# Patient Record
Sex: Female | Born: 1980 | Race: White | Hispanic: No | Smoking: Former smoker
Health system: Southern US, Community
[De-identification: ages and names within clinical notes are randomized; demographics above are authoritative.]

## PROBLEM LIST (undated history)

## (undated) DIAGNOSIS — M545 Low back pain: Secondary | ICD-10-CM

## (undated) DIAGNOSIS — Z8719 Personal history of other diseases of the digestive system: Secondary | ICD-10-CM

## (undated) DIAGNOSIS — G4730 Sleep apnea, unspecified: Secondary | ICD-10-CM

## (undated) DIAGNOSIS — F419 Anxiety disorder, unspecified: Secondary | ICD-10-CM

## (undated) DIAGNOSIS — E559 Vitamin D deficiency, unspecified: Secondary | ICD-10-CM

## (undated) DIAGNOSIS — Z973 Presence of spectacles and contact lenses: Secondary | ICD-10-CM

## (undated) DIAGNOSIS — R52 Pain, unspecified: Secondary | ICD-10-CM

## (undated) DIAGNOSIS — G510 Bell's palsy: Secondary | ICD-10-CM

## (undated) DIAGNOSIS — I639 Cerebral infarction, unspecified: Secondary | ICD-10-CM

## (undated) DIAGNOSIS — M25561 Pain in right knee: Secondary | ICD-10-CM

## (undated) DIAGNOSIS — F329 Major depressive disorder, single episode, unspecified: Secondary | ICD-10-CM

## (undated) DIAGNOSIS — G4440 Drug-induced headache, not elsewhere classified, not intractable: Secondary | ICD-10-CM

## (undated) DIAGNOSIS — M797 Fibromyalgia: Secondary | ICD-10-CM

## (undated) DIAGNOSIS — F5104 Psychophysiologic insomnia: Secondary | ICD-10-CM

## (undated) DIAGNOSIS — M5432 Sciatica, left side: Secondary | ICD-10-CM

## (undated) DIAGNOSIS — R569 Unspecified convulsions: Secondary | ICD-10-CM

## (undated) DIAGNOSIS — K589 Irritable bowel syndrome without diarrhea: Secondary | ICD-10-CM

## (undated) DIAGNOSIS — K219 Gastro-esophageal reflux disease without esophagitis: Secondary | ICD-10-CM

## (undated) DIAGNOSIS — G43909 Migraine, unspecified, not intractable, without status migrainosus: Secondary | ICD-10-CM

## (undated) DIAGNOSIS — N83201 Unspecified ovarian cyst, right side: Secondary | ICD-10-CM

## (undated) DIAGNOSIS — N6012 Diffuse cystic mastopathy of left breast: Secondary | ICD-10-CM

## (undated) DIAGNOSIS — N6011 Diffuse cystic mastopathy of right breast: Secondary | ICD-10-CM

## (undated) DIAGNOSIS — G43009 Migraine without aura, not intractable, without status migrainosus: Secondary | ICD-10-CM

## (undated) DIAGNOSIS — M1990 Unspecified osteoarthritis, unspecified site: Secondary | ICD-10-CM

## (undated) DIAGNOSIS — G8929 Other chronic pain: Secondary | ICD-10-CM

## (undated) HISTORY — DX: Unspecified osteoarthritis, unspecified site: M19.90

## (undated) HISTORY — PX: ENDOMETRIAL ABLATION: SHX621

## (undated) HISTORY — DX: Vitamin D deficiency, unspecified: E55.9

## (undated) HISTORY — DX: Other chronic pain: G89.29

## (undated) HISTORY — PX: MOUTH BIOPSY: SHX1012

## (undated) HISTORY — DX: Pain, unspecified: R52

## (undated) HISTORY — DX: Anxiety disorder, unspecified: F41.9

## (undated) HISTORY — DX: Migraine, unspecified, not intractable, without status migrainosus: G43.909

## (undated) HISTORY — DX: Low back pain: M54.5

## (undated) HISTORY — DX: Diffuse cystic mastopathy of right breast: N60.11

## (undated) HISTORY — DX: Psychophysiologic insomnia: F51.04

## (undated) HISTORY — DX: Drug-induced headache, not elsewhere classified, not intractable: G44.40

## (undated) HISTORY — PX: TUBAL LIGATION: SHX77

## (undated) HISTORY — DX: Fibromyalgia: M79.7

## (undated) HISTORY — DX: Major depressive disorder, single episode, unspecified: F32.9

## (undated) HISTORY — DX: Migraine without aura, not intractable, without status migrainosus: G43.009

## (undated) HISTORY — PX: MOUTH SURGERY: SHX715

## (undated) HISTORY — DX: Irritable bowel syndrome, unspecified: K58.9

## (undated) HISTORY — DX: Unspecified convulsions: R56.9

## (undated) HISTORY — DX: Unspecified ovarian cyst, right side: N83.201

## (undated) HISTORY — DX: Diffuse cystic mastopathy of left breast: N60.12

## (undated) HISTORY — PX: MULTIPLE TOOTH EXTRACTIONS: SHX2053

---

## 1999-11-02 HISTORY — PX: CHOLECYSTECTOMY: SHX55

## 2000-11-01 HISTORY — PX: ESOPHAGOGASTRODUODENOSCOPY: SHX1529

## 2001-05-09 ENCOUNTER — Encounter: Payer: Self-pay | Admitting: Family Medicine

## 2001-05-09 ENCOUNTER — Ambulatory Visit (HOSPITAL_COMMUNITY): Admission: RE | Admit: 2001-05-09 | Discharge: 2001-05-09 | Payer: Self-pay | Admitting: Family Medicine

## 2002-11-15 ENCOUNTER — Ambulatory Visit (HOSPITAL_COMMUNITY): Admission: EM | Admit: 2002-11-15 | Discharge: 2002-11-15 | Payer: Self-pay | Admitting: Emergency Medicine

## 2003-01-14 ENCOUNTER — Inpatient Hospital Stay (HOSPITAL_COMMUNITY): Admission: RE | Admit: 2003-01-14 | Discharge: 2003-01-16 | Payer: Self-pay | Admitting: Obstetrics and Gynecology

## 2005-02-25 ENCOUNTER — Ambulatory Visit (HOSPITAL_COMMUNITY): Admission: RE | Admit: 2005-02-25 | Discharge: 2005-02-25 | Payer: Self-pay | Admitting: Family Medicine

## 2006-04-14 ENCOUNTER — Emergency Department (HOSPITAL_COMMUNITY): Admission: EM | Admit: 2006-04-14 | Discharge: 2006-04-14 | Payer: Self-pay | Admitting: Emergency Medicine

## 2006-04-26 ENCOUNTER — Encounter (HOSPITAL_COMMUNITY): Admission: RE | Admit: 2006-04-26 | Discharge: 2006-05-26 | Payer: Self-pay | Admitting: Orthopaedic Surgery

## 2006-04-28 ENCOUNTER — Ambulatory Visit (HOSPITAL_COMMUNITY): Admission: RE | Admit: 2006-04-28 | Discharge: 2006-04-28 | Payer: Self-pay | Admitting: Orthopaedic Surgery

## 2006-05-11 ENCOUNTER — Ambulatory Visit (HOSPITAL_COMMUNITY): Admission: RE | Admit: 2006-05-11 | Discharge: 2006-05-11 | Payer: Self-pay | Admitting: *Deleted

## 2006-05-11 ENCOUNTER — Ambulatory Visit (HOSPITAL_COMMUNITY): Admission: RE | Admit: 2006-05-11 | Discharge: 2006-05-11 | Payer: Self-pay | Admitting: Obstetrics and Gynecology

## 2006-12-23 ENCOUNTER — Ambulatory Visit (HOSPITAL_COMMUNITY): Admission: RE | Admit: 2006-12-23 | Discharge: 2006-12-23 | Payer: Self-pay | Admitting: Obstetrics and Gynecology

## 2006-12-23 ENCOUNTER — Encounter (INDEPENDENT_AMBULATORY_CARE_PROVIDER_SITE_OTHER): Payer: Self-pay | Admitting: *Deleted

## 2010-04-08 ENCOUNTER — Other Ambulatory Visit: Admission: RE | Admit: 2010-04-08 | Discharge: 2010-04-08 | Payer: Self-pay | Admitting: Obstetrics and Gynecology

## 2011-03-19 NOTE — H&P (Signed)
   NAME:  Mary Parker, Mary Parker                           ACCOUNT NO.:  [id number]   MEDICAL RECORD NO.:  [id number]                   PATIENT TYPE:  AMB   LOCATION:  DAY                                  FACILITY:  [facility]   PHYSICIAN:  Tilda Burrow, M.D.              DATE OF BIRTH:  06-09-1981   DATE OF ADMISSION:  01/14/2003  DATE OF DISCHARGE:                                HISTORY & PHYSICAL   ADMISSION DIAGNOSES:  1. Pregnancy at 39 weeks' gestation.  2. Prior cesarean section x2 not for trial of labor.  Plan is for repeat     cesarean section with wide-excision of cicatrix.   HISTORY OF PRESENT ILLNESS:  This 30 year old female, G3, P2, with two prior  cesarean sections, is scheduled at this time for repeat cesarean section.  She does not wish to have tubal ligation at this time.  Plans are for a  repeat cesarean with wide-excision of cicatrix on January 14, 2003.  Gestational age is considered 38-5/7 weeks by ultrasound criteria, 40 weeks  by LMP.   PAST MEDICAL HISTORY:  Benign.   PAST SURGICAL HISTORY:  Two prior cesarean sections with spinal anesthesia  without sequelae.   ALLERGIES:  SEASONAL ALLERGIES only.   FAMILY HISTORY:  Positive for hypertension, breast cancer and ovarian  cancer.   HABITS:  Smoked 1 to 1-1/2 packs per day prior to pregnancy, 1/2 pack per  day during pregnancy.  Alcohol and recreational drugs denied.   PHYSICAL EXAMINATION:  VITAL SIGNS:  Height 5 feet 6 inches, weight 183  pounds with a two pound weight gain this pregnancy.  Blood pressure 120/70.  HEENT:  Pupils equal round and reactive, extraocular movements intact.  NECK:  Supple.  CHEST:  Clear to auscultation.  ABDOMEN:  36 cm fundal height with the baby having already dropped.  PELVIC:  Vertex is firmly applied to the lower uterine segment and pelvic  inlet.  Vertex presentation confirmed.  Fetal heart rate 138.  Fetal  movement present.  Cervical exam deferred.   PRENATAL LABORATORY DATA:   Blood type O positive, hemoglobin 12, hematocrit  38.  MSAFP normal.  Rubella immune.  RPR nonreactive.  Urine culture  negative.  Hepatitis and HIV negative.  Glucose tolerance test normal with  MSAFP normal at 1:1500 risk.    ASSESSMENT:  Pregnancy at 38-5/7 weeks.   PLAN:  Repeat cesarean section with wide-excision of cicatrix planned for  January 14, 2003.                                               Tilda Burrow, M.D.    JVF/MEDQ  D:  01/11/2003  T:  01/11/2003  Job:  [id number]

## 2011-03-19 NOTE — Op Note (Signed)
NAME:  Tenpas, DARIYAH GARDUNO                           ACCOUNT NO.:  [id number]   MEDICAL RECORD NO.:  [id number]                   PATIENT TYPE:  AMB   LOCATION:  DAY                                  FACILITY:  [facility]   PHYSICIAN:  Tilda Burrow, M.D.              DATE OF BIRTH:  Mar 28, 1981   DATE OF PROCEDURE:  DATE OF DISCHARGE:                                 OPERATIVE REPORT   PREOPERATIVE DIAGNOSES:  Intrauterine pregnancy 39 weeks, prior C-section  after trial of labor.   POSTOPERATIVE DIAGNOSES:  Intrauterine pregnancy 39 weeks, prior C-section  after trial of labor.   PROCEDURE:  1. Repeat low transverse cervical cesarean section.  2. Wide excision of cicatrix.   SURGEON:  Tilda Burrow, M.D.   ASSISTANT:  Ms. Angela Cox   ANESTHESIA:  Spinal.   COMPLICATIONS:  Inadvertent needle stick to assistant during subcu closure.   ESTIMATED BLOOD LOSS:  300 mL   FINDINGS:  Health female, Apgars 9 and 9, 7 pounds 4-1/2 ounces.   DESCRIPTION OF PROCEDURE:  The patient was taken to the operating room and  prepped and draped for abdominal procedure after spinal anesthesia  introduced and Foley catheter inserted.  A Pfannenstiel-type incision was  repeated, widely excising the old cicatrix,  removing it with approximately  a 20 cm ellipse of skin and cicatrix with underlying subcu fatty tissue.  Opening the fascia in standard Pfannenstiel technique was then performed  with easy entry of the abdomen through the midline.  A bladder flap was  developed on the lower uterine segment and a very thin area in the lower  uterine segment identified and opened, less than 5 mm thickness to the lower  uterine segment.  The infant was delivered easily using fundal pressure and  manual guidance of the vertex.  The infant was delivered and bulb suctioning  then performed.  The baby cried even before the abdomen was delivered.  Amniotic fluid was clear without malodor.  The infant was then  delivered to  the nurse in attendance for monitoring, Apgars of 9 and 9 were signed.  The  placenta was delivered easily after cord blood samples obtained and the  uterus irrigated with antibiotic solution.  A single layer of running  locking incision was then performed, followed by bladder flap  reapproximation with 2-0 chromic.  The anterior peritoneum was  reapproximated using 2-0 chromic.  The rectus muscles were pulled into the  midline with a single interrupted suture of 2-0 chromic over the bladder  flap area.  The fascia was trimmed of some lax fibrosis on the upper aspects  of the incision and this was discarded as well.  The rest of the fascia was  pulled together in the midline using continuous running #0 Vicryl with good  tissue approximation.  Subcutaneous tissues were then pulled together using  2-0 plain interrupted sutures.  Unfortunately during  the closure during one  of the stitches, after I had gone through the patient's tissue, then the  needle was exposed, Ms. Montolongo moved in such a way to have a very  superficial stick to the tip of her finger.  The glove was removed.  Needle  discarded, and no contamination of patient occurred.  Inspection of Ms.  Montolongo's finger tip indicated a very very tiny droplet of blood.  Standard needle stick precautions were initiated.  We then continued with  the procedure after reapproximating the fatty tissues with interrupted 2-0  plain and then staple closure of the skin completed the procedure.  The  patient tolerated the procedure well and went to recovery room in excellent  condition.  Estimated blood loss 300 cc.                                               Tilda Burrow, M.D.    JVF/MEDQ  D:  01/14/2003  T:  01/14/2003  Job:  [id number]

## 2011-03-19 NOTE — Discharge Summary (Signed)
   NAME:  Mary Parker, Mary Parker                           ACCOUNT NO.:  [id number]   MEDICAL RECORD NO.:  [id number]                   PATIENT TYPE:  INP   LOCATION:  A428                                 FACILITY:  [facility]   PHYSICIAN:  Tilda Burrow, M.D.              DATE OF BIRTH:  August 26, 1981   DATE OF ADMISSION:  01/14/2003  DATE OF DISCHARGE:  01/16/2003                                 DISCHARGE SUMMARY   ADMISSION DIAGNOSIS:  1. Intrauterine pregnancy at [redacted] weeks gestation.  2. Prior cesarean section x2, not for trial of labor.   DISCHARGE DIAGNOSES:  1. Pregnancy at 39 weeks, delivered.  2. Prior cesarean section x2, not for trial of labor.   PROCEDURE:  On January 14, 2003 repeat low transverse cervical cesarean  section with excision of old cicatrix.   FINDINGS:  Healthy female infant, Apgars 8 and 9, 7 pounds 4.5 ounces.   DISCHARGE INSTRUCTIONS:  Routine post C section instructions.   DISCHARGE MEDICATIONS:  1. Tylox one to two q.4h. p.r.n. pain.  2. Motrin 200 mg two q.4h. p.r.n. mild pain.   FOLLOW-UP:  My office for postpartum care.   HISTORY OF PRESENT ILLNESS:  This 30 year old gravida 3 para 2 was scheduled  for a repeat cesarean section.  She does not wish to have tubal ligation.  Plans are for repeat cesarean section with excision of cicatrix.   PAST MEDICAL HISTORY:  Benign.   SURGICAL HISTORY:  Two prior C sections with spinal anesthesia.   PHYSICAL EXAMINATION:  Height 5 feet 6 inches, weight 183 pounds.  Blood  pressure 120/70.  Fundal height 36 cm.    HOSPITAL COURSE:  The hospital course was uneventful.  The patient was  stable on postoperative day #1, was planned for discharge on postoperative  day #2.  Discharge hemoglobin was 11.8, hematocrit 34.5, compared to 11.9  and 34.3 on admission.  Maternal blood type is  O positive.  Follow up will be in five days at our office for staple  removal.                                               Tilda Burrow, M.D.    JVF/MEDQ  D:  03/11/2003  T:  03/12/2003  Job:  [id number]

## 2011-03-19 NOTE — Op Note (Signed)
NAME:  Mary Parker, Mary Parker                 ACCOUNT NO.:  [id number]   MEDICAL RECORD NO.:  [id number]          PATIENT TYPE:  AMB   LOCATION:  DAY                           FACILITY:  [facility]   PHYSICIAN:  Tilda Burrow, M.D. DATE OF BIRTH:  09/29/1981   DATE OF PROCEDURE:  12/23/2006  DATE OF DISCHARGE:                               OPERATIVE REPORT   PREOPERATIVE DIAGNOSIS:  Elective sterilization, menorrhagia.   POSTOPERATIVE DIAGNOSIS:  Elective sterilization, menorrhagia.   PROCEDURE:  Hysteroscopy, dilation and curettage, endometrial ablation,  laparoscopic tubal sterilization with Falope rings.   INDICATIONS FOR PROCEDURE:  Patient with heavy menses desiring  resolution of her menstrual discomfort.   DETAILS OF PROCEDURE:  The patient was taken to the operating room,  prepped and draped in the usual fashion for combined abdominal and  vaginal procedure with low lithotomy leg support.  The cervix was  grasped and sounded to 9 cm and the cervix dilated to 27 [place] allowing  introduction of the 30 degree operative hysteroscope without difficulty.  The uterus was visualized and the endometrial cavity showed a very thin  lining.  A sharp curettage was performed in all quadrants which obtained  very, very little tissue.  Repeat hysteroscopy was performed confirming  that there was no significant loose debris in the uterine cavity and  that the lining of the uterus was thin.  It was noted during the  procedure that the uterus was quite flexible and easily distended.  It  appeared that the distention was greater than the normal amount of  distention for this procedure.  We then proceeded with preparation of  the Gynecare ThermaChoice III endometrial ablation balloon which was  inserted to the appropriate depth, 9 cm, and then dilated with 38 mL of  saline.  We had technical challenges getting the uterine tone to remain  strong, the uterus pressure kept decreasing as the uterus stretched  and  relaxed further and further.  We tried some IV oxytocin infusion to see  if that would help and it raised the uterine tone pressure by about 5  mmHg.  We then finally reached 150 mmHg pressure at 38 mL.  The  temperature sequence was initiated, heating element reaching 87 degrees  Centigrade, and thermal ablation sequence activated and processed.  We  then retrieved all 38 mL of fluid and placed a paracervical block with  0.5% Marcaine with epinephrine x20 mL.   The Hulka tenaculum was attached to the cervix and fallopian tube  sterilization performed.  An infraumbilical vertical 1 cm skin incision  was made as well as a transverse suprapubic incision.  The Veress needle  was introduced through the umbilicus being careful to orient toward the  pelvis while elevating the abdominal wall.  Pneumoperitoneum was easily  achieved.  The 5 mm laparoscopic trocar was introduced and inspection of  the pelvis revealed normal pelvic anatomy.  The uterus could be  manipulated sufficiently to grasp the tubes.  Each one had a Falope ring  applied at its mid portion and then percutaneous Marcaine solution  injected into  the tubal incarcerated portion and into the broad ligament  beneath the Falope ring.  The procedure was successfully completed, the  abdomen deflated with saline instilled in the abdomen x60 mL, removal of  the laparoscopic instruments from the suprapubic port and the umbilical  port, and then we allowed the patient to go to the recovery room in  stable condition.  Sponge and needle counts were correct.      Tilda Burrow, M.D.  Electronically Signed     JVF/MEDQ  D:  12/28/2006  T:  12/28/2006  Job:  [id number]

## 2011-03-19 NOTE — H&P (Signed)
NAME:  Mary Parker, Mary Parker                 ACCOUNT NO.:  [id number]   MEDICAL RECORD NO.:  [id number]          PATIENT TYPE:  AMB   LOCATION:  DAY                           FACILITY:  [facility]   PHYSICIAN:  Tilda Burrow, M.D. DATE OF BIRTH:  1981-08-18   DATE OF ADMISSION:  DATE OF DISCHARGE:  LH                              HISTORY & PHYSICAL   HISTORY AND PHYSICAL   This 30 year old female, gravida 3, para 3, status post C-section times  two, last menstrual period December 19, 2006 desires hysteroscopy,  dilatation and curettage and endometrial ablation to resolve persistent  heavy menses that are debilitating the patient.  She has been seen in  our office, discussed options of resolution of her heavy menses.  She  discussed a consideration of hysterectomy because of a family history of  cancer of the cervix in both the grandmother and an aunt, but she has a  long history of completely normal Pap smears.  She has menses that last  3 days with clots that she misses 1-2 days due to the cramping.  She has  no premenstrual cramps, they are all during the labor itself.  She has  had three C-sections and has a relatively tiny cervix.  It is felt that  the discomfort is related to the menstrual flow through the tight  undeveloped cervix that has never delivered an infant.  Ultrasound has  been performed, which shows the anteflexed uterus with fluid that  remains in the uterus at the end of the menses consistent with a tight  cervix.  Therefore the plans are to proceed with endometrial ablation  for resolution of her undesired heavy bleeding.  Also, plans are for  permanent sterilization at the time to eliminate the risk of ectopic  pregnancy or intrauterine pregnancy.   PAST MEDICAL HISTORY:  Benign.   PHYSICAL EXAMINATION:  Height 5-6, weight 199.  Blood pressure 114/76.  Pupils equal, round and reactive.  Neck supple.  Chest clear to  auscultation.  Cardiovascular exam unremarkable.  Breast  exam deferred.  Abdomen:  Well-healed lower abdominal scars from C-sections.  External  genitalia normal.  Nulliparous vaginal exam, normal, tiny cervix never  delivered vaginally.  Uterus anteflexed, small, with ultrasound  confirmation of uterine length of approximately 7-1/2 cm, anteflexed  uterus 4.5 x 3.4 cm.  Adnexae without masses or ultrasound-identified  masses.   PLAN:  Hysteroscopy, dilatation and curettage, endometrial ablation  followed by tubal ligation on December 23, 2006.      Tilda Burrow, M.D.  Electronically Signed     JVF/MEDQ  D:  12/23/2006  T:  12/23/2006  Job:  [id number]   cc:   Treating OB/GYN

## 2014-11-18 ENCOUNTER — Other Ambulatory Visit: Payer: Self-pay | Admitting: Adult Health

## 2014-11-28 ENCOUNTER — Other Ambulatory Visit: Payer: Self-pay | Admitting: Adult Health

## 2014-12-11 ENCOUNTER — Encounter: Payer: Self-pay | Admitting: Adult Health

## 2014-12-11 ENCOUNTER — Ambulatory Visit (INDEPENDENT_AMBULATORY_CARE_PROVIDER_SITE_OTHER): Payer: BLUE CROSS/BLUE SHIELD | Admitting: Adult Health

## 2014-12-11 ENCOUNTER — Other Ambulatory Visit (HOSPITAL_COMMUNITY)
Admission: RE | Admit: 2014-12-11 | Discharge: 2014-12-11 | Disposition: A | Discharge status: Home / Self Care | Payer: BLUE CROSS/BLUE SHIELD | Source: Ambulatory Visit | Attending: Adult Health | Admitting: Adult Health

## 2014-12-11 VITALS — BP 110/78 | HR 80 | Ht 63.0 in | Wt 170.5 lb

## 2014-12-11 DIAGNOSIS — R52 Pain, unspecified: Secondary | ICD-10-CM

## 2014-12-11 DIAGNOSIS — Z1151 Encounter for screening for human papillomavirus (HPV): Secondary | ICD-10-CM | POA: Insufficient documentation

## 2014-12-11 DIAGNOSIS — Z01419 Encounter for gynecological examination (general) (routine) without abnormal findings: Secondary | ICD-10-CM | POA: Diagnosis present

## 2014-12-11 DIAGNOSIS — F329 Major depressive disorder, single episode, unspecified: Secondary | ICD-10-CM | POA: Insufficient documentation

## 2014-12-11 DIAGNOSIS — F32A Depression, unspecified: Secondary | ICD-10-CM

## 2014-12-11 HISTORY — DX: Pain, unspecified: R52

## 2014-12-11 HISTORY — DX: Depression, unspecified: F32.A

## 2014-12-11 MED ORDER — SERTRALINE HCL 50 MG PO TABS: 50.0000 mg | ORAL_TABLET | Freq: Every day | ORAL | Status: DC
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
## 2014-12-11 NOTE — Patient Instructions (Signed)

## 2014-12-13 LAB — CYTOLOGY - PAP

## 2014-12-19 ENCOUNTER — Encounter: Payer: Self-pay | Admitting: Neurology

## 2014-12-19 ENCOUNTER — Ambulatory Visit (INDEPENDENT_AMBULATORY_CARE_PROVIDER_SITE_OTHER): Payer: BLUE CROSS/BLUE SHIELD | Admitting: Neurology

## 2014-12-19 VITALS — BP 104/70 | HR 75 | Ht 63.0 in | Wt 170.0 lb

## 2014-12-19 DIAGNOSIS — G43109 Migraine with aura, not intractable, without status migrainosus: Secondary | ICD-10-CM | POA: Insufficient documentation

## 2014-12-19 DIAGNOSIS — M797 Fibromyalgia: Secondary | ICD-10-CM

## 2014-12-19 DIAGNOSIS — G4440 Drug-induced headache, not elsewhere classified, not intractable: Secondary | ICD-10-CM

## 2014-12-19 DIAGNOSIS — G43009 Migraine without aura, not intractable, without status migrainosus: Secondary | ICD-10-CM

## 2014-12-19 DIAGNOSIS — G43019 Migraine without aura, intractable, without status migrainosus: Secondary | ICD-10-CM

## 2014-12-19 HISTORY — DX: Fibromyalgia: M79.7

## 2014-12-19 HISTORY — DX: Drug-induced headache, not elsewhere classified, not intractable: G44.40

## 2014-12-19 HISTORY — DX: Migraine without aura, not intractable, without status migrainosus: G43.009

## 2014-12-19 MED ORDER — NORTRIPTYLINE HCL 10 MG PO CAPS: ORAL_CAPSULE | ORAL | Status: DC
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
## 2014-12-19 NOTE — Patient Instructions (Signed)
  Pamelor (nortriptyline) is an antidepressant medication that has many uses that may include headache, whiplash injuries, or for peripheral neuropathy pain. Side effects may include drowsiness, dry mouth, blurred vision, or constipation. As with any antidepressant medication, worsening depression may occur. If you had any significant side effects, please call our office. The full effects of this medication may take 7-10 days after starting the drug, or going up on the dose.  Fibromyalgia Fibromyalgia is a disorder that is often misunderstood. It is associated with muscular pains and tenderness that comes and goes. It is often associated with fatigue and sleep disturbances. Though it tends to be long-lasting, fibromyalgia is not life-threatening. CAUSES  The exact cause of fibromyalgia is unknown. People with certain gene types are predisposed to developing fibromyalgia and other conditions. Certain factors can play a role as triggers, such as:  Spine disorders.  Arthritis.  Severe injury (trauma) and other physical stressors.  Emotional stressors. SYMPTOMS   The main symptom is pain and stiffness in the muscles and joints, which can vary over time.  Sleep and fatigue problems. Other related symptoms may include:  Bowel and bladder problems.  Headaches.  Visual problems.  Problems with odors and noises.  Depression or mood changes.  Painful periods (dysmenorrhea).  Dryness of the skin or eyes. DIAGNOSIS  There are no specific tests for diagnosing fibromyalgia. Patients can be diagnosed accurately from the specific symptoms they have. The diagnosis is made by determining that nothing else is causing the problems. TREATMENT  There is no cure. Management includes medicines and an active, healthy lifestyle. The goal is to enhance physical fitness, decrease pain, and improve sleep. HOME CARE INSTRUCTIONS   Only take over-the-counter or prescription medicines as directed by your  caregiver. Sleeping pills, tranquilizers, and pain medicines may make your problems worse.  Low-impact aerobic exercise is very important and advised for treatment. At first, it may seem to make pain worse. Gradually increasing your tolerance will overcome this feeling.  Learning relaxation techniques and how to control stress will help you. Biofeedback, visual imagery, hypnosis, muscle relaxation, yoga, and meditation are all options.  Anti-inflammatory medicines and physical therapy may provide short-term help.  Acupuncture or massage treatments may help.  Take muscle relaxant medicines as suggested by your caregiver.  Avoid stressful situations.  Plan a healthy lifestyle. This includes your diet, sleep, rest, exercise, and friends.  Find and practice a hobby you enjoy.  Join a fibromyalgia support group for interaction, ideas, and sharing advice. This may be helpful. SEEK MEDICAL CARE IF:  You are not having good results or improvement from your treatment. FOR MORE INFORMATION  National Fibromyalgia Association: www.fmaware.org Arthritis Foundation: www.arthritis.org Document Released: 10/18/2005 Document Revised: 01/10/2012 Document Reviewed: 01/28/2010 [facility] Patient Information 2015 [employer], [place]. This information is not intended to replace advice given to you by your health care provider. Make sure you discuss any questions you have with your health care provider.

## 2014-12-20 ENCOUNTER — Telehealth: Payer: Self-pay | Admitting: Neurology

## 2014-12-20 NOTE — Telephone Encounter (Signed)
I called the patient again. The home number listed is incorrect, and the patient does not answer the cell number. I will try again later, but we may need to send her a letter.

## 2014-12-20 NOTE — Telephone Encounter (Signed)
Try to call the patient, no answer. Vitamin B12 level is low, will try to have the patient come in for a B12 shot, go on oral supplementation.

## 2014-12-20 NOTE — Telephone Encounter (Signed)
I called the patient again, unable to reach her, I will call later.

## 2014-12-21 LAB — SPECIMEN STATUS REPORT

## 2014-12-21 LAB — RPR: RPR: NONREACTIVE

## 2014-12-21 LAB — SEDIMENTATION RATE: Sed Rate: 4 mm/hr (ref 0–32)

## 2014-12-21 LAB — VITAMIN B12: Vitamin B-12: 161 pg/mL — ABNORMAL LOW (ref 211–946)

## 2014-12-21 LAB — COPPER, SERUM: Copper: 104 ug/dL (ref 72–166)

## 2014-12-21 LAB — METHYLMALONIC ACID, SERUM: Methylmalonic Acid: 164 nmol/L (ref 0–378)

## 2014-12-23 ENCOUNTER — Encounter: Payer: Self-pay | Admitting: Neurology

## 2014-12-23 ENCOUNTER — Telehealth: Payer: Self-pay | Admitting: Neurology

## 2014-12-23 NOTE — Telephone Encounter (Signed)
Called patient again, still unable to contact her. I will write a letter. Also, will she has been contacted several times trying to set up the MRI study, unable to reach her.

## 2014-12-23 NOTE — Telephone Encounter (Signed)
I have made two attempts to contact this patient to schedule MRI and her voicemail is not set up

## 2014-12-30 ENCOUNTER — Telehealth: Payer: Self-pay | Admitting: *Deleted

## 2014-12-30 NOTE — Telephone Encounter (Signed)
I have tried to call patient but the voice mail has not been set up yet.

## 2014-12-30 NOTE — Telephone Encounter (Signed)
Patient wants to know how many B12 injections is she going to need and she also would like to know if this is something her PCP can give. This would be cheaper and they have late hours on Tuesdays and Thursday. Please call the patient

## 2014-12-31 ENCOUNTER — Telehealth: Payer: Self-pay | Admitting: Neurology

## 2014-12-31 ENCOUNTER — Telehealth: Payer: Self-pay | Admitting: *Deleted

## 2014-12-31 NOTE — Telephone Encounter (Signed)
Patient is returning your call about B12.  She gets off work at 5:00.  Please call back.

## 2014-12-31 NOTE — Telephone Encounter (Signed)
Has stopped zoloft per A. Creta LevinStallings, I tried calling no voice mail

## 2014-12-31 NOTE — Telephone Encounter (Signed)
I  have tried to call the patient again and was unable to leave a message.  Please relay that she can go to her PCP to get a B12 injection and then she can take an over the counter B12 oral supplement.

## 2015-01-07 ENCOUNTER — Ambulatory Visit: Payer: BLUE CROSS/BLUE SHIELD | Admitting: Adult Health

## 2015-01-08 ENCOUNTER — Ambulatory Visit (INDEPENDENT_AMBULATORY_CARE_PROVIDER_SITE_OTHER): Payer: BLUE CROSS/BLUE SHIELD

## 2015-01-08 DIAGNOSIS — G43019 Migraine without aura, intractable, without status migrainosus: Secondary | ICD-10-CM

## 2015-01-08 DIAGNOSIS — M797 Fibromyalgia: Secondary | ICD-10-CM

## 2015-01-09 ENCOUNTER — Telehealth: Payer: Self-pay | Admitting: Neurology

## 2015-01-09 NOTE — Telephone Encounter (Signed)
    MRI brain results 01/09/2015:  IMPRESSION:  Normal MRI brain (without).

## 2015-01-09 NOTE — Telephone Encounter (Signed)
I tried to call the patient. Unable to reach, I will call back tomorrow.

## 2015-01-10 MED ORDER — NORTRIPTYLINE HCL 25 MG PO CAPS: 50.0000 mg | ORAL_CAPSULE | Freq: Every day | ORAL | Status: DC

## 2015-01-10 NOTE — Telephone Encounter (Signed)
I called the patient. MRI the brain was unremarkable. The patient is to go to her primary care doctor to get the B12 shot, and then go on vitamin B12 tablets 1000 g daily. She is still having lots of problems with headache, she is on 30 mg of nortriptyline at night, tolerating the medication well. I will call in a 25 mg tablet, we will start at 50 mg at night.

## 2015-01-14 ENCOUNTER — Telehealth: Payer: Self-pay | Admitting: Neurology

## 2015-01-14 MED ORDER — GABAPENTIN 300 MG PO CAPS: ORAL_CAPSULE | ORAL | Status: DC

## 2015-01-14 NOTE — Telephone Encounter (Signed)
I called patient. She is not tolerating the nortriptyline secondary to reflux type symptoms. We will switch her over to gabapentin for the fibromyalgia and for the headaches.

## 2015-01-14 NOTE — Telephone Encounter (Signed)
Patient is calling in regard to Rx Nortriptyline 25 mg.  Patient needs a medication that will allow her to be in sunlight and Rx is not really helping her that much...today she has had a lot of pain.  The higher dosage is making her burp and burn up her nose. Can another Rx be prescribed please.  She can also be paged at work [phone].

## 2015-03-06 ENCOUNTER — Telehealth: Payer: Self-pay | Admitting: Neurology

## 2015-03-06 MED ORDER — GABAPENTIN 600 MG PO TABS: 600.0000 mg | ORAL_TABLET | Freq: Three times a day (TID) | ORAL | Status: DC

## 2015-03-06 NOTE — Telephone Encounter (Signed)
Patient is calling because the gabapentin (NEURONTIN) 300 MG capsule is not helping with the pain in her back but is helping with her legs. What can be done to help with the back pain? The patient also wants to know if she can go back on the anti depressant medication Zoloft. Please call patient to discuss. Thank you.

## 2015-03-06 NOTE — Telephone Encounter (Signed)
I called the patient. The patient is on 900 mg daily of gabapentin, she is tolerating the medication well. We will go up to 600 mg twice daily for 2 weeks, then go to 600 mg 3 times daily. Okay for her to go back on the Zoloft.

## 2015-03-10 ENCOUNTER — Telehealth: Payer: Self-pay | Admitting: Adult Health

## 2015-03-10 NOTE — Telephone Encounter (Signed)
Left message x 1. JSY 

## 2015-03-12 NOTE — Telephone Encounter (Signed)
Left message x 2. JSY 

## 2015-03-13 NOTE — Telephone Encounter (Signed)
Left message x 3. JSY 

## 2015-03-17 NOTE — Telephone Encounter (Signed)
3 messages left and never heard from pt. Encounter closed. JSY

## 2015-03-19 ENCOUNTER — Telehealth: Payer: Self-pay | Admitting: *Deleted

## 2015-03-19 NOTE — Telephone Encounter (Signed)
LMVM for pt to call back re: rescheduling appt from 04-30-15 0830 to another time.

## 2015-03-20 NOTE — Telephone Encounter (Signed)
LMVM for pt to rescheduling appt.

## 2015-03-21 ENCOUNTER — Emergency Department (HOSPITAL_COMMUNITY)
Admission: EM | Admit: 2015-03-21 | Discharge: 2015-03-21 | Disposition: A | Discharge status: Home / Self Care | Payer: BLUE CROSS/BLUE SHIELD | Attending: Emergency Medicine | Admitting: Emergency Medicine

## 2015-03-21 ENCOUNTER — Encounter: Payer: Self-pay | Admitting: *Deleted

## 2015-03-21 ENCOUNTER — Encounter (HOSPITAL_COMMUNITY): Payer: Self-pay | Admitting: Emergency Medicine

## 2015-03-21 ENCOUNTER — Emergency Department (HOSPITAL_COMMUNITY): Payer: BLUE CROSS/BLUE SHIELD

## 2015-03-21 DIAGNOSIS — T50995A Adverse effect of other drugs, medicaments and biological substances, initial encounter: Secondary | ICD-10-CM | POA: Diagnosis not present

## 2015-03-21 DIAGNOSIS — Z79899 Other long term (current) drug therapy: Secondary | ICD-10-CM | POA: Diagnosis not present

## 2015-03-21 DIAGNOSIS — Z8739 Personal history of other diseases of the musculoskeletal system and connective tissue: Secondary | ICD-10-CM | POA: Insufficient documentation

## 2015-03-21 DIAGNOSIS — Z87448 Personal history of other diseases of urinary system: Secondary | ICD-10-CM | POA: Insufficient documentation

## 2015-03-21 DIAGNOSIS — F329 Major depressive disorder, single episode, unspecified: Secondary | ICD-10-CM | POA: Insufficient documentation

## 2015-03-21 DIAGNOSIS — R51 Headache: Secondary | ICD-10-CM | POA: Diagnosis present

## 2015-03-21 DIAGNOSIS — F419 Anxiety disorder, unspecified: Secondary | ICD-10-CM | POA: Insufficient documentation

## 2015-03-21 DIAGNOSIS — T50905A Adverse effect of unspecified drugs, medicaments and biological substances, initial encounter: Secondary | ICD-10-CM

## 2015-03-21 DIAGNOSIS — E559 Vitamin D deficiency, unspecified: Secondary | ICD-10-CM | POA: Diagnosis not present

## 2015-03-21 DIAGNOSIS — Z8719 Personal history of other diseases of the digestive system: Secondary | ICD-10-CM | POA: Diagnosis not present

## 2015-03-21 DIAGNOSIS — Z87891 Personal history of nicotine dependence: Secondary | ICD-10-CM | POA: Diagnosis not present

## 2015-03-21 DIAGNOSIS — G43911 Migraine, unspecified, intractable, with status migrainosus: Secondary | ICD-10-CM | POA: Insufficient documentation

## 2015-03-21 DIAGNOSIS — R519 Headache, unspecified: Secondary | ICD-10-CM

## 2015-03-21 LAB — BASIC METABOLIC PANEL
Anion gap: 4 — ABNORMAL LOW (ref 5–15)
BUN: 9 mg/dL (ref 6–20)
CALCIUM: 8.3 mg/dL — AB (ref 8.9–10.3)
CO2: 29 mmol/L (ref 22–32)
CREATININE: 0.81 mg/dL (ref 0.44–1.00)
Chloride: 108 mmol/L (ref 101–111)
GFR calc Af Amer: >60 mL/min (ref >60)
GFR calc non Af Amer: >60 mL/min (ref >60)
GLUCOSE: 85 mg/dL (ref 65–99)
Potassium: 3.6 mmol/L (ref 3.5–5.1)
SODIUM: 141 mmol/L (ref 135–145)

## 2015-03-21 LAB — CBC WITH DIFFERENTIAL/PLATELET
Basophils Absolute: 0 10*3/uL (ref 0.0–0.1)
Basophils Relative: 0 % (ref 0–1)
EOS ABS: 0.1 10*3/uL (ref 0.0–0.7)
EOS PCT: 2 % (ref 0–5)
HCT: 40.5 % (ref 36.0–46.0)
Hemoglobin: 13 g/dL (ref 12.0–15.0)
Lymphocytes Relative: 24 % (ref 12–46)
Lymphs Abs: 1.6 10*3/uL (ref 0.7–4.0)
MCH: 30.2 pg (ref 26.0–34.0)
MCHC: 32.1 g/dL (ref 30.0–36.0)
MCV: 94 fL (ref 78.0–100.0)
MONO ABS: 0.4 10*3/uL (ref 0.1–1.0)
Monocytes Relative: 6 % (ref 3–12)
NEUTROS PCT: 68 % (ref 43–77)
Neutro Abs: 4.7 10*3/uL (ref 1.7–7.7)
PLATELETS: 172 10*3/uL (ref 150–400)
RBC: 4.31 MIL/uL (ref 3.87–5.11)
RDW: 12.4 % (ref 11.5–15.5)
WBC: 6.8 10*3/uL (ref 4.0–10.5)

## 2015-03-21 MED ORDER — SODIUM CHLORIDE 0.9 % IV SOLN
INTRAVENOUS | Status: DC
  Administered 2015-03-21: 20:00:00 via INTRAVENOUS

## 2015-03-21 MED ORDER — PROMETHAZINE HCL 25 MG/ML IJ SOLN
12.5000 mg | Freq: Four times a day (QID) | INTRAMUSCULAR | Status: DC | PRN
  Administered 2015-03-21: 12.5 mg via INTRAVENOUS
  Filled 2015-03-21: qty 1

## 2015-03-21 MED ORDER — SODIUM CHLORIDE 0.9 % IV BOLUS (SEPSIS)
1000.0000 mL | Freq: Once | INTRAVENOUS | Status: AC
  Administered 2015-03-21: 1000 mL via INTRAVENOUS

## 2015-03-21 MED ORDER — DIPHENHYDRAMINE HCL 50 MG/ML IJ SOLN
25.0000 mg | Freq: Once | INTRAMUSCULAR | Status: AC
  Administered 2015-03-21: 25 mg via INTRAVENOUS
  Filled 2015-03-21: qty 1

## 2015-03-21 MED ORDER — KETOROLAC TROMETHAMINE 30 MG/ML IJ SOLN
30.0000 mg | Freq: Once | INTRAMUSCULAR | Status: AC
  Administered 2015-03-21: 30 mg via INTRAVENOUS
  Filled 2015-03-21: qty 1

## 2015-03-21 MED ORDER — DEXAMETHASONE SODIUM PHOSPHATE 4 MG/ML IJ SOLN
10.0000 mg | Freq: Once | INTRAMUSCULAR | Status: AC
Start: 2015-03-21 — End: 2015-03-21
  Administered 2015-03-21: 10 mg via INTRAVENOUS
  Filled 2015-03-21: qty 3

## 2015-03-21 NOTE — Discharge Instructions (Signed)
Recommend going home and sleeping. Return for any new or worse symptoms. If headache does not resolve over the next couple days follow back up with your primary care doctor. May need referral to neurology. Some information provided above.

## 2015-03-21 NOTE — ED Notes (Signed)
Pt reports headache,nausea x4 days. Pt reports family reports pt had "seziure" x2 days ago. Pt reports recently increased dose of chantix when symptoms began. nad noted. Pt reports light and sound sensitivity.

## 2015-03-21 NOTE — Telephone Encounter (Signed)
LMVM for pt that will cancel appt on 04-30-15 at 0830 due to schedule change.  Please call back to reschedule.  Will mail letter as well.  Done.

## 2015-03-21 NOTE — ED Notes (Signed)
ED Provider at patient bedside for assessment/update.   

## 2015-03-21 NOTE — ED Provider Notes (Addendum)
CSN: [id number]     Arrival date & time 03/21/15  1828 History   First MD Initiated Contact with Patient 03/21/15 1839     Chief Complaint  Patient presents with  . Headache     (Consider location/radiation/quality/duration/timing/severity/associated sxs/prior Treatment) Patient is a 34 y.o. female presenting with headaches. The history is provided by the patient.  Headache Associated symptoms: myalgias, nausea, photophobia, seizures and vomiting   Associated symptoms: no congestion, no diarrhea, no dizziness, no fever, no neck pain, no numbness and no weakness    patient presents with a complaint of 6 days of nausea and vomiting headache migraine-like, this headache is been present for 4 days. Patient does have a history of migraines. However this one is on the right side when normally her migraines are on the left. Associated with nausea and vomiting and some photophobia. No fevers. Patient was on Chantix that was stopped 3 days ago because they felt that the the symptoms may be related to side effects of the medicine. 2 days ago patient had seizure-like activity without loss of consciousness. Had shaking all over. No incontinence. Patient also has a history of fibromyalgia. Patient's headache is 10 out of 10. Predominantly right-sided. No fevers no neck stiffness.  Past Medical History  Diagnosis Date  . Anxiety   . Migraines   . Fibrocystic disease of both breasts   . Arthritis     both knees  . Depression 12/11/2014  . Body aches 12/11/2014  . Common migraine 12/19/2014  . Fibromyalgia 12/19/2014  . IBS (irritable bowel syndrome)   . Vitamin D deficiency   . Chronic insomnia   . Rebound headache 12/19/2014   Past Surgical History  Procedure Laterality Date  . Cesarean section  [phone]  . Cholecystectomy  2001  . Tubal ligation    . Endometrial ablation     Family History  Problem Relation Age of Onset  . Hypertension Mother   . Other Mother     abnormal cells; had  hyst  . Obesity Daughter   . Obesity Son   . Cancer Maternal Grandmother   . Osteoporosis Maternal Grandmother   . Cancer Maternal Grandfather   . Obesity Daughter   . Diabetes Father   . Hypertension Father   . Alcohol abuse Father    History  Substance Use Topics  . Smoking status: Former Smoker -- 0.00 packs/day for 0 years    Types: Cigarettes  . Smokeless tobacco: Never Used  . Alcohol Use: No   OB History    Gravida Para Term Preterm AB TAB SAB Ectopic Multiple Living   Review of Systems  Constitutional: Negative for fever.  HENT: Negative for congestion.   Eyes: Positive for photophobia.  Respiratory: Negative for shortness of breath.   Cardiovascular: Negative for chest pain.  Gastrointestinal: Positive for nausea and vomiting. Negative for diarrhea and abdominal distention.  Genitourinary: Negative for dysuria.  Musculoskeletal: Positive for myalgias. Negative for neck pain.  Skin: Negative for rash.  Neurological: Positive for seizures and headaches. Negative for dizziness, speech difficulty, weakness and numbness.  Hematological: Does not bruise/bleed easily.  Psychiatric/Behavioral: Negative for confusion.      Allergies  Ciprofloxacin  Home Medications   Prior to Admission medications   Medication Sig Start Date End Date Taking? Authorizing Provider  ALPRAZolam Prudy Feeler) 0.5 MG tablet Take 0.25-0.5 mg by mouth daily as needed for anxiety.  11/18/14  Yes Historical Provider, MD  aspirin-acetaminophen-caffeine (EXCEDRIN MIGRAINE) 209 [id number] 5100250-250-65 MG per tablet Take 2 tablets by mouth as needed for headache.   Yes Historical Provider, MD  cyanocobalamin (,VITAMIN B-12,) 1000 MCG/ML injection Inject 1,000 mcg into the muscle every 30 (thirty) days.  03/07/15  Yes Historical Provider, MD  gabapentin (NEURONTIN) 600 MG tablet Take 1 tablet (600 mg total) by mouth 3 (three) times daily. 03/06/15  Yes York Spanielharles K Willis, MD  ibuprofen (ADVIL,MOTRIN) 200 MG  tablet Take 200 mg by mouth. Takes 3000 mg per day.   Yes Historical Provider, MD  rizatriptan (MAXALT) 10 MG tablet Take 10 mg by mouth as needed for migraine.  03/07/15  Yes Historical Provider, MD  topiramate (TOPAMAX) 25 MG tablet Take 25 mg by mouth 2 (two) times daily. 03/07/15  Yes Historical Provider, MD  Vitamin D, Ergocalciferol, (DRISDOL) 50000 UNITS CAPS capsule 50,000 Units every 30 (thirty) days.  11/20/14  Yes Historical Provider, MD  varenicline (CHANTIX PAK) 0.5 MG X 11 & 1 MG X 42 tablet Take 0.5 mg by mouth 2 (two) times daily. Take one 0.5 mg tablet by mouth once daily for 3 days, then increase to one 0.5 mg tablet twice daily for 4 days, then increase to one 1 mg tablet twice daily.    Historical Provider, MD   BP 104/76 mmHg  Pulse 57  Temp(Src) 97.7 F (36.5 C) (Oral)  Resp 16  Ht 5\' 3"  (1.6 m)  Wt 179 lb (81.194 kg)  BMI 31.72 kg/m2  SpO2 100% Physical Exam  Constitutional: She is oriented to person, place, and time. She appears well-developed and well-nourished. No distress.  HENT:  Head: Normocephalic and atraumatic.  Eyes: Conjunctivae and EOM are normal. Pupils are equal, round, and reactive to light.  Neck: Normal range of motion.  Cardiovascular: Normal rate and regular rhythm.   No murmur heard. Pulmonary/Chest: Effort normal and breath sounds normal. No respiratory distress.  Abdominal: Soft. Bowel sounds are normal. There is no tenderness.  Musculoskeletal: Normal range of motion. She exhibits no edema.  Neurological: She is alert and oriented to person, place, and time. No cranial nerve deficit. She exhibits normal muscle tone. Coordination normal.  Skin: Skin is warm. No rash noted.  Nursing note and vitals reviewed.   ED Course  Procedures (including critical care time) Labs Review Labs Reviewed  BASIC METABOLIC PANEL - Abnormal; Notable for the following:    Calcium 8.3 (*)    Anion gap 4 (*)    All other components within normal limits  CBC WITH  DIFFERENTIAL/PLATELET   Results for orders placed or performed during the hospital encounter of 03/21/15  Basic metabolic panel  Result Value Ref Range   Sodium 141 135 - 145 mmol/L   Potassium 3.6 3.5 - 5.1 mmol/L   Chloride 108 101 - 111 mmol/L   CO2 29 22 - 32 mmol/L   Glucose, Bld 85 65 - 99 mg/dL   BUN 9 6 - 20 mg/dL   Creatinine, Ser 5.400.81 0.44 - 1.00 mg/dL   Calcium 8.3 (L) 8.9 - 10.3 mg/dL   GFR calc non Af Amer >60 >60 mL/min   GFR calc Af Amer >60 >60 mL/min   Anion gap 4 (L) 5 - 15  CBC with Differential/Platelet  Result Value Ref Range   WBC 6.8 4.0 - 10.5 K/uL   RBC 4.31 3.87 - 5.11 MIL/uL   Hemoglobin 13.0 12.0 - 15.0 g/dL   HCT 98.140.5 19.136.0 - 47.846.0 %  MCV 94.0 78.0 - 100.0 fL   MCH 30.2 26.0 - 34.0 pg   MCHC 32.1 30.0 - 36.0 g/dL   RDW 16.112.4 09.611.5 - 04.515.5 %   Platelets 172 150 - 400 K/uL   Neutrophils Relative % 68 43 - 77 %   Neutro Abs 4.7 1.7 - 7.7 K/uL   Lymphocytes Relative 24 12 - 46 %   Lymphs Abs 1.6 0.7 - 4.0 K/uL   Monocytes Relative 6 3 - 12 %   Monocytes Absolute 0.4 0.1 - 1.0 K/uL   Eosinophils Relative 2 0 - 5 %   Eosinophils Absolute 0.1 0.0 - 0.7 K/uL   Basophils Relative 0 0 - 1 %   Basophils Absolute 0.0 0.0 - 0.1 K/uL     Imaging Review Ct Head Wo Contrast  03/21/2015   CLINICAL DATA:  Frontal headache for 5 days.  EXAM: CT HEAD WITHOUT CONTRAST  TECHNIQUE: Contiguous axial images were obtained from the base of the skull through the vertex without intravenous contrast.  COMPARISON:  MRI 01/08/2015  FINDINGS: No acute intracranial abnormality. Specifically, no hemorrhage, hydrocephalus, mass lesion, acute infarction, or significant intracranial injury. No acute calvarial abnormality. Visualized paranasal sinuses and mastoids clear. Orbital soft tissues unremarkable.  IMPRESSION: Negative.   Electronically Signed   By: Charlett NoseKevin  Dover M.D.   On: 03/21/2015 21:00     EKG Interpretation None      MDM   Final diagnoses:  Headache  Intractable  migraine with status migrainosus, unspecified migraine type  Medication side effect, initial encounter    Patient with history of migraines and fibromyalgia. Patient was started on Chantix to help quit smoking. Patient started with some nausea and vomiting about 6  days ago. Started with a headache about 4 days ago. Patient's Chantix. 3 days ago. Headache is predominantly right-sided normally her migraine headaches are left-sided but other than that the characteristics of a headache very similar to her past migraines. Patient had a shaking episode that occurred 2 days ago no loss of consciousness associated with it. None since. Patient still does not feel well headache is 10 out of 10. Associated with some photophobia as stated nausea and vomiting no fevers no focal neuro deficits. Patient had an MRI of her brain in about 4 months ago that was negative.  Today's labs without any snig abnormalities. Patient treated with a migraine cocktail. Head CT is pending was ordered to rule out any acute bleed.  Clinically suspect that most of the symptoms were brought on due to the Chantix and side effects of that. Part of this is also probably a exacerbation of her baseline tendency towards migraines. Patient has been off the medication now for 3 days and would expect that the symptoms of it should be almost completely out of her system.    Vanetta MuldersScott Rayansh Herbst, MD 03/21/15 2108  Head CT and labs without any sniffing abnormalities. Will discharge patient home with follow-up with her primary care doctor referral to neurology as needed. Patient will return for any new or worse symptoms. Patient nontoxic no acute distress. Patient will be given 30 mg of Toradol IV prior to discharge. Since head CT shows no evidence of any bleed.  Vanetta MuldersScott Raymone Pembroke, MD 03/21/15 2112  Vanetta MuldersScott Dara Beidleman, MD 03/21/15 2112

## 2015-03-23 ENCOUNTER — Emergency Department (HOSPITAL_COMMUNITY)
Admission: EM | Admit: 2015-03-23 | Discharge: 2015-03-23 | Disposition: A | Discharge status: Home / Self Care | Payer: BLUE CROSS/BLUE SHIELD | Attending: Emergency Medicine | Admitting: Emergency Medicine

## 2015-03-23 ENCOUNTER — Encounter (HOSPITAL_COMMUNITY): Payer: Self-pay | Admitting: Emergency Medicine

## 2015-03-23 DIAGNOSIS — Z79899 Other long term (current) drug therapy: Secondary | ICD-10-CM | POA: Diagnosis not present

## 2015-03-23 DIAGNOSIS — M797 Fibromyalgia: Secondary | ICD-10-CM | POA: Insufficient documentation

## 2015-03-23 DIAGNOSIS — Z87891 Personal history of nicotine dependence: Secondary | ICD-10-CM | POA: Insufficient documentation

## 2015-03-23 DIAGNOSIS — G4700 Insomnia, unspecified: Secondary | ICD-10-CM | POA: Insufficient documentation

## 2015-03-23 DIAGNOSIS — Z8742 Personal history of other diseases of the female genital tract: Secondary | ICD-10-CM | POA: Diagnosis not present

## 2015-03-23 DIAGNOSIS — E559 Vitamin D deficiency, unspecified: Secondary | ICD-10-CM | POA: Diagnosis not present

## 2015-03-23 DIAGNOSIS — G43809 Other migraine, not intractable, without status migrainosus: Secondary | ICD-10-CM | POA: Diagnosis not present

## 2015-03-23 DIAGNOSIS — F329 Major depressive disorder, single episode, unspecified: Secondary | ICD-10-CM | POA: Insufficient documentation

## 2015-03-23 DIAGNOSIS — G43909 Migraine, unspecified, not intractable, without status migrainosus: Secondary | ICD-10-CM | POA: Diagnosis present

## 2015-03-23 DIAGNOSIS — M542 Cervicalgia: Secondary | ICD-10-CM | POA: Insufficient documentation

## 2015-03-23 DIAGNOSIS — M25512 Pain in left shoulder: Secondary | ICD-10-CM | POA: Insufficient documentation

## 2015-03-23 DIAGNOSIS — F419 Anxiety disorder, unspecified: Secondary | ICD-10-CM | POA: Insufficient documentation

## 2015-03-23 DIAGNOSIS — Z8719 Personal history of other diseases of the digestive system: Secondary | ICD-10-CM | POA: Insufficient documentation

## 2015-03-23 MED ORDER — SODIUM CHLORIDE 0.9 % IV BOLUS (SEPSIS)
1000.0000 mL | Freq: Once | INTRAVENOUS | Status: AC
  Administered 2015-03-23: 1000 mL via INTRAVENOUS

## 2015-03-23 MED ORDER — KETOROLAC TROMETHAMINE 30 MG/ML IJ SOLN
30.0000 mg | Freq: Once | INTRAMUSCULAR | Status: AC
  Administered 2015-03-23: 30 mg via INTRAVENOUS
  Filled 2015-03-23: qty 1

## 2015-03-23 MED ORDER — ONDANSETRON HCL 4 MG PO TABS: 4.0000 mg | ORAL_TABLET | Freq: Three times a day (TID) | ORAL | Status: DC | PRN

## 2015-03-23 MED ORDER — METOCLOPRAMIDE HCL 5 MG/ML IJ SOLN
10.0000 mg | Freq: Once | INTRAMUSCULAR | Status: AC
  Administered 2015-03-23: 10 mg via INTRAVENOUS
  Filled 2015-03-23: qty 2

## 2015-03-23 MED ORDER — METOCLOPRAMIDE HCL 10 MG PO TABS: 10.0000 mg | ORAL_TABLET | Freq: Four times a day (QID) | ORAL | Status: DC

## 2015-03-23 MED ORDER — DIPHENHYDRAMINE HCL 50 MG/ML IJ SOLN
25.0000 mg | Freq: Once | INTRAMUSCULAR | Status: AC
  Administered 2015-03-23: 25 mg via INTRAVENOUS
  Filled 2015-03-23: qty 1

## 2015-03-23 NOTE — ED Provider Notes (Signed)
CSN: [id number]     Arrival date & time 03/23/15  1819 History   First MD Initiated Contact with Patient 03/23/15 1829     Chief Complaint  Patient presents with  . Migraine     (Consider location/radiation/quality/duration/timing/severity/associated sxs/prior Treatment) HPI Comments: Hx of migraines - throbbing and R sided - for 5 days has had throbbing and L sided.  This is made worse with bright lights and assocaited with n/v.  She has been on topamax (starting just prior to headaches starting) and recently started on chantix (has since stopped).  She tried maxalt at home wtihout relief.  She had been seen 2 days ago at ED and had some improvement thoug the pain has returned and been relatively constant since return,.  No head injuries, no numbness / weakness / slurred speech of visual changes.  No f/c.  Long time hx of diagnosed migraines.  She now complains that today she has started to have pain in the L neck and shoulder and arm with movement and palpatoin.  The pt drove herself here without difficulty.  Patient is a 34 y.o. female presenting with migraines. The history is provided by the patient.  Migraine    Past Medical History  Diagnosis Date  . Anxiety   . Migraines   . Fibrocystic disease of both breasts   . Arthritis     both knees  . Depression 12/11/2014  . Body aches 12/11/2014  . Common migraine 12/19/2014  . Fibromyalgia 12/19/2014  . IBS (irritable bowel syndrome)   . Vitamin D deficiency   . Chronic insomnia   . Rebound headache 12/19/2014   Past Surgical History  Procedure Laterality Date  . Cesarean section  [phone]  . Cholecystectomy  2001  . Tubal ligation    . Endometrial ablation     Family History  Problem Relation Age of Onset  . Hypertension Mother   . Other Mother     abnormal cells; had hyst  . Obesity Daughter   . Obesity Son   . Cancer Maternal Grandmother   . Osteoporosis Maternal Grandmother   . Cancer Maternal Grandfather   .  Obesity Daughter   . Diabetes Father   . Hypertension Father   . Alcohol abuse Father    History  Substance Use Topics  . Smoking status: Former Smoker -- 0.00 packs/day for 0 years    Types: Cigarettes  . Smokeless tobacco: Never Used  . Alcohol Use: No   OB History    Gravida Para Term Preterm AB TAB SAB Ectopic Multiple Living   Review of Systems  All other systems reviewed and are negative.     Allergies  Ciprofloxacin  Home Medications   Prior to Admission medications   Medication Sig Start Date End Date Taking? Authorizing Provider  ALPRAZolam Prudy Feeler) 0.5 MG tablet Take 0.25-0.5 mg by mouth daily as needed for anxiety.  11/18/14  Yes Historical Provider, MD  cyanocobalamin (,VITAMIN B-12,) 1000 MCG/ML injection Inject 1,000 mcg into the muscle every 30 (thirty) days.  03/07/15  Yes Historical Provider, MD  gabapentin (NEURONTIN) 600 MG tablet Take 1 tablet (600 mg total) by mouth 3 (three) times daily. 03/06/15  Yes York Spaniel, MD  rizatriptan (MAXALT) 10 MG tablet Take 10 mg by mouth as needed for migraine.  03/07/15  Yes Historical Provider, MD  topiramate (TOPAMAX) 25 MG tablet Take 25 mg by mouth  2 (two) times daily. 03/07/15  Yes Historical Provider, MD  Vitamin D, Ergocalciferol, (DRISDOL) 50000 UNITS CAPS capsule 50,000 Units every 30 (thirty) days.  11/20/14  Yes Historical Provider, MD  metoCLOPramide (REGLAN) 10 MG tablet Take 1 tablet (10 mg total) by mouth every 6 (six) hours. 03/23/15   Eber HongBrian Mohd Clemons, MD  ondansetron (ZOFRAN) 4 MG tablet Take 1 tablet (4 mg total) by mouth every 8 (eight) hours as needed for nausea or vomiting. 03/23/15   Eber HongBrian Chevella Pearce, MD   BP 100/66 mmHg  Pulse 68  Temp(Src) 98 F (36.7 C) (Oral)  Resp 14  Ht 5\' 4"  (1.626 m)  Wt 180 lb (81.647 kg)  BMI 30.88 kg/m2  SpO2 100% Physical Exam  Constitutional: She appears well-developed and well-nourished. No distress.  HENT:  Head: Normocephalic and atraumatic.   Mouth/Throat: Oropharynx is clear and moist. No oropharyngeal exudate.  Eyes: Conjunctivae and EOM are normal. Pupils are equal, round, and reactive to light. Right eye exhibits no discharge. Left eye exhibits no discharge. No scleral icterus.  Neck: Normal range of motion. Neck supple. No JVD present. No thyromegaly present.  Cardiovascular: Normal rate, regular rhythm, normal heart sounds and intact distal pulses.  Exam reveals no gallop and no friction rub.   No murmur heard. Pulmonary/Chest: Effort normal and breath sounds normal. No respiratory distress. She has no wheezes. She has no rales.  Abdominal: Soft. Bowel sounds are normal. She exhibits no distension and no mass. There is no tenderness.  Musculoskeletal: Normal range of motion. She exhibits tenderness ( ttp with palpation of the L neck diffusely, the L shoulder and arm and this is worse wtih palpation.). She exhibits no edema.  Lymphadenopathy:    She has no cervical adenopathy.  Neurological: She is alert. Coordination normal.  Neurologic exam:  Speech clear, pupils equal round reactive to light, extraocular movements intact  Normal peripheral visual fields Cranial nerves III through XII normal including no facial droop Follows commands, moves all extremities x4, normal strength to bilateral upper and lower extremities at all major muscle groups including grip Sensation normal to light touch and pinprick Coordination intact, no limb ataxia, finger-nose-finger normal Rapid alternating movements normal No pronator drift Gait normal   Skin: Skin is warm and dry. No rash noted. No erythema.  Psychiatric: She has a normal mood and affect. Her behavior is normal.  Nursing note and vitals reviewed.   ED Course  Procedures (including critical care time) Labs Review Labs Reviewed - No data to display  Imaging Review Ct Head Wo Contrast  03/21/2015   CLINICAL DATA:  Frontal headache for 5 days.  EXAM: CT HEAD WITHOUT  CONTRAST  TECHNIQUE: Contiguous axial images were obtained from the base of the skull through the vertex without intravenous contrast.  COMPARISON:  MRI 01/08/2015  FINDINGS: No acute intracranial abnormality. Specifically, no hemorrhage, hydrocephalus, mass lesion, acute infarction, or significant intracranial injury. No acute calvarial abnormality. Visualized paranasal sinuses and mastoids clear. Orbital soft tissues unremarkable.  IMPRESSION: Negative.   Electronically Signed   By: Charlett NoseKevin  Dover M.D.   On: 03/21/2015 21:00      MDM   Final diagnoses:  Other migraine without status migrainosus, not intractable    eval for migraines - meds and fluids - no focal neuro, no fever, doubt pathological source.  Possibly due to new meds recently.  Improved significantly after medicatinos as below and fluids.  Amenable to d/c.  Meds given in ED:  Medications  sodium chloride  0.9 % bolus 1,000 mL (1,000 mLs Intravenous New Bag/Given 03/23/15 1859)  ketorolac (TORADOL) 30 MG/ML injection 30 mg (30 mg Intravenous Given 03/23/15 1900)  metoCLOPramide (REGLAN) injection 10 mg (10 mg Intravenous Given 03/23/15 1900)  diphenhydrAMINE (BENADRYL) injection 25 mg (25 mg Intravenous Given 03/23/15 1900)    New Prescriptions   METOCLOPRAMIDE (REGLAN) 10 MG TABLET    Take 1 tablet (10 mg total) by mouth every 6 (six) hours.   ONDANSETRON (ZOFRAN) 4 MG TABLET    Take 1 tablet (4 mg total) by mouth every 8 (eight) hours as needed for nausea or vomiting.      Eber Hong, MD 03/23/15 2021

## 2015-03-23 NOTE — ED Notes (Signed)
Pt reports cont migraine with dizziness. Pt states she has been taking Chantix, doesn't know if the headaches are a side effect.

## 2015-03-23 NOTE — Discharge Instructions (Signed)
Headache:  You are having a headache. No specific cause was found today for your headache. It may have been a migraine or other cause of headache. Stress, anxiety, fatigue, and depression are common triggers for headaches. Your headache today does not appear to be life-threatening or require hospitalization, but often the exact cause of headaches is not determined in the emergency department. Therefore, followup with your doctor is very important to find out what may have caused your headache, and whether or not you need any further diagnostic testing or treatment. Sometimes headaches can appear benign but then more serious symptoms can develop which should prompt an immediate reevaluation by your doctor or the emergency department.  Seek immediate medical attention if:  You develop possible problems with medications prescribed. The medications don't resolve your headache, if it recurs, or if you have multiple episodes of vomiting or can't take fluids by mouth You have a change from the usual headache. If you developed a sudden severe headache or confusion, become poorly responsive or faint, developed a fever above 100.4 or problems breathing, have a change in speech, vision, swallowing or understanding, or developed new weakness, numbness, tingling, incoordination or have a seizure.  If you don't have a family doctor to follow up with, see the follow up list below - call this morning for a follow-up appointment in the next 1-2 days.  RESOURCE GUIDE  Dental Problems  Patients with Medicaid: [facility]                     [facility] [address]                                           [address] Phone:  [phone]                                                  Phone:  [phone]  If unable to pay or uninsured, contact:  [facility] or [facility] to become qualified for the adult dental clinic.  Chronic Pain Problems Contact [facility]  [phone] Patients need to be referred by their primary care doctor.  Insufficient Money for Medicine Contact [employer]:  call "211" or [facility] 271-5999.  No Primary Care Doctor Call [facility]  832-8000 Other agencies that provide inexpensive medical care    [facility]  [phone]     Internal Medicine  [phone]    [facility]  [phone]    Women's Clinic  [phone]    [facility]  [phone]    [facility]  [phone]  Psychological Services [facility]  [phone] [facility]  [phone] [facility]   800 853-5163 (emergency services [phone])  Substance Abuse Resources [facility]  336-882-2125 [facility] 336-784-9470 [facility] 336-285-9073 [facility] 336-845-3988 Residential & Outpatient Substance Abuse Program  800-659-3381  Abuse/Neglect [place] Child Abuse Hotline (336) 641-3795 [place] Child Abuse Hotline 800-378-5315 (After Hours)  Emergency Shelter [facility] (336) 271-5985  [facility] (336) 275-9566 [facility] (704) 372-4663  MRSA Hotline #:     832-7006    [place] Resources  [facility] [place]     [employer]                          [facility] [address] [place]                       [address]      [address]                                                  [place]                            [place] Phone:  [phone]                                   Phone:  [phone]                 Phone:  [phone]  [facility] Phone:  [phone]  [place] Child Abuse Hotline (336) 342-1394 (336) 342-3537 (After Hours)   

## 2015-03-25 ENCOUNTER — Emergency Department (HOSPITAL_COMMUNITY): Payer: BLUE CROSS/BLUE SHIELD

## 2015-03-25 ENCOUNTER — Encounter (HOSPITAL_COMMUNITY): Payer: Self-pay | Admitting: Cardiology

## 2015-03-25 ENCOUNTER — Emergency Department (HOSPITAL_COMMUNITY)
Admission: EM | Admit: 2015-03-25 | Discharge: 2015-03-25 | Disposition: A | Discharge status: Home / Self Care | Payer: BLUE CROSS/BLUE SHIELD | Attending: Emergency Medicine | Admitting: Emergency Medicine

## 2015-03-25 DIAGNOSIS — M6281 Muscle weakness (generalized): Secondary | ICD-10-CM | POA: Insufficient documentation

## 2015-03-25 DIAGNOSIS — M797 Fibromyalgia: Secondary | ICD-10-CM | POA: Insufficient documentation

## 2015-03-25 DIAGNOSIS — R200 Anesthesia of skin: Secondary | ICD-10-CM | POA: Insufficient documentation

## 2015-03-25 DIAGNOSIS — Z87891 Personal history of nicotine dependence: Secondary | ICD-10-CM | POA: Insufficient documentation

## 2015-03-25 DIAGNOSIS — R51 Headache: Secondary | ICD-10-CM | POA: Diagnosis present

## 2015-03-25 DIAGNOSIS — Z8719 Personal history of other diseases of the digestive system: Secondary | ICD-10-CM | POA: Insufficient documentation

## 2015-03-25 DIAGNOSIS — Z79899 Other long term (current) drug therapy: Secondary | ICD-10-CM | POA: Insufficient documentation

## 2015-03-25 DIAGNOSIS — Z3202 Encounter for pregnancy test, result negative: Secondary | ICD-10-CM | POA: Insufficient documentation

## 2015-03-25 DIAGNOSIS — Z8742 Personal history of other diseases of the female genital tract: Secondary | ICD-10-CM | POA: Diagnosis not present

## 2015-03-25 DIAGNOSIS — G40909 Epilepsy, unspecified, not intractable, without status epilepticus: Secondary | ICD-10-CM | POA: Insufficient documentation

## 2015-03-25 DIAGNOSIS — F329 Major depressive disorder, single episode, unspecified: Secondary | ICD-10-CM | POA: Diagnosis not present

## 2015-03-25 DIAGNOSIS — G4700 Insomnia, unspecified: Secondary | ICD-10-CM | POA: Insufficient documentation

## 2015-03-25 DIAGNOSIS — F419 Anxiety disorder, unspecified: Secondary | ICD-10-CM | POA: Insufficient documentation

## 2015-03-25 DIAGNOSIS — G43919 Migraine, unspecified, intractable, without status migrainosus: Secondary | ICD-10-CM

## 2015-03-25 DIAGNOSIS — G43019 Migraine without aura, intractable, without status migrainosus: Secondary | ICD-10-CM | POA: Insufficient documentation

## 2015-03-25 DIAGNOSIS — E559 Vitamin D deficiency, unspecified: Secondary | ICD-10-CM | POA: Insufficient documentation

## 2015-03-25 LAB — POC URINE PREG, ED: Preg Test, Ur: NEGATIVE

## 2015-03-25 MED ORDER — MAGNESIUM SULFATE 2 GM/50ML IV SOLN
2.0000 g | Freq: Once | INTRAVENOUS | Status: AC
  Administered 2015-03-25: 2 g via INTRAVENOUS
  Filled 2015-03-25: qty 50

## 2015-03-25 MED ORDER — DEXAMETHASONE SODIUM PHOSPHATE 10 MG/ML IJ SOLN
10.0000 mg | Freq: Once | INTRAMUSCULAR | Status: AC
  Administered 2015-03-25: 10 mg via INTRAVENOUS
  Filled 2015-03-25: qty 1

## 2015-03-25 MED ORDER — SODIUM CHLORIDE 0.9 % IV BOLUS (SEPSIS)
1000.0000 mL | Freq: Once | INTRAVENOUS | Status: AC
  Administered 2015-03-25: 1000 mL via INTRAVENOUS

## 2015-03-25 MED ORDER — PROCHLORPERAZINE EDISYLATE 5 MG/ML IJ SOLN
10.0000 mg | Freq: Once | INTRAMUSCULAR | Status: AC
  Administered 2015-03-25: 10 mg via INTRAVENOUS
  Filled 2015-03-25: qty 2

## 2015-03-25 MED ORDER — KETOROLAC TROMETHAMINE 30 MG/ML IJ SOLN
30.0000 mg | Freq: Once | INTRAMUSCULAR | Status: AC
  Administered 2015-03-25: 30 mg via INTRAVENOUS
  Filled 2015-03-25: qty 1

## 2015-03-25 NOTE — ED Notes (Signed)
Patient reports L sided HA that started after a seizure (she reports related to Chantix).  History of migraines, but they are normally on R side.  She also reports L sided weakness, numbness and tingling.  Grip strength LUE 3/5, bicep flexion 4/5; LLE plantar/dorsiflexion 3/5.  No extremity drift noted. 5/5 resistance against force of upper and lower L extremity. No facial droop. No speech slurring.

## 2015-03-25 NOTE — ED Notes (Addendum)
States she has had a headache since last Tuesday.  Seen here with same times 2 since.  C/o all over left side pain that is new.

## 2015-03-25 NOTE — ED Notes (Signed)
Patient with continued HA complaints at this time. Respirations even and unlabored. Skin warm/dry. Discharge instructions reviewed with patient at this time. Patient given opportunity to voice concerns/ask questions. IV removed per policy and band-aid applied to site. Patient discharged at this time and left Emergency Department with steady gait.

## 2015-03-25 NOTE — ED Notes (Signed)
Attempt x 1 for IV w/out success.  Patient is difficult stick, unable to see anything to access. Dr. Jodi MourningZavitz notified and will attempt w/US

## 2015-03-25 NOTE — Discharge Instructions (Signed)
If you were given medicines take as directed.  If you are on coumadin or contraceptives realize their levels and effectiveness is altered by many different medicines.  If you have any reaction (rash, tongues swelling, other) to the medicines stop taking and see a physician.    If your blood pressure was elevated in the ER make sure you follow up for management with a primary doctor or return for chest pain, shortness of breath or stroke symptoms.  Please follow up as directed and return to the ER or see a physician for new or worsening symptoms.  Thank you. Filed Vitals:   03/25/15 1021  BP: 108/75  Pulse: 64  Temp: 98 F (36.7 C)  TempSrc: Oral  Resp: 16  Height: 5\' 4"  (1.626 m)  Weight: 180 lb (81.647 kg)  SpO2: 100%   You are having a headache. No specific cause was found today for your headache. It may have been a migraine or other cause of headache. Stress, anxiety, fatigue, and depression are common triggers for headaches. Your headache today does not appear to be life-threatening or require hospitalization, but often the exact cause of headaches is not determined in the emergency department. Therefore, follow-up with your doctor is very important to find out what may have caused your headache, and whether or not you need any further diagnostic testing or treatment. Sometimes headaches can appear benign (not harmful), but then more serious symptoms can develop which should prompt an immediate re-evaluation by your doctor or the emergency department.  SEEK MEDICAL ATTENTION IF:  You develop possible problems with medications prescribed.  The medications don't resolve your headache, if it recurs , or if you have multiple episodes of vomiting or can't take fluids. You have a change from the usual headache.  RETURN IMMEDIATELY IF you develop a sudden, severe headache or confusion, become poorly responsive or faint, develop a fever above 100.4F or problem breathing, have a change in speech,  vision, swallowing, or understanding, or develop new weakness, numbness, tingling, incoordination, or have a seizure.

## 2015-03-25 NOTE — ED Provider Notes (Signed)
CSN: [id number]     Arrival date & time 03/25/15  1008 History  This chart was scribed for Blane OharaJoshua Kurt Azimi, MD by Leona CarryG. Clay Sherrill, ED Scribe. The patient was seen in [facility]. The patient's care was started at 12:19 PM.      Chief Complaint  Patient presents with  . Headache   Patient is a 34 y.o. female presenting with headaches. The history is provided by the patient. No language interpreter was used.  Headache Associated symptoms: dizziness, numbness and weakness   Associated symptoms: no abdominal pain, no back pain, no congestion, no fever, no neck pain, no neck stiffness and no vomiting    HPI Comments: Oneita HurtCasey J Duda is a 34 y.o. female with a history of migraines who presents to the Emergency Department complaining of a left-sided HA that began 7 days ago after a seizure. She has been seen in the ED twice in the past week for the headache. She reports that the headache is becoming progressively worse and that she has never experienced a migraine like this before. Patient recently began taking Chantix and believes her seizure may be related to this. She does not have a history of seizures.  Patient also complains generalized weakness to the left side of her body. She states that the left side of her body feels subjectively different than the right side of her body. Patient also complains of dizziness.            Past Medical History  Diagnosis Date  . Anxiety   . Migraines   . Fibrocystic disease of both breasts   . Arthritis     both knees  . Depression 12/11/2014  . Body aches 12/11/2014  . Common migraine 12/19/2014  . Fibromyalgia 12/19/2014  . IBS (irritable bowel syndrome)   . Vitamin D deficiency   . Chronic insomnia   . Rebound headache 12/19/2014   Past Surgical History  Procedure Laterality Date  . Cesarean section  [phone],2000,2004  . Cholecystectomy  2001  . Tubal ligation    . Endometrial ablation     Family History  Problem Relation Age of Onset  .  Hypertension Mother   . Other Mother     abnormal cells; had hyst  . Obesity Daughter   . Obesity Son   . Cancer Maternal Grandmother   . Osteoporosis Maternal Grandmother   . Cancer Maternal Grandfather   . Obesity Daughter   . Diabetes Father   . Hypertension Father   . Alcohol abuse Father    History  Substance Use Topics  . Smoking status: Former Smoker -- 0.00 packs/day for 0 years    Types: Cigarettes  . Smokeless tobacco: Never Used  . Alcohol Use: No   OB History    Gravida Para Term Preterm AB TAB SAB Ectopic Multiple Living   3 3        3      Review of Systems  Constitutional: Negative for fever and chills.  HENT: Negative for congestion.   Eyes: Negative for visual disturbance.  Respiratory: Negative for shortness of breath.   Cardiovascular: Negative for chest pain.  Gastrointestinal: Negative for vomiting and abdominal pain.  Genitourinary: Negative for dysuria and flank pain.  Musculoskeletal: Negative for back pain, neck pain and neck stiffness.  Skin: Negative for rash.  Neurological: Positive for dizziness, weakness, numbness and headaches. Negative for light-headedness.      Allergies  Ciprofloxacin  Home Medications   Prior to Admission medications  Medication Sig Start Date End Date Taking? Authorizing Provider  ALPRAZolam Prudy Feeler) 0.5 MG tablet Take 0.25-0.5 mg by mouth daily as needed for anxiety.  11/18/14  Yes Historical Provider, MD  cyanocobalamin (,VITAMIN B-12,) 1000 MCG/ML injection Inject 1,000 mcg into the muscle every 30 (thirty) days.  03/07/15  Yes Historical Provider, MD  gabapentin (NEURONTIN) 600 MG tablet Take 1 tablet (600 mg total) by mouth 3 (three) times daily. 03/06/15  Yes York Spaniel, MD  rizatriptan (MAXALT) 10 MG tablet Take 10 mg by mouth as needed for migraine.  03/07/15  Yes Historical Provider, MD  topiramate (TOPAMAX) 25 MG tablet Take 25 mg by mouth 2 (two) times daily. 03/07/15  Yes Historical Provider, MD  Vitamin  D, Ergocalciferol, (DRISDOL) 50000 UNITS CAPS capsule 50,000 Units every 30 (thirty) days.  11/20/14  Yes Historical Provider, MD  metoCLOPramide (REGLAN) 10 MG tablet Take 1 tablet (10 mg total) by mouth every 6 (six) hours. Patient not taking: Reported on 03/25/2015 03/23/15   Eber Hong, MD  ondansetron (ZOFRAN) 4 MG tablet Take 1 tablet (4 mg total) by mouth every 8 (eight) hours as needed for nausea or vomiting. Patient not taking: Reported on 03/25/2015 03/23/15   Eber Hong, MD   Triage Vitals: BP 108/75 mmHg  Pulse 64  Temp(Src) 98 F (36.7 C) (Oral)  Resp 16  Ht  (1.626 m)  Wt 180 lb (81.647 kg)  BMI 30.88 kg/m2  SpO2 100% Physical Exam  Constitutional: She is oriented to person, place, and time. She appears well-developed and well-nourished. No distress.  HENT:  Head: Normocephalic and atraumatic.  Eyes: Conjunctivae and EOM are normal. Pupils are equal, round, and reactive to light.  Neck: Neck supple. No tracheal deviation present.  Cardiovascular: Normal rate.   Pulmonary/Chest: Effort normal. No respiratory distress.  Musculoskeletal: Normal range of motion.  Neurological: She is alert and oriented to person, place, and time.  No facial droop. Subjectively feels different on the left side of the face than the ride side. Sensation intact to arms. Equal strength bilaterally. No arm drift. 5+ strength in UE and LE with f/e at major joints. Sensation to palpation intact in UE and LE. CNs 2-12 grossly intact.  EOMFI.  PERRL.   Finger nose and coordination intact bilateral.   Visual fields intact to finger testing.   Skin: Skin is warm and dry.  Nursing note and vitals reviewed.   ED Course  Procedures (including critical care time) Emergency Ultrasound Study:   Angiocath insertion Performed by: Enid Skeens  Consent: Verbal consent obtained. Risks and benefits: risks, benefits and alternatives were discussed Immediately prior to procedure the correct  patient, procedure, equipment, support staff and site/side marked as needed.  Indication: difficult IV access Preparation: Patient was prepped and draped in the usual sterile fashion. Vein Location: 20 g l vein was visualized during assessment for potential access sites and was found to be patent/ easily compressed with linear ultrasound.  The needle was visualized with real-time ultrasound and guided into the vein. Gauge: 20  Image saved and stored.  Normal blood return.  Patient tolerance: Patient tolerated the procedure well with no immediate complications.     DIAGNOSTIC STUDIES: Oxygen Saturation is 100% on room air, normal by my interpretation.    COORDINATION OF CARE:    Labs Review Labs Reviewed  POC URINE PREG, ED    Imaging Review Mr Brain Wo Contrast  03/25/2015   CLINICAL DATA:  Left-sided numbness. History  of seizure. Left-sided headache.  EXAM: MRI HEAD WITHOUT CONTRAST  TECHNIQUE: Multiplanar, multiecho pulse sequences of the brain and surrounding structures were obtained without intravenous contrast.  COMPARISON:  CT head 03/21/2015, MRI head 01/08/2015  FINDINGS: Ventricle size is normal.  Cerebral volume is normal  Pituitary normal in size. Cervical medullary junction normal. Negative for Chiari malformation. Paranasal sinuses clear  Negative for acute or chronic ischemia  Negative for demyelinating disease. Cerebral white matter is normal.  Negative for hemorrhage or mass lesion  Medial temporal lobe is normal in signal and volume bilaterally.  MRV HEAD WITHOUT CONTRAST  Superior sagittal sinus widely patent. Transverse and sigmoid sinus widely patent bilaterally. Straight sinus patent.  IMPRESSION: Normal MRI head without contrast  Normal MRV head   Electronically Signed   By: Marlan Palau M.D.   On: 03/25/2015 13:39   Mr [place]  03/25/2015   CLINICAL DATA:  Left-sided numbness. History of seizure. Left-sided headache.  EXAM: MRI HEAD WITHOUT CONTRAST   TECHNIQUE: Multiplanar, multiecho pulse sequences of the brain and surrounding structures were obtained without intravenous contrast.  COMPARISON:  CT head 03/21/2015, MRI head 01/08/2015  FINDINGS: Ventricle size is normal.  Cerebral volume is normal  Pituitary normal in size. Cervical medullary junction normal. Negative for Chiari malformation. Paranasal sinuses clear  Negative for acute or chronic ischemia  Negative for demyelinating disease. Cerebral white matter is normal.  Negative for hemorrhage or mass lesion  Medial temporal lobe is normal in signal and volume bilaterally.  MRV HEAD WITHOUT CONTRAST  Superior sagittal sinus widely patent. Transverse and sigmoid sinus widely patent bilaterally. Straight sinus patent.  IMPRESSION: Normal MRI head without contrast  Normal MRV head   Electronically Signed   By: Marlan Palau M.D.   On: 03/25/2015 13:39     EKG Interpretation None      MDM   Final diagnoses:  Left sided numbness  Intractable migraine, unspecified migraine type   I personally performed the services described in this documentation, which was scribed in my presence. The recorded information has been reviewed and is accurate. \ Patient presents with worsening migraine symptoms, gradual onset headache however in different location than normal patient has left-sided symptoms. No focal neuro deficits on exam subjectively decreased sensation on the left side. Plan for migraine treatment. With 3 visits in the past week MRI and MRV ordered to look for other causes aside from migraine of her worsening persistent migraine headache.  MRI results reviewed no acute findings. Difficult IV IV ultrasound guided. Migraine medicines given. Results and differential diagnosis were discussed with the patient/parent/guardian. Close follow up outpatient was discussed, comfortable with the plan.   Medications  ketorolac (TORADOL) 30 MG/ML injection 30 mg (30 mg Intravenous Given 03/25/15 1510)   prochlorperazine (COMPAZINE) injection 10 mg (10 mg Intravenous Given 03/25/15 1511)  magnesium sulfate IVPB 2 g 50 mL (2 g Intravenous New Bag/Given 03/25/15 1511)  sodium chloride 0.9 % bolus 1,000 mL (1,000 mLs Intravenous New Bag/Given 03/25/15 1510)  dexamethasone (DECADRON) injection 10 mg (10 mg Intravenous Given 03/25/15 1511)    Filed Vitals:   03/25/15 1021  BP: 108/75  Pulse: 64  Temp: 98 F (36.7 C)  TempSrc: Oral  Resp: 16  Height:  (1.626 m)  Weight: 180 lb (81.647 kg)  SpO2: 100%    Final diagnoses:  Left sided numbness  Intractable migraine, unspecified migraine type       Blane Ohara, MD 03/25/15 1615

## 2015-03-25 NOTE — ED Provider Notes (Signed)
MR imaging negative Pt can ambulate No facial droop No arm drift She has neuro and PCP managing her condition She would like to go home   Zadie Rhineonald Latreshia Beauchaine, MD 03/25/15 1631

## 2015-03-25 NOTE — ED Notes (Signed)
Iv attempt x i in right wrist unsuccessful. Patient states she wants a med to get rid of her headache, states she only got temporary relief from toradol the other day.

## 2015-03-27 ENCOUNTER — Telehealth: Payer: Self-pay | Admitting: Adult Health

## 2015-03-27 NOTE — Telephone Encounter (Signed)
Left message x 1. JSY 

## 2015-03-28 ENCOUNTER — Encounter (HOSPITAL_COMMUNITY): Payer: Self-pay | Admitting: *Deleted

## 2015-03-28 ENCOUNTER — Emergency Department (HOSPITAL_COMMUNITY)
Admission: EM | Admit: 2015-03-28 | Discharge: 2015-03-28 | Disposition: A | Discharge status: Home / Self Care | Payer: BLUE CROSS/BLUE SHIELD | Attending: Emergency Medicine | Admitting: Emergency Medicine

## 2015-03-28 DIAGNOSIS — Z87891 Personal history of nicotine dependence: Secondary | ICD-10-CM | POA: Insufficient documentation

## 2015-03-28 DIAGNOSIS — F419 Anxiety disorder, unspecified: Secondary | ICD-10-CM | POA: Diagnosis not present

## 2015-03-28 DIAGNOSIS — Z8739 Personal history of other diseases of the musculoskeletal system and connective tissue: Secondary | ICD-10-CM | POA: Insufficient documentation

## 2015-03-28 DIAGNOSIS — G43009 Migraine without aura, not intractable, without status migrainosus: Secondary | ICD-10-CM

## 2015-03-28 DIAGNOSIS — Z87448 Personal history of other diseases of urinary system: Secondary | ICD-10-CM | POA: Insufficient documentation

## 2015-03-28 DIAGNOSIS — F329 Major depressive disorder, single episode, unspecified: Secondary | ICD-10-CM | POA: Insufficient documentation

## 2015-03-28 DIAGNOSIS — G43909 Migraine, unspecified, not intractable, without status migrainosus: Secondary | ICD-10-CM | POA: Insufficient documentation

## 2015-03-28 DIAGNOSIS — R109 Unspecified abdominal pain: Secondary | ICD-10-CM | POA: Insufficient documentation

## 2015-03-28 DIAGNOSIS — R42 Dizziness and giddiness: Secondary | ICD-10-CM | POA: Diagnosis present

## 2015-03-28 LAB — COMPREHENSIVE METABOLIC PANEL
ALT: 23 U/L (ref 14–54)
ANION GAP: 9 (ref 5–15)
AST: 18 U/L (ref 15–41)
Albumin: 3.4 g/dL — ABNORMAL LOW (ref 3.5–5.0)
Alkaline Phosphatase: 55 U/L (ref 38–126)
BUN: 13 mg/dL (ref 6–20)
CALCIUM: 8.5 mg/dL — AB (ref 8.9–10.3)
CO2: 24 mmol/L (ref 22–32)
CREATININE: 0.97 mg/dL (ref 0.44–1.00)
Chloride: 104 mmol/L (ref 101–111)
GFR calc Af Amer: >60 mL/min (ref >60)
Glucose, Bld: 80 mg/dL (ref 65–99)
Potassium: 3.8 mmol/L (ref 3.5–5.1)
Sodium: 137 mmol/L (ref 135–145)
Total Bilirubin: 0.2 mg/dL — ABNORMAL LOW (ref 0.3–1.2)
Total Protein: 5.9 g/dL — ABNORMAL LOW (ref 6.5–8.1)

## 2015-03-28 LAB — URINALYSIS, ROUTINE W REFLEX MICROSCOPIC
Bilirubin Urine: NEGATIVE
Glucose, UA: NEGATIVE mg/dL
Hgb urine dipstick: NEGATIVE
KETONES UR: NEGATIVE mg/dL
Nitrite: NEGATIVE
PH: 6 (ref 5.0–8.0)
Protein, ur: NEGATIVE mg/dL
Specific Gravity, Urine: 1.017 (ref 1.005–1.030)
Urobilinogen, UA: 1 mg/dL (ref 0.0–1.0)

## 2015-03-28 LAB — CBC
HCT: 39.2 % (ref 36.0–46.0)
Hemoglobin: 12.7 g/dL (ref 12.0–15.0)
MCH: 30.7 pg (ref 26.0–34.0)
MCHC: 32.4 g/dL (ref 30.0–36.0)
MCV: 94.7 fL (ref 78.0–100.0)
Platelets: 200 10*3/uL (ref 150–400)
RBC: 4.14 MIL/uL (ref 3.87–5.11)
RDW: 13.1 % (ref 11.5–15.5)
WBC: 9.5 10*3/uL (ref 4.0–10.5)

## 2015-03-28 LAB — URINE MICROSCOPIC-ADD ON

## 2015-03-28 LAB — I-STAT BETA HCG BLOOD, ED (MC, WL, AP ONLY)

## 2015-03-28 MED ORDER — METOCLOPRAMIDE HCL 5 MG/ML IJ SOLN
10.0000 mg | Freq: Once | INTRAMUSCULAR | Status: AC
  Administered 2015-03-28: 10 mg via INTRAVENOUS
  Filled 2015-03-28: qty 2

## 2015-03-28 MED ORDER — KETOROLAC TROMETHAMINE 30 MG/ML IJ SOLN
30.0000 mg | Freq: Once | INTRAMUSCULAR | Status: AC
  Administered 2015-03-28: 30 mg via INTRAVENOUS
  Filled 2015-03-28: qty 1

## 2015-03-28 NOTE — ED Notes (Signed)
Pt states that she had a seizure last Wednesday. Pt states that she has had confusion since then and "lost about 5 days" pt has had difficulty urinating and states that she has had a change in the way things taste. Pt states that she has generalized weakness and pain. No neuro deficits in triage.

## 2015-03-28 NOTE — Discharge Instructions (Signed)

## 2015-03-28 NOTE — ED Notes (Signed)
NAD at this time. Pt is stable and leaving with family.   

## 2015-03-28 NOTE — Telephone Encounter (Signed)
Left message x 2. JSY 

## 2015-03-28 NOTE — ED Notes (Signed)
Pt still refusing to be on monitor and demanding her IV out, pt states i want this out and i'm ready to go.

## 2015-03-28 NOTE — ED Provider Notes (Signed)
CSN: [id number]     Arrival date & time 03/28/15  1158 History   First MD Initiated Contact with Patient 03/28/15 1556     Chief Complaint  Patient presents with  . Urinary Retention  . Dizziness     (Consider location/radiation/quality/duration/timing/severity/associated sxs/prior Treatment) HPI Mary Parker is a 34 y.o female who presents with urinary retention since yesterday. He states that she also has a headache. She states that while at work today her coworkers noticed that she appeared swollen over her entire body. She says that she tried to empty her bladder while at work and could not urinate.  She states that she thought back and had not urinated since yesterday afternoon.  She states that she drank half a gallon of tea yesterday.  She saw her pcp on 5/24 and referred her to neurology and she has an appointment on Tuesday.  She was seen in the ED on 5/20, 5/22, and 5/24 for migraines.She denies any fever, chills, chest pain, nausea, vomiting, diarrhea, constipation, hematuria, dysuria, or urinary frequency.   Past Medical History  Diagnosis Date  . Anxiety   . Migraines   . Fibrocystic disease of both breasts   . Arthritis     both knees  . Depression 12/11/2014  . Body aches 12/11/2014  . Common migraine 12/19/2014  . Fibromyalgia 12/19/2014  . IBS (irritable bowel syndrome)   . Vitamin D deficiency   . Chronic insomnia   . Rebound headache 12/19/2014   Past Surgical History  Procedure Laterality Date  . Cesarean section  [phone]  . Cholecystectomy  2001  . Tubal ligation    . Endometrial ablation     Family History  Problem Relation Age of Onset  . Hypertension Mother   . Other Mother     abnormal cells; had hyst  . Obesity Daughter   . Obesity Son   . Cancer Maternal Grandmother   . Osteoporosis Maternal Grandmother   . Cancer Maternal Grandfather   . Obesity Daughter   . Diabetes Father   . Hypertension Father   . Alcohol abuse Father    History   Substance Use Topics  . Smoking status: Former Smoker -- 0.00 packs/day for 0 years    Types: Cigarettes  . Smokeless tobacco: Never Used  . Alcohol Use: No   OB History    Gravida Para Term Preterm AB TAB SAB Ectopic Multiple Living   Review of Systems  Constitutional: Negative for fever and chills.  Respiratory: Negative for shortness of breath.   Cardiovascular: Negative for chest pain.  Gastrointestinal: Positive for nausea, vomiting and abdominal pain.  Genitourinary: Positive for difficulty urinating. Negative for frequency and hematuria.  Neurological: Positive for headaches. Negative for weakness and numbness.  All other systems reviewed and are negative.     Allergies  Ciprofloxacin  Home Medications   Prior to Admission medications   Medication Sig Start Date End Date Taking? Authorizing Provider  ALPRAZolam Prudy Feeler) 0.5 MG tablet Take 0.25-0.5 mg by mouth daily as needed for anxiety.  11/18/14  Yes Historical Provider, MD  cyanocobalamin (,VITAMIN B-12,) 1000 MCG/ML injection Inject 1,000 mcg into the muscle every 30 (thirty) days. Las dose was on 03-17-15 03/07/15  Yes Historical Provider, MD  gabapentin (NEURONTIN) 600 MG tablet Take 1 tablet (600 mg total) by mouth 3 (three) times daily. 03/06/15  Yes York Spaniel, MD  topiramate (TOPAMAX) 25  MG tablet Take 25 mg by mouth 2 (two) times daily. 03/07/15  Yes Historical Provider, MD  Vitamin D, Ergocalciferol, (DRISDOL) 50000 UNITS CAPS capsule Inject 50,000 Units as directed every 30 (thirty) days. Last dose was on 03-17-15 11/20/14  Yes Historical Provider, MD  metoCLOPramide (REGLAN) 10 MG tablet Take 1 tablet (10 mg total) by mouth every 6 (six) hours. Patient not taking: Reported on 03/25/2015 03/23/15   Eber HongBrian Miller, MD  ondansetron (ZOFRAN) 4 MG tablet Take 1 tablet (4 mg total) by mouth every 8 (eight) hours as needed for nausea or vomiting. Patient not taking: Reported on 03/25/2015 03/23/15   Eber HongBrian  Miller, MD   BP 108/57 mmHg  Pulse 64  Temp(Src) 97.9 F (36.6 C) (Oral)  Resp 20  SpO2 100% Physical Exam  Constitutional: She is oriented to person, place, and time. She appears well-developed and well-nourished.  HENT:  Head: Normocephalic and atraumatic.  Eyes: Conjunctivae are normal.  Neck: Normal range of motion. Neck supple.  Cardiovascular: Normal rate, regular rhythm and normal heart sounds.   Pulmonary/Chest: Effort normal and breath sounds normal.  Abdominal: Soft. She exhibits no distension and no mass. There is generalized tenderness. There is no guarding, no CVA tenderness, no tenderness at McBurney's point and negative Murphy's sign.  Obese abdomen.  Musculoskeletal: Normal range of motion.  Neurological: She is alert and oriented to person, place, and time. She has normal strength. No sensory deficit. GCS eye subscore is 4. GCS verbal subscore is 5. GCS motor subscore is 6.  Skin: Skin is warm and dry.  Nursing note and vitals reviewed.   ED Course  Procedures (including critical care time) Labs Review Labs Reviewed  URINALYSIS, ROUTINE W REFLEX MICROSCOPIC (NOT AT [facility]) - Abnormal; Notable for the following:    APPearance CLOUDY (*)    Leukocytes, UA SMALL (*)    All other components within normal limits  COMPREHENSIVE METABOLIC PANEL - Abnormal; Notable for the following:    Calcium 8.5 (*)    Total Protein 5.9 (*)    Albumin 3.4 (*)    Total Bilirubin 0.2 (*)    All other components within normal limits  URINE MICROSCOPIC-ADD ON - Abnormal; Notable for the following:    Squamous Epithelial / LPF MANY (*)    Bacteria, UA MANY (*)    All other components within normal limits  URINE CULTURE  CBC  I-STAT BETA HCG BLOOD, ED ([facility], [facility], [facility] ONLY)    Imaging Review No results found.   EKG interpretation 03/28/15 12:05 Vent. rate 72 BPM PR interval 160 ms QRS duration 92 ms QT/QTc 416/455 ms P-R-T axes 34 51 30 Normal Sinus rhythm I interpreted this  EKG.  Hebert SohoHanna Patel PA-C. MDM   Final diagnoses:  Nonintractable migraine, unspecified migraine type  Patient presents for urinary retention since yesterday and chronic headaches. He should has been seen 3 times in the ED in the past week for headaches and once by her primary care provider. She states she was referred to neurology and has an appointment next Tuesday. She had an MRI and a CT brain on May 24 which were both negative. Medications  ketorolac (TORADOL) 30 MG/ML injection 30 mg (30 mg Intravenous Given 03/28/15 1735)  metoCLOPramide (REGLAN) injection 10 mg (10 mg Intravenous Given 03/28/15 1736)   Her vital signs are normal. EKG is normal. Her labs are unremarkable. Bladder scanner showed 286 mL. Patient was able to give a urine sample in the ED without difficulty.  She does not have a UTI and she is not pregnant.  She states her headache has resolved now and she would like to go. She refused KUB for abdominal pain. She states that she needs to smoke a cigarette.    Catha Gosselin, PA-C 03/29/15 0020  Geoffery Lyons, MD 03/29/15 [phone]

## 2015-03-28 NOTE — ED Notes (Signed)
Pt being very argumentative and rude, pt refusing to be placed back on monitor pt states the beeping hurts her eyes.

## 2015-03-28 NOTE — ED Notes (Signed)
Pt bladder scanned, 236 ml, , reported. Pt ambulated to the bathroom attempting to give urine sample.

## 2015-03-30 LAB — URINE CULTURE: Colony Count: 30000

## 2015-04-01 ENCOUNTER — Encounter: Payer: Self-pay | Admitting: Neurology

## 2015-04-01 ENCOUNTER — Ambulatory Visit (INDEPENDENT_AMBULATORY_CARE_PROVIDER_SITE_OTHER): Payer: BLUE CROSS/BLUE SHIELD | Admitting: Neurology

## 2015-04-01 VITALS — BP 116/82 | HR 83 | Ht 63.0 in | Wt 191.6 lb

## 2015-04-01 DIAGNOSIS — M797 Fibromyalgia: Secondary | ICD-10-CM

## 2015-04-01 DIAGNOSIS — R404 Transient alteration of awareness: Secondary | ICD-10-CM | POA: Diagnosis not present

## 2015-04-01 DIAGNOSIS — G43019 Migraine without aura, intractable, without status migrainosus: Secondary | ICD-10-CM | POA: Diagnosis not present

## 2015-04-01 MED ORDER — TIZANIDINE HCL 2 MG PO TABS: ORAL_TABLET | ORAL | Status: DC

## 2015-04-01 NOTE — Progress Notes (Signed)
Reason for visit: Migraine headache  Referring physician:   VIKTORIYA Parker is a 34 y.o. female  History of present illness:  [name] is a 34 year old right-handed white female with a history of intractable migraine headache. The patient has significant underlying depression and anxiety issues as well that have worsened recently. She has been seen multiple occasions in the emergency room over the last several weeks for increased headache. The patient has undergone MRI of the brain and MRV of the head that were unremarkable. The patient reports that her usual migraines are right-sided, but the current headaches are usually in the back of the head, associated with some neck stiffness and neck discomfort and shoulder pain. The patient likely has fibromyalgia symptoms as well. She was placed on several medications recently including nortriptyline. She could not tolerate this medication secondary to gastroesophageal reflux disease. She was given a trial on gabapentin, but this has not really helped her headaches. She was placed on Topamax, but the patient could not tolerate this secondary to cognitive clouding, taste alteration. She was on Chantix to help her stop smoking, and she was taken off of Zoloft. This resulted in a trembling episode that occurred on 03/19/2015. The patient does not have any recollection of events for 5 days after this event. She did not bite her tongue or lose control the bowels or the bladder. She has reported some urinary retention issues. She is having increased depression, crying spells, feeling trembling inside. She is having episodes of feeling hot with near syncope. She reports tinnitus in the left ear. The headaches are present all day long currently, and may be associated some nausea. With her true migraines which are occurring once a week, she will get photophobia and phonophobia, nausea and vomiting. She returns to this office for an evaluation following referral  from [facility] emergency room.  Past Medical History  Diagnosis Date  . Anxiety   . Migraines   . Fibrocystic disease of both breasts   . Arthritis     both knees  . Depression 12/11/2014  . Body aches 12/11/2014  . Common migraine 12/19/2014  . Fibromyalgia 12/19/2014  . IBS (irritable bowel syndrome)   . Vitamin D deficiency   . Chronic insomnia   . Rebound headache 12/19/2014  . Seizures     Past Surgical History  Procedure Laterality Date  . Cesarean section  [id number]  . Cholecystectomy  2001  . Tubal ligation    . Endometrial ablation      Family History  Problem Relation Age of Onset  . Hypertension Mother   . Other Mother     abnormal cells; had hyst  . Obesity Daughter   . Obesity Son   . Cancer Maternal Grandmother   . Osteoporosis Maternal Grandmother   . Cancer Maternal Grandfather   . Obesity Daughter   . Diabetes Father   . Hypertension Father   . Alcohol abuse Father     Social history:  reports that she has been smoking Cigarettes.  She has been smoking about 1.00 pack per day for the past 0 years. She has never used smokeless tobacco. She reports that she does not drink alcohol or use illicit drugs.  Medications:  Prior to Admission medications   Medication Sig Start Date End Date Taking? Authorizing Provider  ALPRAZolam Prudy Feeler) 0.5 MG tablet Take 0.25-0.5 mg by mouth daily as needed for anxiety.  11/18/14  Yes Historical Provider, MD  bu[facility]) 100  MG tablet Take 100 mg by mouth 2 (two) times daily.   Yes Historical Provider, MD  cyanocobalamin (,VITAMIN B-12,) 1000 MCG/ML injection Inject 1,000 mcg into the muscle every 30 (thirty) days. Las dose was on 03-17-15 03/07/15  Yes Historical Provider, MD  gabapentin (NEURONTIN) 600 MG tablet Take 1 tablet (600 mg total) by mouth 3 (three) times daily. 03/06/15  Yes York Spaniel, MD  Vitamin D, Ergocalciferol, (DRISDOL) 50000 UNITS CAPS capsule Inject 50,000 Units as directed every 30  (thirty) days. Last dose was on 03-17-15 11/20/14  Yes Historical Provider, MD      Allergies  Allergen Reactions  . Ciprofloxacin Hives    Sweating, dizziness  . Nortriptyline     GERD  . Topamax [Topiramate]     ROS:  Out of a complete 14 system review of symptoms, the patient complains only of the following symptoms, and all other reviewed systems are negative.  Decreased activity, fatigue Facial swelling, ringing in the ears, drooling Light sensitivity Shortness of breath Heat intolerance, excessive thirst, excessive eating Abdominal pain, nausea, vomiting Insomnia, frequent waking, daytime sleepiness, snoring Difficulty urinating Joint pain, back pain Bruising easily Memory loss, dizziness, headache, seizure, speech difficulty, weakness Agitation, confusion, depression, anxiety  Blood pressure 116/82, pulse 83, height  (1.6 m), weight 191 lb 9.6 oz (86.909 kg).  Physical Exam  General: The patient is alert and cooperative at the time of the examination. The patient is moderately obese.  Eyes: Pupils are equal, round, and reactive to light. Discs are flat bilaterally.  Neck: The neck is supple, no carotid bruits are noted.  Respiratory: The respiratory examination is clear.  Cardiovascular: The cardiovascular examination reveals a regular rate and rhythm, no obvious murmurs or rubs are noted.  Neuromuscular: Range of movement of the cervical spine is full.  Skin: Extremities are without significant edema.  Neurologic Exam  Mental status: The patient is alert and oriented x 3 at the time of the examination. The patient has apparent normal recent and remote memory, with an apparently normal attention span and concentration ability.  Cranial nerves: Facial symmetry is present. There is good sensation of the face to pinprick and soft touch on the right face, decreased on the left. The patient splits the midline of the forehead with vibration sensation, decreased  on the left. The strength of the facial muscles and the muscles to head turning and shoulder shrug are normal bilaterally. Speech is well enunciated, no aphasia or dysarthria is noted. Extraocular movements are full. Visual fields are full. The tongue is midline, and the patient has symmetric elevation of the soft palate. No obvious hearing deficits are noted.  Motor: The motor testing reveals 5 over 5 strength of all 4 extremities. Good symmetric motor tone is noted throughout.  Sensory: Sensory testing is intact to pinprick, soft touch, vibration sensation, and position sense the right extremities, decreased on the left. No evidence of extinction is noted.  Coordination: Cerebellar testing reveals good finger-nose-finger and heel-to-shin bilaterally.  Gait and station: Gait is normal. Tandem gait is normal. Romberg is negative. No drift is seen.  Reflexes: Deep tendon reflexes are symmetric and normal bilaterally. Toes are downgoing bilaterally.   MRI brain 03/25/15:  IMPRESSION: Normal MRI head without contrast  Normal MRV head  * MRI scan images were reviewed online. I agree with the written report.    Assessment/Plan:  1. Common migraine headache, intractable  2. Cervicogenic headache  3. Anxiety and depression  4. Nonorganic  clinical examination  5. Fibromyalgia  6. Transient altered mental status  This patient appears to be having increased problems with anxiety and depression leading to increased somatic symptoms. The patient is reporting a left hemisensory deficit that likely is nonorganic, recent MRI of the brain is normal. The patient is having increased headache that is primarily a tension-type headache, cervicogenic. The patient does have fibromyalgia as well. The patient will be placed on tizanidine, she will continue the gabapentin. The patient is on Wellbutrin for depression. She also takes alprazolam. Given the recent episode of trembling and amnesia, EEG  evaluation will be done. She will follow-up in 3 months.  Marlan Palau. Keith Alysia Scism MD 04/01/2015 7:41 PM  [facility] [address] [place], [place] [id number]  Phone [phone] Fax [phone]

## 2015-04-01 NOTE — Telephone Encounter (Signed)
Left message x 3. JSY 

## 2015-04-01 NOTE — Patient Instructions (Addendum)
   You may begin taking magnesium oxide supplementation, 250 mg at night, watch out for diarrhea. Start taking riboflavin, 400 mg daily. We will start tizanidine for the headaches.  Migraine Headache A migraine headache is an intense, throbbing pain on one or both sides of your head. A migraine can last for 30 minutes to several hours. CAUSES  The exact cause of a migraine headache is not always known. However, a migraine may be caused when nerves in the brain become irritated and release chemicals that cause inflammation. This causes pain. Certain things may also trigger migraines, such as:  Alcohol.  Smoking.  Stress.  Menstruation.  Aged cheeses.  Foods or drinks that contain nitrates, glutamate, aspartame, or tyramine.  Lack of sleep.  Chocolate.  Caffeine.  Hunger.  Physical exertion.  Fatigue.  Medicines used to treat chest pain (nitroglycerine), birth control pills, estrogen, and some blood pressure medicines. SIGNS AND SYMPTOMS  Pain on one or both sides of your head.  Pulsating or throbbing pain.  Severe pain that prevents daily activities.  Pain that is aggravated by any physical activity.  Nausea, vomiting, or both.  Dizziness.  Pain with exposure to bright lights, loud noises, or activity.  General sensitivity to bright lights, loud noises, or smells. Before you get a migraine, you may get warning signs that a migraine is coming (aura). An aura may include:  Seeing flashing lights.  Seeing bright spots, halos, or zigzag lines.  Having tunnel vision or blurred vision.  Having feelings of numbness or tingling.  Having trouble talking.  Having muscle weakness. DIAGNOSIS  A migraine headache is often diagnosed based on:  Symptoms.  Physical exam.  A CT scan or MRI of your head. These imaging tests cannot diagnose migraines, but they can help rule out other causes of headaches. TREATMENT Medicines may be given for pain and nausea.  Medicines can also be given to help prevent recurrent migraines.  HOME CARE INSTRUCTIONS  Only take over-the-counter or prescription medicines for pain or discomfort as directed by your health care provider. The use of long-term narcotics is not recommended.  Lie down in a dark, quiet room when you have a migraine.  Keep a journal to find out what may trigger your migraine headaches. For example, write down:  What you eat and drink.  How much sleep you get.  Any change to your diet or medicines.  Limit alcohol consumption.  Quit smoking if you smoke.  Get 7-9 hours of sleep, or as recommended by your health care provider.  Limit stress.  Keep lights dim if bright lights bother you and make your migraines worse. SEEK IMMEDIATE MEDICAL CARE IF:   Your migraine becomes severe.  You have a fever.  You have a stiff neck.  You have vision loss.  You have muscular weakness or loss of muscle control.  You start losing your balance or have trouble walking.  You feel faint or pass out.  You have severe symptoms that are different from your first symptoms. MAKE SURE YOU:   Understand these instructions.  Will watch your condition.  Will get help right away if you are not doing well or get worse. Document Released: 10/18/2005 Document Revised: 03/04/2014 Document Reviewed: 06/25/2013 [facility] Patient Information 2015 [employer] [place]. This information is not intended to replace advice given to you by your health care provider. Make sure you discuss any questions you have with your health care provider.

## 2015-04-02 ENCOUNTER — Telehealth: Payer: Self-pay | Admitting: Neurology

## 2015-04-02 ENCOUNTER — Ambulatory Visit (INDEPENDENT_AMBULATORY_CARE_PROVIDER_SITE_OTHER): Payer: BLUE CROSS/BLUE SHIELD | Admitting: Neurology

## 2015-04-02 DIAGNOSIS — R404 Transient alteration of awareness: Secondary | ICD-10-CM | POA: Diagnosis not present

## 2015-04-02 DIAGNOSIS — M797 Fibromyalgia: Secondary | ICD-10-CM

## 2015-04-02 DIAGNOSIS — G43019 Migraine without aura, intractable, without status migrainosus: Secondary | ICD-10-CM

## 2015-04-02 MED ORDER — ISOMETHEPTENE-DICHLORAL-APAP 65-100-325 MG PO CAPS: 1.0000 | ORAL_CAPSULE | Freq: Four times a day (QID) | ORAL | Status: DC | PRN

## 2015-04-02 NOTE — Telephone Encounter (Signed)
3 messages left for pt. Pt never returned call. Encounter closed. JSY

## 2015-04-02 NOTE — Procedures (Signed)
    History:  Mary Parker is a 34 year old patient with a history of migraine headaches and fibromyalgia. The patient had an episode on 03/19/2015 with a trembling episode and the patient indicates that she had amnesia for events 5 days afterwards. The patient is being evaluated for possible seizures.  This is a routine EEG. No skull defects are noted. Medications include Xanax, Wellbutrin, vitamin B12, Neurontin, Zanaflex, and vitamin D.   EEG classification: Normal awake and drowsy  Description of the recording: The background rhythms of this recording consists of a fairly well modulated medium amplitude alpha rhythm of 8 Hz that is reactive to eye opening and closure. As the record progresses, the patient appears to remain in the waking state throughout the recording. Photic stimulation was performed, resulting in a bilateral and symmetric photic driving response. Hyperventilation was also performed, resulting in a minimal buildup of the background rhythm activities without significant slowing seen. Toward the end of the recording, the patient enters the drowsy state with slight symmetric slowing seen. The patient never enters stage II sleep. At no time during the recording does there appear to be evidence of spike or spike wave discharges or evidence of focal slowing. EKG monitor shows no evidence of cardiac rhythm abnormalities with a heart rate of 78.  Impression: This is a normal EEG recording in the waking and drowsy state. No evidence of ictal or interictal discharges are seen.

## 2015-04-02 NOTE — Telephone Encounter (Signed)
I called the patient. The EEG study was unremarkable. I will call in a prescription for Midrin to take for the patient if needed for her migraine. Imitrex previously did not work.

## 2015-04-03 NOTE — Telephone Encounter (Signed)
Patient called stating that [facility] did not get the script for Midrin that Dr. Anne HahnWillis was going to call in yesterday 04/02/15. Patient can be reached @ [phone]

## 2015-04-03 NOTE — Telephone Encounter (Signed)
Patient called pharmacy found medication and she has picked it up.

## 2015-04-07 ENCOUNTER — Emergency Department (HOSPITAL_COMMUNITY): Payer: BLUE CROSS/BLUE SHIELD

## 2015-04-07 ENCOUNTER — Emergency Department (HOSPITAL_COMMUNITY)
Admission: EM | Admit: 2015-04-07 | Discharge: 2015-04-07 | Disposition: A | Discharge status: Home / Self Care | Payer: BLUE CROSS/BLUE SHIELD | Attending: Emergency Medicine | Admitting: Emergency Medicine

## 2015-04-07 ENCOUNTER — Encounter (HOSPITAL_COMMUNITY): Payer: Self-pay | Admitting: Emergency Medicine

## 2015-04-07 DIAGNOSIS — Z720 Tobacco use: Secondary | ICD-10-CM | POA: Insufficient documentation

## 2015-04-07 DIAGNOSIS — F329 Major depressive disorder, single episode, unspecified: Secondary | ICD-10-CM | POA: Diagnosis not present

## 2015-04-07 DIAGNOSIS — Z8742 Personal history of other diseases of the female genital tract: Secondary | ICD-10-CM | POA: Diagnosis not present

## 2015-04-07 DIAGNOSIS — Z79899 Other long term (current) drug therapy: Secondary | ICD-10-CM | POA: Insufficient documentation

## 2015-04-07 DIAGNOSIS — M542 Cervicalgia: Secondary | ICD-10-CM | POA: Insufficient documentation

## 2015-04-07 DIAGNOSIS — G40909 Epilepsy, unspecified, not intractable, without status epilepticus: Secondary | ICD-10-CM | POA: Diagnosis not present

## 2015-04-07 DIAGNOSIS — M797 Fibromyalgia: Secondary | ICD-10-CM | POA: Insufficient documentation

## 2015-04-07 DIAGNOSIS — G43009 Migraine without aura, not intractable, without status migrainosus: Secondary | ICD-10-CM | POA: Diagnosis not present

## 2015-04-07 DIAGNOSIS — M1990 Unspecified osteoarthritis, unspecified site: Secondary | ICD-10-CM | POA: Diagnosis not present

## 2015-04-07 DIAGNOSIS — F419 Anxiety disorder, unspecified: Secondary | ICD-10-CM | POA: Diagnosis not present

## 2015-04-07 DIAGNOSIS — M549 Dorsalgia, unspecified: Secondary | ICD-10-CM | POA: Diagnosis not present

## 2015-04-07 DIAGNOSIS — E559 Vitamin D deficiency, unspecified: Secondary | ICD-10-CM | POA: Diagnosis not present

## 2015-04-07 DIAGNOSIS — G8929 Other chronic pain: Secondary | ICD-10-CM | POA: Diagnosis not present

## 2015-04-07 DIAGNOSIS — G4700 Insomnia, unspecified: Secondary | ICD-10-CM | POA: Diagnosis not present

## 2015-04-07 LAB — CBC
HCT: 40.4 % (ref 36.0–46.0)
HEMOGLOBIN: 12.9 g/dL (ref 12.0–15.0)
MCH: 31 pg (ref 26.0–34.0)
MCHC: 31.9 g/dL (ref 30.0–36.0)
MCV: 97.1 fL (ref 78.0–100.0)
Platelets: 171 10*3/uL (ref 150–400)
RBC: 4.16 MIL/uL (ref 3.87–5.11)
RDW: 13.5 % (ref 11.5–15.5)
WBC: 9.5 10*3/uL (ref 4.0–10.5)

## 2015-04-07 LAB — BASIC METABOLIC PANEL
Anion gap: 8 (ref 5–15)
BUN: 8 mg/dL (ref 6–20)
CO2: 25 mmol/L (ref 22–32)
Calcium: 8.8 mg/dL — ABNORMAL LOW (ref 8.9–10.3)
Chloride: 105 mmol/L (ref 101–111)
Creatinine, Ser: 0.92 mg/dL (ref 0.44–1.00)
GFR calc Af Amer: >60 mL/min (ref >60)
GFR calc non Af Amer: >60 mL/min (ref >60)
Glucose, Bld: 80 mg/dL (ref 65–99)
POTASSIUM: 4 mmol/L (ref 3.5–5.1)
SODIUM: 138 mmol/L (ref 135–145)

## 2015-04-07 MED ORDER — ONDANSETRON HCL 4 MG/2ML IJ SOLN
4.0000 mg | Freq: Once | INTRAMUSCULAR | Status: AC
  Administered 2015-04-07: 4 mg via INTRAVENOUS
  Filled 2015-04-07: qty 2

## 2015-04-07 MED ORDER — SODIUM CHLORIDE 0.9 % IV BOLUS (SEPSIS)
1000.0000 mL | Freq: Once | INTRAVENOUS | Status: AC
  Administered 2015-04-07: 1000 mL via INTRAVENOUS

## 2015-04-07 MED ORDER — OXYCODONE-ACETAMINOPHEN 5-325 MG PO TABS
2.0000 | ORAL_TABLET | Freq: Once | ORAL | Status: AC
  Administered 2015-04-07: 2 via ORAL
  Filled 2015-04-07: qty 2

## 2015-04-07 MED ORDER — SODIUM CHLORIDE 0.9 % IV SOLN: Freq: Once | INTRAVENOUS | Status: DC

## 2015-04-07 MED ORDER — HYDROMORPHONE HCL 1 MG/ML IJ SOLN
1.0000 mg | Freq: Once | INTRAMUSCULAR | Status: AC
  Administered 2015-04-07: 1 mg via INTRAVENOUS
  Filled 2015-04-07: qty 1

## 2015-04-07 NOTE — ED Notes (Signed)
Having pain to neck since last Thursday.  Was seen by Thursday Dr Mary Parker and MRI is scheduled but waiting on insurance first.  Pt has been on ice for weekend.  Rates pain 10/10.  Have not taken any pain medication today

## 2015-04-07 NOTE — ED Notes (Signed)
Patient ambulated in room with minimal assistance in room.

## 2015-04-07 NOTE — Discharge Instructions (Signed)
Back Pain: ° ° °Your back pain should be treated with medicines such as ibuprofen or aleve and this back pain should get better over the next 2 weeks.  However if you develop severe or worsening pain, low back pain with fever, numbness, weakness or inability to walk or urinate, you should return to the ER immediately.  Please follow up with your doctor this week for a recheck if still having symptoms. °Low back pain is discomfort in the lower back that may be due to injuries to muscles and ligaments around the spine.  Occasionally, it may be caused by a a problem to a part of the spine called a disc.  The pain may last several days or a week;  However, most patients get completely well in 4 weeks. ° °Self - care:  The application of heat can help soothe the pain.  Maintaining your daily activities, including walking, is encourged, as it will help you get better faster than just staying in bed. ° °Medications are also useful to help with pain control.  A commonly prescribed medications includes acetaminophen.  This medication is generally safe, though you should not take more than 8 of the extra strength (500mg) pills a day. ° °Non steroidal anti inflammatory medications including Ibuprofen and naproxen;  These medications help both pain and swelling and are very useful in treating back pain.  They should be taken with food, as they can cause stomach upset, and more seriously, stomach bleeding.   ° °Muscle relaxants:  These medications can help with muscle tightness that is a cause of lower back pain.  Most of these medications can cause drowsiness, and it is not safe to drive or use dangerous machinery while taking them. ° °You will need to follow up with  Your primary healthcare provider in 1-2 weeks for reassessment. ° °Be aware that if you develop new symptoms, such as a fever, leg weakness, difficulty with or loss of control of your urine or bowels, abdominal pain, or more severe pain, you will need to seek  medical attention and  / or return to the Emergency department. ° °If you do not have a doctor see the list below. ° °RESOURCE GUIDE ° °Chronic Pain Problems: °Contact [facility]  297-2271 °Patients need to be referred by their primary care doctor. ° °Insufficient Money for Medicine: °Contact United Way:  call "211" or [facility] 271-5999. ° °No Primary Care Doctor: °- Call [facility]  832-8000 - can help you locate a primary care doctor that  accepts your insurance, provides certain services, etc. °- Physician Referral Service- 1-800-533-3463 ° °Agencies that provide inexpensive medical care: °- [facility]  832-8035 °- [facility]  832-7272 °- [facility]  [phone] °- [facility]  832-4777 °- [facility]  373-0678 °- [facility]  [phone] ° °Medicaid-accepting [place] Providers: °- [facility]- [address] ° [phone], Mon-Fri 9am-7pm, Sat 9am-1pm °- [facility]- [address], [address] ° [phone] °- [facility]- [address], [address] ° [phone] °- [facility]- [address] [place] [address] ° [phone] °- [facility]- [address], [phone] ° Only accepts [place] Access Medicaid patients after they have their name  applied to their card ° °Self Pay (no insurance) in [place]: °- Sickle Cell Patients: Dr Eric Dean, [facility] °   [address], [phone] °- [facility]- [address] ° [phone] °      -     [facility] - [address] °      -     [facility]- see information above (Speak to Pam H if you do not have insurance) °      -  [facility]- [address], [phone] °      -  [facility] [place]- [address],  [phone] °      -  [facility]- [address] [place] [address],  [phone] °      -  Dr Osei-Bonsu-  [address], [place], [phone] °      -  [facility]- [address], [phone] °      -  [facility]- [address] [place] [address], [phone], also [address], [phone] °      -    [facility]- [address], [phone], 1st & 3rd Saturday   every month, 10am-1pm ° °1) Find a Doctor and Pay Out of Pocket °Although you won't have to find out who is covered by your insurance plan, it is a good idea to ask around and get recommendations. You will then need to call the office and see if the doctor you have chosen will accept you as a new patient and what types of options they offer for patients who are self-pay. Some doctors offer discounts or will set up payment plans for their patients who do not have insurance, but you will need to ask so you aren't surprised when you get to your appointment. ° °2) Contact Your Local Health Department °Not all health departments have doctors that can see patients for sick visits, but many do, so it is worth a call to see if yours does. If you don't know where your local health department is, you can check in your phone book. The CDC also has a tool to help you locate your state's health department, and many state websites also have listings of all of their local health departments. ° °3) Find a Walk-in Clinic °If your illness is not likely to be very severe or complicated, you may want to try a walk in clinic. These are popping up all over the country in pharmacies, drugstores, and shopping centers. They're usually staffed by nurse practitioners or physician assistants that have been trained to treat common illnesses and complaints. They're usually fairly quick and inexpensive. However, if you have serious medical issues or chronic medical problems, these are probably not your best option ° °STD Testing °- [place] [facility] [place], [facility], [address], [place], phone [phone] or 1-877-539-9860.  Monday - Friday, call for an appointment. °- [place] [facility] [place], [facility], [address], [place], phone [phone] or 1-877-539-9860.  Monday - Friday, call for an appointment. ° °Abuse/Neglect: °- [place] Child Abuse Hotline (336) 641-3795 °- [place] Child Abuse Hotline 800-378-5315 (After Hours) ° °Emergency Shelter:  [facility] (336) 271-5985 ° °Maternity Homes: °- [facility] (336) 275-9566 °- [facility] (704) 372-4663 ° °MRSA Hotline #:   832-7006 ° °[place] Resources ° °[facility] [place]  [employer] [place] [facility] °[address]                 [address]         [address]  °[place]                                               [place]                              [place] °Phone:  349-3220                                  Phone:  [phone]                   Phone:  342-8140 ° °[place] [facility], 342-8316 °- [place] [facility] - [facility]- 1-888-581-9988 °      -     [facility] in [place], [address],                                  336-349-4454, Insurance ° °[place] Child Abuse Hotline °(336) 342-1394 or (336) 342-3537 (After Hours) ° ° °Behavioral Health Services ° °Substance Abuse Resources: °- [facility]  336-882-2125 °- [facility] 336-784-9470 °- [facility] 336-285-9073 °- [facility] 336-845-3988 °- Residential & Outpatient Substance Abuse Program  800-659-3381 ° °Psychological Services: °- [facility]  832-9600 °- [facility]  378-7881 °- [facility], [address], [place], ACCESS LINE: 1-800-853-5163 or 336-641-4981, Http://[email or web address]/adult.htm ° °Dental Assistance ° °If unable to pay or  uninsured, contact:  [facility] or [place] [facility] to become qualified for the adult dental clinic. ° °Patients with Medicaid: [facility] °[address], [phone] °[address], [phone] ° °If unable to pay, or uninsured, contact [facility] (271-5999) or [place] [facility] (641-3152 in [place], 842-7733 in [place]) to become qualified for the adult dental clinic ° °Other Low-Cost Community Dental Services: °- [facility]- [address], [place], [place], [address], 723-1848, [address]. [address], 2nd and 4th Thursday of the month at 6:30am.  10 clients each day by appointment, can sometimes see walk-in patients if someone does not show for an appointment. °- [facility]- [address], [place], [place], [address], [phone] °- [facility]- [address], [place], [place], [address], [phone] °- [facility] [facility]- 342-8273 °- [facility]- [phone] °- [facility]- 570-6415 ° ° ° ° ° ° °
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
## 2015-04-07 NOTE — ED Notes (Signed)
Patient requesting something to help with pain, until she can follow-up with Dr Sherwood GamblerFusco. Dr Hyacinth MeekerMiller made aware.

## 2015-04-08 ENCOUNTER — Telehealth: Payer: Self-pay | Admitting: Neurology

## 2015-04-08 DIAGNOSIS — S161XXS Strain of muscle, fascia and tendon at neck level, sequela: Secondary | ICD-10-CM

## 2015-04-08 DIAGNOSIS — M797 Fibromyalgia: Secondary | ICD-10-CM

## 2015-04-08 MED ORDER — PREGABALIN 25 MG PO CAPS: ORAL_CAPSULE | ORAL | Status: DC

## 2015-04-08 NOTE — Telephone Encounter (Signed)
I called the patient. The patient indicates that she is having ongoing significant discomfort, the pain now is mainly in her neck area, her neck is very stiff. The patient will be set up for physical therapy on the neck, and she will be placed on Lyrica. She is coming off of Wellbutrin as this worsened her depression.

## 2015-04-08 NOTE — Telephone Encounter (Signed)
Patient called stating fibromyalgia is getting worse. She is having wide spread pain. She had an MRI yesterday. PCP mentioned to her possibly MS or spinal stenosis. She is upset with not knowing what is wrong and what is going on with her body. Please call and advise. Patient can be reached at [phone].

## 2015-04-08 NOTE — Telephone Encounter (Signed)
I called the patient. She stated that she is in extreme pain all over. She c/o that her neck hurts so bad that she cannot move it side to side. She states that she is dizzy, has confusion, can barely walk and feels like she is rocking/shaking when sitting/lying still. She stated that her PCP ordered several MRIs, but she was unable to have them done before he went out of town. He wanted to explore options other than fibromyalgia, such as MS and spinal stenosis, per patient. His office advised she go to the ED yesterday (6/6) since she was in so much pain. It looks like the MRI they did was negative for spinal stenosis. She wants to know what else can be done.

## 2015-04-09 ENCOUNTER — Emergency Department (HOSPITAL_COMMUNITY)
Admission: EM | Admit: 2015-04-09 | Discharge: 2015-04-09 | Disposition: A | Discharge status: Home / Self Care | Payer: BLUE CROSS/BLUE SHIELD | Attending: Emergency Medicine | Admitting: Emergency Medicine

## 2015-04-09 ENCOUNTER — Telehealth: Payer: Self-pay | Admitting: Neurology

## 2015-04-09 ENCOUNTER — Emergency Department (HOSPITAL_COMMUNITY): Payer: BLUE CROSS/BLUE SHIELD

## 2015-04-09 ENCOUNTER — Encounter (HOSPITAL_COMMUNITY): Payer: Self-pay | Admitting: Emergency Medicine

## 2015-04-09 DIAGNOSIS — G43909 Migraine, unspecified, not intractable, without status migrainosus: Secondary | ICD-10-CM | POA: Insufficient documentation

## 2015-04-09 DIAGNOSIS — M1990 Unspecified osteoarthritis, unspecified site: Secondary | ICD-10-CM | POA: Insufficient documentation

## 2015-04-09 DIAGNOSIS — Y998 Other external cause status: Secondary | ICD-10-CM | POA: Insufficient documentation

## 2015-04-09 DIAGNOSIS — S6991XA Unspecified injury of right wrist, hand and finger(s), initial encounter: Secondary | ICD-10-CM | POA: Insufficient documentation

## 2015-04-09 DIAGNOSIS — Y9389 Activity, other specified: Secondary | ICD-10-CM | POA: Insufficient documentation

## 2015-04-09 DIAGNOSIS — Z87448 Personal history of other diseases of urinary system: Secondary | ICD-10-CM | POA: Insufficient documentation

## 2015-04-09 DIAGNOSIS — F329 Major depressive disorder, single episode, unspecified: Secondary | ICD-10-CM | POA: Insufficient documentation

## 2015-04-09 DIAGNOSIS — Z720 Tobacco use: Secondary | ICD-10-CM | POA: Insufficient documentation

## 2015-04-09 DIAGNOSIS — F419 Anxiety disorder, unspecified: Secondary | ICD-10-CM | POA: Insufficient documentation

## 2015-04-09 DIAGNOSIS — S8001XA Contusion of right knee, initial encounter: Secondary | ICD-10-CM | POA: Insufficient documentation

## 2015-04-09 DIAGNOSIS — W1839XA Other fall on same level, initial encounter: Secondary | ICD-10-CM | POA: Diagnosis not present

## 2015-04-09 DIAGNOSIS — Z79899 Other long term (current) drug therapy: Secondary | ICD-10-CM | POA: Insufficient documentation

## 2015-04-09 DIAGNOSIS — G40909 Epilepsy, unspecified, not intractable, without status epilepticus: Secondary | ICD-10-CM | POA: Insufficient documentation

## 2015-04-09 DIAGNOSIS — Y9289 Other specified places as the place of occurrence of the external cause: Secondary | ICD-10-CM | POA: Insufficient documentation

## 2015-04-09 DIAGNOSIS — W19XXXA Unspecified fall, initial encounter: Secondary | ICD-10-CM

## 2015-04-09 DIAGNOSIS — Z8639 Personal history of other endocrine, nutritional and metabolic disease: Secondary | ICD-10-CM | POA: Insufficient documentation

## 2015-04-09 DIAGNOSIS — S8991XA Unspecified injury of right lower leg, initial encounter: Secondary | ICD-10-CM | POA: Diagnosis present

## 2015-04-09 MED ORDER — OXYCODONE-ACETAMINOPHEN 5-325 MG PO TABS: 1.0000 | ORAL_TABLET | Freq: Four times a day (QID) | ORAL | Status: DC | PRN

## 2015-04-09 MED ORDER — OXYCODONE-ACETAMINOPHEN 5-325 MG PO TABS
1.0000 | ORAL_TABLET | Freq: Once | ORAL | Status: AC
  Administered 2015-04-09: 1 via ORAL
  Filled 2015-04-09: qty 1

## 2015-04-09 MED ORDER — DULOXETINE HCL 30 MG PO CPEP: 30.0000 mg | ORAL_CAPSULE | Freq: Every day | ORAL | Status: DC

## 2015-04-09 NOTE — Telephone Encounter (Signed)
I talk with Dr. Estell HarpinZammit from [place]. The patient has gone the emergency room today after collapsing on the left side. The patient has nonorganic features to the clinical examination. While in the ER, she indicates that she has not been able to afford Lyrica, indicates a $100 co-pay. I will switch over to low-dose Cymbalta.

## 2015-04-09 NOTE — ED Notes (Addendum)
Pt is tearful, can not afford new fibro medication, pt states she is coming off wellbutrin,  denies SI, state she is tried of the struggle.  Has 2 jobs not able to work either job, No income, No child support, 3 children.

## 2015-04-09 NOTE — Discharge Instructions (Signed)
Follow up with dr. Sherwood Gamblerfusco if problems with knee.  Call dr. Anne HahnWillis if any other problems.  Try to get the prescription he calls in

## 2015-04-09 NOTE — ED Notes (Signed)
Pt has multiple complaints, states she has had multiple visits and keeps being dismissed without results. Pt continues to hurt on left side, vomiting, migraine, states she fell today on the right side when walking to car at bank. Pt has abrasion to right knee and leg

## 2015-04-09 NOTE — ED Notes (Signed)
Pt alert & oriented x4, stable gait. Patient given discharge instructions, paperwork & prescription(s). Patient  instructed to stop at the registration desk to finish any additional paperwork. Patient verbalized understanding. Pt left department w/ no further questions. 

## 2015-04-09 NOTE — ED Provider Notes (Signed)
CSN: [id number]     Arrival date & time 04/09/15  1234 History   First MD Initiated Contact with Patient 04/09/15 1304     Chief Complaint  Patient presents with  . Fall     (Consider location/radiation/quality/duration/timing/severity/associated sxs/prior Treatment) Patient is a 34 y.o. female presenting with fall. The history is provided by the patient (the pt fell and hurt her right knee and right hand).  Fall This is a new problem. The current episode started 1 to 2 hours ago. The problem occurs constantly. The problem has not changed since onset.Pertinent negatives include no abdominal pain and no headaches. Exacerbated by: movement of knee.    Past Medical History  Diagnosis Date  . Anxiety   . Migraines   . Fibrocystic disease of both breasts   . Arthritis     both knees  . Depression 12/11/2014  . Body aches 12/11/2014  . Common migraine 12/19/2014  . Fibromyalgia 12/19/2014  . IBS (irritable bowel syndrome)   . Vitamin D deficiency   . Chronic insomnia   . Rebound headache 12/19/2014  . Seizures    Past Surgical History  Procedure Laterality Date  . Cesarean section  [phone]  . Cholecystectomy  2001  . Tubal ligation    . Endometrial ablation     Family History  Problem Relation Age of Onset  . Hypertension Mother   . Other Mother     abnormal cells; had hyst  . Obesity Daughter   . Obesity Son   . Cancer Maternal Grandmother   . Osteoporosis Maternal Grandmother   . Cancer Maternal Grandfather   . Obesity Daughter   . Diabetes Father   . Hypertension Father   . Alcohol abuse Father    History  Substance Use Topics  . Smoking status: Current Every Day Smoker -- 1.00 packs/day for 0 years    Types: Cigarettes  . Smokeless tobacco: Never Used  . Alcohol Use: No   OB History    Gravida Para Term Preterm AB TAB SAB Ectopic Multiple Living   Review of Systems  Constitutional: Negative for appetite change and fatigue.  HENT:  Negative for congestion, ear discharge and sinus pressure.   Eyes: Negative for discharge.  Respiratory: Negative for cough.   Gastrointestinal: Negative for abdominal pain and diarrhea.  Genitourinary: Negative for frequency and hematuria.  Musculoskeletal: Negative for back pain.       Right hand and knee pain  Skin: Negative for rash.  Neurological: Negative for seizures and headaches.  Psychiatric/Behavioral: Negative for hallucinations.      Allergies  Ciprofloxacin; Nortriptyline; and Topamax  Home Medications   Prior to Admission medications   Medication Sig Start Date End Date Taking? Authorizing Provider  ALPRAZolam Prudy Feeler) 0.5 MG tablet Take 0.5 mg by mouth 3 (three) times daily as needed for anxiety.  11/18/14  Yes Historical Provider, MD  cyanocobalamin (,VITAMIN B-12,) 1000 MCG/ML injection Inject 1,000 mcg into the muscle every 30 (thirty) days. Las dose was on 03-17-15 03/07/15  Yes Historical Provider, MD  HYDROcodone-acetaminophen (NORCO) 10-325 MG per tablet Take 1 tablet by mouth every 6 (six) hours as needed for moderate pain.   Yes Historical Provider, MD  ibuprofen (ADVIL,MOTRIN) 200 MG tablet Take 600 mg by mouth every 6 (six) hours as needed for moderate pain.    Yes Historical Provider, MD  isometheptene-acetaminophen-dichloralphenazone (MIDRIN) [phone] MG capsule Take 1 capsule  by mouth 4 (four) times daily as needed for migraine. 04/02/15  Yes York Spanielharles K Willis, MD  methocarbamol (ROBAXIN) 500 MG tablet Take 500 mg by mouth 3 (three) times daily. 04/03/15  Yes Historical Provider, MD  mirtazapine (REMERON) 15 MG tablet Take 15 mg by mouth at bedtime.   Yes Historical Provider, MD  tiZANidine (ZANAFLEX) 2 MG tablet One tablet twice a day for 1 week, then take 1 tablet 3 times a day 04/01/15  Yes York Spanielharles K Willis, MD  Vitamin D, Ergocalciferol, (DRISDOL) 50000 UNITS CAPS capsule Inject 50,000 Units as directed every 30 (thirty) days. Last dose was on 03-17-15 11/20/14   Yes Historical Provider, MD  DULoxetine (CYMBALTA) 30 MG capsule Take 1 capsule (30 mg total) by mouth daily. 04/09/15   York Spanielharles K Willis, MD  gabapentin (NEURONTIN) 600 MG tablet Take 1 tablet (600 mg total) by mouth 3 (three) times daily. Patient not taking: Reported on 04/09/2015 03/06/15   York Spanielharles K Willis, MD  oxyCODONE-acetaminophen (PERCOCET/ROXICET) 5-325 MG per tablet Take 1 tablet by mouth every 6 (six) hours as needed. 04/09/15   Bethann BerkshireJoseph Alga Southall, MD   BP 103/74 mmHg  Pulse 90  Temp(Src) 98.1 F (36.7 C) (Oral)  Resp 18  Ht 5\' 4"  (1.626 m)  Wt 190 lb (86.183 kg)  BMI 32.60 kg/m2  SpO2 100% Physical Exam  Constitutional: She is oriented to person, place, and time. She appears well-developed.  HENT:  Head: Normocephalic.  Eyes: Conjunctivae and EOM are normal. No scleral icterus.  Neck: Neck supple. No thyromegaly present.  Cardiovascular: Normal rate and regular rhythm.  Exam reveals no gallop and no friction rub.   No murmur heard. Pulmonary/Chest: No stridor. She has no wheezes. She has no rales. She exhibits no tenderness.  Abdominal: She exhibits no distension. There is no tenderness. There is no rebound.  Musculoskeletal: Normal range of motion. She exhibits no edema.  Mild tenderness and bruising to right knee and hand  Lymphadenopathy:    She has no cervical adenopathy.  Neurological: She is oriented to person, place, and time. She exhibits normal muscle tone. Coordination normal.  Skin: No rash noted. No erythema.  Psychiatric: She has a normal mood and affect. Her behavior is normal.    ED Course  Procedures (including critical care time) Labs Review Labs Reviewed - No data to display  Imaging Review Dg Knee Complete 4 Views Right  04/09/2015   CLINICAL DATA:  Post fall walking to car today now with abrasion involving the right knee. Initial encounter.  EXAM: RIGHT KNEE - COMPLETE 4+ VIEW  COMPARISON:  None.  FINDINGS: No fracture or dislocation. Joint spaces are  preserved. No evidence of chondrocalcinosis. No joint effusion. There is minimal enthesopathic change involving the superior pole of the patella. Regional soft tissues appear normal. No radiopaque foreign body.  IMPRESSION: Grossly normal radiographs of the right knee.   Electronically Signed   By: Simonne ComeJohn  Watts M.D.   On: 04/09/2015 14:23     EKG Interpretation None      MDM   Final diagnoses:  Fall  Knee contusion, right, initial encounter   Multiple falls,  Spoke with dr. Lesia SagoKeith Willis her neurologist,  He will prescribe cymbalta and pt will also get some percocet for knee bruise    Bethann BerkshireJoseph Rohith Fauth, MD 04/09/15 1504

## 2015-04-21 ENCOUNTER — Ambulatory Visit: Payer: BLUE CROSS/BLUE SHIELD | Admitting: Nurse Practitioner

## 2015-04-30 ENCOUNTER — Ambulatory Visit: Payer: BLUE CROSS/BLUE SHIELD | Admitting: Nurse Practitioner

## 2015-05-07 ENCOUNTER — Emergency Department (HOSPITAL_COMMUNITY)
Admission: EM | Admit: 2015-05-07 | Discharge: 2015-05-08 | Disposition: A | Discharge status: Home / Self Care | Payer: BLUE CROSS/BLUE SHIELD | Attending: Emergency Medicine | Admitting: Emergency Medicine

## 2015-05-07 ENCOUNTER — Encounter (HOSPITAL_COMMUNITY): Payer: Self-pay | Admitting: Emergency Medicine

## 2015-05-07 DIAGNOSIS — G40909 Epilepsy, unspecified, not intractable, without status epilepticus: Secondary | ICD-10-CM | POA: Diagnosis not present

## 2015-05-07 DIAGNOSIS — R51 Headache: Secondary | ICD-10-CM | POA: Diagnosis present

## 2015-05-07 DIAGNOSIS — Z720 Tobacco use: Secondary | ICD-10-CM | POA: Diagnosis not present

## 2015-05-07 DIAGNOSIS — Z79899 Other long term (current) drug therapy: Secondary | ICD-10-CM | POA: Insufficient documentation

## 2015-05-07 DIAGNOSIS — M170 Bilateral primary osteoarthritis of knee: Secondary | ICD-10-CM | POA: Insufficient documentation

## 2015-05-07 DIAGNOSIS — Z8742 Personal history of other diseases of the female genital tract: Secondary | ICD-10-CM | POA: Diagnosis not present

## 2015-05-07 DIAGNOSIS — G4700 Insomnia, unspecified: Secondary | ICD-10-CM | POA: Diagnosis not present

## 2015-05-07 DIAGNOSIS — Z8719 Personal history of other diseases of the digestive system: Secondary | ICD-10-CM | POA: Diagnosis not present

## 2015-05-07 DIAGNOSIS — F419 Anxiety disorder, unspecified: Secondary | ICD-10-CM | POA: Diagnosis not present

## 2015-05-07 DIAGNOSIS — G479 Sleep disorder, unspecified: Secondary | ICD-10-CM | POA: Insufficient documentation

## 2015-05-07 DIAGNOSIS — G43809 Other migraine, not intractable, without status migrainosus: Secondary | ICD-10-CM | POA: Insufficient documentation

## 2015-05-07 DIAGNOSIS — E559 Vitamin D deficiency, unspecified: Secondary | ICD-10-CM | POA: Insufficient documentation

## 2015-05-07 DIAGNOSIS — M797 Fibromyalgia: Secondary | ICD-10-CM | POA: Diagnosis not present

## 2015-05-07 DIAGNOSIS — M5442 Lumbago with sciatica, left side: Secondary | ICD-10-CM | POA: Diagnosis not present

## 2015-05-07 DIAGNOSIS — F329 Major depressive disorder, single episode, unspecified: Secondary | ICD-10-CM | POA: Diagnosis not present

## 2015-05-07 MED ORDER — DIPHENHYDRAMINE HCL 50 MG/ML IJ SOLN: 25.0000 mg | Freq: Once | INTRAMUSCULAR | Status: DC

## 2015-05-07 MED ORDER — ONDANSETRON 4 MG PO TBDP
4.0000 mg | ORAL_TABLET | Freq: Once | ORAL | Status: AC
  Administered 2015-05-07: 4 mg via ORAL
  Filled 2015-05-07: qty 1

## 2015-05-07 MED ORDER — KETOROLAC TROMETHAMINE 30 MG/ML IJ SOLN: 30.0000 mg | Freq: Once | INTRAMUSCULAR | Status: DC

## 2015-05-07 MED ORDER — PROCHLORPERAZINE EDISYLATE 5 MG/ML IJ SOLN: 10.0000 mg | Freq: Once | INTRAMUSCULAR | Status: DC

## 2015-05-07 MED ORDER — PROCHLORPERAZINE EDISYLATE 5 MG/ML IJ SOLN
10.0000 mg | Freq: Once | INTRAMUSCULAR | Status: AC
  Administered 2015-05-07: 10 mg via INTRAMUSCULAR
  Filled 2015-05-07: qty 2

## 2015-05-07 MED ORDER — HYDROMORPHONE HCL 1 MG/ML IJ SOLN
1.0000 mg | Freq: Once | INTRAMUSCULAR | Status: AC
Start: 2015-05-07 — End: 2015-05-07
  Administered 2015-05-07: 1 mg via INTRAMUSCULAR
  Filled 2015-05-07: qty 1

## 2015-05-07 NOTE — ED Notes (Signed)
Pt. Given hot pack to place on back.

## 2015-05-07 NOTE — ED Notes (Signed)
Pt c/o headache with vomiting x 2 days and back pain x 2 weeks. Pt states she has seen pcp for back pain.

## 2015-05-07 NOTE — Discharge Instructions (Signed)
Please see Dr Sherwood GamblerFusco for recheck and management of your headaches and back pain as soon as possible. You were treated with a narcotic pain medication tonight. Please use caution getting around. Migraine Headache A migraine headache is very bad, throbbing pain on one or both sides of your head. Talk to your doctor about what things may bring on (trigger) your migraine headaches. HOME CARE  Only take medicines as told by your doctor.  Lie down in a dark, quiet room when you have a migraine.  Keep a journal to find out if certain things bring on migraine headaches. For example, write down:  What you eat and drink.  How much sleep you get.  Any change to your diet or medicines.  Lessen how much alcohol you drink.  Quit smoking if you smoke.  Get enough sleep.  Lessen any stress in your life.  Keep lights dim if bright lights bother you or make your migraines worse. GET HELP RIGHT AWAY IF:   Your migraine becomes really bad.  You have a fever.  You have a stiff neck.  You have trouble seeing.  Your muscles are weak, or you lose muscle control.  You lose your balance or have trouble walking.  You feel like you will pass out (faint), or you pass out.  You have really bad symptoms that are different than your first symptoms. MAKE SURE YOU:   Understand these instructions.  Will watch your condition.  Will get help right away if you are not doing well or get worse. Document Released: 07/27/2008 Document Revised: 01/10/2012 Document Reviewed: 06/25/2013 [facility] Patient Information 2015 [place]. This information is not intended to replace advice given to you by your health care provider. Make sure you discuss any questions you have with your health care provider.  Back Pain, Adult Back pain is very common. The pain often gets better over time. The cause of back pain is usually not dangerous. Most people can learn to manage their back pain on their own.  HOME CARE     Stay active. Start with short walks on flat ground if you can. Try to walk farther each day.  Do not sit, drive, or stand in one place for more than 30 minutes. Do not stay in bed.  Do not avoid exercise or work. Activity can help your back heal faster.  Be careful when you bend or lift an object. Bend at your knees, keep the object close to you, and do not twist.  Sleep on a firm mattress. Lie on your side, and bend your knees. If you lie on your back, put a pillow under your knees.  Only take medicines as told by your doctor.  Put ice on the injured area.  Put ice in a plastic bag.  Place a towel between your skin and the bag.  Leave the ice on for 15-20 minutes, 03-04 times a day for the first 2 to 3 days. After that, you can switch between ice and heat packs.  Ask your doctor about back exercises or massage.  Avoid feeling anxious or stressed. Find good ways to deal with stress, such as exercise. GET HELP RIGHT AWAY IF:   Your pain does not go away with rest or medicine.  Your pain does not go away in 1 week.  You have new problems.  You do not feel well.  The pain spreads into your legs.  You cannot control when you poop (bowel movement) or pee (urinate).  Your arms or legs feel weak or lose feeling (numbness).  You feel sick to your stomach (nauseous) or throw up (vomit).  You have belly (abdominal) pain.  You feel like you may pass out (faint). MAKE SURE YOU:   Understand these instructions.  Will watch your condition.  Will get help right away if you are not doing well or get worse. Document Released: 04/05/2008 Document Revised: 01/10/2012 Document Reviewed: 02/19/2014 [facility] Patient Information 2015 [place], [place]. This information is not intended to replace advice given to you by your health care provider. Make sure you discuss any questions you have with your health care provider.

## 2015-05-07 NOTE — ED Notes (Signed)
Pt. Reports migraine x2 days. Pt. Reports sensitivity to light. Pt. reports taking Fioricet without relief, pt. Reports that Fioricet typically relieves her migraines. Pt. Reports vomiting x2 today. Pt. Reports left lower back pain. Pt. Reports intermittent shooting pain down left leg. Pt. Reports that she was seen by her PCP and given rx for Hydrocodone which she reports has not relieved her pain.

## 2015-05-07 NOTE — ED Provider Notes (Signed)
CSN: [id number]     Arrival date & time 05/07/15  2006 History   First MD Initiated Contact with Patient 05/07/15 2308     Chief Complaint  Patient presents with  . Headache     (Consider location/radiation/quality/duration/timing/severity/associated sxs/prior Treatment) HPI Comments: Patient is a 34 year old female , with a history of rebound headaches, common migraines, fibromyalgia, irritable bowel syndrome, chronic insomnia, seizures, anxiety, arthritis, and low back pain.,who presents to the emergency department with a complaint of a migraine headache, and lower back pain.  The patient states that she has had 2 days of headache mostly in the temporal areas and 4 head. The left side seems to bother her more than the right thumb. She states that light hurts her eyes. She's had vomiting over the last 2 days, and one episode of vomiting just prior to coming to the emergency department. The patient states she usually uses Fioricet but this has not been helping recently. She has been evaluated by neurology for these headaches. His been no recent injury or trauma to the head. No recent changes in medication or diet. Recent changes in environment. Patient denies a thunderclap type headache or "worse headache of her life". This headache is similar to other headaches, but has been more aggressive.  The history is provided by the patient.    Past Medical History  Diagnosis Date  . Anxiety   . Migraines   . Fibrocystic disease of both breasts   . Arthritis     both knees  . Depression 12/11/2014  . Body aches 12/11/2014  . Common migraine 12/19/2014  . Fibromyalgia 12/19/2014  . IBS (irritable bowel syndrome)   . Vitamin D deficiency   . Chronic insomnia   . Rebound headache 12/19/2014  . Seizures    Past Surgical History  Procedure Laterality Date  . Cesarean section  [phone]  . Cholecystectomy  2001  . Tubal ligation    . Endometrial ablation     Family History  Problem Relation  Age of Onset  . Hypertension Mother   . Other Mother     abnormal cells; had hyst  . Obesity Daughter   . Obesity Son   . Cancer Maternal Grandmother   . Osteoporosis Maternal Grandmother   . Cancer Maternal Grandfather   . Obesity Daughter   . Diabetes Father   . Hypertension Father   . Alcohol abuse Father    History  Substance Use Topics  . Smoking status: Current Every Day Smoker -- 1.00 packs/day for 0 years    Types: Cigarettes  . Smokeless tobacco: Never Used  . Alcohol Use: No   OB History    Gravida Para Term Preterm AB TAB SAB Ectopic Multiple Living   3 3        3      Review of Systems  Constitutional: Negative for fever and chills.  Eyes: Positive for photophobia.  Gastrointestinal: Positive for nausea and vomiting.  Musculoskeletal: Positive for back pain.  Neurological: Positive for seizures and headaches. Negative for syncope, facial asymmetry, speech difficulty and weakness.  Psychiatric/Behavioral: Positive for sleep disturbance. The patient is nervous/anxious.   All other systems reviewed and are negative.     Allergies  Chantix; Ciprofloxacin; Nortriptyline; Topamax; and Wellbutrin  Home Medications   Prior to Admission medications   Medication Sig Start Date End Date Taking? Authorizing Provider  ALPRAZolam Prudy Feeler) 0.5 MG tablet Take 0.5 mg by mouth 4 (four) times daily.  11/18/14  Yes Historical Provider,  MD  butalbital-acetaminophen-caffeine (FIORICET, ESGIC) 50-325-40 MG per tablet Take 1 tablet by mouth 4 (four) times daily.   Yes Historical Provider, MD  cyanocobalamin (,VITAMIN B-12,) 1000 MCG/ML injection Inject 1,000 mcg into the muscle every 30 (thirty) days.  03/07/15  Yes Historical Provider, MD  HYDROcodone-acetaminophen (NORCO) 10-325 MG per tablet Take 1 tablet by mouth 4 (four) times daily.    Yes Historical Provider, MD  ibuprofen (ADVIL,MOTRIN) 200 MG tablet Take 600-800 mg by mouth every 6 (six) hours as needed for moderate pain.     Yes Historical Provider, MD  LYRICA 25 MG capsule Take 50 mg by mouth 2 (two) times daily. 04/09/15  Yes Historical Provider, MD  mirtazapine (REMERON) 15 MG tablet Take 30 mg by mouth at bedtime.    Yes Historical Provider, MD  naproxen sodium (ALEVE) 220 MG tablet Take 440 mg by mouth daily as needed (for pain).   Yes Historical Provider, MD  Vitamin D, Ergocalciferol, (DRISDOL) 50000 UNITS CAPS capsule Inject 50,000 Units as directed every 30 (thirty) days. Last dose was on 03-17-15 11/20/14  Yes Historical Provider, MD  DULoxetine (CYMBALTA) 30 MG capsule Take 1 capsule (30 mg total) by mouth daily. Patient not taking: Reported on 05/07/2015 04/09/15   York Spaniel, MD  gabapentin (NEURONTIN) 600 MG tablet Take 1 tablet (600 mg total) by mouth 3 (three) times daily. Patient not taking: Reported on 04/09/2015 03/06/15   York Spaniel, MD  isometheptene-acetaminophen-dichloralphenazone (MIDRIN) 612 148 2194 MG capsule Take 1 capsule by mouth 4 (four) times daily as needed for migraine. Patient not taking: Reported on 05/07/2015 04/02/15   York Spaniel, MD  oxyCODONE-acetaminophen (PERCOCET/ROXICET) 5-325 MG per tablet Take 1 tablet by mouth every 6 (six) hours as needed. Patient not taking: Reported on 05/07/2015 04/09/15   Bethann Berkshire, MD  tiZANidine (ZANAFLEX) 2 MG tablet One tablet twice a day for 1 week, then take 1 tablet 3 times a day Patient not taking: Reported on 05/07/2015 04/01/15   York Spaniel, MD   BP 98/57 mmHg  Pulse 72  Temp(Src) 98.6 F (37 C) (Oral)  Resp 18  Ht  (1.626 m)  Wt 189 lb (85.73 kg)  BMI 32.43 kg/m2  SpO2 97% Physical Exam  Constitutional: She is oriented to person, place, and time. She appears well-developed and well-nourished.  Non-toxic appearance.  HENT:  Head: Normocephalic.  Right Ear: Tympanic membrane and external ear normal.  Left Ear: Tympanic membrane and external ear normal.  Eyes: EOM and lids are normal. Pupils are equal, round, and reactive  to light.  Neck: Normal range of motion. Neck supple. Carotid bruit is not present.  Cardiovascular: Normal rate, regular rhythm, normal heart sounds, intact distal pulses and normal pulses.   Pulmonary/Chest: Breath sounds normal. No respiratory distress.  Abdominal: Soft. Bowel sounds are normal. There is no tenderness. There is no guarding.  Musculoskeletal: Normal range of motion.       Lumbar back: She exhibits tenderness and pain. She exhibits normal pulse.  Lymphadenopathy:       Head (right side): No submandibular adenopathy present.       Head (left side): No submandibular adenopathy present.    She has no cervical adenopathy.  Neurological: She is alert and oriented to person, place, and time. She has normal strength. No cranial nerve deficit or sensory deficit. She exhibits normal muscle tone. Coordination normal.  There is no facial asymmetry noted. There no sensory deficits appreciated of the face, or  the extremities.  The grip symmetrical. There no motor deficits of the lower extremities. There is no weakness of plantar or dorsiflexion.  The patient is able to sit on the side of the bed without loss of balance or any uncontrolled.  Skin: Skin is warm and dry.  Psychiatric: Her speech is normal. Her mood appears anxious.  Nursing note and vitals reviewed.   ED Course  11:39pm - Called back to patients room. Pt now has visitors. She is tearful. She states she has been waiting for a long time in the ED. She states her mother told her the Migraine cocktail does not work for her.(She told me during my initial interview that the migrain cocktail helps her a lot) She is now requesting a shot and allow her to go home. She states she is tired and hurting and wants to go home.  IV, and IV toradol, compazine, and benadryl d/c'd and IM dilaudid and compazine ordered.  Recheck after IM meds reveal pt in no distress. Symmetrical rise and fall of the chest. No hives or rash.  Procedures  (including critical care time) Labs Review Labs Reviewed  URINALYSIS, ROUTINE W REFLEX MICROSCOPIC (NOT [facility])    Imaging Review No results found.   EKG Interpretation None      MDM  Vital signs are well within normal limits. The pulse oximetry is 100% on room air. Within normal limits by my interpretation. Patient is a history of migraine headaches, and this one is similar to previous headaches, except it is not responding to her Fioricet or to hydrocodone at home. Patient is now having problems with vomiting as well. The patient denies the worst headache of her life, or thunderclap type headache. She reports problem with lower back pain over the past 2 weeks, that she feels like is contributing to the headache as well. There's been no high fevers reported. His been no injury to the head or to the back. His been no loss of control of the upper or lower extremities.  Patient states that in the past she has gotten relief from the "headache cocktail". We will try this approach.  After orders for the headache cocktail, the patient now has visitors in the room, and she states that she was told by her mother that this headache cocktail does not work for her. The patient has had several emergency department visits in May and in June for headaches at which time the headache cocktail was used. The patient is very anxious and tearful. Sitting up in the bed strongly requesting to not receive the IV, or have to wait for additional time. I am medications given the patient's request. I've asked the patient to follow with primary physician for recheck of these headaches and her back pain. The patient is invited to return to the emergency department immediately if any changes, problems, or concerns.    Final diagnoses:  None    *I have reviewed nursing notes, vital signs, and all appropriate lab and imaging results for this patient.Ivery Quale*     Shadi Sessler, PA-C 05/08/15 [id number]  Devoria AlbeIva Knapp, MD 05/15/15  2255

## 2015-05-08 ENCOUNTER — Telehealth: Payer: Self-pay | Admitting: Neurology

## 2015-05-08 LAB — URINALYSIS, ROUTINE W REFLEX MICROSCOPIC
BILIRUBIN URINE: NEGATIVE
Glucose, UA: NEGATIVE mg/dL
HGB URINE DIPSTICK: NEGATIVE
Ketones, ur: NEGATIVE mg/dL
Nitrite: NEGATIVE
Protein, ur: NEGATIVE mg/dL
Specific Gravity, Urine: 1.01 (ref 1.005–1.030)
Urobilinogen, UA: 0.2 mg/dL (ref 0.0–1.0)
pH: 7 (ref 5.0–8.0)

## 2015-05-08 LAB — URINE MICROSCOPIC-ADD ON

## 2015-05-08 MED ORDER — PREGABALIN 50 MG PO CAPS: ORAL_CAPSULE | ORAL | Status: DC

## 2015-05-08 NOTE — Telephone Encounter (Signed)
I called the patient and left a voicemail.

## 2015-05-08 NOTE — Telephone Encounter (Signed)
I called the patient. Based on the telephone notes, Lyrica was too expensive and Dr. Anne HahnWillis had switched the patient from Lyrica to Cymbalta, but the patient states she found an assistance program and is actually taking the Lyrica (50 mg twice daily). She states this has really helped her. Her PCP felt like increasing the dosage may help with the patient's continuous back pain but wanted to let Dr. Anne HahnWillis make that decision.

## 2015-05-08 NOTE — Telephone Encounter (Signed)
I called the patient. She is off of gabapentin and on Lyrica 50 mg twice a day. She is doing well with the pain, but could benefit from an increase. She is not on the Cymbalta. We will go up to 50 mg of the Lyrica in the morning and 100 mg in the evening.

## 2015-05-08 NOTE — Telephone Encounter (Signed)
Patient called stating she has continious back pain. Her PCP recommended she call Dr Anne HahnWillis to see if he would increase the Lyrica to help with the back pain. Her shoulder, legs and arms are doing a lot better. Please call and advise. Patient can be reached at [phone].

## 2015-05-09 ENCOUNTER — Encounter (HOSPITAL_COMMUNITY): Payer: Self-pay | Admitting: Emergency Medicine

## 2015-05-09 ENCOUNTER — Emergency Department (HOSPITAL_COMMUNITY)
Admission: EM | Admit: 2015-05-09 | Discharge: 2015-05-09 | Disposition: A | Discharge status: Home / Self Care | Payer: BLUE CROSS/BLUE SHIELD | Attending: Emergency Medicine | Admitting: Emergency Medicine

## 2015-05-09 DIAGNOSIS — F419 Anxiety disorder, unspecified: Secondary | ICD-10-CM | POA: Diagnosis not present

## 2015-05-09 DIAGNOSIS — Z720 Tobacco use: Secondary | ICD-10-CM | POA: Diagnosis not present

## 2015-05-09 DIAGNOSIS — Z8719 Personal history of other diseases of the digestive system: Secondary | ICD-10-CM | POA: Insufficient documentation

## 2015-05-09 DIAGNOSIS — Z79899 Other long term (current) drug therapy: Secondary | ICD-10-CM | POA: Insufficient documentation

## 2015-05-09 DIAGNOSIS — Z8742 Personal history of other diseases of the female genital tract: Secondary | ICD-10-CM | POA: Diagnosis not present

## 2015-05-09 DIAGNOSIS — G4700 Insomnia, unspecified: Secondary | ICD-10-CM | POA: Insufficient documentation

## 2015-05-09 DIAGNOSIS — G8929 Other chronic pain: Secondary | ICD-10-CM | POA: Diagnosis not present

## 2015-05-09 DIAGNOSIS — M545 Low back pain: Secondary | ICD-10-CM | POA: Diagnosis present

## 2015-05-09 DIAGNOSIS — E559 Vitamin D deficiency, unspecified: Secondary | ICD-10-CM | POA: Diagnosis not present

## 2015-05-09 DIAGNOSIS — F329 Major depressive disorder, single episode, unspecified: Secondary | ICD-10-CM | POA: Diagnosis not present

## 2015-05-09 DIAGNOSIS — G43909 Migraine, unspecified, not intractable, without status migrainosus: Secondary | ICD-10-CM | POA: Diagnosis not present

## 2015-05-09 DIAGNOSIS — M1990 Unspecified osteoarthritis, unspecified site: Secondary | ICD-10-CM | POA: Insufficient documentation

## 2015-05-09 MED ORDER — HYDROMORPHONE HCL 2 MG/ML IJ SOLN
2.0000 mg | Freq: Once | INTRAMUSCULAR | Status: AC
  Administered 2015-05-09: 2 mg via INTRAMUSCULAR
  Filled 2015-05-09: qty 1

## 2015-05-09 MED ORDER — PROCHLORPERAZINE EDISYLATE 5 MG/ML IJ SOLN
10.0000 mg | Freq: Once | INTRAMUSCULAR | Status: AC
  Administered 2015-05-09: 10 mg via INTRAMUSCULAR
  Filled 2015-05-09: qty 2

## 2015-05-09 NOTE — ED Provider Notes (Signed)
CSN: [id number]     Arrival date & time 05/09/15  1455 History   First MD Initiated Contact with Patient 05/09/15 1527     Chief Complaint  Patient presents with  . Back Pain     (Consider location/radiation/quality/duration/timing/severity/associated sxs/prior Treatment) Patient is a 34 y.o. female presenting with back pain. The history is provided by the patient. No language interpreter was used.  Back Pain Location:  Lumbar spine Quality:  Aching Radiates to:  L thigh Pain severity:  Moderate Pain is:  Same all the time Onset quality:  Unable to specify Timing:  Constant Progression:  Worsening Chronicity:  Recurrent Relieved by:  Nothing Worsened by:  Nothing tried Ineffective treatments:  None tried Associated symptoms: no abdominal pain and no fever     Past Medical History  Diagnosis Date  . Anxiety   . Migraines   . Fibrocystic disease of both breasts   . Arthritis     both knees  . Depression 12/11/2014  . Body aches 12/11/2014  . Common migraine 12/19/2014  . Fibromyalgia 12/19/2014  . IBS (irritable bowel syndrome)   . Vitamin D deficiency   . Chronic insomnia   . Rebound headache 12/19/2014  . Seizures    Past Surgical History  Procedure Laterality Date  . Cesarean section  [phone]  . Cholecystectomy  2001  . Tubal ligation    . Endometrial ablation     Family History  Problem Relation Age of Onset  . Hypertension Mother   . Other Mother     abnormal cells; had hyst  . Obesity Daughter   . Obesity Son   . Cancer Maternal Grandmother   . Osteoporosis Maternal Grandmother   . Cancer Maternal Grandfather   . Obesity Daughter   . Diabetes Father   . Hypertension Father   . Alcohol abuse Father    History  Substance Use Topics  . Smoking status: Current Every Day Smoker -- 1.00 packs/day for 0 years    Types: Cigarettes  . Smokeless tobacco: Never Used  . Alcohol Use: No   OB History    Gravida Para Term Preterm AB TAB SAB Ectopic  Multiple Living   Review of Systems  Constitutional: Negative for fever.  Gastrointestinal: Negative for abdominal pain.  Musculoskeletal: Positive for back pain.  All other systems reviewed and are negative.     Allergies  Chantix; Ciprofloxacin; Nortriptyline; Topamax; and Wellbutrin  Home Medications   Prior to Admission medications   Medication Sig Start Date End Date Taking? Authorizing Provider  ALPRAZolam Prudy Feeler) 0.5 MG tablet Take 0.5 mg by mouth 4 (four) times daily.  11/18/14  Yes Historical Provider, MD  butalbital-acetaminophen-caffeine (FIORICET, ESGIC) 50-325-40 MG per tablet Take 1 tablet by mouth 4 (four) times daily.   Yes Historical Provider, MD  cyanocobalamin (,VITAMIN B-12,) 1000 MCG/ML injection Inject 1,000 mcg into the muscle every 30 (thirty) days.  03/07/15  Yes Historical Provider, MD  HYDROcodone-acetaminophen (NORCO) 10-325 MG per tablet Take 1 tablet by mouth 4 (four) times daily.    Yes Historical Provider, MD  ibuprofen (ADVIL,MOTRIN) 200 MG tablet Take 600-800 mg by mouth every 6 (six) hours as needed for moderate pain.    Yes Historical Provider, MD  mirtazapine (REMERON) 15 MG tablet Take 30 mg by mouth at bedtime.    Yes Historical Provider, MD  naproxen sodium (ALEVE) 220 MG tablet Take 440 mg by  mouth daily as needed (for pain).   Yes Historical Provider, MD  pregabalin (LYRICA) 50 MG capsule One capsule in the morning and 2 capsules in the evening Patient taking differently: Take 50-100 mg by mouth 2 (two) times daily. One capsule in the morning and 2 capsules in the evening 05/08/15  Yes York Spanielharles K Willis, MD  Vitamin D, Ergocalciferol, (DRISDOL) 50000 UNITS CAPS capsule Inject 50,000 Units as directed every 30 (thirty) days. Last dose was on 03-17-15 11/20/14  Yes Historical Provider, MD  isometheptene-acetaminophen-dichloralphenazone (MIDRIN) 65-100-325 MG capsule Take 1 capsule by mouth 4 (four) times daily as needed for  migraine. Patient not taking: Reported on 05/07/2015 04/02/15   York Spanielharles K Willis, MD  tiZANidine (ZANAFLEX) 2 MG tablet One tablet twice a day for 1 week, then take 1 tablet 3 times a day Patient not taking: Reported on 05/07/2015 04/01/15   York Spanielharles K Willis, MD   BP 110/71 mmHg  Pulse 89  Temp(Src) 98.6 F (37 C) (Oral)  Resp 14  Ht 5\' 4"  (1.626 m)  Wt 189 lb (85.73 kg)  BMI 32.43 kg/m2  SpO2 100% Physical Exam  Constitutional: She is oriented to person, place, and time. She appears well-developed and well-nourished.  HENT:  Head: Normocephalic and atraumatic.  Right Ear: External ear normal.  Eyes: EOM are normal. Pupils are equal, round, and reactive to light.  Neck: Normal range of motion.  Cardiovascular: Normal rate and normal heart sounds.   Pulmonary/Chest: Effort normal.  Abdominal: She exhibits no distension.  Musculoskeletal: She exhibits tenderness.  Neurological: She is alert and oriented to person, place, and time.  Skin: Skin is warm.  Psychiatric: She has a normal mood and affect.  Nursing note and vitals reviewed.   ED Course  Procedures (including critical care time) Labs Review Labs Reviewed - No data to display  Imaging Review No results found.   EKG Interpretation None      MDM  Pt given injection for pain.  I reviewed her notes.  She spoke to Dr. Anne HahnWillis yesterday who advised her to increase her lyrica.   I advised pt to continue as directed   Final diagnoses:  Chronic pain        Elson AreasLeslie K Cassidee Deats, PA-C 05/09/15 1615  Margarita Grizzleanielle Ray, MD 05/10/15 2134

## 2015-05-09 NOTE — Discharge Instructions (Signed)

## 2015-05-09 NOTE — ED Notes (Signed)
Patient complaining of back pain radiating down left leg. States she was treated here recently for same.

## 2015-05-12 ENCOUNTER — Telehealth: Payer: Self-pay | Admitting: Neurology

## 2015-05-12 NOTE — Telephone Encounter (Signed)
We do not have any Medicaid info on file.  I called the pharmacy.  Spoke with Baird Lyonsasey, who transferred me to the pharmacist.  He said the patient does not have Medicaid, and she picked up the Lyrica Rx on 07/08.  Says it went through her primary ins, and they billed a co-pay card secondary to reduce the cost.  They show no issues with this Rx.  I called the patient back to clarify.  Got no answer.  Left message.

## 2015-05-12 NOTE — Telephone Encounter (Signed)
Patient call stating her secondary ins Medicaid needs prior approval for pregabalin (LYRICA) 50 MG capsule . Please call and advise. She can be reached at [phone].

## 2015-05-13 ENCOUNTER — Emergency Department (HOSPITAL_COMMUNITY)
Admission: EM | Admit: 2015-05-13 | Discharge: 2015-05-13 | Disposition: A | Discharge status: Home / Self Care | Payer: BLUE CROSS/BLUE SHIELD

## 2015-05-13 ENCOUNTER — Telehealth: Payer: Self-pay | Admitting: Neurology

## 2015-05-13 NOTE — Telephone Encounter (Signed)
Pt called back wanting to know if there was anything that could help her. She stated that she couldn't wait much longer because the pain was to great. She was advised that if she felt she needed to be seen urgently that she should go the ER. She agreed and stated that that is what she was going to do. She still would like a call back so she can talk with someone. Please call and advise [phone]

## 2015-05-13 NOTE — Telephone Encounter (Signed)
We will need Medicaid info to process request.  I called back.  Got no answer.  Left message.    If patient calls back, please obtain Medicaid ID and phone number.  Thank you.

## 2015-05-13 NOTE — Telephone Encounter (Signed)
I called the patient, left a message. The patient apparently went to the emergency room. She recently was increased on the Lyrica within the last 3-4 days taking 150 mg total daily. This dose can be gradually increased as long she is tolerating this. If she requires any further assistance, she is to contact our office again.

## 2015-05-13 NOTE — Telephone Encounter (Signed)
I called the patient. She stated the increase in Lyrica helped until she went back to work last Friday. 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 She states that she has to stand most of the day and hold equipment up for 5-15 minutes at a time. Her legs were shaking and weak before the end of the day at work. She went to the ED that evening and they told her to continue her current therapy. She does not have an appointment scheduled with the pain clinic yet. She is waiting on the referral to be sent. She would like something else to help with her pain until then. She states she has no more vacation/sick days left and has to work as she is a single mom.

## 2015-05-13 NOTE — Telephone Encounter (Signed)
Patient returned call, she wanted to explain. The [facility] and she did pick the medication up. She has been granted medicaid and they are the ones that need the prior approval. If she gets the prior approval her pharmacy has agreed to redo the medication and back date it with the prior approval and medicaid will cover the remaining cost. She is at work and may not be able to answer if you call but you can leave a message.

## 2015-05-13 NOTE — Telephone Encounter (Signed)
Patient called stating she returned to work on Friday, she went ED on 05/09/15 after work. Today she states the pain is all over, she is dizzy, legs feel like they will give out and shaking,and she is in severe pain. She has fell twice since Friday. PCP is referring to her pain clinic. She is requesting something to help until then. Please call and advise. Patient can be reached at [phone] or [phone]).

## 2015-05-14 MED ORDER — IBUPROFEN 800 MG PO TABS: 800.0000 mg | ORAL_TABLET | Freq: Three times a day (TID) | ORAL | Status: DC | PRN

## 2015-05-14 MED ORDER — PREGABALIN 100 MG PO CAPS: 100.0000 mg | ORAL_CAPSULE | Freq: Two times a day (BID) | ORAL | Status: DC

## 2015-05-14 NOTE — Telephone Encounter (Signed)
Patient called back and stated that she would like to increase the medication. Please call and advise.

## 2015-05-14 NOTE — Addendum Note (Signed)
Addended by: Stephanie AcreWILLIS, Militza Devery on: 05/14/2015 09:18 AM   Modules accepted: Orders, Medications

## 2015-05-14 NOTE — Telephone Encounter (Signed)
I called patient. The patient is still having ongoing discomfort. She is getting some headaches as well, unfortunately she is taking Fioricet 2-4 tablets daily, and she drinks at least 212 ounce [employer] soft drinks a day, and she drinks iced tea. I've indicated that she needs to get off of the caffeine, I warned her about the rebound headache effects that can occur with Fioricet, and I would recommend stopping this medication. I will call in ibuprofen 800 mg to take if needed for her headache. The Lyrica will be increased taking 100 mg twice daily. The patient also complains of low back pain, she indicates that she has a pinched nerve in her back. She will be seen in the next several weeks. The patient is at high risk for rebound type headaches.

## 2015-05-14 NOTE — Telephone Encounter (Signed)
Patient called back and requested to send a message to Dr. Anne HahnWillis regarding her pain. She states that she spoke with her PCP and her PCP suggested for her to go to a pain clinic but it will take a month for her to get in. She would like to know if she can get some pain medication for her fibromyalgia. She states that she is in extreme pain and nothing is helping. Please call and advise.

## 2015-05-14 NOTE — Telephone Encounter (Signed)
I called the patient. I recommended that she try the changes in medication that Dr. Anne HahnWillis advised/prescribed this morning. She seemed reluctant. I advised her to give the increased dose of Lyrica a chance to work and if it does not to call us back. She agreed to the plan.

## 2015-05-29 ENCOUNTER — Telehealth: Payer: Self-pay

## 2015-05-29 ENCOUNTER — Encounter: Payer: Self-pay | Admitting: Neurology

## 2015-05-29 ENCOUNTER — Emergency Department (HOSPITAL_COMMUNITY)
Admission: EM | Admit: 2015-05-29 | Discharge: 2015-05-29 | Disposition: A | Discharge status: Home / Self Care | Payer: BLUE CROSS/BLUE SHIELD | Attending: Emergency Medicine | Admitting: Emergency Medicine

## 2015-05-29 ENCOUNTER — Encounter (HOSPITAL_COMMUNITY): Payer: Self-pay | Admitting: Emergency Medicine

## 2015-05-29 ENCOUNTER — Emergency Department (HOSPITAL_COMMUNITY): Payer: BLUE CROSS/BLUE SHIELD

## 2015-05-29 ENCOUNTER — Ambulatory Visit (INDEPENDENT_AMBULATORY_CARE_PROVIDER_SITE_OTHER): Payer: BLUE CROSS/BLUE SHIELD | Admitting: Neurology

## 2015-05-29 VITALS — BP 105/73 | HR 85 | Ht 64.0 in | Wt 186.8 lb

## 2015-05-29 DIAGNOSIS — M797 Fibromyalgia: Secondary | ICD-10-CM | POA: Insufficient documentation

## 2015-05-29 DIAGNOSIS — M546 Pain in thoracic spine: Secondary | ICD-10-CM | POA: Diagnosis not present

## 2015-05-29 DIAGNOSIS — G4700 Insomnia, unspecified: Secondary | ICD-10-CM | POA: Insufficient documentation

## 2015-05-29 DIAGNOSIS — G43009 Migraine without aura, not intractable, without status migrainosus: Secondary | ICD-10-CM | POA: Diagnosis not present

## 2015-05-29 DIAGNOSIS — Z720 Tobacco use: Secondary | ICD-10-CM | POA: Diagnosis not present

## 2015-05-29 DIAGNOSIS — M545 Low back pain, unspecified: Secondary | ICD-10-CM | POA: Insufficient documentation

## 2015-05-29 DIAGNOSIS — Z8742 Personal history of other diseases of the female genital tract: Secondary | ICD-10-CM | POA: Insufficient documentation

## 2015-05-29 DIAGNOSIS — E559 Vitamin D deficiency, unspecified: Secondary | ICD-10-CM | POA: Diagnosis not present

## 2015-05-29 DIAGNOSIS — F419 Anxiety disorder, unspecified: Secondary | ICD-10-CM | POA: Insufficient documentation

## 2015-05-29 DIAGNOSIS — G8929 Other chronic pain: Secondary | ICD-10-CM | POA: Insufficient documentation

## 2015-05-29 DIAGNOSIS — G40909 Epilepsy, unspecified, not intractable, without status epilepticus: Secondary | ICD-10-CM | POA: Diagnosis not present

## 2015-05-29 DIAGNOSIS — M549 Dorsalgia, unspecified: Secondary | ICD-10-CM

## 2015-05-29 DIAGNOSIS — F329 Major depressive disorder, single episode, unspecified: Secondary | ICD-10-CM | POA: Diagnosis not present

## 2015-05-29 DIAGNOSIS — M170 Bilateral primary osteoarthritis of knee: Secondary | ICD-10-CM | POA: Insufficient documentation

## 2015-05-29 DIAGNOSIS — G43019 Migraine without aura, intractable, without status migrainosus: Secondary | ICD-10-CM

## 2015-05-29 DIAGNOSIS — Z8719 Personal history of other diseases of the digestive system: Secondary | ICD-10-CM | POA: Diagnosis not present

## 2015-05-29 DIAGNOSIS — Z79899 Other long term (current) drug therapy: Secondary | ICD-10-CM | POA: Diagnosis not present

## 2015-05-29 HISTORY — DX: Low back pain, unspecified: M54.50

## 2015-05-29 MED ORDER — PREGABALIN 100 MG PO CAPS: 100.0000 mg | ORAL_CAPSULE | Freq: Three times a day (TID) | ORAL | Status: DC

## 2015-05-29 MED ORDER — NAPROXEN 500 MG PO TABS: 500.0000 mg | ORAL_TABLET | Freq: Two times a day (BID) | ORAL | Status: DC

## 2015-05-29 MED ORDER — METHOCARBAMOL 500 MG PO TABS: 500.0000 mg | ORAL_TABLET | Freq: Two times a day (BID) | ORAL | Status: DC | PRN

## 2015-05-29 NOTE — Progress Notes (Signed)
Reason for visit: Low back pain  Referring physician: Dr. Joyice Parker is a 34 y.o. female  History of present illness:  Mary Parker is a 34 year old right-handed white female with a history of migraine headaches. The patient has been treated through this office for the migraines previously. The patient actually has done quite well with the migraines, she indicates that she has not had a headache in over one month. The patient has had nonorganic features to her clinical examinations previously with a left hemisensory deficit also associated with decreased vibratory sensation and splitting midline with vibration on the forehead. The patient has had MRI evaluations of the brain that were unremarkable. She has had onset of low back pain 3 months ago. The patient believes that the pain came on around the time of the fall. The patient has pain in the left low back, buttocks area, and down the back of the leg to the knee, no pain is below the knee. The patient continues to have numbness on the entire left side the body. She believes that there is some weakness of the left leg. When she sits, she has to lean towards the right to get the pressure off of her left buttocks, which improves the pain. She will limp on the left leg, it hurts to bear weight on the leg. She denies any problems controlling the bowels or the bladder. She has some slight imbalance problems, with no further falls. She has been seeing a chiropractor which offers transient benefit. She is sent to this office for an evaluation. She does take Lyrica, she is felt to have fibromyalgia, this does seem to help. Currently, she is on 100 mg twice daily. She continues to work.  Past Medical History  Diagnosis Date  . Anxiety   . Migraines   . Fibrocystic disease of both breasts   . Arthritis     both knees  . Depression 12/11/2014  . Body aches 12/11/2014  . Common migraine 12/19/2014  . Fibromyalgia 12/19/2014  . IBS (irritable  bowel syndrome)   . Vitamin D deficiency   . Chronic insomnia   . Rebound headache 12/19/2014  . Seizures   . Chronic low back pain 05/29/2015    Past Surgical History  Procedure Laterality Date  . Cesarean section  [phone]  . Cholecystectomy  2001  . Tubal ligation    . Endometrial ablation      Family History  Problem Relation Age of Onset  . Hypertension Mother   . Other Mother     abnormal cells; had hyst  . Obesity Daughter   . Obesity Son   . Cancer Maternal Grandmother   . Osteoporosis Maternal Grandmother   . Cancer Maternal Grandfather   . Obesity Daughter   . Diabetes Father   . Hypertension Father   . Alcohol abuse Father     Social history:  reports that she has been smoking Cigarettes.  She has been smoking about 1.00 pack per day for the past 0 years. She has never used smokeless tobacco. She reports that she does not drink alcohol or use illicit drugs.  Medications:  Prior to Admission medications   Medication Sig Start Date End Date Taking? Authorizing Provider  ALPRAZolam Prudy Feeler) 0.5 MG tablet Take 0.5 mg by mouth 4 (four) times daily.  11/18/14  Yes Historical Provider, MD  butalbital-acetaminophen-caffeine (FIORICET, ESGIC) 50-325-40 MG per tablet Take 1 tablet by mouth 4 (four) times daily.   Yes  Historical Provider, MD  cyanocobalamin (,VITAMIN B-12,) 1000 MCG/ML injection Inject 1,000 mcg into the muscle every 30 (thirty) days.  03/07/15  Yes Historical Provider, MD  HYDROcodone-acetaminophen (NORCO) 10-325 MG per tablet Take 1 tablet by mouth 4 (four) times daily.    Yes Historical Provider, MD  mirtazapine (REMERON) 15 MG tablet Take 30 mg by mouth at bedtime.    Yes Historical Provider, MD  pregabalin (LYRICA) 100 MG capsule Take 1 capsule (100 mg total) by mouth 2 (two) times daily. 05/14/15  Yes Mary Spaniel, MD  tiZANidine (ZANAFLEX) 2 MG tablet One tablet twice a day for 1 week, then take 1 tablet 3 times a day 04/01/15  Yes Mary Spaniel, MD  Vitamin D, Ergocalciferol, (DRISDOL) 50000 UNITS CAPS capsule Take 50,000 Units by mouth every 30 (thirty) days. Last dose was on 03-17-15 11/20/14  Yes Historical Provider, MD  ibuprofen (ADVIL,MOTRIN) 800 MG tablet Take 1 tablet (800 mg total) by mouth every 8 (eight) hours as needed. Patient not taking: Reported on 05/29/2015 05/14/15   Mary Spaniel, MD  naproxen sodium (ALEVE) 220 MG tablet Take 440 mg by mouth daily as needed (for pain).    Historical Provider, MD      Allergies  Allergen Reactions  . Chantix [Varenicline] Other (See Comments)    Seizure   . Ciprofloxacin Hives    Sweating, dizziness  . Nortriptyline     GERD  . Topamax [Topiramate] Swelling    Dizziness and slurred speech  . Wellbutrin [Bupropion] Other (See Comments)    Suicidal ideation    ROS:  Out of a complete 14 system review of symptoms, the patient complains only of the following symptoms, and all other reviewed systems are negative.  Weight gain, fatigue Increased thirst Joint pain, achy muscles Urination problems Headache, numbness, weakness Depression, anxiety Snoring  Blood pressure 105/73, pulse 85, height  (1.626 m), weight 186 lb 12.8 oz (84.732 kg).  Physical Exam  General: The patient is alert and cooperative at the time of the examination. The patient is moderately obese.  Eyes: Pupils are equal, round, and reactive to light. Discs are flat bilaterally.  Neck: The neck is supple, no carotid bruits are noted.  Respiratory: The respiratory examination is clear.  Cardiovascular: The cardiovascular examination reveals a regular rate and rhythm, no obvious murmurs or rubs are noted.  Neuromuscular: The patient has good range of movement of the low back. The patient has some tenderness lateral to the midline of the low back to the left. The patient does report some discomfort with rotation of the hip, but the pain is not severe.  Skin: Extremities are without  significant edema.  Neurologic Exam  Mental status: The patient is alert and oriented x 3 at the time of the examination. The patient has apparent normal recent and remote memory, with an apparently normal attention span and concentration ability.  Cranial nerves: Facial symmetry is present. There is good sensation of the face to pinprick and soft touch on the right, decreased on the left. The patient splits the midline with vibration sensation on the forehead, decreased on the left. The strength of the facial muscles and the muscles to head turning and shoulder shrug are normal bilaterally. Speech is well enunciated, no aphasia or dysarthria is noted. Extraocular movements are full. Visual fields are full. The tongue is midline, and the patient has symmetric elevation of the soft palate. No obvious hearing deficits are noted.  Motor:  The motor testing reveals 5 over 5 strength of all 4 extremities. Good symmetric motor tone is noted throughout.  Sensory: Sensory testing is intact to pinprick, soft touch, vibration sensation, and position sense on the right extremities. The patient reports decreased pinprick sensation on the left arm and leg, decreased vibration sensation. Position sense is normal. No evidence of extinction is noted.  Coordination: Cerebellar testing reveals good finger-nose-finger and heel-to-shin bilaterally.  Gait and station: Gait is associated with a limping quality on the left leg. The patient is able to walk on heels and the toes.. Tandem gait is slightly unsteady. Romberg is negative. No drift is seen.  Reflexes: Deep tendon reflexes are symmetric and normal bilaterally. Toes are downgoing bilaterally.   MRI lumbar 04/07/15:  IMPRESSION: Mild lumbar degenerative change. Negative for spinal stenosis or neural impingement.  * MRI scan images were reviewed online. I agree with the written report.    Assessment/Plan:  1. Common migraine  2. Fibromyalgia  3. Low  back pain, left leg pain  4. Nonorganic clinical examination  The patient continues to have a nonorganic sensory examination. The patient has some give-away type weakness on the left arm. The patient has no true weakness. She reports left-sided back pain with radiation down the leg to the knee. MRI evaluation of the low back has been done previously that is relatively unremarkable. The patient is seeing a chiropractor, she will continue doing this. We will set her up for nerve conduction studies of both legs, EMG on the left leg. The Lyrica dosing will be increased to 100 mg 3 times daily. She will follow-up for the EMG. She indicates that she went to the [facility], she indicates that she does not wish to return.  Marlan Palau MD 05/29/2015 7:25 PM  [facility] [address] [place], [place] [address]  Phone [phone] Fax [phone]

## 2015-05-29 NOTE — Telephone Encounter (Signed)
I called Medicaid at [phone].  Spoke with Toniann Fail.  Provided clinical info, and was able to get an approval on Lyrica effective until 05/23/2016 Ref # [id number] Ref call # [id number].  I called the patient to advise.  Got no answer.  Left message.

## 2015-05-29 NOTE — ED Notes (Signed)
Sent by Dr. Sherwood Gambler for eval for lower back pain with numbness to lower extremities.

## 2015-05-29 NOTE — ED Notes (Signed)
Pt declined wheelchair/assistance out

## 2015-05-29 NOTE — ED Provider Notes (Signed)
CSN: [id number]     Arrival date & time 05/29/15  1249 History   First MD Initiated Contact with Patient 05/29/15 1314     Chief Complaint  Patient presents with  . Back Pain     (Consider location/radiation/quality/duration/timing/severity/associated sxs/prior Treatment) HPI Comments:  The patient is a 34 year old female, she has been seen at the hospital multiple times for pain complaints over the last couple of months.   I saw the patient approximately 6 weeks ago during which visit the patient had an MRI of her lumbar spine. The MRI showed that the patient had no acute findings including no radiculopathy, no abnormalities of the lumbar spine, no herniations, no spinal stenosis, no bleeding, no fractures, no infections. She has continued to have back pain, she has been seen by her family doctor and has also been seen by her neurologist both of whom she saw today. Her neurologist agrees that the patient has a nonorganic exam and will perform EMG studies to further evaluate her legs - she has had chiropractic manipulation and believes there is something wrong with her back - she has no urinary difficulty, no fevers, no hx of CA.  Sx are intermittent and seem to come in flares.  No fevers, chills, headache, sore throat, visual changes, neck pain, chest pain, abdominal pain, shortness of breath, cough, dysuria, diarrhea, rectal bleeding, swelling, rashes or weakness.   Patient is a 34 y.o. female presenting with back pain. The history is provided by the patient.  Back Pain Associated symptoms: numbness     Past Medical History  Diagnosis Date  . Anxiety   . Migraines   . Fibrocystic disease of both breasts   . Arthritis     both knees  . Depression 12/11/2014  . Body aches 12/11/2014  . Common migraine 12/19/2014  . Fibromyalgia 12/19/2014  . IBS (irritable bowel syndrome)   . Vitamin D deficiency   . Chronic insomnia   . Rebound headache 12/19/2014  . Seizures   . Chronic low back pain  05/29/2015   Past Surgical History  Procedure Laterality Date  . Cesarean section  [phone]  . Cholecystectomy  2001  . Tubal ligation    . Endometrial ablation     Family History  Problem Relation Age of Onset  . Hypertension Mother   . Other Mother     abnormal cells; had hyst  . Obesity Daughter   . Obesity Son   . Cancer Maternal Grandmother   . Osteoporosis Maternal Grandmother   . Cancer Maternal Grandfather   . Obesity Daughter   . Diabetes Father   . Hypertension Father   . Alcohol abuse Father    History  Substance Use Topics  . Smoking status: Current Every Day Smoker -- 1.00 packs/day for 0 years    Types: Cigarettes  . Smokeless tobacco: Never Used  . Alcohol Use: No   OB History    Gravida Para Term Preterm AB TAB SAB Ectopic Multiple Living   3 3        3      Review of Systems  Musculoskeletal: Positive for back pain.  Neurological: Positive for numbness.  All other systems reviewed and are negative.     Allergies  Chantix; Ciprofloxacin; Nortriptyline; Topamax; and Wellbutrin  Home Medications   Prior to Admission medications   Medication Sig Start Date End Date Taking? Authorizing Provider  ALPRAZolam Prudy Feeler) 0.5 MG tablet Take 0.5 mg by mouth 4 (four) times daily.  11/18/14  Yes Historical Provider, MD  butalbital-acetaminophen-caffeine (FIORICET, ESGIC) 50-325-40 MG per tablet Take 1 tablet by mouth every 4 (four) hours as needed for migraine.    Yes Historical Provider, MD  cyanocobalamin (,VITAMIN B-12,) 1000 MCG/ML injection Inject 1,000 mcg into the muscle every 30 (thirty) days.  03/07/15  Yes Historical Provider, MD  HYDROcodone-acetaminophen (NORCO) 10-325 MG per tablet Take 1 tablet by mouth 4 (four) times daily.    Yes Historical Provider, MD  mirtazapine (REMERON) 30 MG tablet Take 30 mg by mouth at bedtime.   Yes Historical Provider, MD  pregabalin (LYRICA) 100 MG capsule Take 1 capsule (100 mg total) by mouth 3 (three) times  daily. 05/29/15  Yes York Spaniel, MD  Vitamin D, Ergocalciferol, (DRISDOL) 50000 UNITS CAPS capsule Take 50,000 Units by mouth every 30 (thirty) days. Last dose was on 03-17-15 11/20/14  Yes Historical Provider, MD  methocarbamol (ROBAXIN) 500 MG tablet Take 1 tablet (500 mg total) by mouth 2 (two) times daily as needed for muscle spasms. 05/29/15   Eber Hong, MD  naproxen (NAPROSYN) 500 MG tablet Take 1 tablet (500 mg total) by mouth 2 (two) times daily with a meal. 05/29/15   Eber Hong, MD   BP 116/74 mmHg  Pulse 82  Temp(Src) 98.2 F (36.8 C) (Oral)  Resp 18  Ht  (1.626 m)  Wt 184 lb (83.462 kg)  BMI 31.57 kg/m2  SpO2 98% Physical Exam  Constitutional: She appears well-developed and well-nourished.  HENT:  Head: Normocephalic and atraumatic.  Eyes: Conjunctivae are normal. Right eye exhibits no discharge. Left eye exhibits no discharge.  Pulmonary/Chest: Effort normal. No respiratory distress.  Musculoskeletal: She exhibits tenderness ( ttp in the lower lumbar paraspinal muscles and over the SI joint - has some ttp in the thoracic area as well.).  Neurological: She is alert. Coordination normal.  Gait antalgic 2/2 pain - has normal speech, normal coordination and normal strength in all 4 extremities.  Sensation is patchy to the LLE.  Skin: Skin is warm and dry. No rash noted. She is not diaphoretic. No erythema.  Psychiatric: She has a normal mood and affect.  Nursing note and vitals reviewed.   ED Course  Procedures (including critical care time) Labs Review Labs Reviewed - No data to display  Imaging Review Mr Thoracic Spine Wo Contrast  05/29/2015   CLINICAL DATA:  Back pain  EXAM: MRI THORACIC SPINE WITHOUT CONTRAST  TECHNIQUE: Multiplanar, multisequence MR imaging of the thoracic spine was performed. No intravenous contrast was administered.  COMPARISON:  None.  FINDINGS: Normal and thoracic alignment. Negative for fracture. 5 mm cystic lesion T9 may represent  hemangioma or other benign lesion. No other bone marrow lesions identified.  Thoracic cord is normal.  No cord compression or cord lesion.  Negative for spinal stenosis. Negative for disc protrusion. No significant disc space narrowing.  IMPRESSION: Negative   Electronically Signed   By: Marlan Palau M.D.   On: 05/29/2015 15:37      MDM   Final diagnoses:  Back pain     I called Dr. Sherwood Gambler and discussed care - I have reviewed the neurology notes - doubt source will be found - but will check T spine MRI and tx pain with nsaids and muscle relaxants as outpt though she already received demerol IM prior to arrival which she states helped with the pain.  T spine MRI normal - pt stable for d/c.  Meds given in ED:  Medications -  No data to display  Discharge Medication List as of 05/29/2015  4:25 PM    START taking these medications   Details  methocarbamol (ROBAXIN) 500 MG tablet Take 1 tablet (500 mg total) by mouth 2 (two) times daily as needed for muscle spasms., Starting 05/29/2015, Until Discontinued, Print    naproxen (NAPROSYN) 500 MG tablet Take 1 tablet (500 mg total) by mouth 2 (two) times daily with a meal., Starting 05/29/2015, Until Discontinued, Print          Eber Hong, MD 05/29/15 1655

## 2015-05-29 NOTE — Discharge Instructions (Signed)

## 2015-05-29 NOTE — Patient Instructions (Addendum)
We will go up on the Lyrica taking 100 mg three times a day. I will set you up for an EMG/NCV study to look for a pinched nerve.   Back Pain, Adult Low back pain is very common. About 1 in 5 people have back pain.The cause of low back pain is rarely dangerous. The pain often gets better over time.About half of people with a sudden onset of back pain feel better in just 2 weeks. About 8 in 10 people feel better by 6 weeks.  CAUSES Some common causes of back pain include:  Strain of the muscles or ligaments supporting the spine.  Wear and tear (degeneration) of the spinal discs.  Arthritis.  Direct injury to the back. DIAGNOSIS Most of the time, the direct cause of low back pain is not known.However, back pain can be treated effectively even when the exact cause of the pain is unknown.Answering your caregiver's questions about your overall health and symptoms is one of the most accurate ways to make sure the cause of your pain is not dangerous. If your caregiver needs more information, he or she may order lab work or imaging tests (X-rays or MRIs).However, even if imaging tests show changes in your back, this usually does not require surgery. HOME CARE INSTRUCTIONS For many people, back pain returns.Since low back pain is rarely dangerous, it is often a condition that people can learn to [facility] their own.   Remain active. It is stressful on the back to sit or stand in one place. Do not sit, drive, or stand in one place for more than 30 minutes at a time. Take short walks on level surfaces as soon as pain allows.Try to increase the length of time you walk each day.  Do not stay in bed.Resting more than 1 or 2 days can delay your recovery.  Do not avoid exercise or work.Your body is made to move.It is not dangerous to be active, even though your back may hurt.Your back will likely heal faster if you return to being active before your pain is gone.  Pay attention to your body  when you bend and lift. Many people have less discomfortwhen lifting if they bend their knees, keep the load close to their bodies,and avoid twisting. Often, the most comfortable positions are those that put less stress on your recovering back.  Find a comfortable position to sleep. Use a firm mattress and lie on your side with your knees slightly bent. If you lie on your back, put a pillow under your knees.  Only take over-the-counter or prescription medicines as directed by your caregiver. Over-the-counter medicines to reduce pain and inflammation are often the most helpful.Your caregiver may prescribe muscle relaxant drugs.These medicines help dull your pain so you can more quickly return to your normal activities and healthy exercise.  Put ice on the injured area.  Put ice in a plastic bag.  Place a towel between your skin and the bag.  Leave the ice on for 15-20 minutes, 03-04 times a day for the first 2 to 3 days. After that, ice and heat may be alternated to reduce pain and spasms.  Ask your caregiver about trying back exercises and gentle massage. This may be of some benefit.  Avoid feeling anxious or stressed.Stress increases muscle tension and can worsen back pain.It is important to recognize when you are anxious or stressed and learn ways to manage it.Exercise is a great option. SEEK MEDICAL CARE IF:  You have pain  that is not relieved with rest or medicine.  You have pain that does not improve in 1 week.  You have new symptoms.  You are generally not feeling well. SEEK IMMEDIATE MEDICAL CARE IF:   You have pain that radiates from your back into your legs.  You develop new bowel or bladder control problems.  You have unusual weakness or numbness in your arms or legs.  You develop nausea or vomiting.  You develop abdominal pain.  You feel faint. Document Released: 10/18/2005 Document Revised: 04/18/2012 Document Reviewed: 02/19/2014 [facility] Patient  Information 2015 [place], [place]. This information is not intended to replace advice given to you by your health care provider. Make sure you discuss any questions you have with your health care provider.

## 2015-05-29 NOTE — ED Notes (Signed)
Having back pain for last three months.  Was seen by Dr Thurmon Fair at [facility] this am and given demerol IM and Zofran in the office.  Pt is here for evaluation per Dr Thurmon Fair.

## 2015-05-29 NOTE — Telephone Encounter (Signed)
Patient brought in a copy of her ins card today.  ID # is [id number]

## 2015-05-30 ENCOUNTER — Encounter (HOSPITAL_COMMUNITY): Payer: Self-pay | Admitting: Emergency Medicine

## 2015-05-30 ENCOUNTER — Emergency Department (HOSPITAL_COMMUNITY): Payer: BLUE CROSS/BLUE SHIELD

## 2015-05-30 ENCOUNTER — Telehealth: Payer: Self-pay | Admitting: Neurology

## 2015-05-30 ENCOUNTER — Emergency Department (HOSPITAL_COMMUNITY)
Admission: EM | Admit: 2015-05-30 | Discharge: 2015-05-30 | Disposition: A | Discharge status: Home / Self Care | Payer: BLUE CROSS/BLUE SHIELD | Attending: Emergency Medicine | Admitting: Emergency Medicine

## 2015-05-30 DIAGNOSIS — G43909 Migraine, unspecified, not intractable, without status migrainosus: Secondary | ICD-10-CM | POA: Insufficient documentation

## 2015-05-30 DIAGNOSIS — Z79899 Other long term (current) drug therapy: Secondary | ICD-10-CM | POA: Diagnosis not present

## 2015-05-30 DIAGNOSIS — G4700 Insomnia, unspecified: Secondary | ICD-10-CM | POA: Diagnosis not present

## 2015-05-30 DIAGNOSIS — G8929 Other chronic pain: Secondary | ICD-10-CM | POA: Insufficient documentation

## 2015-05-30 DIAGNOSIS — M797 Fibromyalgia: Secondary | ICD-10-CM | POA: Insufficient documentation

## 2015-05-30 DIAGNOSIS — F329 Major depressive disorder, single episode, unspecified: Secondary | ICD-10-CM | POA: Insufficient documentation

## 2015-05-30 DIAGNOSIS — G40909 Epilepsy, unspecified, not intractable, without status epilepticus: Secondary | ICD-10-CM | POA: Diagnosis not present

## 2015-05-30 DIAGNOSIS — M1990 Unspecified osteoarthritis, unspecified site: Secondary | ICD-10-CM | POA: Diagnosis not present

## 2015-05-30 DIAGNOSIS — M6281 Muscle weakness (generalized): Secondary | ICD-10-CM | POA: Diagnosis present

## 2015-05-30 DIAGNOSIS — R29898 Other symptoms and signs involving the musculoskeletal system: Secondary | ICD-10-CM

## 2015-05-30 DIAGNOSIS — Z720 Tobacco use: Secondary | ICD-10-CM | POA: Insufficient documentation

## 2015-05-30 DIAGNOSIS — E559 Vitamin D deficiency, unspecified: Secondary | ICD-10-CM | POA: Insufficient documentation

## 2015-05-30 DIAGNOSIS — F419 Anxiety disorder, unspecified: Secondary | ICD-10-CM | POA: Insufficient documentation

## 2015-05-30 HISTORY — DX: Sciatica, left side: M54.32

## 2015-05-30 LAB — COMPREHENSIVE METABOLIC PANEL
ALBUMIN: 3.9 g/dL (ref 3.5–5.0)
ALK PHOS: 62 U/L (ref 38–126)
ALT: 11 U/L — ABNORMAL LOW (ref 14–54)
ANION GAP: 8 (ref 5–15)
AST: 13 U/L — AB (ref 15–41)
BILIRUBIN TOTAL: 0.4 mg/dL (ref 0.3–1.2)
BUN: 11 mg/dL (ref 6–20)
CALCIUM: 9 mg/dL (ref 8.9–10.3)
CHLORIDE: 103 mmol/L (ref 101–111)
CO2: 27 mmol/L (ref 22–32)
Creatinine, Ser: 0.79 mg/dL (ref 0.44–1.00)
GFR calc Af Amer: >60 mL/min (ref >60)
Glucose, Bld: 84 mg/dL (ref 65–99)
Potassium: 3.7 mmol/L (ref 3.5–5.1)
SODIUM: 138 mmol/L (ref 135–145)
Total Protein: 7.2 g/dL (ref 6.5–8.1)

## 2015-05-30 LAB — CBC WITH DIFFERENTIAL/PLATELET
BASOS PCT: 0 % (ref 0–1)
Basophils Absolute: 0 10*3/uL (ref 0.0–0.1)
EOS ABS: 0.1 10*3/uL (ref 0.0–0.7)
Eosinophils Relative: 1 % (ref 0–5)
HCT: 40.1 % (ref 36.0–46.0)
Hemoglobin: 13.1 g/dL (ref 12.0–15.0)
Lymphocytes Relative: 18 % (ref 12–46)
Lymphs Abs: 1.6 10*3/uL (ref 0.7–4.0)
MCH: 31.8 pg (ref 26.0–34.0)
MCHC: 32.7 g/dL (ref 30.0–36.0)
MCV: 97.3 fL (ref 78.0–100.0)
MONO ABS: 0.5 10*3/uL (ref 0.1–1.0)
Monocytes Relative: 6 % (ref 3–12)
NEUTROS ABS: 6.7 10*3/uL (ref 1.7–7.7)
Neutrophils Relative %: 75 % (ref 43–77)
Platelets: 181 10*3/uL (ref 150–400)
RBC: 4.12 MIL/uL (ref 3.87–5.11)
RDW: 13.8 % (ref 11.5–15.5)
WBC: 9 10*3/uL (ref 4.0–10.5)

## 2015-05-30 LAB — URINALYSIS, ROUTINE W REFLEX MICROSCOPIC
BILIRUBIN URINE: NEGATIVE
Glucose, UA: NEGATIVE mg/dL
Hgb urine dipstick: NEGATIVE
Ketones, ur: NEGATIVE mg/dL
Nitrite: NEGATIVE
PROTEIN: NEGATIVE mg/dL
Specific Gravity, Urine: 1.01 (ref 1.005–1.030)
UROBILINOGEN UA: 0.2 mg/dL (ref 0.0–1.0)
pH: 5.5 (ref 5.0–8.0)

## 2015-05-30 LAB — URINE MICROSCOPIC-ADD ON

## 2015-05-30 MED ORDER — GADOBENATE DIMEGLUMINE 529 MG/ML IV SOLN
17.0000 mL | Freq: Once | INTRAVENOUS | Status: AC | PRN
  Administered 2015-05-30: 17 mL via INTRAVENOUS

## 2015-05-30 NOTE — ED Notes (Signed)
PT stated she went to her chiropractor this morning and was told to come to ED to rule out stroke symptoms d/t left upper extremity weakness since she woke up this morning at 0700.

## 2015-05-30 NOTE — Telephone Encounter (Signed)
I called the patient and advised that she go to the ED. She had already talked to her PCP who told her the same thing and is on the way to the ED now.

## 2015-05-30 NOTE — ED Notes (Signed)
Patient attempting to walk out of emergency department with IV still in place. Patient was stopped before leaving premises. Patient angry yelling about lack of treatment and wait time. IV removed by Dyanne Iha, patient left AMA

## 2015-05-30 NOTE — Telephone Encounter (Signed)
Pt called, woke up with no strong with left hands and whole left side. Having trouble walking, Saw Chiropractor and he feels like she is having mini strokes and advised she contact Dr. Anne Hahn.

## 2015-05-30 NOTE — Telephone Encounter (Signed)
  Events noted. The patient has a nonorganic neurologic examination, she has nonorganic left-sided sensory and motor complaints. MRI done in the emergency room was unremarkable.  MRI brain 05/30/2015:  IMPRESSION: Negative MRI of the brain.

## 2015-05-30 NOTE — Telephone Encounter (Signed)
Got call from [facility] ER Dr. Clayborne Dana that pt came to ER for left upper extremity weakness since woke up as well as progressively gotten worse. She went to see her chiropractor who thought she had a mini stroke and sent her to ER for further evaluation.   As per Dr. Clarisa Kindred yesterday note, pt "continues to have a nonorganic sensory examination. The patient also has some give-away type weakness on the left arm. The patient has no true weakness."  It is likely pt continue to have this conversion type  or anxiety / stress related symptoms. Low suspicious for stroke. But agree with brain MRI to rule out stroke. If MRI negative, no further acute neuro work up needed. She will continue to follow up with Dr. Anne Hahn to get her other neuro work up done as outpt. Dr. Clayborne Dana agrees with the plan.  Marvel Plan, MD PhD Stroke Neurology 05/30/2015 1:08 PM  ADDENDUM:  Pt had MRI brain done which is negative for stroke.  Marvel Plan, MD PhD Stroke Neurology 05/30/2015 6:09 PM

## 2015-05-30 NOTE — ED Provider Notes (Signed)
CSN: [id number]     Arrival date & time 05/30/15  1141 History   First MD Initiated Contact with Patient 05/30/15 1207     Chief Complaint  Patient presents with  . Extremity Weakness     (Consider location/radiation/quality/duration/timing/severity/associated sxs/prior Treatment) HPI  34 year old female with a history of multiple neurologic problems presents immersed department today with left upper extremity weakness that shewoke up as well as progressively gotten better. She went to see her chiropractor who thought she had a mini stroke that could be coming from her heart so was sent here for further evaluation. She spoke with her primary care doctor and neurologist who also agrees ED evaluation. She states that she's also had 3 months of intermittent back pain associated with bilateral lower extremity sensation changes worse on the left. She states that neurology is working this up for her. Denies any recent fevers coughs or other signs of illness. She does have a high stress level at home.  Past Medical History  Diagnosis Date  . Anxiety   . Migraines   . Fibrocystic disease of both breasts   . Arthritis     both knees  . Depression 12/11/2014  . Body aches 12/11/2014  . Common migraine 12/19/2014  . Fibromyalgia 12/19/2014  . IBS (irritable bowel syndrome)   . Vitamin D deficiency   . Chronic insomnia   . Rebound headache 12/19/2014  . Seizures   . Chronic low back pain 05/29/2015  . Sciatica of left side    Past Surgical History  Procedure Laterality Date  . Cesarean section  [phone]  . Cholecystectomy  2001  . Tubal ligation    . Endometrial ablation     Family History  Problem Relation Age of Onset  . Hypertension Mother   . Other Mother     abnormal cells; had hyst  . Obesity Daughter   . Obesity Son   . Cancer Maternal Grandmother   . Osteoporosis Maternal Grandmother   . Cancer Maternal Grandfather   . Obesity Daughter   . Diabetes Father   .  Hypertension Father   . Alcohol abuse Father    History  Substance Use Topics  . Smoking status: Current Every Day Smoker -- 1.00 packs/day for 0 years    Types: Cigarettes  . Smokeless tobacco: Never Used  . Alcohol Use: No   OB History    Gravida Para Term Preterm AB TAB SAB Ectopic Multiple Living   3 3        3      Review of Systems  Constitutional: Negative for chills.  Eyes: Negative for pain.  Respiratory: Negative for choking and chest tightness.   Genitourinary: Negative for menstrual problem.  Neurological: Positive for weakness (LUE). Negative for dizziness, seizures, syncope, facial asymmetry and numbness.  All other systems reviewed and are negative.     Allergies  Chantix; Ciprofloxacin; Nortriptyline; Topamax; and Wellbutrin  Home Medications   Prior to Admission medications   Medication Sig Start Date End Date Taking? Authorizing Provider  ALPRAZolam Prudy Feeler) 0.5 MG tablet Take 0.5 mg by mouth 4 (four) times daily.  11/18/14  Yes Historical Provider, MD  HYDROcodone-acetaminophen (NORCO) 10-325 MG per tablet Take 1 tablet by mouth 4 (four) times daily.    Yes Historical Provider, MD  methocarbamol (ROBAXIN) 500 MG tablet Take 1 tablet (500 mg total) by mouth 2 (two) times daily as needed for muscle spasms. 05/29/15  Yes Eber Hong, MD  mirtazapine (REMERON) 30 MG  tablet Take 30 mg by mouth at bedtime.   Yes Historical Provider, MD  naproxen (NAPROSYN) 500 MG tablet Take 1 tablet (500 mg total) by mouth 2 (two) times daily with a meal. 05/29/15  Yes Eber Hong, MD  pregabalin (LYRICA) 100 MG capsule Take 1 capsule (100 mg total) by mouth 3 (three) times daily. 05/29/15  Yes York Spaniel, MD  Vitamin D, Ergocalciferol, (DRISDOL) 50000 UNITS CAPS capsule Take 50,000 Units by mouth every 30 (thirty) days. Last dose was on 03-17-15 11/20/14  Yes Historical Provider, MD  butalbital-acetaminophen-caffeine (FIORICET, ESGIC) 50-325-40 MG per tablet Take 1 tablet by  mouth every 4 (four) hours as needed for migraine.     Historical Provider, MD  cyanocobalamin (,VITAMIN B-12,) 1000 MCG/ML injection Inject 1,000 mcg into the muscle every 30 (thirty) days.  03/07/15   Historical Provider, MD   BP 99/70 mmHg  Pulse 76  Temp(Src) 98.7 F (37.1 C) (Oral)  Resp 11  Ht  (1.626 m)  Wt 184 lb (83.462 kg)  BMI 31.57 kg/m2  SpO2 99% Physical Exam  Constitutional: She is oriented to person, place, and time. She appears well-developed and well-nourished.  HENT:  Head: Normocephalic and atraumatic.  Eyes: Pupils are equal, round, and reactive to light.  Neck: Normal range of motion.  Cardiovascular: Normal rate and regular rhythm.   Pulmonary/Chest: Effort normal. No respiratory distress. She has no wheezes.  Musculoskeletal: Normal range of motion. She exhibits no edema or tenderness.  Neurological: She is alert and oriented to person, place, and time. No cranial nerve deficit. She exhibits normal muscle tone. Coordination normal.  LUE 4/5, decreased sensation to whole left side however she can feel and this is not new. Bicep and patellar reflexes 1+ symmeteric Finger to nose normal No pronator drift  Nursing note and vitals reviewed.   ED Course  Procedures (including critical care time) Labs Review Labs Reviewed  COMPREHENSIVE METABOLIC PANEL - Abnormal; Notable for the following:    AST 13 (*)    ALT 11 (*)    All other components within normal limits  URINALYSIS, ROUTINE W REFLEX MICROSCOPIC (NOT AT [facility]) - Abnormal; Notable for the following:    Leukocytes, UA SMALL (*)    All other components within normal limits  URINE MICROSCOPIC-ADD ON - Abnormal; Notable for the following:    Bacteria, UA FEW (*)    All other components within normal limits  CBC WITH DIFFERENTIAL/PLATELET    Imaging Review Dg Chest 2 View  05/30/2015   CLINICAL DATA:  34 year old female with left arm weakness and numbness.  EXAM: CHEST  2 VIEW  COMPARISON:   02/25/2005 radiographs  FINDINGS: The cardiomediastinal silhouette is unremarkable.  There is no evidence of focal airspace disease, pulmonary edema, suspicious pulmonary nodule/mass, pleural effusion, or pneumothorax. No acute bony abnormalities are identified.  IMPRESSION: No active cardiopulmonary disease.   Electronically Signed   By: Harmon Pier M.D.   On: 05/30/2015 12:50   Mr Laqueta Jean ZO Contrast  05/30/2015   CLINICAL DATA:  New onset left upper extremity weakness.  EXAM: MRI HEAD WITHOUT AND WITH CONTRAST  TECHNIQUE: Multiplanar, multiecho pulse sequences of the brain and surrounding structures were obtained without and with intravenous contrast.  CONTRAST:  17mL MULTIHANCE GADOBENATE DIMEGLUMINE 529 MG/ML IV SOLN  COMPARISON:  None.  FINDINGS: No acute infarct, hemorrhage, or mass lesion is present. No significant extraaxial fluid collection is present. The ventricles are of normal size.  Flow is  present in the major intracranial arteries. The globes and orbits are intact. The paranasal sinuses mastoid air cells are clear. Skullbase is within normal limits.  Midline structures are within normal limits.  IMPRESSION: Negative MRI of the brain.   Electronically Signed   By: Marin Roberts M.D.   On: 05/30/2015 13:49   Mr Thoracic Spine Wo Contrast  05/29/2015   CLINICAL DATA:  Back pain  EXAM: MRI THORACIC SPINE WITHOUT CONTRAST  TECHNIQUE: Multiplanar, multisequence MR imaging of the thoracic spine was performed. No intravenous contrast was administered.  COMPARISON:  None.  FINDINGS: Normal and thoracic alignment. Negative for fracture. 5 mm cystic lesion T9 may represent hemangioma or other benign lesion. No other bone marrow lesions identified.  Thoracic cord is normal.  No cord compression or cord lesion.  Negative for spinal stenosis. Negative for disc protrusion. No significant disc space narrowing.  IMPRESSION: Negative   Electronically Signed   By: Marlan Palau M.D.   On: 05/29/2015  15:37     EKG Interpretation None      MDM   Final diagnoses:  Upper extremity weakness   Suspect psychogenic cause for patient's weakness. Discussed it with her neurology office in [place] they felt the same however MRI done as this was apparently worse today. Rest of her labs and ECG were normal as documented above. On reexamination patient able to move her left arm without any apparent weakness. Able to hold her [facility] and poor self up in bed with her left arm. Still some weakness on exam approximately 4+ out of 5 however this may be volitional. I suspect possible conversion disorder for this patient suggested that she see a counselor at the same time and she is getting neurologic causes ruled out by her neurologist. Doubt MS, guillian barre or other causes at this time.   I have personally and contemperaneously reviewed labs and imaging and used in my decision making as above.   A medical screening exam was performed and I feel the patient has had an appropriate workup for their chief complaint at this time and likelihood of emergent condition existing is low. They have been counseled on decision, discharge, follow up and which symptoms necessitate immediate return to the emergency department. They or their family verbally stated understanding and agreement with plan and discharged in stable condition.      Marily Memos, MD 05/30/15 [phone]

## 2015-06-12 ENCOUNTER — Encounter: Payer: BLUE CROSS/BLUE SHIELD | Admitting: Neurology

## 2015-06-16 ENCOUNTER — Ambulatory Visit (INDEPENDENT_AMBULATORY_CARE_PROVIDER_SITE_OTHER): Payer: BLUE CROSS/BLUE SHIELD | Admitting: Neurology

## 2015-06-16 ENCOUNTER — Encounter: Payer: Self-pay | Admitting: Neurology

## 2015-06-16 ENCOUNTER — Telehealth: Payer: Self-pay | Admitting: Neurology

## 2015-06-16 ENCOUNTER — Ambulatory Visit (INDEPENDENT_AMBULATORY_CARE_PROVIDER_SITE_OTHER): Payer: Self-pay | Admitting: Neurology

## 2015-06-16 DIAGNOSIS — M545 Low back pain, unspecified: Secondary | ICD-10-CM

## 2015-06-16 DIAGNOSIS — M797 Fibromyalgia: Secondary | ICD-10-CM

## 2015-06-16 DIAGNOSIS — G8929 Other chronic pain: Secondary | ICD-10-CM

## 2015-06-16 DIAGNOSIS — G43019 Migraine without aura, intractable, without status migrainosus: Secondary | ICD-10-CM

## 2015-06-16 MED ORDER — PREGABALIN 150 MG PO CAPS: 150.0000 mg | ORAL_CAPSULE | Freq: Three times a day (TID) | ORAL | Status: DC

## 2015-06-16 NOTE — Telephone Encounter (Signed)
Pt called and is going to the ED due to pain.

## 2015-06-16 NOTE — Progress Notes (Signed)
Please refer to EMG and nerve conduction study procedure note.  The patient comes in today indicating that the pain continues to be significant, she will be referred for a left SI joint injection. The Lyrica dose will be increased. She will begin taking 150 mg 3 times daily. She will follow-up in about 4 months.

## 2015-06-16 NOTE — Procedures (Signed)
     HISTORY:  Mary Parker is a 34 year old patient who has chronic issues with left lower back discomfort, and pain radiating down the left leg to the foot. The patient is being evaluated for a possible lumbosacral radiculopathy.  NERVE CONDUCTION STUDIES:  Nerve conduction studies were performed on the left upper extremity. The distal motor latencies and motor amplitudes for the median and ulnar nerves were within normal limits. The F wave latencies and nerve conduction velocities for these nerves were also normal. The sensory latencies for the median and ulnar nerves were normal.  Nerve conduction studies were performed on both lower extremities. The distal motor latencies and motor amplitudes for the peroneal and posterior tibial nerves were within normal limits. The nerve conduction velocities for these nerves were also normal. The H reflex latencies were normal. The sensory latencies for the peroneal nerves were within normal limits.   EMG STUDIES:  EMG study was performed on the left lower extremity:  The tibialis anterior muscle reveals 2 to 4K motor units with full recruitment. No fibrillations or positive waves were seen. The peroneus tertius muscle reveals 2 to 4K motor units with full recruitment. No fibrillations or positive waves were seen. The medial gastrocnemius muscle reveals 1 to 3K motor units with full recruitment. No fibrillations or positive waves were seen. The vastus lateralis muscle reveals 2 to 4K motor units with full recruitment. No fibrillations or positive waves were seen. The iliopsoas muscle reveals 2 to 4K motor units with full recruitment. No fibrillations or positive waves were seen. The biceps femoris muscle (long head) reveals 2 to 4K motor units with full recruitment. No fibrillations or positive waves were seen. The lumbosacral paraspinal muscles were tested at 3 levels, and revealed no abnormalities of insertional activity at all 3 levels tested. There  was good relaxation.   IMPRESSION:  Nerve conduction studies done on the left upper extremity and both lower extremities reveals no evidence of a neuropathy. This study is normal. EMG evaluation of the left lower extremity is unremarkable, without evidence of an overlying lumbosacral radiculopathy.  Marlan Palau MD 06/16/2015 2:07 PM  [facility] [address] [place], [place] [address]  Phone [phone] Fax [phone]

## 2015-06-16 NOTE — Telephone Encounter (Signed)
Events noted, I did not call the patient. 

## 2015-06-16 NOTE — Telephone Encounter (Signed)
I called the patient. She had already left for the ED and does not have her cell phone with her.

## 2015-06-16 NOTE — Telephone Encounter (Addendum)
Patient is calling as she had a NCV about an hour ago and states she is in a lot of pain. Please call [phone](her home phone is not working)

## 2015-06-17 ENCOUNTER — Telehealth: Payer: Self-pay | Admitting: Neurology

## 2015-06-17 ENCOUNTER — Other Ambulatory Visit: Payer: Self-pay | Admitting: Neurology

## 2015-06-17 DIAGNOSIS — M5442 Lumbago with sciatica, left side: Secondary | ICD-10-CM

## 2015-06-17 NOTE — Telephone Encounter (Signed)
I called the patient. According to Jessica's phone note on 7/28, the patient was approved for Lyrica with Medicaid. I gave the patient the reference numbers listed in Jessica's note. She will call back if she needs anything else.

## 2015-06-17 NOTE — Telephone Encounter (Signed)
Pt is calling back regarding Lyrica prior auth for Medicaid. Pharmacy is telling her Berkley Harvey has not been done for Medicaid. According to Jessica's phone note from 7/28, [place] Auth for Lyrica effective until 05/23/16.

## 2015-06-17 NOTE — Telephone Encounter (Signed)
Pt is wanting to follow-up regarding [place] getting prior authorization for Lyrica.  Stated she needs it so Medicaid will pick up the remaining balance for her rx.  She can be reached at [phone].

## 2015-06-17 NOTE — Telephone Encounter (Signed)
It appears her dose was changed yesterday.  Unfortunately, Medicaid requires a new prior auth each time there is any type of adjustment, even if if the drug is the same.  I called Medicaid, spoke with Julianne.  Provided clinical info.  Request for new dose has been approved effective until 06/11/2016 Ref # [id number]; Call # [phone].  I called the patient back to advise.  Got no answer, left message.

## 2015-06-18 NOTE — Telephone Encounter (Signed)
Patient is calling back to discuss the prior approval from Medicaid.

## 2015-06-18 NOTE — Telephone Encounter (Signed)
I called the pharmacy.  Spoke with Marjean Donna.  She verified the Rx does go through ins without any problems.  I called the patient back.  She is aware.

## 2015-06-26 ENCOUNTER — Telehealth: Payer: Self-pay | Admitting: Neurology

## 2015-06-26 DIAGNOSIS — M5442 Lumbago with sciatica, left side: Secondary | ICD-10-CM

## 2015-06-26 NOTE — Telephone Encounter (Signed)
I called the patient and left a voicemail. I advised she call Dr. Hyacinth Meeker. He is the one who wrote for the Robaxin (muscle relaxer).

## 2015-06-26 NOTE — Telephone Encounter (Signed)
Patient called stating the muscle relaxer(pt did not name) is not working. She is still tense, very tired, difficult time working,can not focus. Please call and advise. Patient can be reached at [phone]. Starting to get some of the same symptoms on the right side as the left. It is difficult to walk, stand , sit, anything. Patient advised Dr Anne Hahn out of the office until Monday.

## 2015-06-26 NOTE — Telephone Encounter (Signed)
Events noted, I did not called the patient. 

## 2015-06-27 ENCOUNTER — Ambulatory Visit
Admission: RE | Admit: 2015-06-27 | Discharge: 2015-06-27 | Disposition: A | Discharge status: Home / Self Care | Payer: BLUE CROSS/BLUE SHIELD | Source: Ambulatory Visit | Attending: Neurology | Admitting: Neurology

## 2015-06-27 VITALS — BP 111/74 | HR 79

## 2015-06-27 DIAGNOSIS — M545 Low back pain, unspecified: Secondary | ICD-10-CM

## 2015-06-27 DIAGNOSIS — G8929 Other chronic pain: Secondary | ICD-10-CM

## 2015-06-27 DIAGNOSIS — M5442 Lumbago with sciatica, left side: Secondary | ICD-10-CM

## 2015-06-27 MED ORDER — DIAZEPAM 5 MG PO TABS
10.0000 mg | ORAL_TABLET | Freq: Once | ORAL | Status: AC
  Administered 2015-06-27: 10 mg via ORAL

## 2015-06-27 MED ORDER — IOHEXOL 180 MG/ML  SOLN
1.0000 mL | Freq: Once | INTRAMUSCULAR | Status: DC | PRN
  Administered 2015-06-27: 1 mL via INTRA_ARTICULAR

## 2015-06-27 NOTE — Discharge Instructions (Signed)

## 2015-06-30 MED ORDER — TIZANIDINE HCL 4 MG PO TABS: 4.0000 mg | ORAL_TABLET | Freq: Four times a day (QID) | ORAL | Status: DC | PRN

## 2015-06-30 NOTE — Addendum Note (Signed)
Addended by: Stephanie Acre on: 06/30/2015 11:35 AM   Modules accepted: Orders, Medications

## 2015-06-30 NOTE — Telephone Encounter (Addendum)
Pt called and states that tiZANidine (ZANAFLEX) 2 MG tabletis what she was prescribed by Dr. Anne Hahn. She is wondering if she can get a refill and also if the dosage can be increase. She states that the Robaxin was given to her in the ER and she never took it. She is currently out of medication.  She also states that she has had an injection in the SI joint and only had one days worth of relief with it. Please call and advise [phone]

## 2015-06-30 NOTE — Telephone Encounter (Signed)
I called patient. The patient has gained some benefit with the tizanidine 2 mg 3 times daily. We will go up to 4 mg 3 times daily. Prescription was called in. Patient has gained only one day of benefit with the SI joint injection for the left leg. MRI of the low back did not show any significant spine disease. The patient has seen a chiropractor, she has been seen through a pain center. She has not given a trial on TENS unit for the low back. We will send her for physical therapy for a TENS unit trial.

## 2015-07-03 NOTE — Telephone Encounter (Signed)
FYI-Patient is calling to say she is now getting slight relief probably from the combination of the injection and change in medication.

## 2015-07-10 ENCOUNTER — Encounter: Payer: Self-pay | Admitting: Neurology

## 2015-07-10 ENCOUNTER — Ambulatory Visit (INDEPENDENT_AMBULATORY_CARE_PROVIDER_SITE_OTHER): Payer: BLUE CROSS/BLUE SHIELD | Admitting: Neurology

## 2015-07-10 VITALS — BP 112/81 | HR 88 | Ht 64.0 in | Wt 193.5 lb

## 2015-07-10 DIAGNOSIS — G43019 Migraine without aura, intractable, without status migrainosus: Secondary | ICD-10-CM

## 2015-07-10 DIAGNOSIS — M797 Fibromyalgia: Secondary | ICD-10-CM

## 2015-07-10 DIAGNOSIS — M545 Low back pain, unspecified: Secondary | ICD-10-CM

## 2015-07-10 DIAGNOSIS — G8929 Other chronic pain: Secondary | ICD-10-CM

## 2015-07-10 MED ORDER — MILNACIPRAN HCL 12.5 MG PO TABS
ORAL_TABLET | ORAL | Status: DC
Start: 2015-07-10 — End: 2016-05-06

## 2015-07-10 NOTE — Patient Instructions (Addendum)
   We will add Savella to the medication regimen to treat the fibromyalgia. I will set you up for an SI joint injection on the right.  Fibromyalgia Fibromyalgia is a disorder that is often misunderstood. It is associated with muscular pains and tenderness that comes and goes. It is often associated with fatigue and sleep disturbances. Though it tends to be long-lasting, fibromyalgia is not life-threatening. CAUSES  The exact cause of fibromyalgia is unknown. People with certain gene types are predisposed to developing fibromyalgia and other conditions. Certain factors can play a role as triggers, such as:  Spine disorders.  Arthritis.  Severe injury (trauma) and other physical stressors.  Emotional stressors. SYMPTOMS   The main symptom is pain and stiffness in the muscles and joints, which can vary over time.  Sleep and fatigue problems. Other related symptoms may include:  Bowel and bladder problems.  Headaches.  Visual problems.  Problems with odors and noises.  Depression or mood changes.  Painful periods (dysmenorrhea).  Dryness of the skin or eyes. DIAGNOSIS  There are no specific tests for diagnosing fibromyalgia. Patients can be diagnosed accurately from the specific symptoms they have. The diagnosis is made by determining that nothing else is causing the problems. TREATMENT  There is no cure. Management includes medicines and an active, healthy lifestyle. The goal is to enhance physical fitness, decrease pain, and improve sleep. HOME CARE INSTRUCTIONS   Only take over-the-counter or prescription medicines as directed by your caregiver. Sleeping pills, tranquilizers, and pain medicines may make your problems worse.  Low-impact aerobic exercise is very important and advised for treatment. At first, it may seem to make pain worse. Gradually increasing your tolerance will overcome this feeling.  Learning relaxation techniques and how to control stress will help you.  Biofeedback, visual imagery, hypnosis, muscle relaxation, yoga, and meditation are all options.  Anti-inflammatory medicines and physical therapy may provide short-term help.  Acupuncture or massage treatments may help.  Take muscle relaxant medicines as suggested by your caregiver.  Avoid stressful situations.  Plan a healthy lifestyle. This includes your diet, sleep, rest, exercise, and friends.  Find and practice a hobby you enjoy.  Join a fibromyalgia support group for interaction, ideas, and sharing advice. This may be helpful. SEEK MEDICAL CARE IF:  You are not having good results or improvement from your treatment. FOR MORE INFORMATION  National Fibromyalgia Association: www.fmaware.org Arthritis Foundation: www.arthritis.org Document Released: 10/18/2005 Document Revised: 01/10/2012 Document Reviewed: 01/28/2010 [facility] Patient Information 2015 [place], [place]. This information is not intended to replace advice given to you by your health care provider. Make sure you discuss any questions you have with your health care provider.

## 2015-07-10 NOTE — Progress Notes (Signed)
Reason for visit: Fibromyalgia  Mary Parker is an 34 y.o. female  History of present illness:  Mary Parker is a 34 year old right-handed white female with a history of multiple somatic complaints. The patient was initially seen for headaches. She indicates that her headaches have improved over time, she is having only about one headache a week, and the headaches are improved with the use of Fioricet, and occasionally with ibuprofen. The patient has her ongoing usual fibromyalgia pain which has been improved somewhat with use of Lyrica taking 150 mg 3 times daily. The patient then developed left-sided back pain and left-sided leg pain. This responded to a left SI joint injection. The patient now returns with similar pain on the right side. The patient reports some tingly sensations down the leg to the knee. The patient has pain into the buttock and in the low back. [facility]. Prior EMG evaluation and nerve conduction study evaluation was unremarkable. MRI of the low back was relatively unremarkable. 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 She returns for an evaluation.  Past Medical History  Diagnosis Date  . Anxiety   . Migraines   . Fibrocystic disease of both breasts   . Arthritis     both knees  . Depression 12/11/2014  . Body aches 12/11/2014  . Common migraine 12/19/2014  . Fibromyalgia 12/19/2014  . IBS (irritable bowel syndrome)   . Vitamin D deficiency   . Chronic insomnia   . Rebound headache 12/19/2014  . Seizures   . Chronic low back pain 05/29/2015  . Sciatica of left side     Past Surgical History  Procedure Laterality Date  . Cesarean section  [phone]  . Cholecystectomy  2001  . Tubal ligation    . Endometrial ablation      Family History  Problem Relation Age of Onset  . Hypertension Mother   . Other Mother     abnormal cells; had hyst  . Obesity Daughter   . Obesity  Son   . Cancer Maternal Grandmother   . Osteoporosis Maternal Grandmother   . Cancer Maternal Grandfather   . Obesity Daughter   . Diabetes Father   . Hypertension Father   . Alcohol abuse Father     Social history:  reports that she has been smoking Cigarettes.  She has been smoking about 1.00 pack per day for the past 0 years. She has never used smokeless tobacco. She reports that she does not drink alcohol or use illicit drugs.    Allergies  Allergen Reactions  . Chantix [Varenicline] Other (See Comments)    Seizure   . Ciprofloxacin Hives    Sweating, dizziness  . Nortriptyline     GERD  . Topamax [Topiramate] Swelling    Dizziness and slurred speech  . Wellbutrin [Bupropion] Other (See Comments)    Suicidal ideation    Medications:  Prior to Admission medications   Medication Sig Start Date End Date Taking? Authorizing Provider  ALPRAZolam Prudy Feeler) 0.5 MG tablet Take 0.5 mg by mouth 4 (four) times daily.  11/18/14  Yes Historical Provider, MD  ARIPiprazole (ABILIFY) 5 MG tablet Take 5 mg by mouth daily.   Yes Historical Provider, MD  butalbital-acetaminophen-caffeine (FIORICET, ESGIC) 50-325-40 MG per tablet Take 1 tablet by mouth every 4 (four) hours as needed for migraine.    Yes Historical Provider, MD  cyanocobalamin (,VITAMIN B-12,) 1000 MCG/ML  injection Inject 1,000 mcg into the muscle every 30 (thirty) days.  03/07/15  Yes Historical Provider, MD  ibuprofen (ADVIL,MOTRIN) 800 MG tablet Take 800 mg by mouth 3 (three) times daily.   Yes Historical Provider, MD  mirtazapine (REMERON) 30 MG tablet Take 30 mg by mouth at bedtime.   Yes Historical Provider, MD  pregabalin (LYRICA) 150 MG capsule Take 1 capsule (150 mg total) by mouth 3 (three) times daily. 06/16/15  Yes York Spaniel, MD  tiZANidine (ZANAFLEX) 4 MG tablet Take 1 tablet (4 mg total) by mouth every 6 (six) hours as needed for muscle spasms. 06/30/15  Yes York Spaniel, MD  varenicline (CHANTIX) 0.5 MG tablet  Take 0.5 mg by mouth 2 (two) times daily.   Yes Historical Provider, MD  Vitamin D, Ergocalciferol, (DRISDOL) 50000 UNITS CAPS capsule Take 50,000 Units by mouth every 30 (thirty) days. Last dose was on 03-17-15 11/20/14  Yes Historical Provider, MD    ROS:  Out of a complete 14 system review of symptoms, the patient complains only of the following symptoms, and all other reviewed systems are negative.  Fatigue Runny nose, difficulty swallowing Cough, wheezing, shortness of breath, chest tightness Diarrhea Insomnia, daytime sleepiness, snoring Joint pain, back pain, achy muscles, walking difficulty, neck stiffness Dizziness, headache, weakness Depression, anxiety  Blood pressure 112/81, pulse 88, height  (1.626 m), weight 193 lb 8 oz (87.771 kg).  Physical Exam  General: The patient is alert and cooperative at the time of the examination. The patient is moderately to markedly obese.  Neuromuscular: Range of movement of the low back is full. There is some tenderness with palpation of the right SI joint. The patient has some discomfort with internal and external rotation of the right hip, not present on the left.  Skin: No significant peripheral edema is noted.   Neurologic Exam  Mental status: The patient is alert and oriented x 3 at the time of the examination. The patient has apparent normal recent and remote memory, with an apparently normal attention span and concentration ability.   Cranial nerves: Facial symmetry is present. Speech is normal, no aphasia or dysarthria is noted. Extraocular movements are full. Visual fields are full.  Motor: The patient has good strength in all 4 extremities.  Sensory examination: Soft touch sensation is decreased on the left face, arm, and leg.  Coordination: The patient has good finger-nose-finger and heel-to-shin bilaterally.  Gait and station: The patient has a limping gait on the right leg. Tandem gait is slightly unsteady. Romberg  is negative. No drift is seen.  Reflexes: Deep tendon reflexes are symmetric.   Assessment/Plan:  1. Migraine headache  2. Fibromyalgia  3. Right back and leg pain  4. Left hemisensory deficit, nonorganic  The patient continues to have new somatic complaints. The patient now complains of right-sided low back discomfort. She will be set up for a right SI joint injection. Amada Jupiter will be added to the Lyrica for the fibromyalgia. She will follow-up in 4 or 5 months. She apparently has been seen through a pain center, she is to continue her treatments through her chiropractor as well.  Marlan Palau MD 07/10/2015 7:11 PM  [facility] [address] [place], [place] [address]  Phone [phone] Fax [phone]

## 2015-07-14 ENCOUNTER — Other Ambulatory Visit: Payer: Self-pay | Admitting: Neurology

## 2015-07-14 DIAGNOSIS — M545 Low back pain, unspecified: Secondary | ICD-10-CM

## 2015-07-14 DIAGNOSIS — G8929 Other chronic pain: Secondary | ICD-10-CM

## 2015-07-15 ENCOUNTER — Ambulatory Visit (HOSPITAL_COMMUNITY)
Admission: RE | Admit: 2015-07-15 | Discharge: 2015-07-15 | Disposition: A | Discharge status: Home / Self Care | Payer: BLUE CROSS/BLUE SHIELD | Source: Ambulatory Visit | Attending: Family Medicine | Admitting: Family Medicine

## 2015-07-15 ENCOUNTER — Other Ambulatory Visit (HOSPITAL_COMMUNITY): Payer: Self-pay | Admitting: Family Medicine

## 2015-07-15 DIAGNOSIS — J069 Acute upper respiratory infection, unspecified: Secondary | ICD-10-CM

## 2015-07-15 DIAGNOSIS — R0989 Other specified symptoms and signs involving the circulatory and respiratory systems: Secondary | ICD-10-CM | POA: Insufficient documentation

## 2015-07-15 DIAGNOSIS — R05 Cough: Secondary | ICD-10-CM | POA: Diagnosis present

## 2015-07-21 ENCOUNTER — Ambulatory Visit
Admission: RE | Admit: 2015-07-21 | Discharge: 2015-07-21 | Disposition: A | Discharge status: Home / Self Care | Payer: BLUE CROSS/BLUE SHIELD | Source: Ambulatory Visit | Attending: Neurology | Admitting: Neurology

## 2015-07-21 DIAGNOSIS — G8929 Other chronic pain: Secondary | ICD-10-CM

## 2015-07-21 DIAGNOSIS — M545 Low back pain: Principal | ICD-10-CM

## 2015-07-21 MED ORDER — DIAZEPAM 5 MG PO TABS
10.0000 mg | ORAL_TABLET | Freq: Once | ORAL | Status: AC
  Administered 2015-07-21: 10 mg via ORAL

## 2015-07-21 MED ORDER — METHYLPREDNISOLONE ACETATE 40 MG/ML INJ SUSP (RADIOLOG
120.0000 mg | Freq: Once | INTRAMUSCULAR | Status: AC
  Administered 2015-07-21: 120 mg via INTRA_ARTICULAR

## 2015-07-21 MED ORDER — IOHEXOL 180 MG/ML  SOLN
1.0000 mL | Freq: Once | INTRAMUSCULAR | Status: DC | PRN
  Administered 2015-07-21: 1 mL via INTRA_ARTICULAR

## 2015-07-28 ENCOUNTER — Other Ambulatory Visit: Payer: Self-pay | Admitting: Neurology

## 2015-07-28 ENCOUNTER — Telehealth: Payer: Self-pay | Admitting: Neurology

## 2015-07-28 DIAGNOSIS — M533 Sacrococcygeal disorders, not elsewhere classified: Secondary | ICD-10-CM

## 2015-07-28 NOTE — Telephone Encounter (Signed)
I called patient. The patient indicates that the SI joint injections have been helpful, the left side is not coming back, it was markedly improved from his 1 month after the injection. I'll set this up again. The right side was done recently, this has worked well on this side.

## 2015-07-28 NOTE — Telephone Encounter (Signed)
Pt called and states that her back and legs are really hurting. She wants to know if she can come in for another injection. Says its the same felling as before. [phone]

## 2015-07-28 NOTE — Telephone Encounter (Signed)
I called the patient. She stated the injection she had done at [facility] worked great. It relieved her pain until today. She is starting to have discomfort in her back and legs again. She would like to know if she can have another injection. I advised that Dr. Anne Hahn is not in the office today but I would check with him and we would call her back tomorrow.

## 2015-07-30 ENCOUNTER — Telehealth: Payer: Self-pay | Admitting: Neurology

## 2015-07-30 MED ORDER — PREGABALIN 200 MG PO CAPS: 200.0000 mg | ORAL_CAPSULE | Freq: Three times a day (TID) | ORAL | Status: DC

## 2015-07-30 NOTE — Telephone Encounter (Signed)
The patient is doing well with the Lyrica, but she is having some increased muscle pain recently, tolerating the medication well. I will go up on the Lyrica to 200 mg times daily.

## 2015-07-30 NOTE — Telephone Encounter (Signed)
Pt called stating Lyrica is not working again. She is feeling tension in neck, arms, legs and back. She called pharmacy  2 days ago for refill and they said have not heard back from [facility]. I looked and explained it was refused due to having 3 refills left. She is wondering if RX needs to be increased and if so it will need prior auth and she is requesting it to be sent [facility] in [place]. She is changing pharmacy as she's had a lot of problems with [employer] .  She will be out as of tomorrow morning. Please call and advise. Patient can be reached at [phone].

## 2015-07-31 ENCOUNTER — Other Ambulatory Visit: Payer: Self-pay | Admitting: Neurology

## 2015-07-31 NOTE — Telephone Encounter (Signed)
I called Mary Parker (spoke to Trip) and canceled all refills for Lyrica. I also called patient and left voicemail letting her know Rx was sent to [facility] in [place], as requested.

## 2015-07-31 NOTE — Telephone Encounter (Signed)
Patient is calling. She states [facility] in [place] states they need a prior authorization for medication pregabalin (LYRICA) 200 MG capsule The patient states she will run out of medication today.

## 2015-07-31 NOTE — Telephone Encounter (Signed)
I called [employer] and spoke to [employer]. She stated that the patient did not need a prior authorization through them. I called the patient to let her know and she stated it was medicaid that needed the prior authorization. I called medicaid and spoke to [place]. Lyrica 200 mg three times daily was approved for 1 year. PA #: [id number]. I called the patient to let her know and left a voicemail.

## 2015-08-06 ENCOUNTER — Other Ambulatory Visit: Payer: Self-pay | Admitting: Neurology

## 2015-08-06 DIAGNOSIS — M533 Sacrococcygeal disorders, not elsewhere classified: Secondary | ICD-10-CM

## 2015-08-07 ENCOUNTER — Ambulatory Visit: Payer: BLUE CROSS/BLUE SHIELD | Attending: Neurology

## 2015-08-29 ENCOUNTER — Ambulatory Visit
Admission: RE | Admit: 2015-08-29 | Discharge: 2015-08-29 | Disposition: A | Discharge status: Home / Self Care | Payer: BLUE CROSS/BLUE SHIELD | Source: Ambulatory Visit | Attending: Neurology | Admitting: Neurology

## 2015-08-29 DIAGNOSIS — M533 Sacrococcygeal disorders, not elsewhere classified: Secondary | ICD-10-CM

## 2015-08-29 MED ORDER — METHYLPREDNISOLONE ACETATE 40 MG/ML INJ SUSP (RADIOLOG
120.0000 mg | Freq: Once | INTRAMUSCULAR | Status: AC
  Administered 2015-08-29: 120 mg via INTRA_ARTICULAR

## 2015-08-29 MED ORDER — IOHEXOL 180 MG/ML  SOLN
1.0000 mL | Freq: Once | INTRAMUSCULAR | Status: DC | PRN
  Administered 2015-08-29: 1 mL via INTRA_ARTICULAR

## 2015-11-10 ENCOUNTER — Ambulatory Visit: Payer: BLUE CROSS/BLUE SHIELD | Admitting: Neurology

## 2015-11-12 ENCOUNTER — Telehealth: Payer: Self-pay

## 2015-11-12 NOTE — Telephone Encounter (Signed)
I called the patient and asked her to call back to r/s appointment from 11/10/15.

## 2015-11-13 NOTE — Telephone Encounter (Signed)
I called the patient and asked her to call back to r/s appointment from 11/10/15. I have also mailed a letter to the patient requesting she call to r/s. I called her twice from home to r/s during the snow/ice as well.

## 2015-12-06 ENCOUNTER — Other Ambulatory Visit: Payer: Self-pay | Admitting: Neurology

## 2016-03-21 ENCOUNTER — Other Ambulatory Visit: Payer: Self-pay | Admitting: Neurology

## 2016-03-22 ENCOUNTER — Telehealth: Payer: Self-pay | Admitting: *Deleted

## 2016-03-22 DIAGNOSIS — M533 Sacrococcygeal disorders, not elsewhere classified: Secondary | ICD-10-CM

## 2016-03-22 NOTE — Telephone Encounter (Signed)
------------------------------------------------------------   [facility]                CID [phone]  Patient SAME                 Pt's Dr Anne HahnWILLIS       Area Code 336 Phone# [phone] * DOB [phone]     RE NEEDS A REFERRAL FOR INJECTION TO [facility]      IMAGINING, FOR LEFT SIDE                             Disp:Y/N N If Y = C/B If No Response In 20minutes ============================================================

## 2016-03-22 NOTE — Telephone Encounter (Signed)
I called the patient, she is now having SI joint issues on the left, she has done well, the last injection was on 08/29/2015.

## 2016-03-23 ENCOUNTER — Other Ambulatory Visit: Payer: Self-pay | Admitting: Neurology

## 2016-03-23 DIAGNOSIS — M533 Sacrococcygeal disorders, not elsewhere classified: Secondary | ICD-10-CM

## 2016-03-23 DIAGNOSIS — G8929 Other chronic pain: Secondary | ICD-10-CM

## 2016-03-23 DIAGNOSIS — M549 Dorsalgia, unspecified: Principal | ICD-10-CM

## 2016-04-02 ENCOUNTER — Ambulatory Visit
Admission: RE | Admit: 2016-04-02 | Discharge: 2016-04-02 | Disposition: A | Discharge status: Home / Self Care | Payer: Medicaid Other | Source: Ambulatory Visit | Attending: Neurology | Admitting: Neurology

## 2016-04-02 VITALS — BP 125/76

## 2016-04-02 DIAGNOSIS — G8929 Other chronic pain: Secondary | ICD-10-CM

## 2016-04-02 DIAGNOSIS — M545 Low back pain, unspecified: Secondary | ICD-10-CM

## 2016-04-02 DIAGNOSIS — M533 Sacrococcygeal disorders, not elsewhere classified: Secondary | ICD-10-CM

## 2016-04-02 MED ORDER — DIAZEPAM 5 MG PO TABS
5.0000 mg | ORAL_TABLET | Freq: Once | ORAL | Status: AC
  Administered 2016-04-02: 5 mg via ORAL

## 2016-04-02 MED ORDER — METHYLPREDNISOLONE ACETATE 40 MG/ML INJ SUSP (RADIOLOG
120.0000 mg | Freq: Once | INTRAMUSCULAR | Status: AC
  Administered 2016-04-02: 120 mg via INTRA_ARTICULAR

## 2016-04-02 MED ORDER — IOPAMIDOL (ISOVUE-M 200) INJECTION 41%
1.0000 mL | Freq: Once | INTRAMUSCULAR | Status: AC
  Administered 2016-04-02: 1 mL via INTRA_ARTICULAR

## 2016-04-04 ENCOUNTER — Other Ambulatory Visit: Payer: Self-pay | Admitting: Neurology

## 2016-05-03 ENCOUNTER — Telehealth: Payer: Self-pay | Admitting: Neurology

## 2016-05-03 NOTE — Telephone Encounter (Signed)
I called patient, left a message. I'll need to see the patient in the office, we will go over the various issues at hand, and decide upon a treatment plan.

## 2016-05-03 NOTE — Telephone Encounter (Signed)
Spoke to patient - a follow up appt has been scheduled.

## 2016-05-03 NOTE — Telephone Encounter (Signed)
Pt's last OV was Sept of last year. Her follow-up appt in January was cancelled d/t inclement weather.  Despite multiple attempts from staff to reschedule appt, pt has not followed up. She did have SI joint injections in Sept, Oct and again in May w/ good results. Savella, Lyrica and tizanidine prescribed previously by Dr. Anne HahnWillis. Returned call to discuss further w/ pt but no answer. Pt may call back to schedule work-in appt this week on Wed or Thurs @ noon in order to be seen before insurance runs out.

## 2016-05-03 NOTE — Telephone Encounter (Signed)
Patient called to advise, "a couple of months ago, Lyrica caused tremors, cut Lyrica back to one time at night, now pain is increasingly unbearable during the day. What other avenues to explore?", states PCP tried her on Tramadol, and is not helping, PCP suggested to contact Dr. Anne HahnWillis. Patient also states, "she only has insurance another month and is a single mom of 3".

## 2016-05-06 ENCOUNTER — Ambulatory Visit (INDEPENDENT_AMBULATORY_CARE_PROVIDER_SITE_OTHER): Payer: Medicaid Other | Admitting: Neurology

## 2016-05-06 ENCOUNTER — Encounter: Payer: Self-pay | Admitting: Neurology

## 2016-05-06 VITALS — BP 103/70 | HR 79 | Ht 64.0 in | Wt 180.0 lb

## 2016-05-06 DIAGNOSIS — M797 Fibromyalgia: Secondary | ICD-10-CM

## 2016-05-06 DIAGNOSIS — M545 Low back pain: Secondary | ICD-10-CM

## 2016-05-06 DIAGNOSIS — G43019 Migraine without aura, intractable, without status migrainosus: Secondary | ICD-10-CM | POA: Diagnosis not present

## 2016-05-06 DIAGNOSIS — M2550 Pain in unspecified joint: Secondary | ICD-10-CM | POA: Diagnosis not present

## 2016-05-06 DIAGNOSIS — G8929 Other chronic pain: Secondary | ICD-10-CM | POA: Diagnosis not present

## 2016-05-06 MED ORDER — TIZANIDINE HCL 2 MG PO TABS: ORAL_TABLET | ORAL | Status: DC

## 2016-05-06 MED ORDER — MELOXICAM 15 MG PO TABS: 15.0000 mg | ORAL_TABLET | Freq: Every day | ORAL | Status: DC

## 2016-05-06 NOTE — Progress Notes (Signed)
Reason for visit: fibromyalgia  Mary HurtCasey J Parker is an 35 y.o. female  History of present illness:  Mary Parker is a 35 year old right-handed white female with a history of fibromyalgia. The patient has had some back pain that is chronic, she has some discomfort over the SI joints bilaterally, left greater than right. SI joint injections have been beneficial in the past. The patient just had the left-side injected on 04/02/2016. The patient is on Lyrica taking 200 mg at night, she cannot tolerate the drug during the day taking 3 daily. The patient is on tizanidine taking 2 mg twice during the day, 4 mg at night. She reports some new symptoms of sharp right knee pain, she also has some discomfort in the joints of the left hand associated with some swelling, typically worse in the morning. She is working full-time currently. She reports that after several hours of working on the computer she develops some neck and shoulder discomfort. She takes ibuprofen, but this is upsetting her stomach. She returns to this office for an evaluation.  Past Medical History  Diagnosis Date  . Anxiety   . Migraines   . Fibrocystic disease of both breasts   . Arthritis     both knees  . Depression 12/11/2014  . Body aches 12/11/2014  . Common migraine 12/19/2014  . Fibromyalgia 12/19/2014  . IBS (irritable bowel syndrome)   . Vitamin D deficiency   . Chronic insomnia   . Rebound headache 12/19/2014  . Seizures (HCC)   . Chronic low back pain 05/29/2015  . Sciatica of left side     Past Surgical History  Procedure Laterality Date  . Cesarean section  [phone],2000,2004  . Cholecystectomy  2001  . Tubal ligation    . Endometrial ablation      Family History  Problem Relation Age of Onset  . Hypertension Mother   . Other Mother     abnormal cells; had hyst  . Obesity Daughter   . Obesity Son   . Cancer Maternal Grandmother   . Osteoporosis Maternal Grandmother   . Cancer Maternal Grandfather   . Obesity  Daughter   . Diabetes Father   . Hypertension Father   . Alcohol abuse Father     Social history:  reports that she has been smoking Cigarettes.  She has been smoking about 1.00 pack per day for the past 0 years. She has never used smokeless tobacco. She reports that she does not drink alcohol or use illicit drugs.    Allergies  Allergen Reactions  . Chantix [Varenicline] Other (See Comments)    Seizure   . Ciprofloxacin Hives    Sweating, dizziness  . Nortriptyline     GERD  . Topamax [Topiramate] Swelling    Dizziness and slurred speech  . Wellbutrin [Bupropion] Other (See Comments)    Suicidal ideation    Medications:  Prior to Admission medications   Medication Sig Start Date End Date Taking? Authorizing Provider  ALPRAZolam Prudy Feeler(XANAX) 0.5 MG tablet Take 0.5 mg by mouth 4 (four) times daily.  11/18/14   Historical Provider, MD  ARIPiprazole (ABILIFY) 5 MG tablet Take 5 mg by mouth daily.    Historical Provider, MD  butalbital-acetaminophen-caffeine (FIORICET, ESGIC) 50-325-40 MG per tablet Take 1 tablet by mouth every 4 (four) hours as needed for migraine.     Historical Provider, MD  cyanocobalamin (,VITAMIN B-12,) 1000 MCG/ML injection Inject 1,000 mcg into the muscle every 30 (thirty) days.  03/07/15  Historical Provider, MD  ibuprofen (ADVIL,MOTRIN) 800 MG tablet Take 800 mg by mouth 3 (three) times daily.    Historical Provider, MD  Milnacipran HCl (SAVELLA) 12.5 MG TABS One tablet daily for 5 days, then 1 tablet twice a day for 5 days, then 2 tablets twice a day 07/10/15   York Spanielharles K Danh Bayus, MD  mirtazapine (REMERON) 30 MG tablet Take 30 mg by mouth at bedtime.    Historical Provider, MD  pregabalin (LYRICA) 200 MG capsule Take 1 capsule (200 mg total) by mouth 3 (three) times daily. 07/30/15   York Spanielharles K Kourtni Stineman, MD  tiZANidine (ZANAFLEX) 4 MG tablet TAKE 1 TABLET BY MOUTH EVERY 6 HOURS AS NEEDED 12/09/15   York Spanielharles K Yeva Bissette, MD  varenicline (CHANTIX) 0.5 MG tablet Take 0.5 mg by  mouth 2 (two) times daily.    Historical Provider, MD  Vitamin D, Ergocalciferol, (DRISDOL) 50000 UNITS CAPS capsule Take 50,000 Units by mouth every 30 (thirty) days. Last dose was on 03-17-15 11/20/14   Historical Provider, MD    ROS:  Out of a complete 14 system review of symptoms, the patient complains only of the following symptoms, and all other reviewed systems are negative.  Excessive thirst Insomnia, daytime sleepiness, snoring Joint pain, back pain, achy muscles, muscle cramps, neck pain, neck stiffness  Blood pressure 103/70, pulse 79, height 5\' 4"  (1.626 m), weight 180 lb (81.647 kg).  Physical Exam  General: The patient is alert and cooperative at the time of the examination. The patient is moderately obese.  Neuromuscular: Range of movement the low back is full, minimal tenderness is noted with palpation of the low back.  Skin: No significant peripheral edema is noted.   Neurologic Exam  Mental status: The patient is alert and oriented x 3 at the time of the examination. The patient has apparent normal recent and remote memory, with an apparently normal attention span and concentration ability.   Cranial nerves: Facial symmetry is present. Speech is normal, no aphasia or dysarthria is noted. Extraocular movements are full. Visual fields are full.  Motor: The patient has good strength in all 4 extremities.  Sensory examination: Soft touch sensation is somewhat reduced on the right face, arm, and leg.  Coordination: The patient has good finger-nose-finger and heel-to-shin bilaterally.  Gait and station: The patient has a normal gait. Tandem gait is normal. Romberg is negative. No drift is seen.  Reflexes: Deep tendon reflexes are symmetric.   Assessment/Plan:  1. Fibromyalgia  2. Bilateral SI joint dysfunction  3. Chronic low back pain  4. Right-sided sensory deficit, nonorganic  The patient overall is doing well, she reports some new joint discomfort in  the right knee and in the left hand. The patient will be sent for blood work today, she will be taken off of ibuprofen, switched to Mobic. A prescription was given for tizanidine, she will continue Lyrica. She will follow-up in 6 months, sooner needed. She is to stretch out frequently while at work to avoid neuromuscular discomfort.  Marlan Palau. Keith Citlali Gautney MD 05/06/2016 7:52 AM  [facility] [address] [place], [place] [id number]  Phone [phone] Fax [phone]

## 2016-05-06 NOTE — Patient Instructions (Signed)

## 2016-05-07 LAB — B. BURGDORFI ANTIBODIES

## 2016-05-07 LAB — SEDIMENTATION RATE: SED RATE: 2 mm/h (ref 0–32)

## 2016-05-07 LAB — ANA W/REFLEX: Anti Nuclear Antibody(ANA): NEGATIVE

## 2016-05-07 LAB — RHEUMATOID FACTOR

## 2016-05-12 ENCOUNTER — Telehealth: Payer: Self-pay | Admitting: Neurology

## 2016-05-12 NOTE — Telephone Encounter (Signed)
Called pt w/ unremarkable lab results. May call back w/ further questions/concerns. 

## 2016-05-12 NOTE — Telephone Encounter (Signed)
Pt called request lab results.  °

## 2016-05-24 ENCOUNTER — Telehealth: Payer: Self-pay | Admitting: Neurology

## 2016-05-24 ENCOUNTER — Encounter (HOSPITAL_COMMUNITY): Payer: Self-pay | Admitting: Emergency Medicine

## 2016-05-24 ENCOUNTER — Emergency Department (HOSPITAL_COMMUNITY)
Admission: EM | Admit: 2016-05-24 | Discharge: 2016-05-24 | Disposition: A | Discharge status: Home / Self Care | Payer: Medicaid Other | Attending: Emergency Medicine | Admitting: Emergency Medicine

## 2016-05-24 DIAGNOSIS — F329 Major depressive disorder, single episode, unspecified: Secondary | ICD-10-CM | POA: Diagnosis not present

## 2016-05-24 DIAGNOSIS — K625 Hemorrhage of anus and rectum: Secondary | ICD-10-CM | POA: Diagnosis not present

## 2016-05-24 DIAGNOSIS — R197 Diarrhea, unspecified: Secondary | ICD-10-CM | POA: Insufficient documentation

## 2016-05-24 DIAGNOSIS — M1990 Unspecified osteoarthritis, unspecified site: Secondary | ICD-10-CM | POA: Insufficient documentation

## 2016-05-24 DIAGNOSIS — R1084 Generalized abdominal pain: Secondary | ICD-10-CM | POA: Diagnosis not present

## 2016-05-24 DIAGNOSIS — F17210 Nicotine dependence, cigarettes, uncomplicated: Secondary | ICD-10-CM | POA: Insufficient documentation

## 2016-05-24 LAB — URINALYSIS, ROUTINE W REFLEX MICROSCOPIC
BILIRUBIN URINE: NEGATIVE
Glucose, UA: NEGATIVE mg/dL
HGB URINE DIPSTICK: NEGATIVE
KETONES UR: NEGATIVE mg/dL
Leukocytes, UA: NEGATIVE
NITRITE: NEGATIVE
PROTEIN: NEGATIVE mg/dL
Specific Gravity, Urine: 1.015 (ref 1.005–1.030)
pH: 6 (ref 5.0–8.0)

## 2016-05-24 LAB — CBC
HCT: 41.8 % (ref 36.0–46.0)
HEMOGLOBIN: 13.5 g/dL (ref 12.0–15.0)
MCH: 30.8 pg (ref 26.0–34.0)
MCHC: 32.3 g/dL (ref 30.0–36.0)
MCV: 95.4 fL (ref 78.0–100.0)
PLATELETS: 191 10*3/uL (ref 150–400)
RBC: 4.38 MIL/uL (ref 3.87–5.11)
RDW: 13.3 % (ref 11.5–15.5)
WBC: 6.9 10*3/uL (ref 4.0–10.5)

## 2016-05-24 LAB — COMPREHENSIVE METABOLIC PANEL
ALBUMIN: 4 g/dL (ref 3.5–5.0)
ALK PHOS: 58 U/L (ref 38–126)
ALT: 14 U/L (ref 14–54)
ANION GAP: 3 — AB (ref 5–15)
AST: 14 U/L — ABNORMAL LOW (ref 15–41)
BILIRUBIN TOTAL: 0.4 mg/dL (ref 0.3–1.2)
BUN: 9 mg/dL (ref 6–20)
CALCIUM: 9.4 mg/dL (ref 8.9–10.3)
CO2: 28 mmol/L (ref 22–32)
CREATININE: 0.71 mg/dL (ref 0.44–1.00)
Chloride: 107 mmol/L (ref 101–111)
GFR calc non Af Amer: >60 mL/min (ref >60)
GLUCOSE: 93 mg/dL (ref 65–99)
Potassium: 3.7 mmol/L (ref 3.5–5.1)
SODIUM: 138 mmol/L (ref 135–145)
TOTAL PROTEIN: 7.3 g/dL (ref 6.5–8.1)

## 2016-05-24 LAB — POC OCCULT BLOOD, ED: FECAL OCCULT BLD: NEGATIVE

## 2016-05-24 LAB — LIPASE, BLOOD: LIPASE: 22 U/L (ref 11–51)

## 2016-05-24 MED ORDER — SODIUM CHLORIDE 0.9 % IV BOLUS (SEPSIS)
1000.0000 mL | Freq: Once | INTRAVENOUS | Status: AC
  Administered 2016-05-24: 1000 mL via INTRAVENOUS

## 2016-05-24 MED ORDER — DICYCLOMINE HCL 20 MG PO TABS: 20.0000 mg | ORAL_TABLET | Freq: Two times a day (BID) | ORAL | 0 refills | Status: DC

## 2016-05-24 MED ORDER — ONDANSETRON HCL 4 MG PO TABS: 4.0000 mg | ORAL_TABLET | Freq: Four times a day (QID) | ORAL | 0 refills | Status: DC

## 2016-05-24 MED ORDER — MORPHINE SULFATE (PF) 4 MG/ML IV SOLN
4.0000 mg | Freq: Once | INTRAVENOUS | Status: AC
  Administered 2016-05-24: 4 mg via INTRAVENOUS
  Filled 2016-05-24: qty 1

## 2016-05-24 NOTE — ED Triage Notes (Signed)
Pt reports has been taking NSAIDS for last several months for fibromyalgia nad osteoarthritis. Pt reports was seen by PCP and told to come to ER for further evaluation of rectal bleeding and black stools that started 2 days ago.pt reports intense abdominal pain/cramping sensation. Pt denies n/v.

## 2016-05-24 NOTE — Telephone Encounter (Signed)
I called patient. The patient apparently has had some stomach upset on the meloxicam, she is to stop the medication, go on Prilosec 20 mg daily for the next 2-3 weeks. If the stomach upset continues, she is to contact me.

## 2016-05-24 NOTE — ED Provider Notes (Signed)
Emergency Department Provider Note  Time seen: Approximately 5:12 PM  I have reviewed the triage vital signs and the nursing notes.   HISTORY  Chief Complaint Rectal Bleeding   HPI Mary Parker is a 35 y.o. female with PMH of fibromyalgia, anxiety, and seizures presents to the emergency department for evaluation of 3 days of diffuse abdominal discomfort, distention, black foul-smelling stools. She has some associated lightheadedness. The patient has no history of GI bleed. She is not anticoagulated. She reports Yvonne Stopher history of NSAID use and recently started meloxicam which is new for her. When she began having black stools she discontinued this medication. She has been hesitant to take additional medications because of her abdominal discomfort. She denies associated dysuria, fevers, shaking chills. She does somewhat lightheaded especially when standing. She denies any hematemesis or hemoptysis.   Past Medical History:  Diagnosis Date  . Anxiety   . Arthritis    both knees  . Body aches 12/11/2014  . Chronic insomnia   . Chronic low back pain 05/29/2015  . Common migraine 12/19/2014  . Depression 12/11/2014  . Fibrocystic disease of both breasts   . Fibromyalgia 12/19/2014  . IBS (irritable bowel syndrome)   . Migraines   . Rebound headache 12/19/2014  . Sciatica of left side   . Seizures (HCC)   . Vitamin D deficiency     Patient Active Problem List   Diagnosis Date Noted  . Chronic low back pain 05/29/2015  . Common migraine 12/19/2014  . Fibromyalgia 12/19/2014  . Rebound headache 12/19/2014  . Depression 12/11/2014  . Body aches 12/11/2014    Past Surgical History:  Procedure Laterality Date  . CESAREAN SECTION  [phone]  . CHOLECYSTECTOMY  2001  . ENDOMETRIAL ABLATION    . TUBAL LIGATION      Current Outpatient Rx  . Order #: [id number] Class: Historical Med  . Order #: [id number] Class: Historical Med  . Order #: [id number] Class: Historical Med  . Order  #: [id number] Class: Normal  . Order #: [id number] Class: Print  . Order #: [id number] Class: Normal  . Order #: [id number] Class: Historical Med  . Order #: [id number] Class: Historical Med  . Order #: [id number] Class: Historical Med    Allergies Chantix [varenicline]; Ciprofloxacin; Nortriptyline; Topamax [topiramate]; and Wellbutrin [bupropion]  Family History  Problem Relation Age of Onset  . Hypertension Mother   . Other Mother     abnormal cells; had hyst  . Obesity Daughter   . Obesity Son   . Cancer Maternal Grandmother   . Osteoporosis Maternal Grandmother   . Cancer Maternal Grandfather   . Obesity Daughter   . Diabetes Father   . Hypertension Father   . Alcohol abuse Father     Social History Social History  Substance Use Topics  . Smoking status: Current Every Day Smoker    Packs/day: 1.00    Years: 0.00    Types: Cigarettes  . Smokeless tobacco: Never Used  . Alcohol use No    Review of Systems  Constitutional: No fever/chills; positive lightheadedness.  Eyes: No visual changes. ENT: No sore throat. Cardiovascular: Denies chest pain. Respiratory: Denies shortness of breath. Gastrointestinal: Positive diffuse abdominal pain.  No nausea, no vomiting.  Positive black diarrhea.  No constipation. Genitourinary: Negative for dysuria. Musculoskeletal: Negative for back pain. Skin: Negative for rash. Neurological: Negative for headaches, focal weakness or numbness.  10-point ROS otherwise negative.  ____________________________________________   PHYSICAL EXAM:  VITAL SIGNS: ED Triage Vitals [05/24/16 1628]  Enc Vitals  Group     BP 131/76     Pulse Rate (!) 58     Resp 14     Temp 98.4 F (36.9 C)     Temp Source Temporal     SpO2 100 %     Weight 180 lb (81.6 kg)     Height  (1.575 m)     Pain Score 8   Constitutional: Alert and oriented. Well appearing and in no acute distress. Eyes: Conjunctivae are normal. PERRL. EOMI. Head:  Atraumatic. Nose: No congestion/rhinnorhea. Mouth/Throat: Mucous membranes are moist.  Oropharynx non-erythematous. Neck: No stridor.   Cardiovascular: Normal rate, regular rhythm. Good peripheral circulation. Grossly normal heart sounds.   Respiratory: Normal respiratory effort.  No retractions. Lungs CTAB. Gastrointestinal: Soft with mild distension and diffuse tenderness. No rebound or guarding. Rectal exam with no obvious masses. Scant brown stool. No gross blood or melena.  Musculoskeletal: No lower extremity tenderness nor edema. No gross deformities of extremities. Neurologic:  Normal speech and language. No gross focal neurologic deficits are appreciated.  Skin:  Skin is warm, dry and intact. No rash noted. Psychiatric: Mood and affect are normal. Speech and behavior are normal.  ____________________________________________   LABS (all labs ordered are listed, but only abnormal results are displayed)  Labs Reviewed  COMPREHENSIVE METABOLIC PANEL - Abnormal; Notable for the following:       Result Value   AST 14 (*)    Anion gap 3 (*)    All other components within normal limits  CBC  URINALYSIS, ROUTINE W REFLEX MICROSCOPIC (NOT AT [facility])  LIPASE, BLOOD  POC OCCULT BLOOD, ED   ____________________________________________  EKG  None ____________________________________________  RADIOLOGY  None ____________________________________________   PROCEDURES  Procedure(s) performed:   Procedures  None ____________________________________________   INITIAL IMPRESSION / ASSESSMENT AND PLAN / ED COURSE  Pertinent labs & imaging results that were available during my care of the patient were reviewed by me and considered in my medical decision making (see chart for details).  Patient presents to the emergency department for evaluation of diffuse abdominal pain, diarrhea, black stools, and lightheadedness. She is chronic NSAID use or recent started meloxicam is a new  medication for fibromyalgia. She has a fused mild tenderness on exam. Rectal exam with no gross blood or melena. Plan to place IV, give IV fluids, follow lab work, and Hemoccult.   06:28 PM Labs are unremarkable. Patient Hemoccult negative. She is feeling much improved after medications. Abdomen remains benign. No indication for further imaging. Suspect enteritis. Discussed return precautions in detail with the patient who will follow with her primary care physician in the coming days or return to the emergency department if symptoms change or worsen suddenly.  ____________________________________________  FINAL CLINICAL IMPRESSION(S) / ED DIAGNOSES  Final diagnoses:  Generalized abdominal pain  Diarrhea, unspecified type     MEDICATIONS GIVEN DURING THIS VISIT:  Medications  sodium chloride 0.9 % bolus 1,000 mL (1,000 mLs Intravenous New Bag/Given 05/24/16 1748)  morphine 4 MG/ML injection 4 mg (4 mg Intravenous Given 05/24/16 1750)     NEW OUTPATIENT MEDICATIONS STARTED DURING THIS VISIT:  New Prescriptions   DICYCLOMINE (BENTYL) 20 MG TABLET    Take 1 tablet (20 mg total) by mouth 2 (two) times daily.   ONDANSETRON (ZOFRAN) 4 MG TABLET    Take 1 tablet (4 mg total) by mouth every 6 (six) hours.      Note:  This document was prepared using Dragon voice  recognition software and may include unintentional dictation errors.  Alona Bene, MD Emergency Medicine   Maia Plan, MD 05/24/16 [phone]

## 2016-05-24 NOTE — Telephone Encounter (Addendum)
Pt called sts for the past 2 days she can not eat or drink anything except milk. She is still having stomach pain with the meloxicam, she said her stools are like black tar. Pt had gall bladder removed around 2010. Please call

## 2016-05-24 NOTE — Discharge Instructions (Signed)

## 2016-05-24 NOTE — ED Notes (Signed)
Patient verbalizes understanding of discharge instructions, prescriptions, home care and follow up care. Patient out of department at this time with family. 

## 2016-05-25 NOTE — Telephone Encounter (Signed)
Patient called to advise, she "stopped Meloxicam on Saturday, went to ER last night, enlarged in stomach area but no bleeding, states pain is getting unreal, scared to take Ibuprofen. It's almost like charley horse all over her body, intense muscle spasms, muscle relaxer is not helping". Please call to advise.Marland Kitchen

## 2016-05-25 NOTE — Telephone Encounter (Signed)
I called the patient again. The patient is to stay off of all nostril anti-inflammatory medications. She may take Tylenol. She can continue to take the Lyrica. She reports some increased fibromyalgia pain. The patient is to be evaluated this week by her primary care physician. She may take Prilosec.

## 2016-05-26 ENCOUNTER — Other Ambulatory Visit (HOSPITAL_COMMUNITY): Payer: Self-pay | Admitting: Registered Nurse

## 2016-05-26 DIAGNOSIS — K921 Melena: Secondary | ICD-10-CM

## 2016-05-27 ENCOUNTER — Ambulatory Visit (HOSPITAL_COMMUNITY)
Admission: RE | Admit: 2016-05-27 | Discharge: 2016-05-27 | Disposition: A | Discharge status: Home / Self Care | Payer: Medicaid Other | Source: Ambulatory Visit | Attending: Registered Nurse | Admitting: Registered Nurse

## 2016-05-27 DIAGNOSIS — K921 Melena: Secondary | ICD-10-CM | POA: Diagnosis present

## 2016-05-27 DIAGNOSIS — R933 Abnormal findings on diagnostic imaging of other parts of digestive tract: Secondary | ICD-10-CM | POA: Diagnosis not present

## 2016-05-27 DIAGNOSIS — R109 Unspecified abdominal pain: Secondary | ICD-10-CM | POA: Insufficient documentation

## 2016-05-27 MED ORDER — TAPENTADOL HCL 100 MG PO TABS: 100.0000 mg | ORAL_TABLET | Freq: Four times a day (QID) | ORAL | 0 refills | Status: DC | PRN

## 2016-05-27 MED ORDER — PREGABALIN 50 MG PO CAPS: 50.0000 mg | ORAL_CAPSULE | Freq: Two times a day (BID) | ORAL | 1 refills | Status: DC

## 2016-05-27 NOTE — Telephone Encounter (Signed)
I called patient. The patient claims that her fibromyalgia pains got much worse. The patient is on their the taking only the 200 mg tablet at nighttime, taking it during the day causes tremors. I will add a 50 mg capsule taking one twice during the day. The patient indicates that the Ultram is not effective, I'll switch her to Nucynta. The patient claims that she will lose her health insurance in the next 3 days. She indicates that she had a CT scan of the abdomen, there shows a blockage of the bowels, they're trying to connect her with a gastroenterologist. She will stop the Ultram when she goes on the Nucynta. The patient reports constipation, she will go on MiraLAX.

## 2016-05-27 NOTE — Telephone Encounter (Signed)
Patient called, states she spoke with Dr. Anne Hahn on Tuesday, was advised to see PCP, states all she could see was NP, NP didn't feel comfortable writing any prescriptions, states she was there over 2 hours, NP was in and out of room 20 times, was advised to contact Dr. Anne Hahn, states she even took drug test for them and passed drug test, states insurance runs out Monday, NP was going to refer to pain clinic, "that takes 30 days", "I have a child in college, I'm a single mother, I don't know how I'm going to afford this". States "she doesn't think they realize just how bad this pain is". Please call to advise [phone].

## 2016-05-27 NOTE — Addendum Note (Signed)
Addended by: Stephanie Acre on: 05/27/2016 05:05 PM   Modules accepted: Orders

## 2016-05-27 NOTE — Telephone Encounter (Signed)
Scripts printed, signed and faxed to pharmacy.

## 2016-05-28 ENCOUNTER — Emergency Department (HOSPITAL_COMMUNITY): Payer: Medicaid Other

## 2016-05-28 ENCOUNTER — Encounter (HOSPITAL_COMMUNITY): Payer: Self-pay | Admitting: *Deleted

## 2016-05-28 ENCOUNTER — Emergency Department (HOSPITAL_COMMUNITY)
Admission: EM | Admit: 2016-05-28 | Discharge: 2016-05-28 | Disposition: A | Discharge status: Home / Self Care | Payer: Medicaid Other | Attending: Emergency Medicine | Admitting: Emergency Medicine

## 2016-05-28 ENCOUNTER — Telehealth: Payer: Self-pay | Admitting: Neurology

## 2016-05-28 DIAGNOSIS — R112 Nausea with vomiting, unspecified: Secondary | ICD-10-CM

## 2016-05-28 DIAGNOSIS — Z79899 Other long term (current) drug therapy: Secondary | ICD-10-CM | POA: Insufficient documentation

## 2016-05-28 DIAGNOSIS — F17210 Nicotine dependence, cigarettes, uncomplicated: Secondary | ICD-10-CM | POA: Insufficient documentation

## 2016-05-28 DIAGNOSIS — R1012 Left upper quadrant pain: Secondary | ICD-10-CM | POA: Insufficient documentation

## 2016-05-28 DIAGNOSIS — R109 Unspecified abdominal pain: Secondary | ICD-10-CM

## 2016-05-28 LAB — URINALYSIS, ROUTINE W REFLEX MICROSCOPIC
Bilirubin Urine: NEGATIVE
GLUCOSE, UA: NEGATIVE mg/dL
HGB URINE DIPSTICK: NEGATIVE
KETONES UR: NEGATIVE mg/dL
LEUKOCYTES UA: NEGATIVE
Nitrite: NEGATIVE
PH: 7 (ref 5.0–8.0)
PROTEIN: NEGATIVE mg/dL
Specific Gravity, Urine: 1.01 (ref 1.005–1.030)

## 2016-05-28 LAB — PREGNANCY, URINE: Preg Test, Ur: NEGATIVE

## 2016-05-28 LAB — COMPREHENSIVE METABOLIC PANEL
ALT: 12 U/L — ABNORMAL LOW (ref 14–54)
AST: 11 U/L — ABNORMAL LOW (ref 15–41)
Albumin: 3.8 g/dL (ref 3.5–5.0)
Alkaline Phosphatase: 63 U/L (ref 38–126)
Anion gap: 4 — ABNORMAL LOW (ref 5–15)
BUN: 8 mg/dL (ref 6–20)
CO2: 29 mmol/L (ref 22–32)
Calcium: 8.8 mg/dL — ABNORMAL LOW (ref 8.9–10.3)
Chloride: 105 mmol/L (ref 101–111)
Creatinine, Ser: 0.73 mg/dL (ref 0.44–1.00)
GFR calc Af Amer: >60 mL/min (ref >60)
GFR calc non Af Amer: >60 mL/min (ref >60)
Glucose, Bld: 89 mg/dL (ref 65–99)
Potassium: 4.2 mmol/L (ref 3.5–5.1)
Sodium: 138 mmol/L (ref 135–145)
Total Bilirubin: 0.4 mg/dL (ref 0.3–1.2)
Total Protein: 6.9 g/dL (ref 6.5–8.1)

## 2016-05-28 LAB — CBC
HEMATOCRIT: 37.7 % (ref 36.0–46.0)
Hemoglobin: 12.5 g/dL (ref 12.0–15.0)
MCH: 31.5 pg (ref 26.0–34.0)
MCHC: 33.2 g/dL (ref 30.0–36.0)
MCV: 95 fL (ref 78.0–100.0)
PLATELETS: 184 10*3/uL (ref 150–400)
RBC: 3.97 MIL/uL (ref 3.87–5.11)
RDW: 13.3 % (ref 11.5–15.5)
WBC: 6.3 10*3/uL (ref 4.0–10.5)

## 2016-05-28 LAB — LIPASE, BLOOD: Lipase: 12 U/L (ref 11–51)

## 2016-05-28 MED ORDER — DICYCLOMINE HCL 10 MG/ML IM SOLN
20.0000 mg | Freq: Once | INTRAMUSCULAR | Status: AC
  Administered 2016-05-28: 20 mg via INTRAMUSCULAR
  Filled 2016-05-28: qty 2

## 2016-05-28 MED ORDER — ONDANSETRON HCL 4 MG/2ML IJ SOLN
4.0000 mg | INTRAMUSCULAR | Status: DC | PRN
  Administered 2016-05-28: 4 mg via INTRAVENOUS
  Filled 2016-05-28: qty 2

## 2016-05-28 MED ORDER — MORPHINE SULFATE (PF) 4 MG/ML IV SOLN
4.0000 mg | INTRAVENOUS | Status: DC | PRN
  Administered 2016-05-28: 4 mg via INTRAVENOUS
  Filled 2016-05-28: qty 1

## 2016-05-28 MED ORDER — FAMOTIDINE IN NACL 20-0.9 MG/50ML-% IV SOLN
20.0000 mg | Freq: Once | INTRAVENOUS | Status: AC
  Administered 2016-05-28: 20 mg via INTRAVENOUS
  Filled 2016-05-28: qty 50

## 2016-05-28 MED ORDER — SODIUM CHLORIDE 0.9 % IV BOLUS (SEPSIS)
1000.0000 mL | Freq: Once | INTRAVENOUS | Status: AC
  Administered 2016-05-28: 1000 mL via INTRAVENOUS

## 2016-05-28 NOTE — ED Notes (Signed)
Pt made aware to return if symptoms worsen or if any life threatening symptoms occur.   

## 2016-05-28 NOTE — ED Notes (Signed)
Pt was able to drink water without any c/o nausea or vomiting.

## 2016-05-28 NOTE — Telephone Encounter (Signed)
I called the patient. She did not get the Rx for Nucynta and Lyrica today, The pharmacy had a power outage. I will need to try to refax the Rx this weekend.

## 2016-05-28 NOTE — ED Provider Notes (Signed)
[facility]-EMERGENCY DEPT Provider Note   CSN: [id number] Arrival date & time: 05/28/16  1026  First Provider Contact:  None       History   Chief Complaint Chief Complaint  Patient presents with  . Abdominal Pain    HPI Mary Parker is a 35 y.o. female.   Abdominal Pain      Pt was seen at 1215.  Per pt, c/o gradual onset and persistence of constant upper abd "pain" for the past 5 to 6 days.  Has been associated with multiple intermittent episodes of N/V.  Describes the abd pain as "cramping." Pt was evaluated in the ED 4 days ago for her symptoms, states she "didn't get any prescriptions." Pt was evaluated by her PMD today, had an outpatient CT "that showed something was narrowed" and pt was sent to the ED for further evaluation.  Denies diarrhea, no fevers, no back pain, no rash, no CP/SOB, no black or blood in stools or emesis.      Past Medical History:  Diagnosis Date  . Anxiety   . Arthritis    both knees  . Body aches 12/11/2014  . Chronic insomnia   . Chronic low back pain 05/29/2015  . Common migraine 12/19/2014  . Depression 12/11/2014  . Fibrocystic disease of both breasts   . Fibromyalgia 12/19/2014  . IBS (irritable bowel syndrome)   . Migraines   . Rebound headache 12/19/2014  . Sciatica of left side   . Seizures (HCC)   . Vitamin D deficiency     Patient Active Problem List   Diagnosis Date Noted  . Chronic low back pain 05/29/2015  . Common migraine 12/19/2014  . Fibromyalgia 12/19/2014  . Rebound headache 12/19/2014  . Depression 12/11/2014  . Body aches 12/11/2014    Past Surgical History:  Procedure Laterality Date  . CESAREAN SECTION  [phone]  . CHOLECYSTECTOMY  2001  . ENDOMETRIAL ABLATION    . TUBAL LIGATION      OB History    Gravida Para Term Preterm AB Living   3 3       3    SAB TAB Ectopic Multiple Live Births                   Home Medications    Prior to Admission medications   Medication Sig Start Date End Date  Taking? Authorizing Provider  ALPRAZolam Prudy Feeler) 0.5 MG tablet Take 0.5 mg by mouth 4 (four) times daily.  11/18/14   Historical Provider, MD  butalbital-acetaminophen-caffeine (FIORICET, ESGIC) 50-325-40 MG per tablet Take 1 tablet by mouth every 4 (four) hours as needed for migraine.     Historical Provider, MD  cyanocobalamin (,VITAMIN B-12,) 1000 MCG/ML injection Inject 1,000 mcg into the muscle every 30 (thirty) days.  03/07/15   Historical Provider, MD  dicyclomine (BENTYL) 20 MG tablet Take 1 tablet (20 mg total) by mouth 2 (two) times daily. 05/24/16   Maia Plan, MD  ondansetron (ZOFRAN) 4 MG tablet Take 1 tablet (4 mg total) by mouth every 6 (six) hours. 05/24/16   Maia Plan, MD  pregabalin (LYRICA) 200 MG capsule Take 1 capsule (200 mg total) by mouth 3 (three) times daily. Patient taking differently: Take 200 mg by mouth at bedtime.  07/30/15   York Spaniel, MD  pregabalin (LYRICA) 50 MG capsule Take 1 capsule (50 mg total) by mouth 2 (two) times daily. 05/27/16   York Spaniel, MD  Tapentadol HCl (NUCYNTA)  100 MG TABS Take 1 tablet (100 mg total) by mouth every 6 (six) hours as needed. 05/27/16   York Spaniel, MD  tiZANidine (ZANAFLEX) 2 MG tablet One tablet twice during the day and 2 at night 05/06/16   York Spaniel, MD  UNABLE TO FIND Med Name: Compound Cream w/ lidocaine.    Historical Provider, MD  Vitamin D, Ergocalciferol, (DRISDOL) 50000 UNITS CAPS capsule Take 50,000 Units by mouth every 30 (thirty) days. Last dose was on 03-17-15 11/20/14   Historical Provider, MD    Family History Family History  Problem Relation Age of Onset  . Hypertension Mother   . Other Mother     abnormal cells; had hyst  . Obesity Daughter   . Obesity Son   . Cancer Maternal Grandmother   . Osteoporosis Maternal Grandmother   . Cancer Maternal Grandfather   . Obesity Daughter   . Diabetes Father   . Hypertension Father   . Alcohol abuse Father     Social History Social History    Substance Use Topics  . Smoking status: Current Every Day Smoker    Packs/day: 1.00    Years: 0.00    Types: Cigarettes  . Smokeless tobacco: Never Used  . Alcohol use No     Allergies   Chantix [varenicline]; Ciprofloxacin; Meloxicam; Nortriptyline; Topamax [topiramate]; and Wellbutrin [bupropion]   Review of Systems Review of Systems  Gastrointestinal: Positive for abdominal pain.   ROS: Statement: All systems negative except as marked or noted in the HPI; Constitutional: Negative for fever and chills. ; ; Eyes: Negative for eye pain, redness and discharge. ; ; ENMT: Negative for ear pain, hoarseness, nasal congestion, sinus pressure and sore throat. ; ; Cardiovascular: Negative for chest pain, palpitations, diaphoresis, dyspnea and peripheral edema. ; ; Respiratory: Negative for cough, wheezing and stridor. ; ; Gastrointestinal: +N/V, abd pain. Negative for diarrhea, blood in stool, hematemesis, jaundice and rectal bleeding. . ; ; Genitourinary: Negative for dysuria, flank pain and hematuria. ; ; Musculoskeletal: Negative for back pain and neck pain. Negative for swelling and trauma.; ; Skin: Negative for pruritus, rash, abrasions, blisters, bruising and skin lesion.; ; Neuro: Negative for headache, lightheadedness and neck stiffness. Negative for weakness, altered level of consciousness, altered mental status, extremity weakness, paresthesias, involuntary movement, seizure and syncope.       Physical Exam Updated Vital Signs BP 113/82 (BP Location: Left Arm)   Pulse 71   Temp 98.3 F (36.8 C) (Oral)   Resp 16   Ht  (1.6 m)   Wt 180 lb (81.6 kg)   SpO2 97%   BMI 31.89 kg/m   Physical Exam 1220: Physical examination:  Nursing notes reviewed; Vital signs and O2 SAT reviewed;  Constitutional: Well developed, Well nourished, Well hydrated, In no acute distress; Head:  Normocephalic, atraumatic; Eyes: EOMI, PERRL, No scleral icterus; ENMT: Mouth and pharynx normal, Mucous  membranes moist; Neck: Supple, Full range of motion, No lymphadenopathy; Cardiovascular: Regular rate and rhythm, No murmur, rub, or gallop; Respiratory: Breath sounds clear & equal bilaterally, No rales, rhonchi, wheezes.  Speaking full sentences with ease, Normal respiratory effort/excursion; Chest: Nontender, Movement normal; Abdomen: Soft, +LUQ tenderness to palp. No rebound or guarding. Nondistended, Normal bowel sounds; Genitourinary: No CVA tenderness; Extremities: Pulses normal, No tenderness, No edema, No calf edema or asymmetry.; Neuro: AA&Ox3, Major CN grossly intact.  Speech clear. No gross focal motor or sensory deficits in extremities.; Skin: Color normal, Warm, Dry.; Psych:  Affect flat, poor eye contact.    ED Treatments / Results  Labs (all labs ordered are listed, but only abnormal results are displayed)   EKG  EKG Interpretation None       Radiology   Procedures Procedures (including critical care time)  Medications Ordered in ED Medications  sodium chloride 0.9 % bolus 1,000 mL (not administered)  famotidine (PEPCID) IVPB 20 mg premix (not administered)  ondansetron (ZOFRAN) injection 4 mg (not administered)  dicyclomine (BENTYL) injection 20 mg (not administered)     Initial Impression / Assessment and Plan / ED Course  I have reviewed the triage vital signs and the nursing notes.  Pertinent labs & imaging results that were available during my care of the patient were reviewed by me and considered in my medical decision making (see chart for details).  MDM Reviewed: previous chart, nursing note and vitals Reviewed previous: labs and CT scan Interpretation: x-ray and labs    Results for orders placed or performed during the hospital encounter of 05/28/16  Lipase, blood  Result Value Ref Range   Lipase 12 11 - 51 U/L  Comprehensive metabolic panel  Result Value Ref Range   Sodium 138 135 - 145 mmol/L   Potassium 4.2 3.5 - 5.1 mmol/L   Chloride 105  101 - 111 mmol/L   CO2 29 22 - 32 mmol/L   Glucose, Bld 89 65 - 99 mg/dL   BUN 8 6 - 20 mg/dL   Creatinine, Ser 2.48 0.44 - 1.00 mg/dL   Calcium 8.8 (L) 8.9 - 10.3 mg/dL   Total Protein 6.9 6.5 - 8.1 g/dL   Albumin 3.8 3.5 - 5.0 g/dL   AST 11 (L) 15 - 41 U/L   ALT 12 (L) 14 - 54 U/L   Alkaline Phosphatase 63 38 - 126 U/L   Total Bilirubin 0.4 0.3 - 1.2 mg/dL   GFR calc non Af Amer >60 >60 mL/min   GFR calc Af Amer >60 >60 mL/min   Anion gap 4 (L) 5 - 15  CBC  Result Value Ref Range   WBC 6.3 4.0 - 10.5 K/uL   RBC 3.97 3.87 - 5.11 MIL/uL   Hemoglobin 12.5 12.0 - 15.0 g/dL   HCT 25.0 03.7 - 04.8 %   MCV 95.0 78.0 - 100.0 fL   MCH 31.5 26.0 - 34.0 pg   MCHC 33.2 30.0 - 36.0 g/dL   RDW 88.9 16.9 - 45.0 %   Platelets 184 150 - 400 K/uL  Urinalysis, Routine w reflex microscopic  Result Value Ref Range   Color, Urine YELLOW YELLOW   APPearance CLEAR CLEAR   Specific Gravity, Urine 1.010 1.005 - 1.030   pH 7.0 5.0 - 8.0   Glucose, UA NEGATIVE NEGATIVE mg/dL   Hgb urine dipstick NEGATIVE NEGATIVE   Bilirubin Urine NEGATIVE NEGATIVE   Ketones, ur NEGATIVE NEGATIVE mg/dL   Protein, ur NEGATIVE NEGATIVE mg/dL   Nitrite NEGATIVE NEGATIVE   Leukocytes, UA NEGATIVE NEGATIVE  Pregnancy, urine  Result Value Ref Range   Preg Test, Ur NEGATIVE NEGATIVE   Ct Abdomen Pelvis Wo Contrast Result Date: 05/27/2016 CLINICAL DATA:  Left-sided abdomen pain, bloody stool EXAM: CT ABDOMEN AND PELVIS WITHOUT CONTRAST TECHNIQUE: Multidetector CT imaging of the abdomen and pelvis was performed following the standard protocol without IV contrast. COMPARISON:  None. FINDINGS: The lung bases are clear. The liver is unremarkable in the unenhanced state. Surgical clips are present from prior cholecystectomy. The pancreas is normal in  size in the pancreatic duct is not dilated. The adrenal glands and spleen are unremarkable. The stomach is decompressed. No renal calculi are seen and there is no evidence of  hydronephrosis. The abdominal aorta is normal in caliber. No adenopathy is seen. The urinary bladder is unremarkable and moderately well distended. The uterus is normal in size. No adnexal lesion is seen. No fluid is noted within the pelvis. The colon contains contrast and feces but it is not well distended. The only questionable abnormality in the colon is within the mid transverse colon where there is an area of circumferential narrowing. This may simply be due to peristalsis, but a mid transverse colon lesion cannot be excluded. The terminal ileum is well opacified and is unremarkable, and the appendix appears normal. The lumbar vertebrae are in normal alignment with normal intervertebral disc spaces. IMPRESSION: 1. No definite explanation for the patient's abdominal pain and bloody stool is seen. 2. The only questionable abnormality of the colon is an area of narrowing of the mid transverse colon which could simply be due to peristalsis, but a mid transverse colon lesion cannot be excluded. Correlate clinically. 3. No renal or ureteral calculi. 4. The terminal ileum and the appendix are unremarkable. Electronically Signed   By: Dwyane Dee M.D.   On: 05/27/2016 13:47  Dg Abd Acute W/chest Result Date: 05/28/2016 CLINICAL DATA:  Abdominal pain, nausea, vomiting. Pain primarily left-sided. EXAM: DG ABDOMEN ACUTE W/ 1V CHEST COMPARISON:  CT 05/27/2016 FINDINGS: Oral contrast material noted within the colon. Nonobstructive bowel gas pattern. No free air organomegaly. Prior cholecystectomy. Heart and mediastinal contours are within normal limits. No focal opacities or effusions. No acute bony abnormality. IMPRESSION: Negative abdominal radiographs.  No acute cardiopulmonary disease. Electronically Signed   By: Charlett Nose M.D.   On: 05/28/2016 14:36    1300:  Pt insistent she was sent to the ED to see a GI doctor and have a colonoscopy. Explained to pt and family that this is not an emergent procedure, and  can be obtained as an outpatient. T/C to GI Dr. Darrick Penna, case discussed, including:  HPI, pertinent PM/SHx, VS/PE, dx testing, ED course and treatment:  Agrees this is an outpatient procedure, have pt call office to been seen next week and schedule outpatient colonoscopy. Pt and family informed of same; verb understanding.   1600:  Pt has tol PO well while in the ED without N/V.  No stooling while in the ED.  Abd benign, VSS. Feels better and wants to go home now. Cautioned to avoid NSAIDs and ASA products, and to fill her rx from her previous ED visit. Pt verb understanding. Dx and testing d/w pt and family.  Questions answered.  Verb understanding, agreeable to d/c home with outpt f/u.           Final Clinical Impressions(s) / ED Diagnoses   Final diagnoses:  None    New Prescriptions New Prescriptions   No medications on file     Samuel Jester, DO 06/01/16 0003

## 2016-05-28 NOTE — ED Triage Notes (Signed)
Pt comes in for generalized abdominal pain. Pt states this pain started 7 days ago. Pt went to her Dr. Who set up an out pt CT. They called her today to tell her she had a narrowing in her intestines that she should come in.   Last BM was on Sunday. Pt began vomiting this morning.

## 2016-05-28 NOTE — Discharge Instructions (Signed)
Eat a bland diet, avoiding greasy, fatty, fried foods, as well as spicy and acidic foods or beverages.  Avoid eating within the hour or 2 before going to bed or laying down.  Also avoid teas, colas, coffee, chocolate, pepermint and spearment.  Take over the counter pepcid, one or two tablets by mouth twice a day, for the next 3 to 4 weeks.  May also take over the counter maalox/mylanta, as directed on packaging, as needed for discomfort.  Take your previous ED prescriptions as directed. Your CT scan showed a small area of narrowing in the mid-transverse colon.  Call your regular medical doctor tomorrow to schedule a follow up appointment this week. Call the GI doctor tomorrow to schedule a follow up appointment within in the next week.  Return to the Emergency Department immediately if worsening.

## 2016-05-29 ENCOUNTER — Other Ambulatory Visit: Payer: Self-pay | Admitting: Neurology

## 2016-05-29 MED ORDER — TAPENTADOL HCL 100 MG PO TABS: 100.0000 mg | ORAL_TABLET | Freq: Four times a day (QID) | ORAL | 0 refills | Status: DC | PRN

## 2016-05-29 MED ORDER — PREGABALIN 50 MG PO CAPS: 50.0000 mg | ORAL_CAPSULE | Freq: Two times a day (BID) | ORAL | 1 refills | Status: DC

## 2016-05-31 ENCOUNTER — Ambulatory Visit (HOSPITAL_COMMUNITY): Payer: Medicaid Other

## 2016-05-31 NOTE — Telephone Encounter (Signed)
Pt came to the office this morning asking for a paper copy of her nucynta RX and her lyrica RX. She says that the pharmacy told her that they do no accept faxed copies of either prescription. I found the paper copy at [employer]  She is taking the RX to [facility] in [place] and she thinks that the lyrica may need a prior [place]. However, her insurance also runs out today so needs the prior auth done ASAP. I advised her to take both RXs right away to the [facility] in [place] so they can determine if a pa is needed so we may start working on it for her. Pt verbalized understanding.

## 2016-05-31 NOTE — Telephone Encounter (Signed)
PQ#[id number] initiated and is pending

## 2016-06-01 ENCOUNTER — Telehealth: Payer: Self-pay

## 2016-06-01 ENCOUNTER — Encounter: Payer: Self-pay | Admitting: Gastroenterology

## 2016-06-01 NOTE — Telephone Encounter (Signed)
PA approved for Lyrica 50 mg from 05/31/16 through 05/26/17. [facility] pharmacy notified via TC.

## 2016-06-01 NOTE — Telephone Encounter (Signed)
YQ#[id number] approved 05/31/16 to 05/26/17 by [employer]

## 2016-06-07 ENCOUNTER — Ambulatory Visit: Payer: Medicaid Other | Admitting: Gastroenterology

## 2016-06-23 IMAGING — MR MR [PERSON_NAME] HEAD
1 series · 48 of 48 positions shown · non-contrast
Comparison: CT head 03/21/2015, MRI head 01/08/2015

CLINICAL DATA: Left-sided numbness. History of seizure. Left-sided
headache.

EXAM:
MRI HEAD WITHOUT CONTRAST
TECHNIQUE: Multiplanar, multiecho pulse sequences of the brain and surrounding
structures were obtained without intravenous contrast.

[Series 3: tof_2d_veins · sagittal · 3.0mm · 0.78mm/px · 48 of 75 slices shown]
[im 1/75]
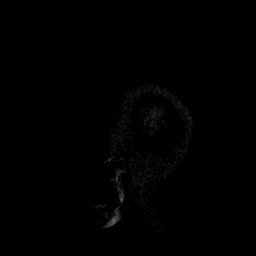
[im 2/75]
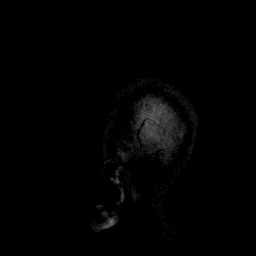
[im 4/75]
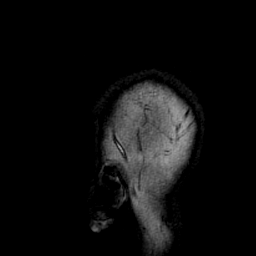
[im 5/75]
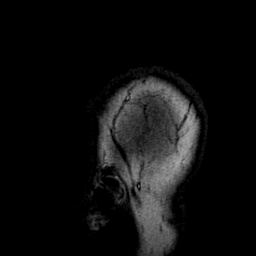
[im 7/75]
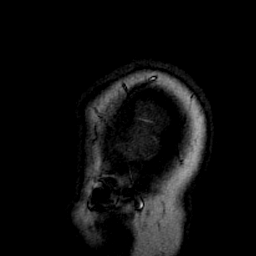
[im 8/75]
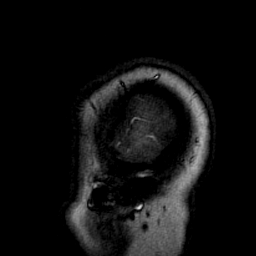
[im 10/75]
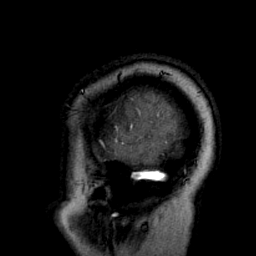
[im 12/75]
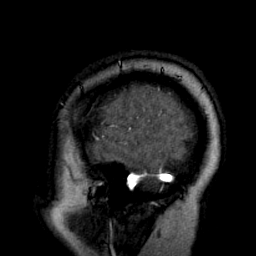
[im 13/75]
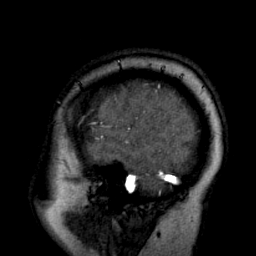
[im 15/75]
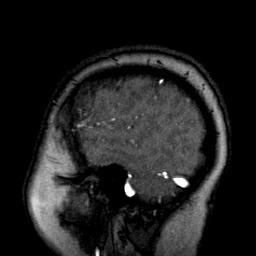
[im 16/75]
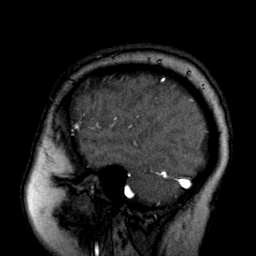
[im 18/75]
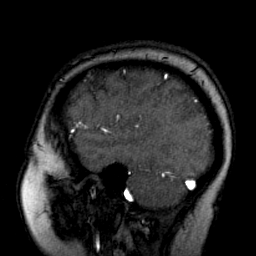
[im 19/75]
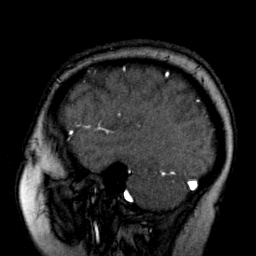
[im 21/75]
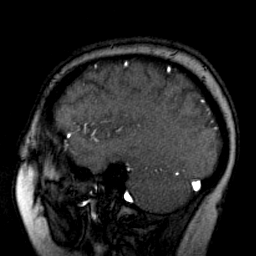
[im 23/75]
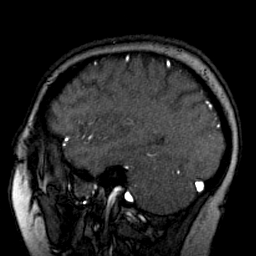
[im 24/75]
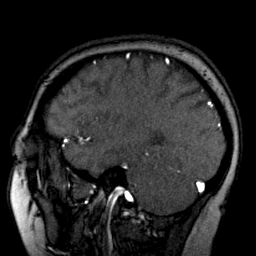
[im 26/75]
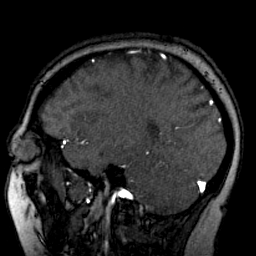
[im 27/75]
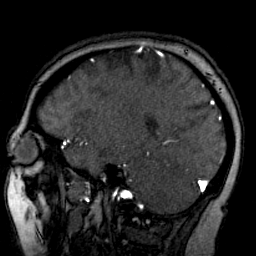
[im 29/75]
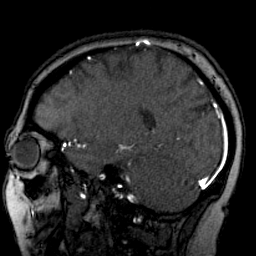
[im 30/75]
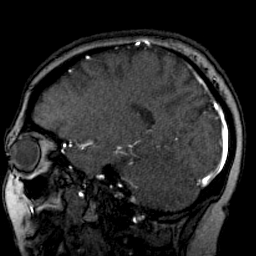
[im 32/75]
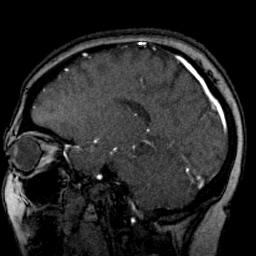
[im 34/75]
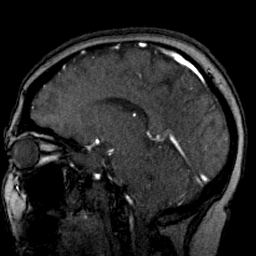
[im 35/75]
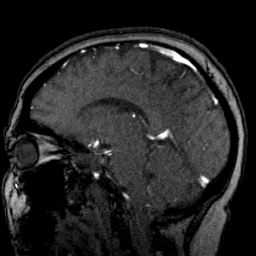
[im 37/75]
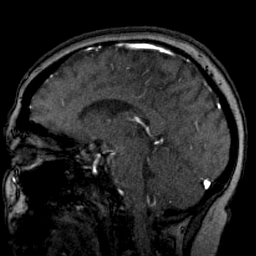
[im 38/75]
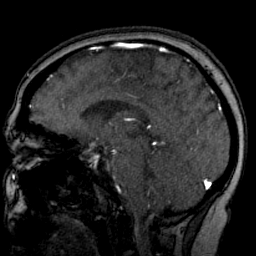
[im 40/75]
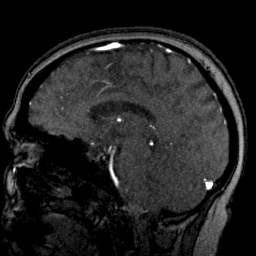
[im 41/75]
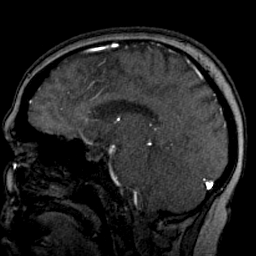
[im 43/75]
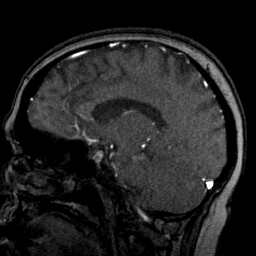
[im 45/75]
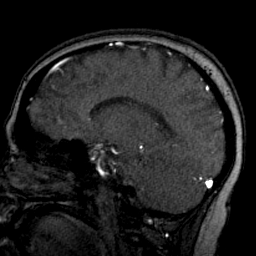
[im 46/75]
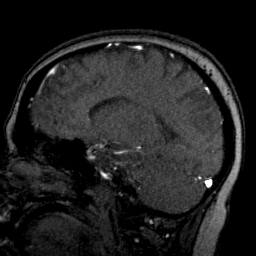
[im 48/75]
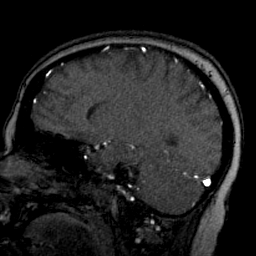
[im 49/75]
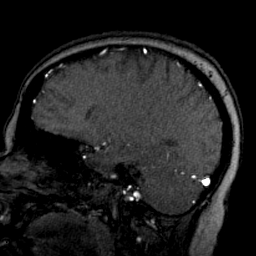
[im 51/75]
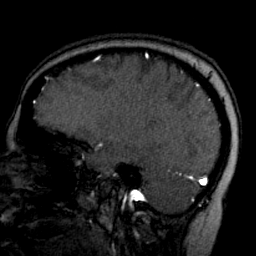
[im 52/75]
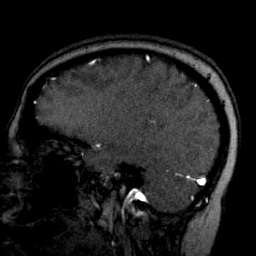
[im 54/75]
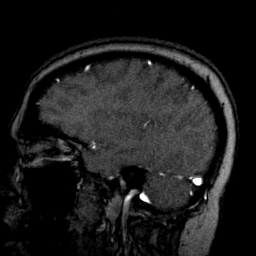
[im 56/75]
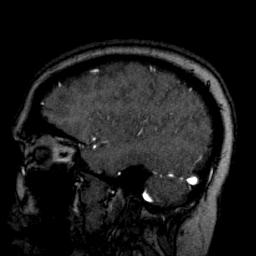
[im 57/75]
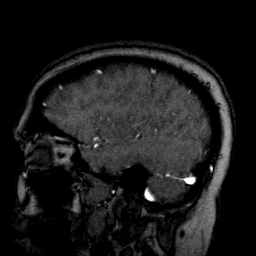
[im 59/75]
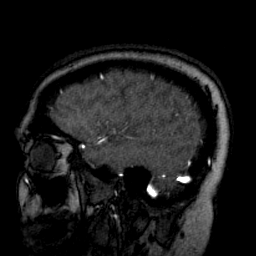
[im 60/75]
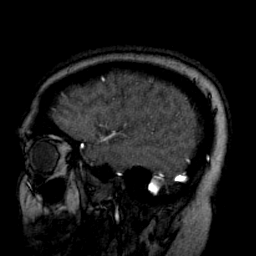
[im 62/75]
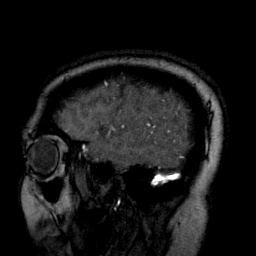
[im 63/75]
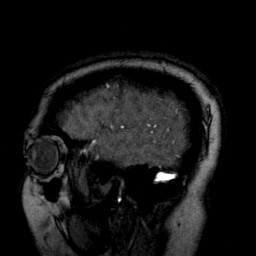
[im 65/75]
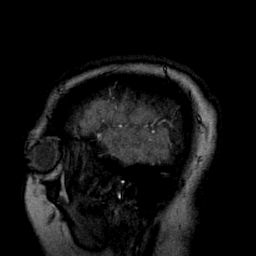
[im 67/75]
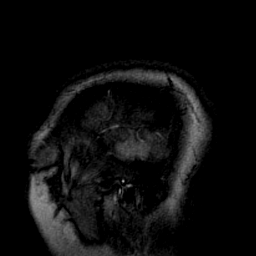
[im 68/75]
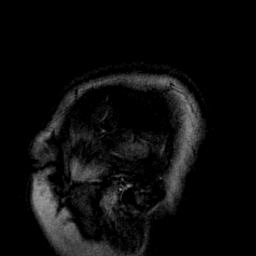
[im 70/75]
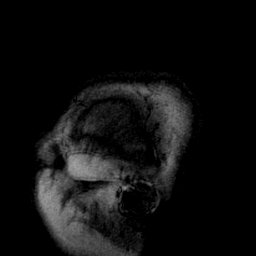
[im 71/75]
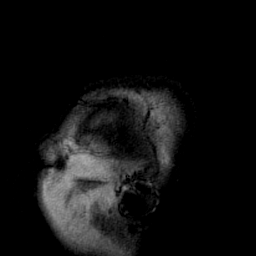
[im 73/75]
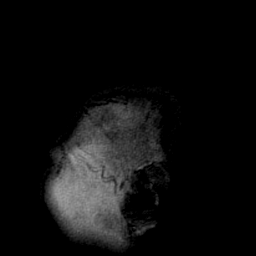
[im 75/75]
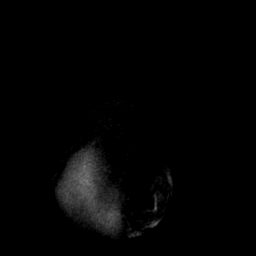

[48 of 48 positions shown; findings below may reference images not displayed]

FINDINGS: Ventricle size is normal.  Cerebral volume is normal

Pituitary normal in size. Cervical medullary junction normal.
Negative for Chiari malformation. Paranasal sinuses clear

Negative for acute or chronic ischemia

Negative for demyelinating disease. Cerebral white matter is normal.

Negative for hemorrhage or mass lesion

Medial temporal lobe is normal in signal and volume bilaterally.

MRV HEAD WITHOUT CONTRAST

Superior sagittal sinus widely patent. Transverse and sigmoid sinus
widely patent bilaterally. Straight sinus patent.
IMPRESSION: Normal MRI head without contrast

Normal MRV head

## 2016-10-14 ENCOUNTER — Encounter: Payer: Self-pay | Admitting: Adult Health

## 2016-10-14 ENCOUNTER — Ambulatory Visit (INDEPENDENT_AMBULATORY_CARE_PROVIDER_SITE_OTHER): Payer: 59 | Admitting: Adult Health

## 2016-10-14 VITALS — BP 110/80 | HR 78 | Ht 62.0 in | Wt 186.5 lb

## 2016-10-14 DIAGNOSIS — F329 Major depressive disorder, single episode, unspecified: Secondary | ICD-10-CM | POA: Diagnosis not present

## 2016-10-14 DIAGNOSIS — F419 Anxiety disorder, unspecified: Secondary | ICD-10-CM | POA: Diagnosis not present

## 2016-10-14 DIAGNOSIS — F32A Depression, unspecified: Secondary | ICD-10-CM

## 2016-10-14 MED ORDER — ALPRAZOLAM 0.5 MG PO TABS: 0.5000 mg | ORAL_TABLET | Freq: Two times a day (BID) | ORAL | 0 refills | Status: DC | PRN

## 2016-10-14 MED ORDER — ESCITALOPRAM OXALATE 10 MG PO TABS: 10.0000 mg | ORAL_TABLET | Freq: Every day | ORAL | 1 refills | Status: DC

## 2016-10-14 NOTE — Progress Notes (Signed)
Subjective:     Patient ID: Mary Parker, female   DOB: 03-Mar-1981, 35 y.o.   MRN: [id number]  HPI Mary Parker is a 35 year old white female, divorced in to teary, complaining of being depressed and has anxiety, can't breath at times and not sleeping.She had to go to court this week over ex husband, she has 50B on him and he is in jail but due to get out in May.She had been on meds but lost insurance and has not had meds in 6 months.She is working at [employer] office [place].  Review of Systems +depression +anxiety  Reviewed past medical,surgical, social and family history. Reviewed medications and allergies.     Objective:   Physical Exam BP 110/80 (BP Location: Left Arm, Patient Position: Sitting, Cuff Size: Normal)   Pulse 78   Ht 5\' 2"  (1.575 m)   Wt 186 lb 8 oz (84.6 kg)   BMI 34.11 kg/m  Skin warm and dry. Lungs: clear to ausculation bilaterally. Cardiovascular: regular rate and rhythm.   PHQ 9 score 25, denies any suicidal ideations. Will rx lexapro 10 mg for now, and give xanax for anxiety and discussed going to [facility] for counseling.She said she had counseling in past with [facility] She says she just wants to be happy and hopeful but can't because she has to worry about him and has to go to court every so often and relive last 20 years.If ever feels like going to hurt self go to ER and she agrees. Face time 15 minutes with 50 % counseling.  Assessment:     1. Depression, unspecified depression type   2. Anxiety       Plan:     Rx lexapro 10 mg #30 take 1 daily with 1 refill Rx xanax .05 mg #30 take 1 bid prn anxiety Follow up in 2 weeks

## 2016-10-14 NOTE — Patient Instructions (Signed)
Follow up with me  Start lexapro now  Use xanax if needed for anxiety

## 2016-10-27 ENCOUNTER — Ambulatory Visit: Payer: 59 | Admitting: Adult Health

## 2016-11-05 ENCOUNTER — Emergency Department (HOSPITAL_COMMUNITY)
Admission: EM | Admit: 2016-11-05 | Discharge: 2016-11-05 | Disposition: A | Discharge status: Home / Self Care | Payer: Medicaid Other | Attending: Dermatology | Admitting: Dermatology

## 2016-11-05 ENCOUNTER — Encounter (HOSPITAL_COMMUNITY): Payer: Self-pay | Admitting: Emergency Medicine

## 2016-11-05 DIAGNOSIS — Y939 Activity, unspecified: Secondary | ICD-10-CM | POA: Insufficient documentation

## 2016-11-05 DIAGNOSIS — Z5321 Procedure and treatment not carried out due to patient leaving prior to being seen by health care provider: Secondary | ICD-10-CM | POA: Insufficient documentation

## 2016-11-05 DIAGNOSIS — S0990XA Unspecified injury of head, initial encounter: Secondary | ICD-10-CM | POA: Insufficient documentation

## 2016-11-05 DIAGNOSIS — Y929 Unspecified place or not applicable: Secondary | ICD-10-CM | POA: Insufficient documentation

## 2016-11-05 DIAGNOSIS — Y999 Unspecified external cause status: Secondary | ICD-10-CM | POA: Insufficient documentation

## 2016-11-05 DIAGNOSIS — W228XXA Striking against or struck by other objects, initial encounter: Secondary | ICD-10-CM | POA: Insufficient documentation

## 2016-11-05 DIAGNOSIS — F17210 Nicotine dependence, cigarettes, uncomplicated: Secondary | ICD-10-CM | POA: Insufficient documentation

## 2016-11-05 NOTE — ED Notes (Signed)
No answer in waiting areas.  Registration clerk reports pt stated he was leaving
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
## 2016-11-08 ENCOUNTER — Encounter: Payer: Self-pay | Admitting: Adult Health

## 2016-11-08 ENCOUNTER — Ambulatory Visit: Payer: Medicaid Other | Admitting: Adult Health

## 2016-11-16 ENCOUNTER — Ambulatory Visit (INDEPENDENT_AMBULATORY_CARE_PROVIDER_SITE_OTHER): Payer: 59 | Admitting: Neurology

## 2016-11-16 ENCOUNTER — Encounter: Payer: Self-pay | Admitting: Neurology

## 2016-11-16 VITALS — BP 109/75 | HR 86 | Resp 16 | Ht 62.0 in | Wt 188.0 lb

## 2016-11-16 DIAGNOSIS — S060X9A Concussion with loss of consciousness of unspecified duration, initial encounter: Secondary | ICD-10-CM

## 2016-11-16 DIAGNOSIS — G43019 Migraine without aura, intractable, without status migrainosus: Secondary | ICD-10-CM

## 2016-11-16 DIAGNOSIS — M797 Fibromyalgia: Secondary | ICD-10-CM | POA: Diagnosis not present
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
## 2016-11-16 NOTE — Patient Instructions (Addendum)
Concussion, Adult A concussion, or closed-head injury, is a brain injury caused by a direct blow to the head or by a quick and sudden movement (jolt) of the head or neck. Concussions are usually not life-threatening. Even so, the effects of a concussion can be serious. If you have had a concussion before, you are more likely to experience concussion-like symptoms after a direct blow to the head. What are the causes? This condition is caused by:  Direct blow to the head, such as from running into another player during a soccer game, being hit in a fight, or hitting your head on a hard surface.  A jolt of the head or neck that causes the brain to move back and forth inside the skull, such as in a car crash. What are the signs or symptoms? The signs of a concussion can be hard to notice. Early on, they may be missed by you, family members, and health care providers. You may look fine but act or feel differently. Symptoms are usually temporary, but they may last for days, weeks, or even longer. Some symptoms may appear right away while others may not show up for hours or days. Every head injury is different. Symptoms may include:  Mild to moderate headaches that will not go away.  A feeling of pressure inside your head.  Having more trouble than usual:  Learning or remembering things you have heard.  Answering questions.  Paying attention or concentrating.  Organizing daily tasks.  Making decisions and solving problems.  Slowness in thinking, acting or reacting, speaking, or reading.  Getting lost or being easily confused.  Feeling tired all the time or lacking energy (fatigued).  Feeling drowsy.  Sleep disturbances.  Sleeping more than usual.  Sleeping less than usual.  Trouble falling asleep.  Trouble sleeping (insomnia).  Loss of balance or feeling lightheaded or dizzy.  Nausea or vomiting.  Numbness or tingling.  Increased sensitivity  to:  Sounds.  Lights.  Distractions.  Vision problems or eyes that tire easily.  Diminished sense of taste or smell.  Ringing in the ears.  Mood changes such as feeling sad or anxious.  Becoming easily irritated or angry for little or no reason.  Lack of motivation.  Seeing or hearing things other people do not see or hear (hallucinations). How is this diagnosed? This condition is diagnosed based on:  Medical history. Your health care provider will ask for a description of your activity and your injury.  Symptoms. Your health care provider will ask whether you passed out (lost consciousness) and whether you are having trouble remembering events that happened right before and during your injury. You may also have other tests, including:  A brain scan to look for signs of injury to the brain. You may still have a concussion even if the scan shows no injury.  Blood tests to be sure other problems are not present. How is this treated? This condition is usually treated in an emergency department, in an urgent care, or at a clinic.  Tell your health care provider if:  You are taking any medicines, including prescription medicines, over-the-counter medicines, and natural remedies. Some medicines, such as blood thinners (anticoagulants) and aspirin, may increase the chance of complications, such as bleeding.  You are taking or have taken alcohol or illegal drugs. Alcohol and certain other drugs may slow your recovery and can put you at risk of further injury.  Your health care provider will send you home with important instructions   to follow.  How fast you will recover from a concussion depends on many factors, such as how severe your concussion is, what part of your brain was injured, how old you are, and how healthy you were before the concussion. Recovery can take time.  Most people with mild injuries recover fully.  Recovery is slower in older people.  People who have had  a concussion in the past or have other medical problems may take longer to recover. Follow these instructions at home: Activity  Limit activities that require a lot of thought or concentration. These include:  Doing homework or job-related work.  Watching TV.  Working on the computer.  Rest. Rest helps the brain to heal. Make sure you:  Get plenty of sleep at night. Avoid staying up late at night.  Keep the same bedtime hours on weekends and weekdays.  Rest during the day. Take daytime naps or rest breaks when you feel tired.  Avoid situations that increase your risk for another head injury (football, hockey, soccer, basketball, martial arts, downhill snow sports, and horseback riding). Your condition will get worse every time you experience a concussion.  Ask your health care provider when you can return to your normal activities, such as school, work, athletics, driving, riding a bicycle, or operating heavy machinery. Your ability to react may be slower after a brain injury. Never do these activities if you are dizzy.  Return to your normal activities slowly, not all at once. You must give your body and brain enough time for recovery. Preventing another concussion  It is very important to avoid another brain injury, especially as you recover. In rare cases, another injury can lead to permanent brain damage, brain swelling, or death. The risk of this is greatest during the first 7-10 days after a head injury. Avoid injuries by:  Wearing a seat belt when riding in a car.  Limiting alcohol intake to no more than 1 drink a day for non-pregnant women and 2 drinks a day for men. One drink equals 12 oz. of beer, 5 oz. of wine, or 1 oz. of hard liquor.  Wearing a helmet when biking, skiing, skateboarding, skating, or doing similar activities.  Avoiding activities that could lead to a second concussion, such as contact or recreational sports, until your health care provider says it is  okay.  Taking safety measures in your home.  Remove clutter and tripping hazards from floors and stairways.  Use grab bars in bathrooms and handrails by stairs.  Place non-slip mats on floors and in bathtubs.  Improve lighting in dim areas. General instructions  Take over-the-counter and prescription medicines only as told by your health care provider.  Do not drink alcohol until your health care provider says you are well enough to do so.  If it is harder than usual to remember things, write them down.  If you are easily distracted, try to do one thing at a time. For example, do not try to watch TV while fixing dinner.  Talk with family members or close friends when making important decisions.  Watch your symptoms and tell others to do the same. Complications sometimes occur after a concussion. Older adults with a brain injury may have a higher risk of serious complications, such as a blood clot on the brain.  Tell your teachers, school nurse, school counselor, coach, athletic trainer, or work manager about your injury, symptoms, and restrictions. Tell them about what you can or cannot do. They should watch   for:  Increased problems with attention or concentration.  Increased difficulty remembering or learning new information.  Increased time needed to complete tasks or assignments.  Increased irritability or decreased ability to cope with stress.  Increased symptoms.  Keep all follow-up visits as told by your health care provider. This is important because repeated evaluation of your symptoms is recommended for your recovery. Contact a health care provider if:  You have increased problems paying attention or concentrating.  You have increased difficulty remembering or learning new information.  You need more time to complete tasks or assignments than before.  You have increased irritability or decreased ability to cope with stress.  You have more symptoms than  before. Seek medical care if you have any of the following symptoms for more than 2 weeks after your injury:  Long-lasting (chronic) headaches.  Dizziness or balance problems.  Nausea.  Vision problems.  Increased sensitivity to noise or light.  Depression or mood swings.  Anxiety or irritability.  Memory problems.  Difficulty concentrating or paying attention.  Sleep problems.  Feeling tired all the time. Get help right away if:  You have severe or worsening headaches. These may be a sign of a blood clot in the brain.  You have weakness (even if only in one hand, leg, or part of the face).  You have numbness.  You have decreased coordination.  You vomit repeatedly.  You have increased sleepiness.  One pupil is larger than the other.  You have convulsions.  You have slurred speech.  You have increased confusion. This may be a sign of a blood clot in the brain.  You have increased restlessness, agitation, or irritability.  You are unable to recognize people or places.  You have neck pain.  It is difficult to wake you up.  You have unusual behavior changes.  You lose consciousness. This information is not intended to replace advice given to you by your health care provider. Make sure you discuss any questions you have with your health care provider. Document Released: 01/08/2004 Document Revised: 03/25/2016 Document Reviewed: 05/10/2013 Elsevier Interactive Patient Education  2017 [employer]  

## 2016-11-19 ENCOUNTER — Telehealth: Payer: Self-pay | Admitting: Neurology

## 2016-11-19 NOTE — Telephone Encounter (Signed)
I called the patient. The patient has been set up for a CT scan of the head, not MRI. There should be no problem with claustrophobia, the patient does have alprazolam to take at any rate.

## 2016-11-19 NOTE — Telephone Encounter (Signed)
The Patient is schedule to have her CT at [facility] on 11/25/16 at 4:45 pm. But she informed me that she is very much claustrophobia and would need something. The best number to contact the patient is [phone]

## 2016-11-22 ENCOUNTER — Telehealth: Payer: Self-pay | Admitting: Neurology

## 2016-11-22 MED ORDER — METHOCARBAMOL 500 MG PO TABS: 500.0000 mg | ORAL_TABLET | Freq: Three times a day (TID) | ORAL | 1 refills | Status: DC

## 2016-11-22 MED ORDER — PREDNISONE 5 MG PO TABS: 5.0000 mg | ORAL_TABLET | Freq: Every day | ORAL | 0 refills | Status: DC

## 2016-11-22 NOTE — Telephone Encounter (Signed)
I called patient. 2 days ago, she began having increasing headache. The patient is having headache pain on the left side the head, going down into the neck and shoulder. The CT of the head is pending.  The patient was placed on a prednisone Dosepak, and some Robaxin. The patient has some stiffness of the neck.
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
## 2016-11-22 NOTE — Addendum Note (Signed)
Addended by: Stephanie AcreWILLIS, Mallie Linnemann on: 11/22/2016 06:05 PM   Modules accepted: Orders

## 2016-11-22 NOTE — Telephone Encounter (Signed)
Pt called said the HA got worse starting Saturday, today it is unbearable. She worked 1/2 day today. She is experiencing flashing lights, nausea, pressure on left side of head and neck.  Please call

## 2016-11-23 MED ORDER — TRAMADOL HCL 50 MG PO TABS: 50.0000 mg | ORAL_TABLET | Freq: Four times a day (QID) | ORAL | 1 refills | Status: DC | PRN

## 2016-11-23 NOTE — Telephone Encounter (Signed)
I called the patient, she still having a lot of discomfort in the head. She is to remain on the prednisone Dosepak and the Robaxin, I will: Ultram to take if needed for discomfort.

## 2016-11-23 NOTE — Telephone Encounter (Signed)
Pt called today said medication helped last night for about 4 hrs, steroid helped today for about 4 hrs. Pt describes her head as pulsating and the hair feels like needles are stuck in her head. RN was skyped, relayed she will talk with Dr Lacretia NicksW.  Please call

## 2016-11-23 NOTE — Addendum Note (Signed)
Addended by: Stephanie AcreWILLIS, Loneta Tamplin on: 11/23/2016 02:27 PM   Modules accepted: Orders

## 2016-11-23 NOTE — Telephone Encounter (Signed)
Rx printed, signed, faxed to pharmacy. 

## 2016-11-25 ENCOUNTER — Ambulatory Visit (HOSPITAL_COMMUNITY)
Admission: RE | Admit: 2016-11-25 | Discharge: 2016-11-25 | Disposition: A | Discharge status: Home / Self Care | Payer: 59 | Source: Ambulatory Visit | Attending: Neurology | Admitting: Neurology

## 2016-11-25 DIAGNOSIS — S060X9A Concussion with loss of consciousness of unspecified duration, initial encounter: Secondary | ICD-10-CM | POA: Diagnosis not present

## 2016-11-25 DIAGNOSIS — X58XXXA Exposure to other specified factors, initial encounter: Secondary | ICD-10-CM | POA: Insufficient documentation

## 2016-11-25 MED ORDER — CHLORPROMAZINE HCL 100 MG PO TABS: 100.0000 mg | ORAL_TABLET | Freq: Two times a day (BID) | ORAL | 0 refills | Status: DC
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
## 2016-11-25 NOTE — Addendum Note (Signed)
Addended by: Stephanie AcreWILLIS, Selyna Klahn on: 11/25/2016 07:05 PM   Modules accepted: Orders

## 2016-11-25 NOTE — Telephone Encounter (Signed)
Pt called said she is still experiencing dizziness, HA, confusion, sensitive to light,nausea. She said if she lies down the symptoms seem to subside somewhat. She said she is non functioning. Pt said she has CT today at 4:45.  Please call

## 2016-11-29 ENCOUNTER — Telehealth: Payer: Self-pay | Admitting: Neurology

## 2016-11-29 MED ORDER — TRAZODONE HCL 100 MG PO TABS: 100.0000 mg | ORAL_TABLET | Freq: Every day | ORAL | 1 refills | Status: DC

## 2016-11-29 NOTE — Telephone Encounter (Signed)
Patient calling in reference to the sleeping medication chlorproMAZINE (THORAZINE) 100 MG tablet Dr. Anne HahnWillis suggested.  Patient states headache was a lot better first night slept 8 hrs.  Second and third night medication wasn't as effective with sleeping and headache started to come back.  Patient wants to know if there is anything else to help her sleep that she can be on for a longer period of time. Pharmacy [facility] [place].  Please leave a message on patients phone if she does not answer per patient.

## 2016-11-29 NOTE — Addendum Note (Signed)
Addended by: Stephanie AcreWILLIS, CHARLES on: 11/29/2016 03:02 PM   Modules accepted: Orders

## 2016-11-29 NOTE — Telephone Encounter (Signed)
I called the patient. The patient has had chronic issues with insomnia, but when she took the Thorazine she was able to rest quite well and the headache got much better. The patient still doing better with the headache, but she is not sleeping as well. The patient will be given a trial on trazodone to help her sleep and hopefully help the headache. We will start at 100 mg at night, go up on the dose if needed.

## 2016-12-22 ENCOUNTER — Other Ambulatory Visit: Payer: Self-pay | Admitting: Adult Health

## 2017-01-03 ENCOUNTER — Ambulatory Visit (HOSPITAL_COMMUNITY)
Admission: RE | Admit: 2017-01-03 | Discharge: 2017-01-03 | Disposition: A | Discharge status: Home / Self Care | Payer: 59 | Attending: Psychiatry | Admitting: Psychiatry

## 2017-01-03 DIAGNOSIS — Z833 Family history of diabetes mellitus: Secondary | ICD-10-CM | POA: Diagnosis not present

## 2017-01-03 DIAGNOSIS — E559 Vitamin D deficiency, unspecified: Secondary | ICD-10-CM | POA: Insufficient documentation

## 2017-01-03 DIAGNOSIS — F329 Major depressive disorder, single episode, unspecified: Secondary | ICD-10-CM | POA: Insufficient documentation

## 2017-01-03 DIAGNOSIS — Z8249 Family history of ischemic heart disease and other diseases of the circulatory system: Secondary | ICD-10-CM | POA: Diagnosis not present

## 2017-01-03 DIAGNOSIS — F17210 Nicotine dependence, cigarettes, uncomplicated: Secondary | ICD-10-CM | POA: Insufficient documentation

## 2017-01-03 DIAGNOSIS — Z888 Allergy status to other drugs, medicaments and biological substances status: Secondary | ICD-10-CM | POA: Insufficient documentation

## 2017-01-03 DIAGNOSIS — F411 Generalized anxiety disorder: Secondary | ICD-10-CM | POA: Insufficient documentation

## 2017-01-03 DIAGNOSIS — Z811 Family history of alcohol abuse and dependence: Secondary | ICD-10-CM | POA: Insufficient documentation

## 2017-01-03 DIAGNOSIS — M5432 Sciatica, left side: Secondary | ICD-10-CM | POA: Diagnosis not present

## 2017-01-03 DIAGNOSIS — Z881 Allergy status to other antibiotic agents status: Secondary | ICD-10-CM | POA: Diagnosis not present

## 2017-01-03 DIAGNOSIS — K589 Irritable bowel syndrome without diarrhea: Secondary | ICD-10-CM | POA: Insufficient documentation

## 2017-01-03 DIAGNOSIS — F410 Panic disorder [episodic paroxysmal anxiety] without agoraphobia: Secondary | ICD-10-CM | POA: Diagnosis not present

## 2017-01-03 DIAGNOSIS — M797 Fibromyalgia: Secondary | ICD-10-CM | POA: Diagnosis not present

## 2017-01-03 DIAGNOSIS — M170 Bilateral primary osteoarthritis of knee: Secondary | ICD-10-CM | POA: Insufficient documentation
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
## 2017-01-03 NOTE — H&P (Signed)
Behavioral Health Medical Screening Exam  Mary Parker is an 36 y.o. female.  Total Time spent with patient: 15 minutes  Psychiatric Specialty Exam: Physical Exam  Constitutional: She is oriented to person, place, and time. She appears well-developed and well-nourished.  HENT:  Head: Normocephalic.  Neck: Normal range of motion.  Cardiovascular: Normal rate, regular rhythm and normal heart sounds.   Respiratory: Effort normal and breath sounds normal.  GI: Soft. Bowel sounds are normal.  Musculoskeletal: Normal range of motion.  Neurological: She is alert and oriented to person, place, and time.  Skin: Skin is warm and dry.    Review of Systems  Psychiatric/Behavioral: Positive for depression. Negative for hallucinations, substance abuse and suicidal ideas. The patient is nervous/anxious.   All other systems reviewed and are negative.   There were no vitals taken for this visit.There is no height or weight on file to calculate BMI.  General Appearance: Casual and Fairly Groomed  Eye Contact:  Fair  Speech:  Clear and Coherent and Normal Rate  Volume:  Normal  Mood:  Depressed  Affect:  Appropriate, Congruent and Depressed  Thought Process:  Coherent, Goal Directed, Linear and Descriptions of Associations: Loose  Orientation:  Full (Time, Place, and Person)  Thought Content:  Focused on safety of her children  Suicidal Thoughts:  No  Homicidal Thoughts:  No  Memory:  Immediate;   Fair Recent;   Fair Remote;   Fair  Judgement:  Fair  Insight:  Fair  Psychomotor Activity:  Normal  Concentration: Concentration: Fair  Recall:  [employer]Fair  Fund of Knowledge:Fair  Language: Fair  Akathisia:  No  Handed:    AIMS (if indicated):     Assets:  Communication Skills Desire for Improvement Resilience  Sleep:       Musculoskeletal: Strength & Muscle Tone: within normal limits Gait & Station: normal Patient leans: N/A  There were no vitals taken for this  visit.  Recommendations:  Based on my evaluation the patient does not appear to have an emergency medical condition.  Beau FannyWithrow, Algernon Mundie C, FNP 01/03/2017, 2:16 PM

## 2017-01-05 ENCOUNTER — Encounter: Payer: Self-pay | Admitting: Adult Health

## 2017-01-05 ENCOUNTER — Ambulatory Visit (INDEPENDENT_AMBULATORY_CARE_PROVIDER_SITE_OTHER): Payer: 59 | Admitting: Adult Health

## 2017-01-05 VITALS — BP 105/70 | HR 75 | Wt 186.0 lb

## 2017-01-05 DIAGNOSIS — G43119 Migraine with aura, intractable, without status migrainosus: Secondary | ICD-10-CM

## 2017-01-05 DIAGNOSIS — M797 Fibromyalgia: Secondary | ICD-10-CM

## 2017-01-05 DIAGNOSIS — R51 Headache: Secondary | ICD-10-CM

## 2017-01-05 DIAGNOSIS — R519 Headache, unspecified: Secondary | ICD-10-CM

## 2017-01-05 MED ORDER — GABAPENTIN 100 MG PO CAPS: ORAL_CAPSULE | ORAL | 5 refills | Status: DC

## 2017-01-05 NOTE — Progress Notes (Signed)
I have read the note, and I agree with the clinical assessment and plan.  Dede Dobesh KEITH   
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
## 2017-01-05 NOTE — Patient Instructions (Addendum)
Stop Ibuprofen  Fioricet only for migraines- do not take daily Start Gabapentin   Gabapentin capsules or tablets What is this medicine? GABAPENTIN (GA ba pen tin) is used to control partial seizures in adults with epilepsy. It is also used to treat certain types of nerve pain. This medicine may be used for other purposes; ask your health care provider or pharmacist if you have questions. COMMON BRAND NAME(S): Active-PAC with Gabapentin, Gabarone, Neurontin What should I tell my health care provider before I take this medicine? They need to know if you have any of these conditions: -kidney disease -suicidal thoughts, plans, or attempt; a previous suicide attempt by you or a family member -an unusual or allergic reaction to gabapentin, other medicines, foods, dyes, or preservatives -pregnant or trying to get pregnant -breast-feeding How should I use this medicine? Take this medicine by mouth with a glass of water. Follow the directions on the prescription label. You can take it with or without food. If it upsets your stomach, take it with food.Take your medicine at regular intervals. Do not take it more often than directed. Do not stop taking except on your doctor's advice. If you are directed to break the 600 or 800 mg tablets in half as part of your dose, the extra half tablet should be used for the next dose. If you have not used the extra half tablet within 28 days, it should be thrown away. A special MedGuide will be given to you by the pharmacist with each prescription and refill. Be sure to read this information carefully each time. Talk to your pediatrician regarding the use of this medicine in children. Special care may be needed. Overdosage: If you think you have taken too much of this medicine contact a poison control center or emergency room at once. NOTE: This medicine is only for you. Do not share this medicine with others. What if I miss a dose? If you miss a dose, take it as  soon as you can. If it is almost time for your next dose, take only that dose. Do not take double or extra doses. What may interact with this medicine? Do not take this medicine with any of the following medications: -other gabapentin products This medicine may also interact with the following medications: -alcohol -antacids -antihistamines for allergy, cough and cold -certain medicines for anxiety or sleep -certain medicines for depression or psychotic disturbances -homatropine; hydrocodone -naproxen -narcotic medicines (opiates) for pain -phenothiazines like chlorpromazine, mesoridazine, prochlorperazine, thioridazine This list may not describe all possible interactions. Give your health care provider a list of all the medicines, herbs, non-prescription drugs, or dietary supplements you use. Also tell them if you smoke, drink alcohol, or use illegal drugs. Some items may interact with your medicine. What should I watch for while using this medicine? Visit your doctor or health care professional for regular checks on your progress. You may want to keep a record at home of how you feel your condition is responding to treatment. You may want to share this information with your doctor or health care professional at each visit. You should contact your doctor or health care professional if your seizures get worse or if you have any new types of seizures. Do not stop taking this medicine or any of your seizure medicines unless instructed by your doctor or health care professional. Stopping your medicine suddenly can increase your seizures or their severity. Wear a medical identification bracelet or chain if you are taking this medicine for  seizures, and carry a card that lists all your medications. You may get drowsy, dizzy, or have blurred vision. Do not drive, use machinery, or do anything that needs mental alertness until you know how this medicine affects you. To reduce dizzy or fainting spells, do  not sit or stand up quickly, especially if you are an older patient. Alcohol can increase drowsiness and dizziness. Avoid alcoholic drinks. Your mouth may get dry. Chewing sugarless gum or sucking hard candy, and drinking plenty of water will help. The use of this medicine may increase the chance of suicidal thoughts or actions. Pay special attention to how you are responding while on this medicine. Any worsening of mood, or thoughts of suicide or dying should be reported to your health care professional right away. Women who become pregnant while using this medicine may enroll in the [place] American Antiepileptic Drug Pregnancy Registry by calling [phone]. This registry collects information about the safety of antiepileptic drug use during pregnancy. What side effects may I notice from receiving this medicine? Side effects that you should report to your doctor or health care professional as soon as possible: -allergic reactions like skin rash, itching or hives, swelling of the face, lips, or tongue -worsening of mood, thoughts or actions of suicide or dying Side effects that usually do not require medical attention (report to your doctor or health care professional if they continue or are bothersome): -constipation -difficulty walking or controlling muscle movements -dizziness -nausea -slurred speech -tiredness -tremors -weight gain This list may not describe all possible side effects. Call your doctor for medical advice about side effects. You may report side effects to FDA at 1-800-FDA-1088. Where should I keep my medicine? Keep out of reach of children. This medicine may cause accidental overdose and death if it taken by other adults, children, or pets. Mix any unused medicine with a substance like cat litter or coffee grounds. Then throw the medicine away in a sealed container like a sealed bag or a coffee can with a lid. Do not use the medicine after the expiration date. Store at  room temperature between 15 and 30 degrees C (59 and 86 degrees F). NOTE: This sheet is a summary. It may not cover all possible information. If you have questions about this medicine, talk to your doctor, pharmacist, or health care provider.  2018 Elsevier/Gold Standard (2013-12-14 15:26:50)

## 2017-01-09 ENCOUNTER — Encounter: Payer: Self-pay | Admitting: Adult Health

## 2017-01-14 ENCOUNTER — Ambulatory Visit (HOSPITAL_COMMUNITY): Payer: Self-pay | Admitting: Psychology

## 2017-01-31 ENCOUNTER — Other Ambulatory Visit: Payer: Self-pay | Admitting: Neurology

## 2017-01-31 ENCOUNTER — Encounter: Payer: Self-pay | Admitting: Neurology

## 2017-01-31 DIAGNOSIS — M79605 Pain in left leg: Secondary | ICD-10-CM

## 2017-02-01 ENCOUNTER — Other Ambulatory Visit: Payer: Self-pay | Admitting: Neurology

## 2017-02-09 ENCOUNTER — Other Ambulatory Visit: Payer: Self-pay | Admitting: Neurology

## 2017-02-09 DIAGNOSIS — M79605 Pain in left leg: Secondary | ICD-10-CM

## 2017-02-14 ENCOUNTER — Ambulatory Visit
Admission: RE | Admit: 2017-02-14 | Discharge: 2017-02-14 | Disposition: A | Discharge status: Home / Self Care | Payer: Medicaid Other | Source: Ambulatory Visit | Attending: Neurology | Admitting: Neurology

## 2017-02-14 DIAGNOSIS — M79605 Pain in left leg: Secondary | ICD-10-CM

## 2017-02-14 MED ORDER — METHYLPREDNISOLONE ACETATE 40 MG/ML INJ SUSP (RADIOLOG
120.0000 mg | Freq: Once | INTRAMUSCULAR | Status: AC
  Administered 2017-02-14: 120 mg via INTRA_ARTICULAR

## 2017-02-14 MED ORDER — IOPAMIDOL (ISOVUE-M 200) INJECTION 41%
1.0000 mL | Freq: Once | INTRAMUSCULAR | Status: AC
  Administered 2017-02-14: 1 mL via INTRA_ARTICULAR

## 2017-02-14 NOTE — Discharge Instructions (Signed)

## 2017-02-15 ENCOUNTER — Other Ambulatory Visit: Payer: Self-pay | Admitting: Neurology

## 2017-02-15 ENCOUNTER — Encounter: Payer: Self-pay | Admitting: Neurology

## 2017-02-15 MED ORDER — TAPENTADOL HCL 50 MG PO TABS: 50.0000 mg | ORAL_TABLET | Freq: Four times a day (QID) | ORAL | 0 refills | Status: DC | PRN

## 2017-02-17 NOTE — Telephone Encounter (Signed)
Called [facility] at [phone]. Spoke with Selena. PA nucynta approved effective 02/17/17-03/19/17. PA# [id number]. Referance # for call: [id number].

## 2017-03-03 ENCOUNTER — Encounter: Payer: Self-pay | Admitting: Neurology

## 2017-03-03 ENCOUNTER — Ambulatory Visit (INDEPENDENT_AMBULATORY_CARE_PROVIDER_SITE_OTHER): Payer: Medicaid Other | Admitting: Neurology

## 2017-03-03 ENCOUNTER — Other Ambulatory Visit: Payer: Self-pay | Admitting: Neurology

## 2017-03-03 VITALS — BP 116/82 | HR 75 | Ht 63.0 in | Wt 181.0 lb

## 2017-03-03 DIAGNOSIS — G8929 Other chronic pain: Secondary | ICD-10-CM

## 2017-03-03 DIAGNOSIS — G43019 Migraine without aura, intractable, without status migrainosus: Secondary | ICD-10-CM | POA: Diagnosis not present

## 2017-03-03 DIAGNOSIS — M25561 Pain in right knee: Secondary | ICD-10-CM

## 2017-03-03 DIAGNOSIS — M533 Sacrococcygeal disorders, not elsewhere classified: Secondary | ICD-10-CM

## 2017-03-03 DIAGNOSIS — M797 Fibromyalgia: Secondary | ICD-10-CM
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
## 2017-03-03 NOTE — Procedures (Signed)
   History: Mary LiasCasey Parker is a 36 year old patient with a history of fibromyalgia. She reports chronic pain in the thighs, she in the past has gained benefit with trigger point injections, she comes in today for this therapy.  Description of procedure: Marcaine was used as an anesthetic agent. A 0.5% solution was used, 50 mg per 10 mL solution. 0.5 mL of Marcaine was injected medially and laterally in the anterior thigh on 6 locations on each side for total of 12 injections. Injections were made distally, at midpoint, and proximally on each thigh. The patient tolerated the injections well. No complications of the procedure were noted.  Marcaine 0.5%:  NDC 5515 0-1 6 9-1 0  Lot number: [id number] Expiration date: November 2019

## 2017-03-03 NOTE — Patient Instructions (Signed)
We will get another left SI joint injection and get an orthopedic referral to evaluate the right knee.

## 2017-03-14 ENCOUNTER — Ambulatory Visit: Payer: 59 | Admitting: Adult Health

## 2017-03-19 ENCOUNTER — Other Ambulatory Visit: Payer: Self-pay | Admitting: Neurology

## 2017-03-20 ENCOUNTER — Encounter: Payer: Self-pay | Admitting: Neurology

## 2017-03-21 ENCOUNTER — Other Ambulatory Visit: Payer: Self-pay | Admitting: Neurology

## 2017-03-21 MED ORDER — TAPENTADOL HCL 50 MG PO TABS
50.0000 mg | ORAL_TABLET | Freq: Four times a day (QID) | ORAL | 0 refills | Status: DC | PRN
Start: 2017-03-21 — End: 2017-04-20

## 2017-03-21 MED ORDER — PREDNISONE 5 MG PO TABS: ORAL_TABLET | ORAL | 0 refills | Status: DC

## 2017-03-21 NOTE — Telephone Encounter (Signed)
Placed printed/signed rx nucynta up front for patient pick up.

## 2017-03-23 ENCOUNTER — Telehealth: Payer: Self-pay | Admitting: *Deleted

## 2017-03-23 ENCOUNTER — Encounter: Payer: Self-pay | Admitting: Gastroenterology

## 2017-03-23 NOTE — Telephone Encounter (Signed)
Faxed completed/signed PA for Nucynta to [facility]. Fax: [phone]. Received fax confirmation. Awaiting response.

## 2017-03-24 NOTE — Telephone Encounter (Addendum)
Called [facility] to check on status of PA submitted. Spoke with Selena. PA approved effective 03/23/17-03/18/18. EA#[id number]#[id number]  Interaction ID# for call: [id number].   [facility] Fax: [phone]. Received confirmation.

## 2017-03-25 ENCOUNTER — Encounter: Payer: Self-pay | Admitting: Neurology

## 2017-03-29 ENCOUNTER — Ambulatory Visit (INDEPENDENT_AMBULATORY_CARE_PROVIDER_SITE_OTHER): Payer: Medicaid Other | Admitting: Neurology

## 2017-03-29 ENCOUNTER — Encounter: Payer: Self-pay | Admitting: Neurology

## 2017-03-29 VITALS — BP 110/62 | HR 86 | Ht 63.0 in | Wt 183.5 lb

## 2017-03-29 DIAGNOSIS — M797 Fibromyalgia: Secondary | ICD-10-CM

## 2017-03-29 NOTE — Telephone Encounter (Signed)
Called pt. Scheduled appt for trigger point injections today at 12pm, check in 1145am. Advised pt to bring insurance cards, updated med list. She verbalized understanding.

## 2017-03-29 NOTE — Procedures (Signed)
     History: Mary LiasCasey Parker is a 36 year old patient with a history of fibromyalgia. The patient has neuromuscular discomfort in the thighs, and in the left shoulder. The patient comes in for trigger point injections today.  Description of procedure: Sensorcaine (bupivacaine 0.25%) was used in various locations involving the right thigh and left shoulder. 1 mL per injection site was used, with 3 injections laterally in the right thigh and 3 injections medially in the right thigh. Posteriorly, 3 injections were made in the midline of the thigh. Injections were done proximally, distally, and in between. 3 injections were done on the left shoulder along the medial border of the left shoulder blade. The patient tolerated the procedure well, no cough occasions of the procedure were noted.  Sensorcaine 0.25%, NDC 63323-4 6 5-5 7  Lot number is [id number]  Expiration date is March 2022

## 2017-04-01 ENCOUNTER — Encounter (HOSPITAL_COMMUNITY)
Admission: RE | Admit: 2017-04-01 | Discharge: 2017-04-01 | Disposition: A | Discharge status: Home / Self Care | Payer: Medicaid Other | Source: Ambulatory Visit | Attending: Internal Medicine | Admitting: Internal Medicine

## 2017-04-01 ENCOUNTER — Ambulatory Visit (INDEPENDENT_AMBULATORY_CARE_PROVIDER_SITE_OTHER): Payer: Medicaid Other | Admitting: Gastroenterology

## 2017-04-01 ENCOUNTER — Encounter: Payer: Self-pay | Admitting: Neurology

## 2017-04-01 ENCOUNTER — Other Ambulatory Visit: Payer: Self-pay

## 2017-04-01 ENCOUNTER — Encounter: Payer: Self-pay | Admitting: Gastroenterology

## 2017-04-01 ENCOUNTER — Encounter (HOSPITAL_COMMUNITY): Payer: Self-pay

## 2017-04-01 ENCOUNTER — Telehealth: Payer: Self-pay

## 2017-04-01 VITALS — BP 112/79 | HR 85 | Temp 98.6°F | Ht 62.0 in | Wt 180.6 lb

## 2017-04-01 DIAGNOSIS — K5900 Constipation, unspecified: Secondary | ICD-10-CM | POA: Diagnosis not present

## 2017-04-01 DIAGNOSIS — K625 Hemorrhage of anus and rectum: Secondary | ICD-10-CM | POA: Diagnosis not present

## 2017-04-01 DIAGNOSIS — Z01812 Encounter for preprocedural laboratory examination: Secondary | ICD-10-CM | POA: Diagnosis not present

## 2017-04-01 DIAGNOSIS — R1032 Left lower quadrant pain: Secondary | ICD-10-CM

## 2017-04-01 LAB — CBC WITH DIFFERENTIAL/PLATELET
Basophils Absolute: 0 10*3/uL (ref 0.0–0.1)
Basophils Relative: 0 %
Eosinophils Absolute: 0 10*3/uL (ref 0.0–0.7)
Eosinophils Relative: 0 %
HCT: 43.3 % (ref 36.0–46.0)
HEMOGLOBIN: 13.8 g/dL (ref 12.0–15.0)
LYMPHS ABS: 0.8 10*3/uL (ref 0.7–4.0)
LYMPHS PCT: 4 %
MCH: 30.9 pg (ref 26.0–34.0)
MCHC: 31.9 g/dL (ref 30.0–36.0)
MCV: 96.9 fL (ref 78.0–100.0)
Monocytes Absolute: 1.1 10*3/uL — ABNORMAL HIGH (ref 0.1–1.0)
Monocytes Relative: 5 %
NEUTROS ABS: 19.4 10*3/uL — AB (ref 1.7–7.7)
NEUTROS PCT: 91 %
Platelets: 227 10*3/uL (ref 150–400)
RBC: 4.47 MIL/uL (ref 3.87–5.11)
RDW: 13.8 % (ref 11.5–15.5)
WBC: 21.3 10*3/uL — ABNORMAL HIGH (ref 4.0–10.5)

## 2017-04-01 LAB — BASIC METABOLIC PANEL
Anion gap: 9 (ref 5–15)
BUN: 11 mg/dL (ref 6–20)
CHLORIDE: 106 mmol/L (ref 101–111)
CO2: 24 mmol/L (ref 22–32)
Calcium: 9.4 mg/dL (ref 8.9–10.3)
Creatinine, Ser: 0.69 mg/dL (ref 0.44–1.00)
GFR calc Af Amer: >60 mL/min (ref >60)
GFR calc non Af Amer: >60 mL/min (ref >60)
GLUCOSE: 110 mg/dL — AB (ref 65–99)
POTASSIUM: 4.5 mmol/L (ref 3.5–5.1)
SODIUM: 139 mmol/L (ref 135–145)

## 2017-04-01 MED ORDER — NA SULFATE-K SULFATE-MG SULF 17.5-3.13-1.6 GM/177ML PO SOLN: 1.0000 | ORAL | 0 refills | Status: DC

## 2017-04-01 NOTE — Telephone Encounter (Signed)
Per Tobi BastosAnna the patient needs to have her ct scan prior to her tcs.  I rescheduled her ct scan to 04/04/17 at 12:15 pm  ONGE#[id number]#[id number]

## 2017-04-01 NOTE — Assessment & Plan Note (Addendum)
Start Linzess 145 mcg once each morning, 30 minutes before breakfast. Samples provided. Call with update. 3 month follow-up. Will also obtain outside labs.

## 2017-04-01 NOTE — Assessment & Plan Note (Addendum)
Chronic for one year, with CT abd/pelvis a year ago without contrast noting a questionable abnormality of transverse colon that appeared narrowed that could be related to peristalsis but unable to exclude lesion. Chronic constipation likely playing a role. Linzess 145 mcg to start and update CT abd/pelvis WITH contrast prior to colonoscopy, which will be diagnostic due to history of chronic rectal bleeding. Would like to have CT on file first before proceeding with colonoscopy.

## 2017-04-01 NOTE — Patient Instructions (Addendum)
I have ordered a CT scan for further evaluation.   We have also scheduled a colonoscopy with Dr. Jena Gaussourk  in the near future.  For constipation, start taking Linzess 1 capsule each morning on an empty stomach, 30 minutes before breakfast. This may cause loose stool at first but it should get better. If not, call me.  It is best to decrease the Ibuprofen, as it is exceeding a safe amount daily. Please talk to your neurologist about this.  We will see you in 3 months!

## 2017-04-01 NOTE — Telephone Encounter (Signed)
Endo Scheduler called office and asked if pt could have TCS/+/-hem banding w/propofol done 04/07/17 at 12:15pm instead of 04/06/17. Called pt. She can do her procedure 04/07/17. She is going to pre-op today at 11:30am. [facility] when she gets her prep.

## 2017-04-01 NOTE — Progress Notes (Signed)
Primary Care Physician:  Benita Stabile, MD Primary Gastroenterologist:  Dr. Jena Gauss   Chief Complaint  Patient presents with  . Abdominal Pain    x1 year, getting worse  . Constipation    states she has IBS  . Diarrhea  . Emesis    states she has migraines    HPI:   Mary Parker is a 36 y.o. female presenting today at the request of Dr. Margo Aye secondary to multiple GI issues. CT in July 2017 without contrast noted a questionable abnormality of transverse colon that appeared narrowed that could be related to peristalsis but unable to exclude lesion. She was to see GI but due to multiple life events and insurance reasons, she was unable to come until now.   Notes LLQ abdominal pain for at least a year. Constant. Sharper pains intermittently but always underlying. Sometimes has to press and rub on it. Has had to put heating pad on it as well. Nothing really helps. BM usually 3-4 times a week. Has to strain.   Has history of hemorrhoids. Notes chronic issues with IBS. Predominant constipation. Also has history of fibromyalgia and takes IBUPROFEN 1000 mg FIVE TIMES A DAY. Nucynta without much help. Has occasional bright red blood per rectum, chronic. First colonoscopy when she was 36 years old. Last colonoscopy was performed at least 10+ years ago. States she was seen at [facility] years ago and given her a medication to put under tongue that would help with spasms. Will have 2-3 episodes a year of abdominal spasms that brings her to her knees.   N/V with migraines. Occasional reflux but knows what to avoid. No dysphagia.   Past Medical History:  Diagnosis Date  . Anxiety   . Arthritis    both knees  . Body aches 12/11/2014  . Chronic insomnia   . Chronic low back pain 05/29/2015  . Common migraine 12/19/2014  . Depression 12/11/2014  . Fibrocystic disease of both breasts   . Fibromyalgia 12/19/2014  . IBS (irritable bowel syndrome)   . Migraines   . Rebound headache 12/19/2014  . Sciatica of  left side   . Seizures (HCC)   . Vitamin D deficiency     Past Surgical History:  Procedure Laterality Date  . CESAREAN SECTION  [phone]  . CHOLECYSTECTOMY  2001  . ENDOMETRIAL ABLATION    . TUBAL LIGATION      Current Outpatient Prescriptions  Medication Sig Dispense Refill  . ALPRAZolam (XANAX) 0.5 MG tablet Take 1 tablet (0.5 mg total) by mouth 2 (two) times daily as needed for anxiety. 30 tablet 0  . butalbital-acetaminophen-caffeine (FIORICET, ESGIC) 50-325-40 MG per tablet Take 1 tablet by mouth every 4 (four) hours as needed for migraine.     Marland Kitchen escitalopram (LEXAPRO) 10 MG tablet TAKE 1 TABLET(10 MG) BY MOUTH DAILY 30 tablet 3  . ibuprofen (ADVIL,MOTRIN) 200 MG tablet Take 1,000 mg by mouth 5 (five) times daily.     . Multiple Vitamin (MULTIVITAMIN) capsule Take 1 capsule by mouth every other day.     . tapentadol (NUCYNTA) 50 MG tablet Take 1 tablet (50 mg total) by mouth every 6 (six) hours as needed. 30 tablet 0  . traZODone (DESYREL) 100 MG tablet TAKE 1 TABLET(100 MG) BY MOUTH AT BEDTIME 30 tablet 0  . Misc Natural Products (WHITE WILLOW BARK PO) Take by mouth.    . predniSONE (DELTASONE) 5 MG tablet Begin taking 6 tablets daily, taper by one tablet  daily until off the medication. (Patient not taking: Reported on 04/01/2017) 21 tablet 0  . riboflavin (VITAMIN B-2) 100 MG TABS tablet Take 100 mg by mouth daily.     No current facility-administered medications for this visit.     Allergies as of 04/01/2017 - Review Complete 04/01/2017  Allergen Reaction Noted  . Chantix [varenicline] Other (See Comments) 05/07/2015  . Ciprofloxacin Hives 12/11/2014  . Gabapentin Itching 03/03/2017  . Meloxicam Other (See Comments) 05/28/2016  . Nortriptyline  04/01/2015  . Topamax [topiramate] Swelling 04/01/2015  . Wellbutrin [bupropion] Other (See Comments) 05/07/2015    Family History  Problem Relation Age of Onset  . Hypertension Mother   . Other Mother        abnormal  cells; had hyst  . Obesity Daughter   . Diabetes Daughter [id number]       youngest  . Obesity Son   . Cancer Maternal Grandmother   . Osteoporosis Maternal Grandmother   . Cancer Maternal Grandfather   . Obesity Daughter   . Diabetes Father   . Hypertension Father   . Alcohol abuse Father     Social History   Social History  . Marital status: Divorced    Spouse name: N/A  . Number of children: 3  . Years of education: HS   Occupational History  . [employer]    Social History Main Topics  . Smoking status: Current Every Day Smoker    Packs/day: 1.50    Years: 20.00    Types: Cigarettes  . Smokeless tobacco: Never Used  . Alcohol use No  . Drug use: No  . Sexual activity: Not Currently    Birth control/ protection: Surgical     Comment: tubal and ablation   Other Topics Concern  . Not on file   Social History Narrative   Patient is right handed.   Patient drinks 1-2 cups of caffeine dailyy.   Patient lives mom stepdad, and children.       Review of Systems: Gen: Denies any fever, chills, fatigue, weight loss, lack of appetite.  CV: Denies chest pain, heart palpitations, peripheral edema, syncope.  Resp: Denies shortness of breath at rest or with exertion. Denies wheezing or cough.  GI:see HPI  GU : Denies urinary burning, urinary frequency, urinary hesitancy MS: chronic pain  Derm: Denies rash, itching, dry skin Psych: Denies depression, anxiety, memory loss, and confusion Heme: see HPI   Physical Exam: BP 112/79   Pulse 85   Temp 98.6 F (37 C) (Oral)   Ht 5\' 2"  (1.575 m)   Wt 180 lb 9.6 oz (81.9 kg)   BMI 33.03 kg/m  General:   Alert and oriented. Pleasant and cooperative. Well-nourished and well-developed.  Head:  Normocephalic and atraumatic. Eyes:  Without icterus, sclera clear and conjunctiva pink.  Ears:  Normal auditory acuity. Nose:  No deformity, discharge,  or lesions. Mouth:  No deformity or lesions, oral mucosa pink.  Lungs:  Clear to  auscultation bilaterally. No wheezes, rales, or rhonchi. No distress.  Heart:  S1, S2 present without murmurs appreciated.  Abdomen:  +BS, soft, mild LLQ TTP and non-distended. No HSM noted. No guarding or rebound. No masses appreciated.  Rectal:  Deferred  Msk:  Symmetrical without gross deformities. Normal posture. Extremities:  Without  edema. Neurologic:  Alert and  oriented x4;  grossly normal neurologically. Psych:  Alert and cooperative. Normal mood and affect.

## 2017-04-01 NOTE — Patient Instructions (Signed)
Mary HurtCasey J Parker  04/01/2017     @PREFPERIOPPHARMACY @   Your procedure is scheduled on  04/07/2017   [facility] at  1045  A.M.  Call this number if you have problems the morning of surgery:  [phone]   Remember:  Do not eat food or drink liquids after midnight.  Take these medicines the morning of surgery with A SIP OF WATER  Xanax, fioricet (if needed), lexapro, torentadol (if needed).   Do not wear jewelry, make-up or nail polish.  Do not wear lotions, powders, or perfumes, or deoderant.  Do not shave 48 hours prior to surgery.  Men may shave face and neck.  Do not bring valuables to the hospital.  [facility] is not responsible for any belongings or valuables.  Contacts, dentures or bridgework may not be worn into surgery.  Leave your suitcase in the car.  After surgery it may be brought to your room.  For patients admitted to the hospital, discharge time will be determined by your treatment team.  Patients discharged the day of surgery will not be allowed to drive home.   Name and phone number of your driver:   family Special instructions:  Follow the diet and prep instructions given to you by Dr Luvenia Starchourk's office.  Please read over the following fact sheets that you were given. Anesthesia Post-op Instructions and Care and Recovery After Surgery       Colonoscopy, Adult A colonoscopy is an exam to look at the entire large intestine. During the exam, a lubricated, bendable tube is inserted into the anus and then passed into the rectum, colon, and other parts of the large intestine. A colonoscopy is often done as a part of normal colorectal screening or in response to certain symptoms, such as anemia, persistent diarrhea, abdominal pain, and blood in the stool. The exam can help screen for and diagnose medical problems, including:  Tumors.  Polyps.  Inflammation.  Areas of bleeding.  Tell a health care provider about:  Any allergies you  have.  All medicines you are taking, including vitamins, herbs, eye drops, creams, and over-the-counter medicines.  Any problems you or family members have had with anesthetic medicines.  Any blood disorders you have.  Any surgeries you have had.  Any medical conditions you have.  Any problems you have had passing stool. What are the risks? Generally, this is a safe procedure. However, problems may occur, including:  Bleeding.  A tear in the intestine.  A reaction to medicines given during the exam.  Infection (rare).  What happens before the procedure? Eating and drinking restrictions Follow instructions from your health care provider about eating and drinking, which may include:  A few days before the procedure - follow a low-fiber diet. Avoid nuts, seeds, dried fruit, raw fruits, and vegetables.  1-3 days before the procedure - follow a clear liquid diet. Drink only clear liquids, such as clear broth or bouillon, black coffee or tea, clear juice, clear soft drinks or sports drinks, gelatin dessert, and popsicles. Avoid any liquids that contain red or purple dye.  On the day of the procedure - do not eat or drink anything during the 2 hours before the procedure, or within the time period that your health care provider recommends.  Bowel prep If you were prescribed an oral bowel prep to clean out your colon:  Take it as told by your health care provider. Starting  the day before your procedure, you will need to drink a large amount of medicated liquid. The liquid will cause you to have multiple loose stools until your stool is almost clear or light green.  If your skin or anus gets irritated from diarrhea, you may use these to relieve the irritation: ? Medicated wipes, such as adult wet wipes with aloe and vitamin E. ? A skin soothing-product like petroleum jelly.  If you vomit while drinking the bowel prep, take a break for up to 60 minutes and then begin the bowel prep  again. If vomiting continues and you cannot take the bowel prep without vomiting, call your health care provider.  General instructions  Ask your health care provider about changing or stopping your regular medicines. This is especially important if you are taking diabetes medicines or blood thinners.  Plan to have someone take you home from the hospital or clinic. What happens during the procedure?  An IV tube may be inserted into one of your veins.  You will be given medicine to help you relax (sedative).  To reduce your risk of infection: ? Your health care team will wash or sanitize their hands. ? Your anal area will be washed with soap.  You will be asked to lie on your side with your knees bent.  Your health care provider will lubricate a long, thin, flexible tube. The tube will have a camera and a light on the end.  The tube will be inserted into your anus.  The tube will be gently eased through your rectum and colon.  Air will be delivered into your colon to keep it open. You may feel some pressure or cramping.  The camera will be used to take images during the procedure.  A small tissue sample may be removed from your body to be examined under a microscope (biopsy). If any potential problems are found, the tissue will be sent to a lab for testing.  If small polyps are found, your health care provider may remove them and have them checked for cancer cells.  The tube that was inserted into your anus will be slowly removed. The procedure may vary among health care providers and hospitals. What happens after the procedure?  Your blood pressure, heart rate, breathing rate, and blood oxygen level will be monitored until the medicines you were given have worn off.  Do not drive for 24 hours after the exam.  You may have a small amount of blood in your stool.  You may pass gas and have mild abdominal cramping or bloating due to the air that was used to inflate your colon  during the exam.  It is up to you to get the results of your procedure. Ask your health care provider, or the department performing the procedure, when your results will be ready. This information is not intended to replace advice given to you by your health care provider. Make sure you discuss any questions you have with your health care provider. Document Released: 10/15/2000 Document Revised: 08/18/2016 Document Reviewed: 12/30/2015 Elsevier Interactive Patient Education  2018 [employer].  Colonoscopy, Adult, Care After This sheet gives you information about how to care for yourself after your procedure. Your health care provider may also give you more specific instructions. If you have problems or questions, contact your health care provider. What can I expect after the procedure? After the procedure, it is common to have:  A small amount of blood in your stool for 24  hours after the procedure.  Some gas.  Mild abdominal cramping or bloating.  Follow these instructions at home: General instructions   For the first 24 hours after the procedure: ? Do not drive or use machinery. ? Do not sign important documents. ? Do not drink alcohol. ? Do your regular daily activities at a slower pace than normal. ? Eat soft, easy-to-digest foods. ? Rest often.  Take over-the-counter or prescription medicines only as told by your health care provider.  It is up to you to get the results of your procedure. Ask your health care provider, or the department performing the procedure, when your results will be ready. Relieving cramping and bloating  Try walking around when you have cramps or feel bloated.  Apply heat to your abdomen as told by your health care provider. Use a heat source that your health care provider recommends, such as a moist heat pack or a heating pad. ? Place a towel between your skin and the heat source. ? Leave the heat on for 20-30 minutes. ? Remove the heat if your  skin turns bright red. This is especially important if you are unable to feel pain, heat, or cold. You may have a greater risk of getting burned. Eating and drinking  Drink enough fluid to keep your urine clear or pale yellow.  Resume your normal diet as instructed by your health care provider. Avoid heavy or fried foods that are hard to digest.  Avoid drinking alcohol for as long as instructed by your health care provider. Contact a health care provider if:  You have blood in your stool 2-3 days after the procedure. Get help right away if:  You have more than a small spotting of blood in your stool.  You pass large blood clots in your stool.  Your abdomen is swollen.  You have nausea or vomiting.  You have a fever.  You have increasing abdominal pain that is not relieved with medicine. This information is not intended to replace advice given to you by your health care provider. Make sure you discuss any questions you have with your health care provider. Document Released: 06/01/2004 Document Revised: 07/12/2016 Document Reviewed: 12/30/2015 Elsevier Interactive Patient Education  2018 [employer] Anesthesia is a term that refers to techniques, procedures, and medicines that help a person stay safe and comfortable during a medical procedure. Monitored anesthesia care, or sedation, is one type of anesthesia. Your anesthesia specialist may recommend sedation if you will be having a procedure that does not require you to be unconscious, such as:  Cataract surgery.  A dental procedure.  A biopsy.  A colonoscopy.  During the procedure, you may receive a medicine to help you relax (sedative). There are three levels of sedation:  Mild sedation. At this level, you may feel awake and relaxed. You will be able to follow directions.  Moderate sedation. At this level, you will be sleepy. You may not remember the procedure.  Deep sedation. At this level,  you will be asleep. You will not remember the procedure.  The more medicine you are given, the deeper your level of sedation will be. Depending on how you respond to the procedure, the anesthesia specialist may change your level of sedation or the type of anesthesia to fit your needs. An anesthesia specialist will monitor you closely during the procedure. Let your health care provider know about:  Any allergies you have.  All medicines you are taking, including vitamins,  herbs, eye drops, creams, and over-the-counter medicines.  Any use of steroids (by mouth or as a cream).  Any problems you or family members have had with sedatives and anesthetic medicines.  Any blood disorders you have.  Any surgeries you have had.  Any medical conditions you have, such as sleep apnea.  Whether you are pregnant or may be pregnant.  Any use of cigarettes, alcohol, or street drugs. What are the risks? Generally, this is a safe procedure. However, problems may occur, including:  Getting too much medicine (oversedation).  Nausea.  Allergic reaction to medicines.  Trouble breathing. If this happens, a breathing tube may be used to help with breathing. It will be removed when you are awake and breathing on your own.  Heart trouble.  Lung trouble.  Before the procedure Staying hydrated Follow instructions from your health care provider about hydration, which may include:  Up to 2 hours before the procedure - you may continue to drink clear liquids, such as water, clear fruit juice, black coffee, and plain tea.  Eating and drinking restrictions Follow instructions from your health care provider about eating and drinking, which may include:  8 hours before the procedure - stop eating heavy meals or foods such as meat, fried foods, or fatty foods.  6 hours before the procedure - stop eating light meals or foods, such as toast or cereal.  6 hours before the procedure - stop drinking milk or  drinks that contain milk.  2 hours before the procedure - stop drinking clear liquids.  Medicines Ask your health care provider about:  Changing or stopping your regular medicines. This is especially important if you are taking diabetes medicines or blood thinners.  Taking medicines such as aspirin and ibuprofen. These medicines can thin your blood. Do not take these medicines before your procedure if your health care provider instructs you not to.  Tests and exams  You will have a physical exam.  You may have blood tests done to show: ? How well your kidneys and liver are working. ? How well your blood can clot.  General instructions  Plan to have someone take you home from the hospital or clinic.  If you will be going home right after the procedure, plan to have someone with you for 24 hours.  What happens during the procedure?  Your blood pressure, heart rate, breathing, level of pain and overall condition will be monitored.  An IV tube will be inserted into one of your veins.  Your anesthesia specialist will give you medicines as needed to keep you comfortable during the procedure. This may mean changing the level of sedation.  The procedure will be performed. After the procedure  Your blood pressure, heart rate, breathing rate, and blood oxygen level will be monitored until the medicines you were given have worn off.  Do not drive for 24 hours if you received a sedative.  You may: ? Feel sleepy, clumsy, or nauseous. ? Feel forgetful about what happened after the procedure. ? Have a sore throat if you had a breathing tube during the procedure. ? Vomit. This information is not intended to replace advice given to you by your health care provider. Make sure you discuss any questions you have with your health care provider. Document Released: 07/14/2005 Document Revised: 03/26/2016 Document Reviewed: 02/08/2016 Elsevier Interactive Patient Education  2018 [place], Care After These instructions provide you with information about caring for yourself after your procedure. Your  health care provider may also give you more specific instructions. Your treatment has been planned according to current medical practices, but problems sometimes occur. Call your health care provider if you have any problems or questions after your procedure. What can I expect after the procedure? After your procedure, it is common to:  Feel sleepy for several hours.  Feel clumsy and have poor balance for several hours.  Feel forgetful about what happened after the procedure.  Have poor judgment for several hours.  Feel nauseous or vomit.  Have a sore throat if you had a breathing tube during the procedure.  Follow these instructions at home: For at least 24 hours after the procedure:   Do not: ? Participate in activities in which you could fall or become injured. ? Drive. ? Use heavy machinery. ? Drink alcohol. ? Take sleeping pills or medicines that cause drowsiness. ? Make important decisions or sign legal documents. ? Take care of children on your own.  Rest. Eating and drinking  Follow the diet that is recommended by your health care provider.  If you vomit, drink water, juice, or soup when you can drink without vomiting.  Make sure you have little or no nausea before eating solid foods. General instructions  Have a responsible adult stay with you until you are awake and alert.  Take over-the-counter and prescription medicines only as told by your health care provider.  If you smoke, do not smoke without supervision.  Keep all follow-up visits as told by your health care provider. This is important. Contact a health care provider if:  You keep feeling nauseous or you keep vomiting.  You feel light-headed.  You develop a rash.  You have a fever. Get help right away if:  You have trouble breathing. This  information is not intended to replace advice given to you by your health care provider. Make sure you discuss any questions you have with your health care provider. Document Released: 02/08/2016 Document Revised: 06/09/2016 Document Reviewed: 02/08/2016 Elsevier Interactive Patient Education  [employer].

## 2017-04-02 DIAGNOSIS — K625 Hemorrhage of anus and rectum: Secondary | ICD-10-CM | POA: Insufficient documentation

## 2017-04-02 NOTE — Assessment & Plan Note (Signed)
36 year old female with chronic rectal bleeding, reporting a colonoscopy over 10 years ago but unsure where this was performed. Likely benign anorectal source but will pursue diagnostic colonoscopy in near future. As of note ,she is taking Ibuprofen 1000 mg five times a day due to chronic pain in setting of fibromyalgia. I have discussed with her the dangers of such high dosing and asked that she scale this back considerably as well as speak to her neurologist regarding other pain medication regimens.   Proceed with TCS with Dr. Jena Gaussourk in near future: the risks, benefits, and alternatives have been discussed with the patient in detail. The patient states understanding and desires to proceed. PROPOFOL due to polypharmacy

## 2017-04-03 NOTE — Progress Notes (Signed)
Pt should f/u w pcp as discussed with EG

## 2017-04-04 ENCOUNTER — Ambulatory Visit (HOSPITAL_COMMUNITY): Payer: Medicaid Other

## 2017-04-04 ENCOUNTER — Encounter: Payer: Self-pay | Admitting: Neurology

## 2017-04-04 ENCOUNTER — Ambulatory Visit (HOSPITAL_COMMUNITY)
Admission: RE | Admit: 2017-04-04 | Discharge: 2017-04-04 | Disposition: A | Discharge status: Home / Self Care | Payer: Medicaid Other | Source: Ambulatory Visit | Attending: Gastroenterology | Admitting: Gastroenterology

## 2017-04-04 DIAGNOSIS — R1909 Other intra-abdominal and pelvic swelling, mass and lump: Secondary | ICD-10-CM | POA: Diagnosis not present

## 2017-04-04 DIAGNOSIS — R1032 Left lower quadrant pain: Secondary | ICD-10-CM

## 2017-04-04 MED ORDER — IOPAMIDOL (ISOVUE-300) INJECTION 61%
100.0000 mL | Freq: Once | INTRAVENOUS | Status: AC | PRN
  Administered 2017-04-04: 100 mL via INTRAVENOUS

## 2017-04-04 NOTE — Progress Notes (Signed)
cc'ed to pcp °

## 2017-04-04 NOTE — Telephone Encounter (Signed)
Late entry for 04/04/17 at approx 11:50am: RMR doesn't do hemorrhoid bandings with tcs. Called Endo Scheduler to remove possible hemorrhoid banding from procedure for 04/07/17. CM called and informed pt that she would have to come in to the office to see RMR is she needs a hemorrhoid banding.

## 2017-04-06 ENCOUNTER — Other Ambulatory Visit (HOSPITAL_COMMUNITY)
Admission: RE | Admit: 2017-04-06 | Discharge: 2017-04-06 | Disposition: A | Discharge status: Home / Self Care | Payer: Medicaid Other | Source: Ambulatory Visit | Attending: Gastroenterology | Admitting: Gastroenterology

## 2017-04-06 ENCOUNTER — Other Ambulatory Visit: Payer: Self-pay

## 2017-04-06 ENCOUNTER — Other Ambulatory Visit: Payer: Self-pay | Admitting: Gastroenterology

## 2017-04-06 ENCOUNTER — Encounter: Payer: Self-pay | Admitting: Neurology

## 2017-04-06 ENCOUNTER — Telehealth: Payer: Self-pay | Admitting: Gastroenterology

## 2017-04-06 DIAGNOSIS — D72829 Elevated white blood cell count, unspecified: Secondary | ICD-10-CM | POA: Diagnosis present

## 2017-04-06 LAB — CBC WITH DIFFERENTIAL/PLATELET
Basophils Absolute: 0 10*3/uL (ref 0.0–0.1)
Basophils Relative: 0 %
EOS ABS: 0.2 10*3/uL (ref 0.0–0.7)
Eosinophils Relative: 3 %
HCT: 41.3 % (ref 36.0–46.0)
HEMOGLOBIN: 13.3 g/dL (ref 12.0–15.0)
LYMPHS ABS: 1.7 10*3/uL (ref 0.7–4.0)
Lymphocytes Relative: 18 %
MCH: 31 pg (ref 26.0–34.0)
MCHC: 32.2 g/dL (ref 30.0–36.0)
MCV: 96.3 fL (ref 78.0–100.0)
MONO ABS: 0.6 10*3/uL (ref 0.1–1.0)
MONOS PCT: 6 %
NEUTROS PCT: 73 %
Neutro Abs: 7 10*3/uL (ref 1.7–7.7)
Platelets: 193 10*3/uL (ref 150–400)
RBC: 4.29 MIL/uL (ref 3.87–5.11)
RDW: 13.9 % (ref 11.5–15.5)
WBC: 9.5 10*3/uL (ref 4.0–10.5)

## 2017-04-06 NOTE — Progress Notes (Signed)
Mary Parker, this nice lady will be coming to see you in the next few weeks for an upcoming appt. I have already addressed the CT, but there is a left adnexal lesion that I wanted to make sure you were aware of when she sees you to establish care. This is just an [place]. We have copied to her PCP as well. Thanks, Tobi Bastosnna.

## 2017-04-06 NOTE — Telephone Encounter (Signed)
Lab order done 

## 2017-04-06 NOTE — Telephone Encounter (Signed)
WBC elevated at 21 on 6/1. Patient was supposed to see PCP. Concern for URI. Did she get seen? We may need to cancel procedure for 6/7. I received call from endo about it. If she has not seen PCP and had repeat CBC, need stat CBC today. High likelihood of this being cancelled.   Also, endo still has "possible banding" on their side of things with colonoscopy. I told them we had already requested to take this off, as noted in telephone documentation 6/1. I have asked Britta MccreedyBarbara to take this off. Colonoscopy only.

## 2017-04-06 NOTE — Progress Notes (Signed)
CT shows similar findings as last year with focal areas of narrowing involving the mid and distal transverse colon. Unclear if this is colonic spasm or mild stricture. She is to have a colonoscopy on 6/7. She has a left adnexal lesion that needs further evaluation via pelvic ultrasound: please send to PCP and make sure they are aware of CT findings. She will need to have this followed.

## 2017-04-06 NOTE — Telephone Encounter (Signed)
I spoke with the patient and she stated that she has an appt today with her PCP, however she spoke with him on the phone and he feels like her elevated wbc is likely from being on steroids for her knee.  She is on her way to the hospital to get a STAT lab.

## 2017-04-06 NOTE — Telephone Encounter (Signed)
Called Endo Scheduler to confirm hemorrhoid banding had been taken off of colonoscopy orders. She said hemorrhoid banding is not on the booking, it may be on consent. Asked her is someone was going to fix the consent, she said yes because the consent would have to match the procedure. Asked her if there was anything I needed to do to fix it, she said no.

## 2017-04-06 NOTE — Telephone Encounter (Signed)
CBC is normal. Patient aware. Colonoscopy as planned.

## 2017-04-06 NOTE — Telephone Encounter (Signed)
That would make sense. Ok, thanks so much!

## 2017-04-07 ENCOUNTER — Ambulatory Visit (HOSPITAL_COMMUNITY)
Admission: RE | Admit: 2017-04-07 | Discharge: 2017-04-07 | Disposition: A | Discharge status: Home / Self Care | Payer: Medicaid Other | Source: Ambulatory Visit | Attending: Internal Medicine | Admitting: Internal Medicine

## 2017-04-07 ENCOUNTER — Ambulatory Visit (HOSPITAL_COMMUNITY): Payer: Medicaid Other | Admitting: Anesthesiology

## 2017-04-07 ENCOUNTER — Encounter (HOSPITAL_COMMUNITY)
Admission: RE | Disposition: A | Discharge status: Home / Self Care | Payer: Self-pay | Source: Ambulatory Visit | Attending: Internal Medicine

## 2017-04-07 ENCOUNTER — Encounter (HOSPITAL_COMMUNITY): Payer: Self-pay

## 2017-04-07 DIAGNOSIS — R933 Abnormal findings on diagnostic imaging of other parts of digestive tract: Secondary | ICD-10-CM | POA: Diagnosis not present

## 2017-04-07 DIAGNOSIS — K625 Hemorrhage of anus and rectum: Secondary | ICD-10-CM

## 2017-04-07 DIAGNOSIS — K640 First degree hemorrhoids: Secondary | ICD-10-CM | POA: Diagnosis not present

## 2017-04-07 DIAGNOSIS — K648 Other hemorrhoids: Secondary | ICD-10-CM | POA: Insufficient documentation

## 2017-04-07 HISTORY — PX: COLONOSCOPY WITH PROPOFOL: SHX5780

## 2017-04-07 SURGERY — COLONOSCOPY WITH PROPOFOL
Anesthesia: Monitor Anesthesia Care

## 2017-04-07 MED ORDER — MIDAZOLAM HCL 2 MG/2ML IJ SOLN
INTRAMUSCULAR | Status: AC
  Filled 2017-04-07: qty 2

## 2017-04-07 MED ORDER — PROPOFOL 10 MG/ML IV BOLUS
INTRAVENOUS | Status: AC
  Filled 2017-04-07: qty 40

## 2017-04-07 MED ORDER — FENTANYL CITRATE (PF) 100 MCG/2ML IJ SOLN
25.0000 ug | Freq: Once | INTRAMUSCULAR | Status: AC
  Administered 2017-04-07: 25 ug via INTRAVENOUS

## 2017-04-07 MED ORDER — CHLORHEXIDINE GLUCONATE CLOTH 2 % EX PADS: 6.0000 | MEDICATED_PAD | Freq: Once | CUTANEOUS | Status: DC

## 2017-04-07 MED ORDER — PROPOFOL 10 MG/ML IV BOLUS
INTRAVENOUS | Status: DC | PRN
Start: 2017-04-07 — End: 2017-04-07
  Administered 2017-04-07 (×3): 20 mg via INTRAVENOUS

## 2017-04-07 MED ORDER — PROPOFOL 500 MG/50ML IV EMUL
INTRAVENOUS | Status: DC | PRN
  Administered 2017-04-07: 150 ug/kg/min via INTRAVENOUS

## 2017-04-07 MED ORDER — FENTANYL CITRATE (PF) 100 MCG/2ML IJ SOLN
INTRAMUSCULAR | Status: AC
  Filled 2017-04-07: qty 2

## 2017-04-07 MED ORDER — MIDAZOLAM HCL 2 MG/2ML IJ SOLN
1.0000 mg | INTRAMUSCULAR | Status: AC
  Administered 2017-04-07: 2 mg via INTRAVENOUS

## 2017-04-07 MED ORDER — LACTATED RINGERS IV SOLN
INTRAVENOUS | Status: DC
  Administered 2017-04-07: 11:00:00 via INTRAVENOUS

## 2017-04-07 NOTE — H&P (View-Only) (Signed)
CT shows similar findings as last year with focal areas of narrowing involving the mid and distal transverse colon. Unclear if this is colonic spasm or mild stricture. She is to have a colonoscopy on 6/7. She has a left adnexal lesion that needs further evaluation via pelvic ultrasound: please send to PCP and make sure they are aware of CT findings. She will need to have this followed.

## 2017-04-07 NOTE — Anesthesia Procedure Notes (Signed)
Procedure Name: MAC Date/Time: 04/07/2017 11:18 AM Performed by: Vista Deck Pre-anesthesia Checklist: Patient identified, Emergency Drugs available, Suction available, Timeout performed and Patient being monitored Patient Re-evaluated:Patient Re-evaluated prior to inductionOxygen Delivery Method: Non-rebreather mask

## 2017-04-07 NOTE — Op Note (Addendum)
[facility] Patient Name: Hilda LiasCasey Apperson Procedure Date: 04/07/2017 10:56 AM MRN: [id number] Date of Birth: 1981/10/02 Attending MD: Gennette Pacobert Michael Besan Ketchem , MD CSN: [id number] Age: 89 Admit Type: Outpatient Procedure:                Colonoscopy Indications:              Abnormal CT of the GI tract Providers:                Gennette Pacobert Michael Paizleigh Wilds, MD, Loma MessingLurae B. Patsy LagerAlbert RN, RN,                            Dyann Ruddleonya Wilson Referring MD:              Medicines:                Propofol per Anesthesia Complications:            No immediate complications. Estimated Blood Loss:     Estimated blood loss: none. Procedure:                Pre-Anesthesia Assessment:                           - Prior to the procedure, a History and Physical                            was performed, and patient medications and                            allergies were reviewed. The patient's tolerance of                            previous anesthesia was also reviewed. The risks                            and benefits of the procedure and the sedation                            options and risks were discussed with the patient.                            All questions were answered, and informed consent                            was obtained. Prior Anticoagulants: The patient has                            taken no previous anticoagulant or antiplatelet                            agents. ASA Grade Assessment: III - A patient with                            severe systemic disease. After reviewing the risks  and benefits, the patient was deemed in                            satisfactory condition to undergo the procedure.                           After obtaining informed consent, the colonoscope                            was passed under direct vision. Throughout the                            procedure, the patient's blood pressure, pulse, and                            oxygen saturations were  monitored continuously. The                            EC-[id number] ([id number]) scope was introduced through                            the anus and advanced to the the cecum, identified                            by appendiceal orifice and ileocecal valve. The                            colonoscopy was performed without difficulty. The                            patient tolerated the procedure well. The entire                            colon was well visualized. The ileocecal valve,                            appendiceal orifice, and rectum were photographed.                            The quality of the bowel preparation was adequate. Scope In: 11:28:08 AM Scope Out: 11:40:05 AM Scope Withdrawal Time: 0 hours 5 minutes 16 seconds  Total Procedure Duration: 0 hours 11 minutes 57 seconds  Findings:      The perianal and digital rectal examinations were normal.      The colon (entire examined portion) appeared normal.      The exam was otherwise without abnormality on direct and retroflexion       views.      Internal hemorrhoids were found during retroflexion. The hemorrhoids       were Grade I (internal hemorrhoids that do not prolapse). Impression:               - The entire examined colon is normal.                           - The  examination was otherwise normal on direct                            and retroflexion views.                           - Internal hemorrhoids. I suspect trivial bleeding                            from hemorrhoids. CT abnormality is artifactual.                           - No specimens collected. Moderate Sedation:      Moderate (conscious) sedation was personally administered by an       anesthesia professional. The following parameters were monitored: oxygen       saturation, heart rate, blood pressure, respiratory rate, EKG, adequacy       of pulmonary ventilation, and response to care. Total physician       intraservice time was 20  minutes. Recommendation:           - Patient has a contact number available for                            emergencies. The signs and symptoms of potential                            delayed complications were discussed with the                            patient. Return to normal activities tomorrow.                            Written discharge instructions were provided to the                            patient.                           - Resume previous diet.                           - Continue present medications. (Begin Linzess                            daily as previously recommended                           - Repeat colonoscopy at age 68 for screening                            purposes.                           - Return to GI office in 6 weeks. Procedure Code(s):        --- Professional ---  16109, Colonoscopy, flexible; diagnostic, including                            collection of specimen(s) by brushing or washing,                            when performed (separate procedure) Diagnosis Code(s):        --- Professional ---                           K64.0, First degree hemorrhoids                           R93.3, Abnormal findings on diagnostic imaging of                            other parts of digestive tract CPT copyright 2016 American Medical Association. All rights reserved. The codes documented in this report are preliminary and upon coder review may  be revised to meet current compliance requirements. Gerrit Friends. Perez Dirico, MD Gennette Pac, MD 04/07/2017 11:49:34 AM This report has been signed electronically. Number of Addenda: 0

## 2017-04-07 NOTE — Interval H&P Note (Signed)
History and Physical Interval Note:  04/07/2017 11:08 AM  Oneita Hurtasey J Badolato  has presented today for surgery, with the diagnosis of rectal bleeding  The various methods of treatment have been discussed with the patient and family. After consideration of risks, benefits and other options for treatment, the patient has consented to  Procedure(s) with comments: COLONOSCOPY WITH PROPOFOL (N/A) - 2:15pm as a surgical intervention .  The patient's history has been reviewed, patient examined, no change in status, stable for surgery.  I have reviewed the patient's chart and labs.  Questions were answered to the patient's satisfaction.     Garey Alleva  No change. CT reviewed. Has not started Linzess as of yet. Diagnostic colonoscopy per plan. The risks, benefits, limitations, alternatives and imponderables have been reviewed with the patient. Questions have been answered. All parties are agreeable.

## 2017-04-07 NOTE — Transfer of Care (Signed)
Immediate Anesthesia Transfer of Care Note  Patient: Mary Parker  Procedure(s) Performed: Procedure(s) with comments: COLONOSCOPY WITH PROPOFOL (N/A) - 2:15pm  Patient Location: PACU  Anesthesia Type:MAC  Level of Consciousness: awake and patient cooperative  Airway & Oxygen Therapy: Patient Spontanous Breathing  Post-op Assessment: Report given to RN, Post -op Vital signs reviewed and stable and Patient moving all extremities  Post vital signs: Reviewed and stable  Last Vitals:  Vitals:   04/07/17 1100 04/07/17 1105  BP: 105/69 113/70  Pulse:    Resp: 13 16  Temp:      Last Pain:  Vitals:   04/07/17 1042  TempSrc: Oral         Complications: No apparent anesthesia complications

## 2017-04-07 NOTE — Discharge Instructions (Addendum)
Colonoscopy Discharge Instructions  Read the instructions outlined below and refer to this sheet in the next few weeks. These discharge instructions provide you with general information on caring for yourself after you leave the hospital. Your doctor may also give you specific instructions. While your treatment has been planned according to the most current medical practices available, unavoidable complications occasionally occur. If you have any problems or questions after discharge, call Dr. Jena Gauss at [phone]. ACTIVITY  You may resume your regular activity, but move at a slower pace for the next 24 hours.   Take frequent rest periods for the next 24 hours.   Walking will help get rid of the air and reduce the bloated feeling in your belly (abdomen).   No driving for 24 hours (because of the medicine (anesthesia) used during the test).    Do not sign any important legal documents or operate any machinery for 24 hours (because of the anesthesia used during the test).  NUTRITION  Drink plenty of fluids.   You may resume your normal diet as instructed by your doctor.   Begin with a light meal and progress to your normal diet. Heavy or fried foods are harder to digest and may make you feel sick to your stomach (nauseated).   Avoid alcoholic beverages for 24 hours or as instructed.  MEDICATIONS  You may resume your normal medications unless your doctor tells you otherwise.  WHAT YOU CAN EXPECT TODAY  Some feelings of bloating in the abdomen.   Passage of more gas than usual.   Spotting of blood in your stool or on the toilet paper.  IF YOU HAD POLYPS REMOVED DURING THE COLONOSCOPY:  No aspirin products for 7 days or as instructed.   No alcohol for 7 days or as instructed.   Eat a soft diet for the next 24 hours.  FINDING OUT THE RESULTS OF YOUR TEST Not all test results are available during your visit. If your test results are not back during the visit, make an appointment  with your caregiver to find out the results. Do not assume everything is normal if you have not heard from your caregiver or the medical facility. It is important for you to follow up on all of your test results.  SEEK IMMEDIATE MEDICAL ATTENTION IF:  You have more than a spotting of blood in your stool.   Your belly is swollen (abdominal distention).   You are nauseated or vomiting.   You have a temperature over 101.   You have abdominal pain or discomfort that is severe or gets worse throughout the day.     Repeat colonoscopy for screening purposes at age 36  Begin Linzess daily as previously recommended  Hemorrhoid information provided  Office visit with [place] in 6 weeks.    Hemorrhoids Hemorrhoids are swollen veins in and around the rectum or anus. There are two types of hemorrhoids:  Internal hemorrhoids. These occur in the veins that are just inside the rectum. They may poke through to the outside and become irritated and painful.  External hemorrhoids. These occur in the veins that are outside of the anus and can be felt as a painful swelling or hard lump near the anus.  Most hemorrhoids do not cause serious problems, and they can be managed with home treatments such as diet and lifestyle changes. If home treatments do not help your symptoms, procedures can be done to shrink or remove the hemorrhoids. What are the causes? This condition is  caused by increased pressure in the anal area. This pressure may result from various things, including:  Constipation.  Straining to have a bowel movement.  Diarrhea.  Pregnancy.  Obesity.  Sitting for long periods of time.  Heavy lifting or other activity that causes you to strain.  Anal sex.  What are the signs or symptoms? Symptoms of this condition include:  Pain.  Anal itching or irritation.  Rectal bleeding.  Leakage of stool (feces).  Anal swelling.  One or more lumps around the anus.  How is this  diagnosed? This condition can often be diagnosed through a visual exam. Other exams or tests may also be done, such as:  Examination of the rectal area with a gloved hand (digital rectal exam).  Examination of the anal canal using a small tube (anoscope).  A blood test, if you have lost a significant amount of blood.  A test to look inside the colon (sigmoidoscopy or colonoscopy).  How is this treated? This condition can usually be treated at home. However, various procedures may be done if dietary changes, lifestyle changes, and other home treatments do not help your symptoms. These procedures can help make the hemorrhoids smaller or remove them completely. Some of these procedures involve surgery, and others do not. Common procedures include:  Rubber band ligation. Rubber bands are placed at the base of the hemorrhoids to cut off the blood supply to them.  Sclerotherapy. Medicine is injected into the hemorrhoids to shrink them.  Infrared coagulation. A type of light energy is used to get rid of the hemorrhoids.  Hemorrhoidectomy surgery. The hemorrhoids are surgically removed, and the veins that supply them are tied off.  Stapled hemorrhoidopexy surgery. A circular stapling device is used to remove the hemorrhoids and use staples to cut off the blood supply to them.  Follow these instructions at home: Eating and drinking  Eat foods that have a lot of fiber in them, such as whole grains, beans, nuts, fruits, and vegetables. Ask your health care provider about taking products that have added fiber (fiber supplements).  Drink enough fluid to keep your urine clear or pale yellow. Managing pain and swelling  Take warm sitz baths for 20 minutes, 3-4 times a day to ease pain and discomfort.  If directed, apply ice to the affected area. Using ice packs between sitz baths may be helpful. ? Put ice in a plastic bag. ? Place a towel between your skin and the bag. ? Leave the ice on for 20  minutes, 2-3 times a day. General instructions  Take over-the-counter and prescription medicines only as told by your health care provider.  Use medicated creams or suppositories as told.  Exercise regularly.  Go to the bathroom when you have the urge to have a bowel movement. Do not wait.  Avoid straining to have bowel movements.  Keep the anal area dry and clean. Use wet toilet paper or moist towelettes after a bowel movement.  Do not sit on the toilet for long periods of time. This increases blood pooling and pain. Contact a health care provider if:  You have increasing pain and swelling that are not controlled by treatment or medicine.  You have uncontrolled bleeding.  You have difficulty having a bowel movement, or you are unable to have a bowel movement.  You have pain or inflammation outside the area of the hemorrhoids. This information is not intended to replace advice given to you by your health care provider. Make sure you  discuss any questions you have with your health care provider. Document Released: 10/15/2000 Document Revised: 03/17/2016 Document Reviewed: 07/02/2015 Elsevier Interactive Patient Education  2017 [employer]

## 2017-04-07 NOTE — Anesthesia Postprocedure Evaluation (Signed)
Anesthesia Post Note  Patient: MATHILDA MAGUIRE  Procedure(s) Performed: Procedure(s) (LRB): COLONOSCOPY WITH PROPOFOL (N/A)  Patient location during evaluation: PACU Anesthesia Type: MAC Level of consciousness: awake and patient cooperative Pain management: pain level controlled Vital Signs Assessment: post-procedure vital signs reviewed and stable Respiratory status: spontaneous breathing, nonlabored ventilation and respiratory function stable Cardiovascular status: blood pressure returned to baseline Postop Assessment: no signs of nausea or vomiting Anesthetic complications: no     Last Vitals:  Vitals:   04/07/17 1100 04/07/17 1105  BP: 105/69 113/70  Pulse:    Resp: 13 16  Temp:      Last Pain:  Vitals:   04/07/17 1042  TempSrc: Oral                 Chane Cowden J

## 2017-04-07 NOTE — Anesthesia Preprocedure Evaluation (Signed)
Anesthesia Evaluation  Patient identified by MRN, date of birth, ID band Patient awake    Reviewed: Allergy & Precautions, NPO status , Patient's Chart, lab work & pertinent test results  Airway Mallampati: III  TM Distance: <3 FB Neck ROM: Full    Dental  (+) Teeth Intact   Pulmonary Current Smoker,    breath sounds clear to auscultation       Cardiovascular negative cardio ROS   Rhythm:Regular Rate:Normal     Neuro/Psych  Headaches, Seizures -, Well Controlled,  PSYCHIATRIC DISORDERS Anxiety Depression  Neuromuscular disease ( Sciatica of left side)    GI/Hepatic negative GI ROS, Neg liver ROS,   Endo/Other  negative endocrine ROS  Renal/GU negative Renal ROS     Musculoskeletal  (+) Arthritis , Fibromyalgia -  Abdominal   Peds  Hematology   Anesthesia Other Findings   Reproductive/Obstetrics                             Anesthesia Physical Anesthesia Plan  ASA: II  Anesthesia Plan: MAC   Post-op Pain Management:    Induction: Intravenous  PONV Risk Score and Plan:   Airway Management Planned: Simple Face Mask  Additional Equipment:   Intra-op Plan:   Post-operative Plan: Extubation in OR  Informed Consent: I have reviewed the patients History and Physical, chart, labs and discussed the procedure including the risks, benefits and alternatives for the proposed anesthesia with the patient or authorized representative who has indicated his/her understanding and acceptance.     Plan Discussed with:   Anesthesia Plan Comments:         Anesthesia Quick Evaluation

## 2017-04-13 ENCOUNTER — Other Ambulatory Visit (HOSPITAL_COMMUNITY): Payer: Medicaid Other

## 2017-04-13 ENCOUNTER — Encounter (HOSPITAL_COMMUNITY): Payer: Self-pay | Admitting: Internal Medicine

## 2017-04-18 ENCOUNTER — Encounter: Payer: Self-pay | Admitting: Neurology

## 2017-04-18 ENCOUNTER — Other Ambulatory Visit: Payer: Self-pay | Admitting: Neurology

## 2017-04-18 ENCOUNTER — Ambulatory Visit
Admission: RE | Admit: 2017-04-18 | Discharge: 2017-04-18 | Disposition: A | Discharge status: Home / Self Care | Payer: Medicaid Other | Source: Ambulatory Visit | Attending: Neurology | Admitting: Neurology

## 2017-04-18 ENCOUNTER — Other Ambulatory Visit (HOSPITAL_COMMUNITY): Payer: Self-pay

## 2017-04-18 DIAGNOSIS — M533 Sacrococcygeal disorders, not elsewhere classified: Secondary | ICD-10-CM

## 2017-04-18 MED ORDER — PREDNISONE 5 MG PO TABS: ORAL_TABLET | ORAL | 0 refills | Status: DC

## 2017-04-18 MED ORDER — METHYLPREDNISOLONE ACETATE 40 MG/ML INJ SUSP (RADIOLOG
120.0000 mg | Freq: Once | INTRAMUSCULAR | Status: AC
  Administered 2017-04-18: 120 mg via INTRA_ARTICULAR

## 2017-04-18 MED ORDER — IOPAMIDOL (ISOVUE-M 200) INJECTION 41%
1.0000 mL | Freq: Once | INTRAMUSCULAR | Status: AC
  Administered 2017-04-18: 1 mL via INTRA_ARTICULAR

## 2017-04-18 NOTE — Discharge Instructions (Signed)

## 2017-04-19 ENCOUNTER — Other Ambulatory Visit: Payer: Self-pay | Admitting: Neurology

## 2017-04-19 ENCOUNTER — Ambulatory Visit (INDEPENDENT_AMBULATORY_CARE_PROVIDER_SITE_OTHER): Payer: Medicaid Other | Admitting: Adult Health

## 2017-04-19 ENCOUNTER — Encounter: Payer: Self-pay | Admitting: Adult Health

## 2017-04-19 ENCOUNTER — Other Ambulatory Visit (HOSPITAL_COMMUNITY)
Admission: RE | Admit: 2017-04-19 | Discharge: 2017-04-19 | Disposition: A | Discharge status: Home / Self Care | Payer: Medicaid Other | Source: Ambulatory Visit | Attending: Adult Health | Admitting: Adult Health

## 2017-04-19 VITALS — BP 108/70 | HR 76 | Ht 64.5 in | Wt 184.0 lb

## 2017-04-19 DIAGNOSIS — R1032 Left lower quadrant pain: Secondary | ICD-10-CM | POA: Diagnosis not present

## 2017-04-19 DIAGNOSIS — Z124 Encounter for screening for malignant neoplasm of cervix: Secondary | ICD-10-CM

## 2017-04-19 DIAGNOSIS — Z0000 Encounter for general adult medical examination without abnormal findings: Secondary | ICD-10-CM | POA: Diagnosis not present

## 2017-04-19 DIAGNOSIS — Z01419 Encounter for gynecological examination (general) (routine) without abnormal findings: Secondary | ICD-10-CM | POA: Diagnosis not present

## 2017-04-19 DIAGNOSIS — N838 Other noninflammatory disorders of ovary, fallopian tube and broad ligament: Secondary | ICD-10-CM | POA: Insufficient documentation

## 2017-04-19 DIAGNOSIS — N839 Noninflammatory disorder of ovary, fallopian tube and broad ligament, unspecified: Secondary | ICD-10-CM | POA: Diagnosis not present

## 2017-04-19 NOTE — Progress Notes (Addendum)
Patient ID: Mary HurtCasey J Parker, female   DOB: 03-13-1981, 36 y.o.   MRN: [id number] History of Present Illness: Mary Parker is a 36 year old white female in for well woman gyn exam and pap.She has LLQ pain ,is long standing.[facility] and found to have 2.1 cm left adnexa lesion. [place] starting [place]. PCP is Dr Margo AyeHall.   Current Medications, Allergies, Past Medical History, Past Surgical History, Family History and Social History were reviewed in [facility] electronic medical record.     Review of Systems: Patient denies any headaches, hearing loss, fatigue, blurred vision, shortness of breath, chest pain, abdominal pain, problems with bowel movements, urination, or intercourse(not having sex). No joint pain or mood swings. LLQ pain.   Physical Exam:BP 108/70 (BP Location: Right Arm, Patient Position: Sitting, Cuff Size: Normal)   Pulse 76   Ht 5' 4.5" (1.638 m)   Wt 184 lb (83.5 kg)   BMI 31.10 kg/m  General:  Well developed, well nourished, no acute distress Skin:  Warm and dry Neck:  Midline trachea, normal thyroid, good ROM, no lymphadenopathy Lungs; Clear to auscultation bilaterally Breast:  No dominant palpable mass, retraction, or nipple discharge Cardiovascular: Regular rate and rhythm Abdomen:  Soft, non tender, no hepatosplenomegaly Pelvic:  External genitalia is normal in appearance, no lesions.  The vagina is normal in appearance. Urethra has no lesions or masses. The cervix is bulbous. Pap with reflex HPV performed. Uterus is felt to be normal size, shape, and contour.  No adnexal masses,LLQ tenderness noted.Bladder is non tender, no masses felt. Extremities/musculoskeletal:  No swelling or varicosities noted, no clubbing or cyanosis Psych:  No mood changes, alert and cooperative,seems happy PHQ 2 score 1.  Impression: 1. Encounter for gynecological examination with Papanicolaou smear of cervix   2. Routine cervical smear   3. Ovarian mass, left    4. LLQ pain        Plan:  [facility] Pap in 3 years if normal Labs with PCP

## 2017-04-20 ENCOUNTER — Telehealth: Payer: Self-pay | Admitting: *Deleted

## 2017-04-20 MED ORDER — TAPENTADOL HCL 50 MG PO TABS: 50.0000 mg | ORAL_TABLET | Freq: Four times a day (QID) | ORAL | 0 refills | Status: DC | PRN

## 2017-04-20 NOTE — Telephone Encounter (Signed)
The Nucynta will be refilled. 

## 2017-04-20 NOTE — Telephone Encounter (Signed)
Gave printed/signed rx Nucynta to patient up front.

## 2017-04-20 NOTE — Telephone Encounter (Signed)
Patient came to office requesting refill on Nucynta. Spoke to Dr Anne HahnWillis, he will refill.

## 2017-04-21 LAB — CYTOLOGY - PAP
DIAGNOSIS: NEGATIVE
HPV: NOT DETECTED

## 2017-04-22 ENCOUNTER — Ambulatory Visit: Payer: Medicaid Other | Admitting: Gastroenterology

## 2017-04-22 ENCOUNTER — Ambulatory Visit (HOSPITAL_COMMUNITY)
Admission: RE | Admit: 2017-04-22 | Discharge: 2017-04-22 | Disposition: A | Discharge status: Home / Self Care | Payer: Medicaid Other | Source: Ambulatory Visit | Attending: Adult Health | Admitting: Adult Health

## 2017-04-22 ENCOUNTER — Telehealth: Payer: Self-pay | Admitting: *Deleted

## 2017-04-22 DIAGNOSIS — N839 Noninflammatory disorder of ovary, fallopian tube and broad ligament, unspecified: Secondary | ICD-10-CM | POA: Insufficient documentation

## 2017-04-22 DIAGNOSIS — R1032 Left lower quadrant pain: Secondary | ICD-10-CM | POA: Diagnosis present

## 2017-04-22 DIAGNOSIS — N83291 Other ovarian cyst, right side: Secondary | ICD-10-CM | POA: Insufficient documentation

## 2017-04-22 DIAGNOSIS — N83292 Other ovarian cyst, left side: Secondary | ICD-10-CM | POA: Insufficient documentation

## 2017-04-22 DIAGNOSIS — N838 Other noninflammatory disorders of ovary, fallopian tube and broad ligament: Secondary | ICD-10-CM

## 2017-04-22 NOTE — Telephone Encounter (Signed)
Dr Anne HahnWillis- I submitted PA on Nucynta but it got denied, d/t needing updated notes with why patient needs continued treatment with medication and plan of care for patient. How would you like to proceed? I can re-submit if you want to update this in your note.

## 2017-04-22 NOTE — Telephone Encounter (Signed)
I will make an addendum to my most recent revisit note.

## 2017-04-25 ENCOUNTER — Telehealth: Payer: Self-pay | Admitting: Adult Health

## 2017-04-25 ENCOUNTER — Encounter: Payer: Self-pay | Admitting: Adult Health

## 2017-04-25 DIAGNOSIS — N83201 Unspecified ovarian cyst, right side: Secondary | ICD-10-CM

## 2017-04-25 DIAGNOSIS — N83299 Other ovarian cyst, unspecified side: Secondary | ICD-10-CM

## 2017-04-25 HISTORY — DX: Unspecified ovarian cyst, right side: N83.201

## 2017-04-25 NOTE — Telephone Encounter (Signed)
Re-submitted with supporting documentation. Awaiting response.

## 2017-04-25 NOTE — Telephone Encounter (Signed)
Pt aware US shows simple cyst on left but complex cyst on right, will check CA 125 and she will call back top schedule F/U US in 6 weeks

## 2017-04-25 NOTE — Telephone Encounter (Signed)
PA approved. Called and spoke with [employer]. Interaction ID # for call: [id number].

## 2017-05-06 LAB — CA 125: CANCER ANTIGEN (CA) 125: 7 U/mL (ref 0.0–38.1)

## 2017-05-15 ENCOUNTER — Other Ambulatory Visit: Payer: Self-pay | Admitting: Neurology

## 2017-05-16 ENCOUNTER — Other Ambulatory Visit: Payer: Self-pay | Admitting: Neurology

## 2017-05-16 MED ORDER — TIZANIDINE HCL 2 MG PO TABS: ORAL_TABLET | ORAL | 1 refills | Status: DC

## 2017-05-17 ENCOUNTER — Encounter: Payer: Self-pay | Admitting: Neurology

## 2017-05-18 ENCOUNTER — Other Ambulatory Visit: Payer: Self-pay | Admitting: Neurology

## 2017-05-18 MED ORDER — MILNACIPRAN HCL 12.5 MG PO TABS: 12.5000 mg | ORAL_TABLET | Freq: Every day | ORAL | 1 refills | Status: DC

## 2017-05-23 ENCOUNTER — Telehealth: Payer: Self-pay | Admitting: *Deleted

## 2017-05-23 NOTE — Telephone Encounter (Signed)
Called and spoke with Camile from [employer] at [phone].  Interaction ID for call: [id number]  Patient has tried failed: nortriptyline, gabapentin, pregabalin, tizanidine, ibuprofen, nucynta, duloxetine, meloxicam, amitriptyline, sertraline.   PA approved effective 05/23/17-05/18/18. NW#[id number]#[id number].   Called [facility] at [phone] and notified them of PA approval. Spoke with April. She verbalized understanding.

## 2017-06-02 ENCOUNTER — Telehealth: Payer: Self-pay | Admitting: Adult Health

## 2017-06-02 NOTE — Telephone Encounter (Signed)
Pt called stating that she has a cyst on her Left ovary and the pain is getting worse. She states "it is killing her". She states that it hurts really bad when sitting and makes it hard to go to the bathroom. She states that she constantly feels like she needs to pee because of the pressure of this cyst. She states that she does not have a UTI. I informed pt that Victorino DikeJennifer wanted to do a F/U u/s in 6 weeks, which would be next week. Pt transferred to scheduling.

## 2017-06-02 NOTE — Telephone Encounter (Signed)
Patient called stating that she is in a lot of pain, Pt states that her right ovary has been hurting. Pt states that she would like to speak with Victorino DikeJennifer. Pt states that she is having trouble urinating. Please contact pt

## 2017-06-06 ENCOUNTER — Other Ambulatory Visit: Payer: Self-pay | Admitting: Adult Health

## 2017-06-06 DIAGNOSIS — R1032 Left lower quadrant pain: Secondary | ICD-10-CM

## 2017-06-07 ENCOUNTER — Encounter: Payer: Self-pay | Admitting: Neurology

## 2017-06-08 ENCOUNTER — Ambulatory Visit (INDEPENDENT_AMBULATORY_CARE_PROVIDER_SITE_OTHER): Payer: Medicaid Other | Admitting: Adult Health

## 2017-06-08 ENCOUNTER — Ambulatory Visit (INDEPENDENT_AMBULATORY_CARE_PROVIDER_SITE_OTHER): Payer: Medicaid Other

## 2017-06-08 VITALS — BP 100/80 | HR 80 | Ht 63.0 in | Wt 189.0 lb

## 2017-06-08 DIAGNOSIS — N736 Female pelvic peritoneal adhesions (postinfective): Secondary | ICD-10-CM

## 2017-06-08 DIAGNOSIS — Z8742 Personal history of other diseases of the female genital tract: Secondary | ICD-10-CM | POA: Diagnosis not present

## 2017-06-08 DIAGNOSIS — R1032 Left lower quadrant pain: Secondary | ICD-10-CM

## 2017-06-09 ENCOUNTER — Telehealth: Payer: Self-pay | Admitting: Adult Health

## 2017-06-09 ENCOUNTER — Encounter: Payer: Self-pay | Admitting: Adult Health

## 2017-06-09 DIAGNOSIS — Z8742 Personal history of other diseases of the female genital tract: Secondary | ICD-10-CM | POA: Insufficient documentation

## 2017-06-09 DIAGNOSIS — N736 Female pelvic peritoneal adhesions (postinfective): Secondary | ICD-10-CM | POA: Insufficient documentation

## 2017-06-09 NOTE — Telephone Encounter (Signed)
Pt aware can't rx toradol due to allergy to meloxicam, appt made for next week

## 2017-06-09 NOTE — Progress Notes (Signed)
Subjective:     Patient ID: Mary Parker, female   DOB: 1981/04/20, 36 y.o.   MRN: [id number]  HPI Mary Parker is a 36 year old white female in for US and to discuss results, has history of left ovarian cyst and pain in LLQ.  Review of Systems Pain LLQ History of left ovarian cyst Reviewed past medical,surgical, social and family history. Reviewed medications and allergies.     Objective:   Physical Exam BP 100/80   Pulse 80   Ht 5\' 3"  (1.6 m)   Wt 189 lb (85.7 kg)   BMI 33.48 kg/m   Reviewed US results: 2 simple cyst in myometrium and EEC 1.9 mm, both ovaries normal, no cysts but left ovary appears adhered to uterus and is probably source of her pain, discussed surgical options and she wants to proceed.    Assessment:     1. Pelvic adhesions   2. LLQ pain   3. History of ovarian cyst       Plan:    Return in 1 week to discuss surgical options with Dr Emelda FearFerguson

## 2017-06-09 NOTE — Progress Notes (Signed)
PELVIC US TA/TV: anteverted uterus with two posterior simple myometrial cysts (#1) 1.5 x 1.4 x 1.6 cm,(#2) .9 x 1 x 1 cm,normal ovaries bilat,left ovary appears to be adhered to the uterus,unable to slide left ovary w/probe pressure,left adnexal discomfort during ultrasound,EEC 1.9 mm,no free fluid

## 2017-06-09 NOTE — Patient Instructions (Signed)
Return in 1 week to talk with Dr Emelda FearFerguson about surgery

## 2017-06-10 ENCOUNTER — Other Ambulatory Visit: Payer: Self-pay | Admitting: Neurology

## 2017-06-10 MED ORDER — TAPENTADOL HCL 50 MG PO TABS: 50.0000 mg | ORAL_TABLET | Freq: Four times a day (QID) | ORAL | 0 refills | Status: DC | PRN

## 2017-06-15 ENCOUNTER — Ambulatory Visit (INDEPENDENT_AMBULATORY_CARE_PROVIDER_SITE_OTHER): Payer: Medicaid Other | Admitting: Obstetrics and Gynecology

## 2017-06-15 ENCOUNTER — Encounter: Payer: Self-pay | Admitting: Obstetrics and Gynecology

## 2017-06-15 VITALS — BP 90/60 | HR 80 | Ht 63.0 in | Wt 189.2 lb

## 2017-06-15 DIAGNOSIS — N857 Hematometra: Secondary | ICD-10-CM

## 2017-06-15 DIAGNOSIS — N736 Female pelvic peritoneal adhesions (postinfective): Secondary | ICD-10-CM

## 2017-06-15 NOTE — Progress Notes (Signed)
Preoperative History and Physical  Mary Parker is a 36 y.o. G3P3 here for surgical management of pelvic pain and pelvic adhesions. No significant preoperative concerns. The patient is status post endometrial ablation 13 years ago. She still has cramping discomfort H monthly feels a little bit like a period, but no bleeding per vagina. She is status post tubal sterilization as well as endometrial ablation. She does experience pain in the left lower quadrant and out evaluation today did show normal Pap smear within the right ovary to be mobile and the left ovary did appear to be adherent to adjacent structures Proposed surgery: Abdominal Supracervical Hysterectomy, bilateral salpingectomy possible left oophorectomy  Past Medical History:  Diagnosis Date  . Anxiety   . Arthritis    both knees  . Body aches 12/11/2014  . Chronic insomnia   . Chronic low back pain 05/29/2015  . Common migraine 12/19/2014  . Depression 12/11/2014  . Fibrocystic disease of both breasts   . Fibromyalgia 12/19/2014  . IBS (irritable bowel syndrome)   . Migraines   . Rebound headache 12/19/2014  . Right ovarian cyst 04/25/2017   Complex, check CA 125 and repeat US in 6 weeks   . Sciatica of left side   . Seizures (HCC)    remote past, felt it was related to Chantix   . Vitamin D deficiency    Past Surgical History:  Procedure Laterality Date  . CESAREAN SECTION  [phone]  . CHOLECYSTECTOMY  2001  . COLONOSCOPY WITH PROPOFOL N/A 04/07/2017   Procedure: COLONOSCOPY WITH PROPOFOL;  Surgeon: Corbin Ade, MD;  Location: [facility];  Service: Endoscopy;  Laterality: N/A;  2:15pm  . ENDOMETRIAL ABLATION    . TUBAL LIGATION     OB History  Gravida Para Term Preterm AB Living  3 3       3   SAB TAB Ectopic Multiple Live Births               # Outcome Date GA Lbr Len/2nd Weight Sex Delivery Anes PTL Lv  3 Para           2 Para           1 Para             Patient denies any other pertinent  gynecologic issues.   Current Outpatient Prescriptions on File Prior to Visit  Medication Sig Dispense Refill  . traZODone (DESYREL) 100 MG tablet TAKE 1 TABLET(100 MG) BY MOUTH AT BEDTIME 30 tablet 5  . ALPRAZolam (XANAX) 0.5 MG tablet Take 1 tablet (0.5 mg total) by mouth 2 (two) times daily as needed for anxiety. (Patient not taking: Reported on 06/15/2017) 30 tablet 0  . butalbital-acetaminophen-caffeine (FIORICET, ESGIC) 50-325-40 MG per tablet Take 1 tablet by mouth every 4 (four) hours as needed for migraine.      No current facility-administered medications on file prior to visit.    Allergies  Allergen Reactions  . Chantix [Varenicline] Other (See Comments)    Seizure   . Wellbutrin [Bupropion] Other (See Comments)    Suicidal ideation  . Ciprofloxacin Hives, Swelling and Other (See Comments)    Sweating, dizziness  . Cymbalta [Duloxetine Hcl] Hives, Itching and Rash  . Meloxicam Other (See Comments)    Abdominal cramping and mood changes   . Topamax [Topiramate] Swelling and Other (See Comments)    Dizziness and slurred speech  . Milnacipran Other (See Comments)    Leg cramps  . Gabapentin  Itching  . Lyrica [Pregabalin] Other (See Comments)    Tremors   . Nortriptyline Other (See Comments)    GERD    Social History:   reports that she has been smoking Cigarettes.  She has a 30.00 pack-year smoking history. She has never used smokeless tobacco. She reports that she does not drink alcohol or use drugs.  Family History  Problem Relation Age of Onset  . Hypertension Mother   . Other Mother        abnormal cells; had hyst  . Obesity Daughter   . Diabetes Daughter 3013       youngest  . Obesity Son   . Cancer Maternal Grandmother   . Osteoporosis Maternal Grandmother   . Cancer Maternal Grandfather   . Obesity Daughter   . Diabetes Father   . Hypertension Father   . Alcohol abuse Father   . Colon cancer Neg Hx     Review of Systems: Noncontributory  PHYSICAL  EXAM: Blood pressure 90/60, pulse 80, height 5\' 3"  (1.6 m), weight 189 lb 3.2 oz (85.8 kg). General appearance - alert, well appearing, and in no distress Chest - clear to auscultation, no wheezes, rales or rhonchi, symmetric air entry Heart - normal rate and regular rhythm Abdomen - soft, nontender, nondistended, no masses or organomegaly, well healed c-section scar, moderately obese Pelvic - normal external genitalia, vulva, vagina, cervix, uterus and adnexa,  VULVA:  VAGINA:   CERVIX: well supported  UTERUS:  ADNEXA: 5/10 pain in left adnexa reciprociting pt's pain in left tube and ovary, 1.5 cm nabothian cyst in lower segment{ Extremities - peripheral pulses normal, no pedal edema, no clubbing or cyanosis Bladder: mild 2/10 pain in bladder area   Collecting fluid in the center of the uterus hematometra Transvaginal ultrasound shows an immobile left ovary that appears to be adherent to the side of the uterus Cramping 2-3 days no bleeding    Discussion of risks/benefits of hysterectomy as noted below: 1. Discussed supracervical vs total abdominal hysterectomy. Discussed benefits of forgoing cervix removal, including vaginal lubrication, and risks of forgoing cervix removal, including cervical cancer. Discussed reduced risk of ovarian cancer with oophorectomy from 1 in 100 to 1 in 300. Discussed benefits of salpingectomy with oophorectomy, including further reduced risk of cancer, and without oophorectomy, including avoiding hormone therapy. Given medical explainer review of hysterectomy.   At end of discussion, pt had opportunity to ask questions and has no further questions at this time.   Specific discussion of hysterectomy as noted above. Greater than 50% was spent in counseling and coordination of care with the patient.   Total time greater than: I personally performed the services described in this documentation, which was SCRIBED in my presence. The recorded information has been  reviewed and considered accurate. It has been edited as necessary during review. Tilda BurrowFERGUSON,Haydee Jabbour V, MD   minutes.     Labs: No results found for this or any previous visit (from the past 336 hour(s)).  Imaging Studies: [place] Transvaginal Non-ob  Result Date: 06/10/2017 GYNECOLOGIC SONOGRAM Mary Parker is a 36 y.o. G3P3 s/p ablation,she is here for a pelvic sonogram for LLQ pain. Uterus                      6.4 x 3.3 x 4.6 cm, anteverted uterus with two posterior simple myometrial cysts (#1) 1.5 x 1.4 x 1.6 cm,(#2) .9 x 1 x 1 cm Endometrium  1.9 mm, symmetrical, wnl Right ovary             2.2 x 1.5 x 2.7 cm, wnl Left ovary                2.3 x 1.1 x 2.1 cm, wnl,ovary appears to be adhered to the uterus,unable to slide left ovary w/probe pressure No free fluid Technician Comments: PELVIC US TA/TV:anteverted uterus with two posterior simple myometrial cysts (#1) 1.5 x 1.4 x 1.6 cm,(#2) .9 x 1 x 1 cm,normal ovaries bilat,left ovary appears to be adhered to the uterus,unable to slide left ovary w/probe pressure,left adnexal discomfort during ultrasound,EEC 1.9 mm,no free fluid Mary Parker 06/09/2017 8:21 AM Clinical Impression and recommendations: I have reviewed the sonogram results above. Combined with the patient's current clinical course, below are my impressions and any appropriate recommendations for management based on the sonographic findings: 1. Small uterus with a 1.5 cm cystic fluid collection within the uterus , likely a endometrial fluid collection s/p ablation. No suspicious features. 2. Normal sized left ovary, with some discomfort due to efforts to manipulate the ovary by u/s. 3 followup will be based on severity of patient symptoms. Ladaysha Soutar V   US Pelvis Complete  Result Date: 06/10/2017 GYNECOLOGIC SONOGRAM GEOVANA GEBEL is a 36 y.o. G3P3 s/p ablation,she is here for a pelvic sonogram for LLQ pain. Uterus                      6.4 x 3.3 x 4.6 cm, anteverted uterus with two  posterior simple myometrial cysts (#1) 1.5 x 1.4 x 1.6 cm,(#2) .9 x 1 x 1 cm Endometrium          1.9 mm, symmetrical, wnl Right ovary             2.2 x 1.5 x 2.7 cm, wnl Left ovary                2.3 x 1.1 x 2.1 cm, wnl,ovary appears to be adhered to the uterus,unable to slide left ovary w/probe pressure No free fluid Technician Comments: PELVIC US TA/TV:anteverted uterus with two posterior simple myometrial cysts (#1) 1.5 x 1.4 x 1.6 cm,(#2) .9 x 1 x 1 cm,normal ovaries bilat,left ovary appears to be adhered to the uterus,unable to slide left ovary w/probe pressure,left adnexal discomfort during ultrasound,EEC 1.9 mm,no free fluid Mary Parker 06/09/2017 8:21 AM Clinical Impression and recommendations: I have reviewed the sonogram results above. Combined with the patient's current clinical course, below are my impressions and any appropriate recommendations for management based on the sonographic findings: 1. Small uterus with a 1.5 cm cystic fluid collection within the uterus , likely a endometrial fluid collection s/p ablation. No suspicious features. 2. Normal sized left ovary, with some discomfort due to efforts to manipulate the ovary by u/s. 3 followup will be based on severity of patient symptoms. Mary Parker V    Assessment: Patient Active Problem List   Diagnosis Date Noted  . Pelvic adhesions 06/09/2017  . History of ovarian cyst 06/09/2017  . Right ovarian cyst 04/25/2017  . Ovarian mass, left 04/19/2017  . Encounter for gynecological examination with Papanicolaou smear of cervix 04/19/2017  . Rectal bleeding 04/02/2017  . LLQ pain 04/01/2017  . Constipation 04/01/2017  . Concussion with loss of consciousness 11/16/2016  . Anxiety 10/14/2016  . Chronic low back pain 05/29/2015  . Common migraine 12/19/2014  . Fibromyalgia 12/19/2014  . Rebound headache 12/19/2014  .  Depression 12/11/2014  . Body aches 12/11/2014    Plan: Patient will undergo surgical management with abdominal  supracervical hysterectomy. Will call with surgery schedule.  Tilda Burrow, MD 06/15/2017 2:43 PM

## 2017-06-17 NOTE — Patient Instructions (Signed)
Mary Parker  06/17/2017     @[email or web address] @   Your procedure is scheduled on  06/21/2017   Report to [facility] at  830  A.M.  Call this number if you have problems the morning of surgery:  [phone]   Remember:  Do not eat food or drink liquids after midnight.  Take these medicines the morning of surgery with A SIP OF WATER  Xanax, fioricet, chantix.   Do not wear jewelry, make-up or nail polish.  Do not wear lotions, powders, or perfumes, or deoderant.  Do not shave 48 hours prior to surgery.  Men may shave face and neck.  Do not bring valuables to the hospital.  [facility] is not responsible for any belongings or valuables.  Contacts, dentures or bridgework may not be worn into surgery.  Leave your suitcase in the car.  After surgery it may be brought to your room.  For patients admitted to the hospital, discharge time will be determined by your treatment team.  Patients discharged the day of surgery will not be allowed to drive home.   Name and phone number of your driver:   daughter Special instructions:  Follow the diet and prep instructions given to you by Dr Rayna Sexton office.  Please read over the following fact sheets that you were given. Pain Booklet, Coughing and Deep Breathing, Blood Transfusion Information, Lab Information, MRSA Information, Surgical Site Infection Prevention, Anesthesia Post-op Instructions and Care and Recovery After Surgery       Unilateral Salpingo-Oophorectomy Unilateral salpingo-oophorectomy is the surgical removal of one fallopian tube and ovary. The ovaries are small organs that produce eggs in women. The fallopian tubes transport the egg from the ovary to the womb (uterus). A unilateral salpingo-oophorectomy may be done for various reasons, including:  Infection in the fallopian tube and ovary.  Scar tissue in the fallopian tube and ovary (adhesions).  A cyst or tumor on the ovary.  A need to  remove the fallopian tube and ovary when removing the uterus.  Cancer of the fallopian tube or ovary.  The removal of one fallopian tube and ovary will not prevent you from becoming pregnant, put you into menopause, or cause problems with your menstrual periods or sex drive. Tell a health care provider about:  Any allergies you have.  All medicines you are taking, including vitamins, herbs, eye drops, creams, and over-the-counter medicines.  Previous problems you or family members have had with the use of anesthetics.  Any blood disorders you have.  Previous surgeries you have had.  Any medical conditions you have. What are the risks? Generally, this is a safe procedure. However, as with any procedure, complications can occur. Possible complications include:  Injury to surrounding organs.  Bleeding.  Infection.  Blood clots in the legs or lungs.  Problems related to anesthesia.  What happens before the procedure?  Ask your health care provider about changing or stopping your regular medicines. You may need to stop taking certain medicines, such as aspirin or blood thinners, at least 1 week before the surgery.  Do not eat or drink anything for at least 8 hours before the surgery.  If you smoke, do not smoke for at least 2 weeks before the surgery.  Make plans to have someone drive you home after the procedure or after your hospital stay. Also arrange for someone to help you with activities during recovery. What happens  during the procedure?  You will be given medicine to help you relax before the procedure (sedative). You will then be given medicine to make you sleep through the procedure (general anesthetic). These medicines will be given through an IV access tube that is put into one of your veins.  Once you are asleep, your lower abdomen will be shaved and cleaned. A thin, flexible tube (catheter) will be placed in your bladder.  The surgeon may use a laparoscopic,  robotic, or open technique for this surgery: ? In the laparoscopic technique, the surgery is done through two small cuts (incisions) in the abdomen. A thin, lighted tube with a tiny camera on the end (laparoscope) is inserted into one of the incisions. The tools needed for the procedure are put through the other incision. ? A robotic technique may be chosen to perform complex surgery in a small space. In the robotic technique, small incisions are made. A camera and surgical instruments are passed through the incisions. Surgical instruments are controlled with the help of a robotic arm. ? In the open technique, the surgery is done through one large incision in the abdomen.  Using any of these techniques, the surgeon will remove the fallopian tube and ovary. The blood vessels will be clamped and tied.  The surgeon will then use staples or stitches to close the incision or incisions. What happens after the procedure?  You will be taken to a recovery area where your progress will be monitored for 1-3 hours. Your blood pressure, pulse, and temperature will be checked often. You will remain in the recovery area until you are stable and waking up.  If the laparoscopic technique was used, you may be allowed to go home after several hours. You may have some shoulder pain. This is normal and usually goes away in a day or two.  If the open technique was used, you will be admitted to the hospital for a couple of days.  You will be given pain medicine as necessary.  The IV tube and catheter will be removed before you are discharged. This information is not intended to replace advice given to you by your health care provider. Make sure you discuss any questions you have with your health care provider. Document Released: 08/15/2009 Document Revised: 09/17/2016 Document Reviewed: 04/11/2013 Elsevier Interactive Patient Education  2018 [employer].  Salpingectomy Salpingectomy, also called tubectomy, is the  surgical removal of one of the fallopian tubes. The fallopian tubes are where eggs travel from the ovaries to the uterus. Removing one fallopian tube does not prevent you from becoming pregnant. It also does not cause problems with your menstrual periods. You may need a salpingectomy if you:  Have a fertilized egg that attaches to the fallopian tube (ectopic pregnancy), especially one that causes the tube to burst or tear (rupture).  Have an infected fallopian tube.  Have cancer of the fallopian tube or nearby organs.  Have had an ovary removed due to a cyst or tumor.  Have had your uterus removed.  There are three different methods that can be used for a salpingectomy:  Open. This method involves making one large incision in your abdomen.  Laparoscopic. This method involves using a thin, lighted tube with a tiny camera on the end (laparoscope) to help perform the procedure. The laparoscope will allow your surgeon to make several small incisions in the abdomen instead of a large incision.  Robot-assisted: This method involves using a computer to control surgical instruments that  are attached to robotic arms.  Tell a health care provider about:  Any allergies you have.  All medicines you are taking, including vitamins, herbs, eye drops, creams, and over-the-counter medicines.  Any problems you or family members have had with anesthetic medicines.  Any blood disorders you have.  Any surgeries you have had.  Any medical conditions you have.  Whether you are pregnant or may be pregnant. What are the risks? Generally, this is a safe procedure. However, problems may occur, including:  Infection.  Bleeding.  Allergic reactions to medicines.  Damage to other structures or organs.  Blood clots in the legs or lungs.  What happens before the procedure? Staying hydrated Follow instructions from your health care provider about hydration, which may include:  Up to 2 hours before  the procedure - you may continue to drink clear liquids, such as water, clear fruit juice, black coffee, and plain tea.  Eating and drinking restrictions Follow instructions from your health care provider about eating and drinking, which may include:  8 hours before the procedure - stop eating heavy meals or foods such as meat, fried foods, or fatty foods.  6 hours before the procedure - stop eating light meals or foods, such as toast or cereal.  6 hours before the procedure - stop drinking milk or drinks that contain milk.  2 hours before the procedure - stop drinking clear liquids.  Medicines  Ask your health care provider about: ? Changing or stopping your regular medicines. This is especially important if you are taking diabetes medicines or blood thinners. ? Taking medicines such as aspirin and ibuprofen. These medicines can thin your blood. Do not take these medicines before your procedure if your health care provider instructs you not to.  You may be given antibiotic medicine to help prevent infection. General instructions  Do not smoke for at least 2 weeks before your procedure. If you need help quitting, ask your health care provider.  You may have an exam or tests, such as an electrocardiogram (ECG).  You may have a blood or urine sample taken.  Ask your health care provider: ? Whether you should stop removing hair from your surgical area. ? How your surgical site will be marked or identified.  You may be asked to shower with a germ-killing soap.  Plan to have someone take you home from the hospital or clinic.  If you will be going home right after the procedure, plan to have someone with you for 24 hours. What happens during the procedure?  To reduce your risk of infection: ? Your health care team will wash or sanitize their hands. ? Hair may be removed from the surgical area. ? Your skin will be washed with soap.  An IV tube will be inserted into one of your  veins.  You will be given a medicine to make you fall asleep (general anesthetic). You may also be given a medicine to help you relax (sedative).  A thin tube (catheter) may be inserted through your urethra and into your bladder to drain urine during your procedure.  Depending on the type of procedure you are having, one incision or several small incisions will be made in your abdomen.  Your fallopian tube will be cut and removed from where it attaches to your uterus.  Your blood vessels will be clamped and tied to prevent excess bleeding.  The incision(s) in your abdomen will be closed with stitches (sutures), staples, or skin glue.  A  bandage (dressing) may be placed over your incision(s). The procedure may vary among health care providers and hospitals. What happens after the procedure?  Your blood pressure, heart rate, breathing rate, and blood oxygen level will be monitored until the medicines you were given have worn off.  You may continue to receive fluids and medicines through an IV tube.  You may continue to have a catheter draining your urine.  You may have to wear compression stockings. These stockings help to prevent blood clots and reduce swelling in your legs.  You will be given pain medicine as needed.  Do not drive for 24 hours if you received a sedative. Summary  Salpingectomy is a surgical procedure to remove one of the fallopian tubes.  The procedure may be done with an open incision, with a laparoscope, or with computer-controlled instruments.  Depending on the type of procedure you are having, one incision or several small incisions will be made in your abdomen.  Your blood pressure, heart rate, breathing rate, and blood oxygen level will be monitored until the medicines you were given have worn off.  Plan to have someone take you home from the hospital or clinic. This information is not intended to replace advice given to you by your health care provider.  Make sure you discuss any questions you have with your health care provider. Document Released: 03/06/2009 Document Revised: 06/04/2016 Document Reviewed: 04/11/2013 Elsevier Interactive Patient Education  2018 Else[employer] Inc. Supracervical Hysterectomy A supracervical hysterectomy is surgery to remove the top part of the uterus, but not the cervix. You will no longer have menstrual periods or be able to get pregnant after this surgery. The fallopian tubes and ovaries may also be removed (bilateral salpingo-oophorectomy) during this surgery. This surgery is usually performed using a minimally invasive technique called laparoscopy. This technique allows the surgery to be done through small incisions. The minimally invasive technique provides benefits such as less pain, less risk of infection, and shorter recovery time. Tell a health care provider about:  Any allergies you have.  All medicines you are taking, including vitamins, herbs, eye drops, creams, and over-the-counter medicines.  Any problems you or family members have had with anesthetic medicines.  Any blood disorders you have.  Any surgeries you have had.  Any medical conditions you have. What are the risks? Generally, this is a safe procedure. However, as with any procedure, complications can occur. Possible complications include:  Bleeding.  Blood clots in the legs or lung.  Infection.  Injury to surrounding organs.  Problems related to anesthesia.  Conversion to an open abdominal surgery.  Additional surgery later to remove the cervix if you have problems with the cervix.  What happens before the procedure?  Ask your health care provider about changing or stopping your regular medicines.  Do not take aspirin or blood thinners (anticoagulants) for 1 week before the surgery, or as directed by your health care provider.  Do not eat or drink anything for 8 hours before the surgery, or as directed by your health care  provider.  Quit smoking if you smoke.  Arrange for a ride home after surgery and for someone to help you at home during recovery. What happens during the procedure?  You will be given an antibiotic medicine.  An IV tube will be placed in one of your veins. You will be given medicine to make you sleep (general anesthetic).  A gas (carbon dioxide) will be used to inflate your abdomen. This will allow  your surgeon to look inside your abdomen, perform your surgery, and treat any other problems found if necessary.  Three or four small incisions will be made in your abdomen. One of these incisions will be made in the area of your belly button (navel). A thin, flexible tube with a tiny camera and light on the end of it (laparoscope) will be inserted into the incision. The camera on the laparoscope sends a picture to a TV screen in the operating room. This gives your surgeon a good view inside the abdomen.  Other surgical instruments will be inserted through the other incisions.  The uterus will be cut into small pieces and removed through the small incisions.  Your incisions will be closed. What happens after the procedure?  You will be taken to a recovery area where your progress will be monitored until you are awake, stable, and taking fluids well. If there are no other problems, you will then be moved to a regular hospital room, or you will be allowed to go home.  You will likely have minimal discomfort after the surgery because the incisions are so small with the laparoscopic technique.  You will be given pain medicine while you are in the hospital and for when you go home.  If a bilateral salpingo-oophorectomy was performed before menopause, you will go through a sudden (abrupt) menopause. This can be helped with hormone medicines. This information is not intended to replace advice given to you by your health care provider. Make sure you discuss any questions you have with your health care  provider. Document Released: 04/05/2008 Document Revised: 03/25/2016 Document Reviewed: 04/20/2013 Elsevier Interactive Patient Education  2018 [employer]. Supracervical Hysterectomy, Care After Refer to this sheet in the next few weeks. These instructions provide you with information on caring for yourself after your procedure. Your health care provider may also give you more specific instructions. Your treatment has been planned according to current medical practices, but problems sometimes occur. Call your health care provider if you have any problems or questions after your procedure. What can I expect after the procedure? After your procedure, it is typical to have some discomfort, tenderness, swelling, and bruising at the surgical sites. This normally lasts for about 2 weeks. Follow these instructions at home:  Get plenty of rest and sleep.  Only take over-the-counter or prescription medicines as directed by your health care provider.  Do not take aspirin. It can cause bleeding.  Do not drive until your health care provider approves.  Follow your health care provider's advice regarding exercise, lifting, and general activities.  Resume your usual diet as directed by your health care provider.  Do not douche, use tampons, or have sexual intercourse for at least 6 weeks or until your health care provider gives you permission.  Change your bandages (dressings) only as directed by your health care provider.  Monitor your temperature.  Take showers instead of baths for 2-3 weeks or as directed by your health care provider.  Drink enough fluids to keep your urine clear or pale yellow.  Do not drink alcohol until your health care provider gives you permission.  If you are constipated, you may take a mild laxative if your health care provider approves. Bran foods may also help with constipation problems.  Try to have someone home with you for 1-2 weeks to help with  activities.  Follow up with your health care provider as directed. Contact a health care provider if:  You have swelling,  redness, or increasing pain in the incision area.  You have pus coming from an incision.  You notice a bad smell coming from the incision or dressing.  You have swelling, redness, or pain in the area around the IV site.  Your incision breaks open.  You feel dizzy or lightheaded.  You have pain or bleeding when you urinate.  You have persistent diarrhea.  You have persistent nausea and vomiting.  You have abnormal vaginal discharge.  You have a rash.  Your pain is not controlled with your prescribed medicine. Get help right away if:  You have a fever.  You have severe abdominal pain.  You have chest pain.  You have shortness of breath.  You faint.  You have pain, swelling, or redness in your leg.  You have heavy vaginal bleeding with blood clots. This information is not intended to replace advice given to you by your health care provider. Make sure you discuss any questions you have with your health care provider. Document Released: 08/08/2013 Document Revised: 03/25/2016 Document Reviewed: 04/20/2013 Elsevier Interactive Patient Education  2018 [employer].  General Anesthesia, Adult General anesthesia is the use of medicines to make a person "go to sleep" (be unconscious) for a medical procedure. General anesthesia is often recommended when a procedure:  Is long.  Requires you to be still or in an unusual position.  Is major and can cause you to lose blood.  Is impossible to do without general anesthesia.  The medicines used for general anesthesia are called general anesthetics. In addition to making you sleep, the medicines:  Prevent pain.  Control your blood pressure.  Relax your muscles.  Tell a health care provider about:  Any allergies you have.  All medicines you are taking, including vitamins, herbs, eye drops,  creams, and over-the-counter medicines.  Any problems you or family members have had with anesthetic medicines.  Types of anesthetics you have had in the past.  Any bleeding disorders you have.  Any surgeries you have had.  Any medical conditions you have.  Any history of heart or lung conditions, such as heart failure, sleep apnea, or chronic obstructive pulmonary disease (COPD).  Whether you are pregnant or may be pregnant.  Whether you use tobacco, alcohol, marijuana, or street drugs.  Any history of Financial [employer].  Any history of depression or anxiety. What are the risks? Generally, this is a safe procedure. However, problems may occur, including:  Allergic reaction to anesthetics.  Lung and heart problems.  Inhaling food or liquids from your stomach into your lungs (aspiration).  Injury to nerves.  Waking up during your procedure and being unable to move (rare).  Extreme agitation or a state of mental confusion (delirium) when you wake up from the anesthetic.  Air in the bloodstream, which can lead to stroke.  These problems are more likely to develop if you are having a major surgery or if you have an advanced medical condition. You can prevent some of these complications by answering all of your health care provider's questions thoroughly and by following all pre-procedure instructions. General anesthesia can cause side effects, including:  Nausea or vomiting  A sore throat from the breathing tube.  Feeling cold or shivery.  Feeling tired, washed out, or achy.  Sleepiness or drowsiness.  Confusion or agitation.  What happens before the procedure? Staying hydrated Follow instructions from your health care provider about hydration, which may include:  Up to 2 hours before the procedure - you  may continue to drink clear liquids, such as water, clear fruit juice, black coffee, and plain tea.  Eating and drinking restrictions Follow instructions from  your health care provider about eating and drinking, which may include:  8 hours before the procedure - stop eating heavy meals or foods such as meat, fried foods, or fatty foods.  6 hours before the procedure - stop eating light meals or foods, such as toast or cereal.  6 hours before the procedure - stop drinking milk or drinks that contain milk.  2 hours before the procedure - stop drinking clear liquids.  Medicines  Ask your health care provider about: ? Changing or stopping your regular medicines. This is especially important if you are taking diabetes medicines or blood thinners. ? Taking medicines such as aspirin and ibuprofen. These medicines can thin your blood. Do not take these medicines before your procedure if your health care provider instructs you not to. ? Taking new dietary supplements or medicines. Do not take these during the week before your procedure unless your health care provider approves them.  If you are told to take a medicine or to continue taking a medicine on the day of the procedure, take the medicine with sips of water. General instructions   Ask if you will be going home the same day, the following day, or after a longer hospital stay. ? Plan to have someone take you home. ? Plan to have someone stay with you for the first 24 hours after you leave the hospital or clinic.  For 3-6 weeks before the procedure, try not to use any tobacco products, such as cigarettes, chewing tobacco, and e-cigarettes.  You may brush your teeth on the morning of the procedure, but make sure to spit out the toothpaste. What happens during the procedure?  You will be given anesthetics through a mask and through an IV tube in one of your veins.  You may receive medicine to help you relax (sedative).  As soon as you are asleep, a breathing tube may be used to help you breathe.  An anesthesia specialist will stay with you throughout the procedure. He or she will help keep  you comfortable and safe by continuing to give you medicines and adjusting the amount of medicine that you get. He or she will also watch your blood pressure, pulse, and oxygen levels to make sure that the anesthetics do not cause any problems.  If a breathing tube was used to help you breathe, it will be removed before you wake up. The procedure may vary among health care providers and hospitals. What happens after the procedure?  You will wake up, often slowly, after the procedure is complete, usually in a recovery area.  Your blood pressure, heart rate, breathing rate, and blood oxygen level will be monitored until the medicines you were given have worn off.  You may be given medicine to help you calm down if you feel anxious or agitated.  If you will be going home the same day, your health care provider may check to make sure you can stand, drink, and urinate.  Your health care providers will treat your pain and side effects before you go home.  Do not drive for 24 hours if you received a sedative.  You may: ? Feel nauseous and vomit. ? Have a sore throat. ? Have mental slowness. ? Feel cold or shivery. ? Feel sleepy. ? Feel tired. ? Feel sore or achy, even in parts of  your body where you did not have surgery. This information is not intended to replace advice given to you by your health care provider. Make sure you discuss any questions you have with your health care provider. Document Released: 01/25/2008 Document Revised: 03/30/2016 Document Reviewed: 10/02/2015 Elsevier Interactive Patient Education  2018 [employer]. General Anesthesia, Adult, Care After These instructions provide you with information about caring for yourself after your procedure. Your health care provider may also give you more specific instructions. Your treatment has been planned according to current medical practices, but problems sometimes occur. Call your health care provider if you have any problems  or questions after your procedure. What can I expect after the procedure? After the procedure, it is common to have:  Vomiting.  A sore throat.  Mental slowness.  It is common to feel:  Nauseous.  Cold or shivery.  Sleepy.  Tired.  Sore or achy, even in parts of your body where you did not have surgery.  Follow these instructions at home: For at least 24 hours after the procedure:  Do not: ? Participate in activities where you could fall or become injured. ? Drive. ? Use heavy machinery. ? Drink alcohol. ? Take sleeping pills or medicines that cause drowsiness. ? Make important decisions or sign legal documents. ? Take care of children on your own.  Rest. Eating and drinking  If you vomit, drink water, juice, or soup when you can drink without vomiting.  Drink enough fluid to keep your urine clear or pale yellow.  Make sure you have little or no nausea before eating solid foods.  Follow the diet recommended by your health care provider. General instructions  Have a responsible adult stay with you until you are awake and alert.  Return to your normal activities as told by your health care provider. Ask your health care provider what activities are safe for you.  Take over-the-counter and prescription medicines only as told by your health care provider.  If you smoke, do not smoke without supervision.  Keep all follow-up visits as told by your health care provider. This is important. Contact a health care provider if:  You continue to have nausea or vomiting at home, and medicines are not helpful.  You cannot drink fluids or start eating again.  You cannot urinate after 8-12 hours.  You develop a skin rash.  You have fever.  You have increasing redness at the site of your procedure. Get help right away if:  You have difficulty breathing.  You have chest pain.  You have unexpected bleeding.  You feel that you are having a life-threatening or  urgent problem. This information is not intended to replace advice given to you by your health care provider. Make sure you discuss any questions you have with your health care provider. Document Released: 01/24/2001 Document Revised: 03/22/2016 Document Reviewed: 10/02/2015 Elsevier Interactive Patient Education  [employer].

## 2017-06-20 ENCOUNTER — Other Ambulatory Visit: Payer: Self-pay | Admitting: Obstetrics and Gynecology

## 2017-06-20 ENCOUNTER — Encounter (HOSPITAL_COMMUNITY)
Admission: RE | Admit: 2017-06-20 | Discharge: 2017-06-20 | Disposition: A | Discharge status: Home / Self Care | Payer: Medicaid Other | Source: Ambulatory Visit | Attending: Obstetrics and Gynecology | Admitting: Obstetrics and Gynecology

## 2017-06-20 ENCOUNTER — Telehealth: Payer: Self-pay | Admitting: Obstetrics and Gynecology

## 2017-06-20 ENCOUNTER — Encounter (HOSPITAL_COMMUNITY): Payer: Self-pay

## 2017-06-20 DIAGNOSIS — R1032 Left lower quadrant pain: Secondary | ICD-10-CM

## 2017-06-20 DIAGNOSIS — Z01812 Encounter for preprocedural laboratory examination: Secondary | ICD-10-CM | POA: Insufficient documentation

## 2017-06-20 DIAGNOSIS — N83202 Unspecified ovarian cyst, left side: Secondary | ICD-10-CM | POA: Insufficient documentation

## 2017-06-20 LAB — URINALYSIS, ROUTINE W REFLEX MICROSCOPIC
Bilirubin Urine: NEGATIVE
GLUCOSE, UA: NEGATIVE mg/dL
HGB URINE DIPSTICK: NEGATIVE
Ketones, ur: NEGATIVE mg/dL
NITRITE: NEGATIVE
PH: 5 (ref 5.0–8.0)
Protein, ur: NEGATIVE mg/dL
SPECIFIC GRAVITY, URINE: 1.018 (ref 1.005–1.030)

## 2017-06-20 LAB — COMPREHENSIVE METABOLIC PANEL
ALT: 11 U/L — AB (ref 14–54)
AST: 17 U/L (ref 15–41)
Albumin: 3.9 g/dL (ref 3.5–5.0)
Alkaline Phosphatase: 55 U/L (ref 38–126)
Anion gap: 10 (ref 5–15)
BUN: 13 mg/dL (ref 6–20)
CHLORIDE: 103 mmol/L (ref 101–111)
CO2: 23 mmol/L (ref 22–32)
CREATININE: 0.8 mg/dL (ref 0.44–1.00)
Calcium: 9.1 mg/dL (ref 8.9–10.3)
GFR calc non Af Amer: >60 mL/min (ref >60)
Glucose, Bld: 111 mg/dL — ABNORMAL HIGH (ref 65–99)
POTASSIUM: 3.3 mmol/L — AB (ref 3.5–5.1)
SODIUM: 136 mmol/L (ref 135–145)
Total Bilirubin: 0.6 mg/dL (ref 0.3–1.2)
Total Protein: 7.1 g/dL (ref 6.5–8.1)

## 2017-06-20 LAB — TYPE AND SCREEN
ABO/RH(D): O POS
ANTIBODY SCREEN: NEGATIVE

## 2017-06-20 LAB — CBC
HCT: 41.3 % (ref 36.0–46.0)
Hemoglobin: 13.7 g/dL (ref 12.0–15.0)
MCH: 31.6 pg (ref 26.0–34.0)
MCHC: 33.2 g/dL (ref 30.0–36.0)
MCV: 95.2 fL (ref 78.0–100.0)
PLATELETS: 192 10*3/uL (ref 150–400)
RBC: 4.34 MIL/uL (ref 3.87–5.11)
RDW: 13.1 % (ref 11.5–15.5)
WBC: 7.2 10*3/uL (ref 4.0–10.5)

## 2017-06-20 LAB — HCG, SERUM, QUALITATIVE: PREG SERUM: NEGATIVE

## 2017-06-20 LAB — SURGICAL PCR SCREEN
MRSA, PCR: NEGATIVE
Staphylococcus aureus: NEGATIVE

## 2017-06-20 MED ORDER — POTASSIUM CHLORIDE 20 MEQ PO PACK: 20.0000 meq | PACK | Freq: Two times a day (BID) | ORAL | 1 refills | Status: DC

## 2017-06-20 NOTE — Telephone Encounter (Signed)
Potassium 20 mEq tabs bid called in

## 2017-06-21 ENCOUNTER — Inpatient Hospital Stay (HOSPITAL_COMMUNITY): Payer: Medicaid Other | Admitting: Anesthesiology

## 2017-06-21 ENCOUNTER — Inpatient Hospital Stay (HOSPITAL_COMMUNITY)
Admission: RE | Admit: 2017-06-21 | Discharge: 2017-06-23 | DRG: 743 | Disposition: A | Discharge status: Home / Self Care | Payer: Medicaid Other | Source: Ambulatory Visit | Attending: Obstetrics and Gynecology | Admitting: Obstetrics and Gynecology

## 2017-06-21 ENCOUNTER — Encounter (HOSPITAL_COMMUNITY)
Admission: RE | Disposition: A | Discharge status: Home / Self Care | Payer: Self-pay | Source: Ambulatory Visit | Attending: Obstetrics and Gynecology

## 2017-06-21 ENCOUNTER — Encounter (HOSPITAL_COMMUNITY): Payer: Self-pay | Admitting: *Deleted

## 2017-06-21 DIAGNOSIS — Z98891 History of uterine scar from previous surgery: Secondary | ICD-10-CM | POA: Diagnosis not present

## 2017-06-21 DIAGNOSIS — Z9851 Tubal ligation status: Secondary | ICD-10-CM | POA: Diagnosis not present

## 2017-06-21 DIAGNOSIS — F17210 Nicotine dependence, cigarettes, uncomplicated: Secondary | ICD-10-CM | POA: Diagnosis present

## 2017-06-21 DIAGNOSIS — Z90711 Acquired absence of uterus with remaining cervical stump: Secondary | ICD-10-CM | POA: Diagnosis present

## 2017-06-21 DIAGNOSIS — Z79899 Other long term (current) drug therapy: Secondary | ICD-10-CM

## 2017-06-21 DIAGNOSIS — N857 Hematometra: Secondary | ICD-10-CM | POA: Diagnosis present

## 2017-06-21 DIAGNOSIS — F319 Bipolar disorder, unspecified: Secondary | ICD-10-CM | POA: Diagnosis present

## 2017-06-21 DIAGNOSIS — N736 Female pelvic peritoneal adhesions (postinfective): Principal | ICD-10-CM | POA: Diagnosis present

## 2017-06-21 DIAGNOSIS — R102 Pelvic and perineal pain: Secondary | ICD-10-CM | POA: Diagnosis present

## 2017-06-21 DIAGNOSIS — F419 Anxiety disorder, unspecified: Secondary | ICD-10-CM | POA: Diagnosis present

## 2017-06-21 DIAGNOSIS — Z881 Allergy status to other antibiotic agents status: Secondary | ICD-10-CM | POA: Diagnosis not present

## 2017-06-21 DIAGNOSIS — Z888 Allergy status to other drugs, medicaments and biological substances status: Secondary | ICD-10-CM

## 2017-06-21 DIAGNOSIS — M797 Fibromyalgia: Secondary | ICD-10-CM | POA: Diagnosis present

## 2017-06-21 HISTORY — PX: SUPRACERVICAL ABDOMINAL HYSTERECTOMY: SHX5393

## 2017-06-21 HISTORY — PX: BILATERAL SALPINGECTOMY: SHX5743

## 2017-06-21 SURGERY — HYSTERECTOMY, SUPRACERVICAL, ABDOMINAL
Anesthesia: General

## 2017-06-21 MED ORDER — PROPOFOL 10 MG/ML IV BOLUS
INTRAVENOUS | Status: AC
  Filled 2017-06-21: qty 40

## 2017-06-21 MED ORDER — ROCURONIUM BROMIDE 100 MG/10ML IV SOLN
INTRAVENOUS | Status: DC | PRN
  Administered 2017-06-21: 10 mg via INTRAVENOUS
  Administered 2017-06-21: 40 mg via INTRAVENOUS

## 2017-06-21 MED ORDER — CEFAZOLIN SODIUM-DEXTROSE 1-4 GM/50ML-% IV SOLN: 1.0000 g | Freq: Once | INTRAVENOUS | Status: DC

## 2017-06-21 MED ORDER — ACETAMINOPHEN 325 MG PO TABS
650.0000 mg | ORAL_TABLET | Freq: Four times a day (QID) | ORAL | Status: DC | PRN
  Administered 2017-06-21: 650 mg via ORAL
  Filled 2017-06-21: qty 2

## 2017-06-21 MED ORDER — IPRATROPIUM-ALBUTEROL 0.5-2.5 (3) MG/3ML IN SOLN
RESPIRATORY_TRACT | Status: AC
  Filled 2017-06-21: qty 3

## 2017-06-21 MED ORDER — SODIUM CHLORIDE 0.9 % IV SOLN
INTRAVENOUS | Status: DC
  Administered 2017-06-21 – 2017-06-23 (×5): via INTRAVENOUS

## 2017-06-21 MED ORDER — TRAZODONE HCL 50 MG PO TABS
100.0000 mg | ORAL_TABLET | Freq: Every day | ORAL | Status: DC
  Administered 2017-06-21 – 2017-06-22 (×2): 100 mg via ORAL
  Filled 2017-06-21 (×2): qty 2

## 2017-06-21 MED ORDER — KETOROLAC TROMETHAMINE 30 MG/ML IJ SOLN
30.0000 mg | Freq: Once | INTRAMUSCULAR | Status: AC
  Administered 2017-06-21: 30 mg via INTRAVENOUS

## 2017-06-21 MED ORDER — CETIRIZINE HCL 10 MG PO TABS
10.0000 mg | ORAL_TABLET | Freq: Every day | ORAL | Status: DC
  Administered 2017-06-21: 10 mg via ORAL
  Filled 2017-06-21: qty 1

## 2017-06-21 MED ORDER — VARENICLINE TARTRATE 1 MG PO TABS
1.0000 mg | ORAL_TABLET | Freq: Two times a day (BID) | ORAL | Status: DC
  Administered 2017-06-21 – 2017-06-23 (×4): 1 mg via ORAL
  Filled 2017-06-21 (×7): qty 1

## 2017-06-21 MED ORDER — HYDROMORPHONE HCL 1 MG/ML IJ SOLN
1.0000 mg | INTRAMUSCULAR | Status: DC | PRN
  Administered 2017-06-21 (×2): 2 mg via INTRAVENOUS
  Filled 2017-06-21: qty 1
  Filled 2017-06-21 (×2): qty 2

## 2017-06-21 MED ORDER — GLYCOPYRROLATE 0.2 MG/ML IJ SOLN
INTRAMUSCULAR | Status: DC | PRN
  Administered 2017-06-21: 0.2 mg via INTRAVENOUS

## 2017-06-21 MED ORDER — BUTALBITAL-APAP-CAFFEINE 50-325-40 MG PO TABS: 1.0000 | ORAL_TABLET | Freq: Four times a day (QID) | ORAL | Status: DC | PRN

## 2017-06-21 MED ORDER — TIZANIDINE HCL 4 MG PO TABS
2.0000 mg | ORAL_TABLET | Freq: Every evening | ORAL | Status: DC | PRN
  Administered 2017-06-22 (×2): 2 mg via ORAL
  Filled 2017-06-21 (×2): qty 1

## 2017-06-21 MED ORDER — IBUPROFEN 600 MG PO TABS
600.0000 mg | ORAL_TABLET | Freq: Four times a day (QID) | ORAL | Status: DC | PRN
  Administered 2017-06-23: 600 mg via ORAL
  Filled 2017-06-21: qty 1

## 2017-06-21 MED ORDER — ONDANSETRON HCL 4 MG/2ML IJ SOLN: 4.0000 mg | Freq: Four times a day (QID) | INTRAMUSCULAR | Status: DC | PRN

## 2017-06-21 MED ORDER — OXYCODONE HCL 5 MG PO TABS
5.0000 mg | ORAL_TABLET | Freq: Once | ORAL | Status: AC | PRN
  Administered 2017-06-21: 5 mg via ORAL
  Filled 2017-06-21: qty 1

## 2017-06-21 MED ORDER — BUPIVACAINE HCL (PF) 0.5 % IJ SOLN
INTRAMUSCULAR | Status: AC
  Filled 2017-06-21: qty 30

## 2017-06-21 MED ORDER — OXYCODONE-ACETAMINOPHEN 5-325 MG PO TABS
1.0000 | ORAL_TABLET | ORAL | Status: DC | PRN
  Administered 2017-06-21 – 2017-06-23 (×7): 2 via ORAL
  Filled 2017-06-21 (×9): qty 2

## 2017-06-21 MED ORDER — LACTATED RINGERS IV SOLN
INTRAVENOUS | Status: DC
  Administered 2017-06-21 (×3): via INTRAVENOUS

## 2017-06-21 MED ORDER — KETOROLAC TROMETHAMINE 30 MG/ML IJ SOLN
30.0000 mg | Freq: Four times a day (QID) | INTRAMUSCULAR | Status: DC
  Administered 2017-06-21 – 2017-06-23 (×8): 30 mg via INTRAVENOUS
  Filled 2017-06-21 (×8): qty 1

## 2017-06-21 MED ORDER — HYDROMORPHONE HCL 1 MG/ML IJ SOLN
0.2500 mg | INTRAMUSCULAR | Status: AC | PRN
  Administered 2017-06-21 (×2): 0.5 mg via INTRAVENOUS

## 2017-06-21 MED ORDER — HYDROMORPHONE HCL 1 MG/ML IJ SOLN
1.0000 mg | INTRAMUSCULAR | Status: DC | PRN
  Administered 2017-06-22 (×2): 1 mg via INTRAVENOUS
  Administered 2017-06-22 (×3): 2 mg via INTRAVENOUS
  Administered 2017-06-22: 1 mg via INTRAVENOUS
  Administered 2017-06-22 (×3): 2 mg via INTRAVENOUS
  Administered 2017-06-23 (×3): 1 mg via INTRAVENOUS
  Filled 2017-06-21 (×3): qty 2
  Filled 2017-06-21 (×3): qty 1
  Filled 2017-06-21: qty 2
  Filled 2017-06-21: qty 1
  Filled 2017-06-21 (×4): qty 2

## 2017-06-21 MED ORDER — CEFAZOLIN SODIUM-DEXTROSE 2-4 GM/100ML-% IV SOLN
2.0000 g | INTRAVENOUS | Status: AC
  Administered 2017-06-21: 2 g via INTRAVENOUS

## 2017-06-21 MED ORDER — BUPIVACAINE-EPINEPHRINE (PF) 0.5% -1:200000 IJ SOLN
INTRAMUSCULAR | Status: AC
  Filled 2017-06-21: qty 30

## 2017-06-21 MED ORDER — EPHEDRINE SULFATE 50 MG/ML IJ SOLN
INTRAMUSCULAR | Status: DC | PRN
  Administered 2017-06-21: 10 mg via INTRAVENOUS

## 2017-06-21 MED ORDER — 0.9 % SODIUM CHLORIDE (POUR BTL) OPTIME
TOPICAL | Status: DC | PRN
  Administered 2017-06-21: 1000 mL

## 2017-06-21 MED ORDER — ONDANSETRON HCL 4 MG PO TABS: 4.0000 mg | ORAL_TABLET | Freq: Four times a day (QID) | ORAL | Status: DC | PRN

## 2017-06-21 MED ORDER — HYDROMORPHONE HCL 1 MG/ML IJ SOLN
0.2500 mg | INTRAMUSCULAR | Status: DC | PRN
  Administered 2017-06-21 (×6): 0.5 mg via INTRAVENOUS
  Filled 2017-06-21 (×3): qty 1

## 2017-06-21 MED ORDER — IPRATROPIUM-ALBUTEROL 0.5-2.5 (3) MG/3ML IN SOLN
3.0000 mL | Freq: Once | RESPIRATORY_TRACT | Status: AC
  Administered 2017-06-21: 3 mL via RESPIRATORY_TRACT

## 2017-06-21 MED ORDER — FENTANYL CITRATE (PF) 100 MCG/2ML IJ SOLN
INTRAMUSCULAR | Status: AC
  Filled 2017-06-21: qty 2

## 2017-06-21 MED ORDER — OXYCODONE HCL 5 MG/5ML PO SOLN: 5.0000 mg | Freq: Once | ORAL | Status: AC | PRN

## 2017-06-21 MED ORDER — VENLAFAXINE HCL ER 75 MG PO CP24
150.0000 mg | ORAL_CAPSULE | Freq: Every day | ORAL | Status: DC
  Administered 2017-06-22 (×2): 150 mg via ORAL
  Filled 2017-06-21 (×2): qty 2

## 2017-06-21 MED ORDER — SODIUM CHLORIDE 0.9 % IJ SOLN
INTRAMUSCULAR | Status: AC
  Filled 2017-06-21: qty 10

## 2017-06-21 MED ORDER — HYDROMORPHONE HCL 1 MG/ML IJ SOLN
INTRAMUSCULAR | Status: AC
  Filled 2017-06-21: qty 1

## 2017-06-21 MED ORDER — FENTANYL CITRATE (PF) 250 MCG/5ML IJ SOLN
INTRAMUSCULAR | Status: AC
  Filled 2017-06-21: qty 5

## 2017-06-21 MED ORDER — SUGAMMADEX SODIUM 500 MG/5ML IV SOLN
INTRAVENOUS | Status: DC | PRN
  Administered 2017-06-21: 168.8 mg via INTRAVENOUS

## 2017-06-21 MED ORDER — FENTANYL CITRATE (PF) 100 MCG/2ML IJ SOLN
INTRAMUSCULAR | Status: DC | PRN
  Administered 2017-06-21 (×5): 50 ug via INTRAVENOUS
  Administered 2017-06-21 (×2): 100 ug via INTRAVENOUS
  Administered 2017-06-21 (×2): 50 ug via INTRAVENOUS

## 2017-06-21 MED ORDER — DEXTROSE 5 % IV SOLN: 3.0000 g | INTRAVENOUS | Status: DC

## 2017-06-21 MED ORDER — LIDOCAINE HCL (CARDIAC) 20 MG/ML IV SOLN
INTRAVENOUS | Status: DC | PRN
  Administered 2017-06-21: 40 mg via INTRAVENOUS

## 2017-06-21 MED ORDER — CEFAZOLIN SODIUM-DEXTROSE 1-4 GM/50ML-% IV SOLN
INTRAVENOUS | Status: AC
  Filled 2017-06-21: qty 50

## 2017-06-21 MED ORDER — MIDAZOLAM HCL 2 MG/2ML IJ SOLN
1.0000 mg | Freq: Once | INTRAMUSCULAR | Status: AC | PRN
Start: 2017-06-21 — End: 2017-06-21
  Administered 2017-06-21: 2 mg via INTRAVENOUS
  Filled 2017-06-21: qty 2

## 2017-06-21 MED ORDER — EPHEDRINE SULFATE 50 MG/ML IJ SOLN
INTRAMUSCULAR | Status: AC
  Filled 2017-06-21: qty 1

## 2017-06-21 MED ORDER — SUGAMMADEX SODIUM 500 MG/5ML IV SOLN
INTRAVENOUS | Status: AC
  Filled 2017-06-21: qty 5

## 2017-06-21 MED ORDER — PROPOFOL 10 MG/ML IV BOLUS
INTRAVENOUS | Status: DC | PRN
  Administered 2017-06-21 (×2): 50 mg via INTRAVENOUS
  Administered 2017-06-21: 150 mg via INTRAVENOUS

## 2017-06-21 MED ORDER — PANTOPRAZOLE SODIUM 40 MG PO TBEC
40.0000 mg | DELAYED_RELEASE_TABLET | Freq: Every day | ORAL | Status: DC
  Administered 2017-06-22 – 2017-06-23 (×2): 40 mg via ORAL
  Filled 2017-06-21 (×2): qty 1

## 2017-06-21 MED ORDER — KETOROLAC TROMETHAMINE 30 MG/ML IJ SOLN: 30.0000 mg | Freq: Four times a day (QID) | INTRAMUSCULAR | Status: DC

## 2017-06-21 MED ORDER — BUPIVACAINE-EPINEPHRINE (PF) 0.5% -1:200000 IJ SOLN
INTRAMUSCULAR | Status: DC | PRN
  Administered 2017-06-21: 5 mL via PERINEURAL

## 2017-06-21 MED ORDER — KETOROLAC TROMETHAMINE 30 MG/ML IJ SOLN
INTRAMUSCULAR | Status: AC
  Filled 2017-06-21: qty 1

## 2017-06-21 MED ORDER — CEFAZOLIN SODIUM-DEXTROSE 2-4 GM/100ML-% IV SOLN
INTRAVENOUS | Status: AC
  Filled 2017-06-21: qty 100

## 2017-06-21 MED ORDER — ONDANSETRON 4 MG PO TBDP
4.0000 mg | ORAL_TABLET | Freq: Once | ORAL | Status: AC
  Administered 2017-06-21: 4 mg via ORAL
  Filled 2017-06-21: qty 1

## 2017-06-21 SURGICAL SUPPLY — 60 items
APL SKNCLS STERI-STRIP NONHPOA (GAUZE/BANDAGES/DRESSINGS) ×3
APPLIER CLIP 13 LRG OPEN (CLIP)
APR CLP LRG 13 20 CLIP (CLIP)
BAG HAMPER (MISCELLANEOUS) ×5 IMPLANT
BENZOIN TINCTURE PRP APPL 2/3 (GAUZE/BANDAGES/DRESSINGS) ×5 IMPLANT
BLADE SURG SZ10 CARB STEEL (BLADE) ×5 IMPLANT
CELLS DAT CNTRL 66122 CELL SVR (MISCELLANEOUS) ×3 IMPLANT
CLIP APPLIE 13 LRG OPEN (CLIP) IMPLANT
CLOSURE STERI-STRIP 1/2X4 (GAUZE/BANDAGES/DRESSINGS) ×1
CLOSURE WOUND 1/2 X4 (GAUZE/BANDAGES/DRESSINGS) ×2
CLOTH BEACON ORANGE TIMEOUT ST (SAFETY) ×5 IMPLANT
CLSR STERI-STRIP ANTIMIC 1/2X4 (GAUZE/BANDAGES/DRESSINGS) ×2 IMPLANT
COVER LIGHT HANDLE STERIS (MISCELLANEOUS) ×10 IMPLANT
DECANTER SPIKE VIAL GLASS SM (MISCELLANEOUS) ×5 IMPLANT
DRAPE WARM FLUID 44X44 (DRAPE) ×5 IMPLANT
DRSG OPSITE POSTOP 4X10 (GAUZE/BANDAGES/DRESSINGS) ×5 IMPLANT
DURAPREP 26ML APPLICATOR (WOUND CARE) ×5 IMPLANT
ELECT REM PT RETURN 9FT ADLT (ELECTROSURGICAL) ×5
ELECTRODE REM PT RTRN 9FT ADLT (ELECTROSURGICAL) ×3 IMPLANT
EVACUATOR DRAINAGE 10X20 100CC (DRAIN) IMPLANT
EVACUATOR SILICONE 100CC (DRAIN)
FORMALIN 10 PREFIL 480ML (MISCELLANEOUS) ×5 IMPLANT
GAUZE SPONGE 4X4 12PLY STRL (GAUZE/BANDAGES/DRESSINGS) ×5 IMPLANT
GLOVE BIOGEL PI IND STRL 7.0 (GLOVE) ×10 IMPLANT
GLOVE BIOGEL PI IND STRL 9 (GLOVE) ×4 IMPLANT
GLOVE BIOGEL PI INDICATOR 7.0 (GLOVE) ×12
GLOVE BIOGEL PI INDICATOR 9 (GLOVE) ×4
GLOVE ECLIPSE 9.0 STRL (GLOVE) ×5 IMPLANT
GOWN SPEC L3 XXLG W/TWL (GOWN DISPOSABLE) ×10 IMPLANT
GOWN STRL REUS W/TWL LRG LVL3 (GOWN DISPOSABLE) ×10 IMPLANT
INST SET MAJOR GENERAL (KITS) ×5 IMPLANT
KIT ROOM TURNOVER APOR (KITS) ×5 IMPLANT
MANIFOLD NEPTUNE II (INSTRUMENTS) ×5 IMPLANT
NDL HYPO 25X1 1.5 SAFETY (NEEDLE) ×2 IMPLANT
NEEDLE HYPO 25X1 1.5 SAFETY (NEEDLE) ×5 IMPLANT
NS IRRIG 1000ML POUR BTL (IV SOLUTION) ×10 IMPLANT
PACK ABDOMINAL MAJOR (CUSTOM PROCEDURE TRAY) ×5 IMPLANT
PAD ARMBOARD 7.5X6 YLW CONV (MISCELLANEOUS) ×5 IMPLANT
RETRACTOR WND ALEXIS 18 MED (MISCELLANEOUS) ×2 IMPLANT
RETRACTOR WND ALEXIS 25 LRG (MISCELLANEOUS) IMPLANT
RTRCTR WOUND ALEXIS 18CM MED (MISCELLANEOUS) ×5
RTRCTR WOUND ALEXIS 25CM LRG (MISCELLANEOUS)
SET BASIN LINEN APH (SET/KITS/TRAYS/PACK) ×5 IMPLANT
SPONGE LAP 18X18 X RAY DECT (DISPOSABLE) IMPLANT
STRIP CLOSURE SKIN 1/2X4 (GAUZE/BANDAGES/DRESSINGS) ×8 IMPLANT
SUT CHROMIC 0 CT 1 (SUTURE) ×40 IMPLANT
SUT CHROMIC 2 0 CT 1 (SUTURE) ×10 IMPLANT
SUT ETHILON 3 0 FSL (SUTURE) IMPLANT
SUT PDS AB CT VIOLET #0 27IN (SUTURE) IMPLANT
SUT PLAIN CT 1/2CIR 2-0 27IN (SUTURE) ×10 IMPLANT
SUT PROLENE 0 CT 1 30 (SUTURE) IMPLANT
SUT VIC AB 0 CT1 27 (SUTURE) ×5
SUT VIC AB 0 CT1 27XBRD ANTBC (SUTURE) ×3 IMPLANT
SUT VICRYL 4 0 KS 27 (SUTURE) ×5 IMPLANT
SUT VICRYL AB 2 0 TIES (SUTURE) ×5 IMPLANT
SYR BULB IRRIGATION 50ML (SYRINGE) ×5 IMPLANT
SYR CONTROL 10ML LL (SYRINGE) ×5 IMPLANT
TOWEL BLUE STERILE X RAY DET (MISCELLANEOUS) ×5 IMPLANT
TOWEL OR 17X26 4PK STRL BLUE (TOWEL DISPOSABLE) ×5 IMPLANT
TRAY FOLEY W/METER SILVER 16FR (SET/KITS/TRAYS/PACK) ×5 IMPLANT

## 2017-06-21 NOTE — Anesthesia Procedure Notes (Signed)
Procedure Name: Intubation Date/Time: 06/21/2017 9:01 AM Performed by: Pernell Dupre, AMY A Pre-anesthesia Checklist: Patient identified, Patient being monitored, Timeout performed, Emergency Drugs available and Suction available Patient Re-evaluated:Patient Re-evaluated prior to induction Oxygen Delivery Method: Circle System Utilized Preoxygenation: Pre-oxygenation with 100% oxygen Induction Type: IV induction Ventilation: Mask ventilation without difficulty Laryngoscope Size: Miller and 3 Grade View: Grade II Tube type: Oral Tube size: 7.0 mm Number of attempts: 1 Airway Equipment and Method: Stylet Placement Confirmation: ETT inserted through vocal cords under direct vision,  positive ETCO2 and breath sounds checked- equal and bilateral Secured at: 21 cm Tube secured with: Tape Dental Injury: Teeth and Oropharynx as per pre-operative assessment

## 2017-06-21 NOTE — Brief Op Note (Addendum)
06/21/2017  10:37 AM  PATIENT:  Mary Parker  36 y.o. female  PRE-OPERATIVE DIAGNOSIS:  Pelvic Adhesions Pelvic Pain , hematometra s/p ablation  POST-OPERATIVE DIAGNOSIS:  Pelvic Adhesions Pelvic Pain , hematometra, s/p ablation  PROCEDURE:  Procedure(s): HYSTERECTOMY SUPRACERVICAL ABDOMINAL (N/A) BILATERAL SALPINGECTOMY (Bilateral)  SURGEON:  Surgeon(s) and Role:    Tilda Burrow, MD - Primary  PHYSICIAN ASSISTANT:   ASSISTANTS: betty ashley RNFA   ANESTHESIA:   local  EBL:  Total I/O In: 2000 [I.V.:2000] Out: 175 [Urine:75; Blood:100]  BLOOD ADMINISTERED:none  DRAINS: Urinary Catheter (Foley)   LOCAL MEDICATIONS USED:  MARCAINE    and Amount: 10 ml  SPECIMEN:  Source of Specimen:  uterus and fallopian tubes  DISPOSITION OF SPECIMEN:  PATHOLOGY  COUNTS:  YES  TOURNIQUET:  * No tourniquets in log *  DICTATION: .Dragon Dictation  PLAN OF CARE: Admit to inpatient   PATIENT DISPOSITION:  PACU - hemodynamically stable.   Delay start of Pharmacological VTE agent (>24hrs) due to surgical blood loss or risk of bleeding: not applicable

## 2017-06-21 NOTE — Anesthesia Preprocedure Evaluation (Signed)
Anesthesia Evaluation    Airway       Dental   Pulmonary Current Smoker,          Cardiovascular     Neuro/Psych    GI/Hepatic   Endo/Other    Renal/GU      Musculoskeletal   Abdominal   Peds  Hematology   Anesthesia Other Findings   Reproductive/Obstetrics                           Anesthesia Physical Anesthesia Plan Anesthesia Quick Evaluation  

## 2017-06-21 NOTE — H&P (Signed)
Preoperative History and Physical  Mary Parker is a 36 y.o. G3P3 here for surgical management of pelvic pain and pelvic adhesions. No significant preoperative concerns. The patient is status post endometrial ablation 13 years ago. She still has cramping discomfort H monthly feels a little bit like a period, but no bleeding per vagina. She is status post tubal sterilization as well as endometrial ablation. She does experience pain in the left lower quadrant and out evaluation today did show normal Pap smear within the right ovary to be mobile and the left ovary did appear to be adherent to adjacent structures Proposed surgery: Abdominal Supracervical Hysterectomy, bilateral salpingectomy possible left oophorectomy      Past Medical History:  Diagnosis Date  . Anxiety   . Arthritis    both knees  . Body aches 12/11/2014  . Chronic insomnia   . Chronic low back pain 05/29/2015  . Common migraine 12/19/2014  . Depression 12/11/2014  . Fibrocystic disease of both breasts   . Fibromyalgia 12/19/2014  . IBS (irritable bowel syndrome)   . Migraines   . Rebound headache 12/19/2014  . Right ovarian cyst 04/25/2017   Complex, check CA 125 and repeat US in 6 weeks   . Sciatica of left side   . Seizures (HCC)    remote past, felt it was related to Chantix   . Vitamin D deficiency         Past Surgical History:  Procedure Laterality Date  . CESAREAN SECTION  [phone]  . CHOLECYSTECTOMY  2001  . COLONOSCOPY WITH PROPOFOL N/A 04/07/2017   Procedure: COLONOSCOPY WITH PROPOFOL;  Surgeon: Corbin Ade, MD;  Location: [facility];  Service: Endoscopy;  Laterality: N/A;  2:15pm  . ENDOMETRIAL ABLATION    . TUBAL LIGATION                     OB History  Gravida Para Term Preterm AB Living  3 3       3   SAB TAB Ectopic Multiple Live Births               # Outcome Date GA Lbr Len/2nd Weight Sex Delivery Anes PTL Lv  3 Para           2 Para            1 Para             Patient denies any other pertinent gynecologic issues.         Current Outpatient Prescriptions on File Prior to Visit  Medication Sig Dispense Refill  . traZODone (DESYREL) 100 MG tablet TAKE 1 TABLET(100 MG) BY MOUTH AT BEDTIME 30 tablet 5  . ALPRAZolam (XANAX) 0.5 MG tablet Take 1 tablet (0.5 mg total) by mouth 2 (two) times daily as needed for anxiety. (Patient not taking: Reported on 06/15/2017) 30 tablet 0  . butalbital-acetaminophen-caffeine (FIORICET, ESGIC) 50-325-40 MG per tablet Take 1 tablet by mouth every 4 (four) hours as needed for migraine.      No current facility-administered medications on file prior to visit.         Allergies  Allergen Reactions  . Chantix [Varenicline] Other (See Comments)    Seizure   . Wellbutrin [Bupropion] Other (See Comments)    Suicidal ideation  . Ciprofloxacin Hives, Swelling and Other (See Comments)    Sweating, dizziness  . Cymbalta [Duloxetine Hcl] Hives, Itching and Rash  . Meloxicam Other (See Comments)  Abdominal cramping and mood changes   . Topamax [Topiramate] Swelling and Other (See Comments)    Dizziness and slurred speech  . Milnacipran Other (See Comments)    Leg cramps  . Gabapentin Itching  . Lyrica [Pregabalin] Other (See Comments)    Tremors   . Nortriptyline Other (See Comments)    GERD    Social History:   reports that she has been smoking Cigarettes.  She has a 30.00 pack-year smoking history. She has never used smokeless tobacco. She reports that she does not drink alcohol or use drugs.       Family History  Problem Relation Age of Onset  . Hypertension Mother   . Other Mother        abnormal cells; had hyst  . Obesity Daughter   . Diabetes Daughter 42       youngest  . Obesity Son   . Cancer Maternal Grandmother   . Osteoporosis Maternal Grandmother   . Cancer Maternal Grandfather   . Obesity Daughter   . Diabetes Father    . Hypertension Father   . Alcohol abuse Father   . Colon cancer Neg Hx     Review of Systems: Noncontributory  PHYSICAL EXAM: Blood pressure 90/60, pulse 80, height 5\' 3"  (1.6 m), weight 189 lb 3.2 oz (85.8 kg). General appearance - alert, well appearing, and in no distress Chest - clear to auscultation, no wheezes, rales or rhonchi, symmetric air entry Heart - normal rate and regular rhythm Abdomen - soft, nontender, nondistended, no masses or organomegaly, well healed c-section scar, moderately obese Pelvic - normal external genitalia, vulva, vagina, cervix, uterus and adnexa,  VULVA:  VAGINA:   CERVIX: well supported  UTERUS:  ADNEXA: 5/10 pain in left adnexa reciprociting pt's pain in left tube and ovary, 1.5 cm nabothian cyst in lower segment{ Extremities - peripheral pulses normal, no pedal edema, no clubbing or cyanosis Bladder: mild 2/10 pain in bladder area   Collecting fluid in the center of the uterus hematometra Transvaginal ultrasound shows an immobile left ovary that appears to be adherent to the side of the uterus Cramping 2-3 days no bleeding    Discussion of risks/benefits of hysterectomy as noted below: 1. Discussed supracervical vs total abdominal hysterectomy. Discussed benefits of forgoing cervix removal, including vaginal lubrication, and risks of forgoing cervix removal, including cervical cancer. Discussed reduced risk of ovarian cancer with oophorectomy from 1 in 100 to 1 in 300. Discussed benefits of salpingectomy with oophorectomy, including further reduced risk of cancer, and without oophorectomy, including avoiding hormone therapy. Given medical explainer review of hysterectomy.   At end of discussion, pt had opportunity to ask questions and has no further questions at this time.   Specific discussion of hysterectomy as noted above. Greater than 50% was spent in counseling and coordination of care with the patient.   Total time greater  than: I personally performed the services described in this documentation, which was SCRIBED in my presence. The recorded information has been reviewed and considered accurate. It has been edited as necessary during review. Tilda Burrow, MD   minutes.     Labs: No results found for this or any previous visit (from the past 336 hour(s)).  Imaging Studies:  ImagingResults  US Transvaginal Non-ob  Result Date: 06/10/2017 GYNECOLOGIC SONOGRAM BRAYDEE SHIMKUS is a 36 y.o. G3P3 s/p ablation,she is here for a pelvic sonogram for LLQ pain. Uterus  6.4 x 3.3 x 4.6 cm, anteverted uterus with two posterior simple myometrial cysts (#1) 1.5 x 1.4 x 1.6 cm,(#2) .9 x 1 x 1 cm Endometrium          1.9 mm, symmetrical, wnl Right ovary             2.2 x 1.5 x 2.7 cm, wnl Left ovary                2.3 x 1.1 x 2.1 cm, wnl,ovary appears to be adhered to the uterus,unable to slide left ovary w/probe pressure No free fluid Technician Comments: PELVIC US TA/TV:anteverted uterus with two posterior simple myometrial cysts (#1) 1.5 x 1.4 x 1.6 cm,(#2) .9 x 1 x 1 cm,normal ovaries bilat,left ovary appears to be adhered to the uterus,unable to slide left ovary w/probe pressure,left adnexal discomfort during ultrasound,EEC 1.9 mm,no free fluid Amber J Baldo Ash 06/09/2017 8:21 AM Clinical Impression and recommendations: I have reviewed the sonogram results above. Combined with the patient's current clinical course, below are my impressions and any appropriate recommendations for management based on the sonographic findings: 1. Small uterus with a 1.5 cm cystic fluid collection within the uterus , likely a endometrial fluid collection s/p ablation. No suspicious features. 2. Normal sized left ovary, with some discomfort due to efforts to manipulate the ovary by u/s. 3 followup will be based on severity of patient symptoms. Grae Leathers V   US Pelvis Complete  Result Date: 06/10/2017 GYNECOLOGIC SONOGRAM  RAMAH LERRO is a 36 y.o. G3P3 s/p ablation,she is here for a pelvic sonogram for LLQ pain. Uterus                      6.4 x 3.3 x 4.6 cm, anteverted uterus with two posterior simple myometrial cysts (#1) 1.5 x 1.4 x 1.6 cm,(#2) .9 x 1 x 1 cm Endometrium          1.9 mm, symmetrical, wnl Right ovary             2.2 x 1.5 x 2.7 cm, wnl Left ovary                2.3 x 1.1 x 2.1 cm, wnl,ovary appears to be adhered to the uterus,unable to slide left ovary w/probe pressure No free fluid Technician Comments: PELVIC US TA/TV:anteverted uterus with two posterior simple myometrial cysts (#1) 1.5 x 1.4 x 1.6 cm,(#2) .9 x 1 x 1 cm,normal ovaries bilat,left ovary appears to be adhered to the uterus,unable to slide left ovary w/probe pressure,left adnexal discomfort during ultrasound,EEC 1.9 mm,no free fluid Amber J Baldo Ash 06/09/2017 8:21 AM Clinical Impression and recommendations: I have reviewed the sonogram results above. Combined with the patient's current clinical course, below are my impressions and any appropriate recommendations for management based on the sonographic findings: 1. Small uterus with a 1.5 cm cystic fluid collection within the uterus , likely a endometrial fluid collection s/p ablation. No suspicious features. 2. Normal sized left ovary, with some discomfort due to efforts to manipulate the ovary by u/s. 3 followup will be based on severity of patient symptoms. Boubacar Lerette V     Assessment:     Patient Active Problem List   Diagnosis Date Noted  . Pelvic adhesions 06/09/2017  . History of ovarian cyst 06/09/2017  . Right ovarian cyst 04/25/2017  . Ovarian mass, left 04/19/2017  . Encounter for gynecological examination with Papanicolaou smear of cervix 04/19/2017  . Rectal bleeding 04/02/2017  .  LLQ pain 04/01/2017  . Constipation 04/01/2017  . Concussion with loss of consciousness 11/16/2016  . Anxiety 10/14/2016  . Chronic low back pain 05/29/2015  . Common migraine 12/19/2014  .  Fibromyalgia 12/19/2014  . Rebound headache 12/19/2014  . Depression 12/11/2014  . Body aches 12/11/2014    Plan: Patient will undergo surgical management with abdominal supracervical hysterectomy, bilateral salpingectomy, and possible removal of left ovary.  Tilda Burrow, MD 06/15/2017 2:43 PM

## 2017-06-21 NOTE — Anesthesia Postprocedure Evaluation (Signed)
Anesthesia Post Note  Patient: Mary Parker  Procedure(s) Performed: Procedure(s) (LRB): HYSTERECTOMY SUPRACERVICAL ABDOMINAL (N/A) BILATERAL SALPINGECTOMY (Bilateral)  Anesthesia Type: General Level of consciousness: awake, oriented and patient cooperative Pain control: Patient rates pain a "10" but then falls asleep with RR of 9-11;  has received Dilaudid. Vital Signs Assessment: post-procedure vital signs reviewed and stable Respiratory status: spontaneous breathing and respiratory function stable Cardiovascular status: stable Postop Assessment: no signs of nausea or vomiting Anesthetic complications: no     Last Vitals:  Vitals:   06/21/17 1045 06/21/17 1100  BP:  106/79  Pulse: 98 (!) 101  Resp: 10 11  Temp:    SpO2: 100% 100%    Last Pain:  Vitals:   06/21/17 1100  PainSc: 10-Worst pain ever                 Hazen Brumett A

## 2017-06-21 NOTE — Anesthesia Preprocedure Evaluation (Addendum)
Anesthesia Evaluation  Patient identified by MRN, date of birth, ID band Patient awake    Airway Mallampati: II  TM Distance: >3 FB Neck ROM: Full    Dental  (+) Teeth Intact   Pulmonary Current Smoker,     + wheezing (very slight wheeze with expiration on right)      Cardiovascular Exercise Tolerance: Good Normal cardiovascular exam Rhythm:Regular Rate:Normal     Neuro/Psych  Headaches, Seizures - (only once proable secondary to medication mix), Well Controlled,  PSYCHIATRIC DISORDERS Anxiety    GI/Hepatic Irritable bowel syndrome   Endo/Other    Renal/GU      Musculoskeletal  (+) Arthritis , Osteoarthritis,  Fibromyalgia -  Abdominal (+) + obese,   Peds  Hematology   Anesthesia Other Findings   Reproductive/Obstetrics                            Anesthesia Physical Anesthesia Plan  ASA: II  Anesthesia Plan: General   Post-op Pain Management:    Induction: Intravenous  PONV Risk Score and Plan:   Airway Management Planned: Oral ETT  Additional Equipment:   Intra-op Plan:   Post-operative Plan: Extubation in OR  Informed Consent: I have reviewed the patients History and Physical, chart, labs and discussed the procedure including the risks, benefits and alternatives for the proposed anesthesia with the patient or authorized representative who has indicated his/her understanding and acceptance.     Plan Discussed with: CRNA  Anesthesia Plan Comments:         Anesthesia Quick Evaluation

## 2017-06-21 NOTE — Op Note (Signed)
06/21/2017  10:37 AM  PATIENT:  Mary Parker  36 y.o. female  PRE-OPERATIVE DIAGNOSIS:  Pelvic Adhesions Pelvic Pain , hematometra s/p ablation  POST-OPERATIVE DIAGNOSIS:  Pelvic Adhesions Pelvic Pain , hematometra, s/p ablation  PROCEDURE:  Procedure(s): HYSTERECTOMY SUPRACERVICAL ABDOMINAL (N/A) BILATERAL SALPINGECTOMY (Bilateral)  SURGEON:  Surgeon(s) and Role:    Tilda Burrow, MD - Primary  PHYSICIAN ASSISTANT:   ASSISTANTS: betty ashley RNFA   ANESTHESIA:   local  EBL:  Total I/O In: 2000 [I.V.:2000] Out: 175 [Urine:75; Blood:100]  BLOOD ADMINISTERED:none  DRAINS: Urinary Catheter (Foley)   LOCAL MEDICATIONS USED:  MARCAINE    and Amount: 10 ml  SPECIMEN:  Source of Specimen:  uterus and fallopian tubes  DISPOSITION OF SPECIMEN:  PATHOLOGY  COUNTS:  YES  TOURNIQUET:  * No tourniquets in log *  DICTATION: .Dragon Dictation  PLAN OF CARE: Admit to inpatient   PATIENT DISPOSITION:  PACU - hemodynamically stable.   Delay start of Pharmacological VTE agent (>24hrs) due to surgical blood loss or risk of bleeding: not applicable Details of procedure: Patient was taken operating room prepped and draped for lower abdominal surgery. Previous C-section scar was opened in a transverse fashion with sharp dissection down to the fascia which was opened transversely and peritoneal cavity entered without difficulty. Bowel was packed away, Alexis wound retractor positioned, and attention to the pelvis. There were some adhesions from the center part of the uterine fundus to the sigmoid colon and epiploic fat on the patient's left side these were easily taken down. The uterus was then removed in standard fashion with the round ligament, and utero-ovarian ligament clamped cut and suture ligated in a single pedicle. Uterus was quite small was taken down with broad ligament transection exposing uterine vessels. Curved Heaney clamp was placed on uterine vessels which were then  transected. Sutured with 0 chromic suture. Upper cardinal ligaments were then transected and taken down in similar fashion the cervix was left in situ and the uterus indicated off of lower uterine segment level and a conical fashion and the lower uterine segment oversewn with 0 chromic front to back. Marcaine was injected in the cervical stump in order to improve immediate postop pain. Attention then directed to the adnexa where salpingectomy was performed on each side leaving the ovaries in place the ovaries were well away from the vaginal cuff to reduce chances of recurrent adhesions sponge and needle counts were correct. Laparotomy, was removed, anterior peritoneum closed using 2-0 chromic, fascia closed with running 0 Vicryl, subcutaneous tissues approximated using horizontal mattress sutures of 20 plain followed by subcuticular 4-0 Vicryl skin closure sponge and needle counts were correct

## 2017-06-21 NOTE — Transfer of Care (Signed)
Immediate Anesthesia Transfer of Care Note  Patient: Mary Parker  Procedure(s) Performed: Procedure(s): HYSTERECTOMY SUPRACERVICAL ABDOMINAL (N/A) BILATERAL SALPINGECTOMY (Bilateral)  Patient Location: PACU  Anesthesia Type:General  Level of Consciousness: awake, oriented and patient cooperative  Airway & Oxygen Therapy: Patient Spontanous Breathing and Patient connected to face mask oxygen  Post-op Assessment: Report given to RN and Post -op Vital signs reviewed and stable  Post vital signs: Reviewed and stable  Last Vitals:  Vitals:   06/21/17 0845 06/21/17 0850  BP: 108/70 105/72  Resp: 16 13  SpO2:      Last Pain:  Vitals:   06/21/17 0821  PainSc: 8       Patients Stated Pain Goal: 7 (06/21/17 0821)  Complications: No apparent anesthesia complications

## 2017-06-22 ENCOUNTER — Encounter (HOSPITAL_COMMUNITY): Payer: Self-pay | Admitting: Obstetrics and Gynecology

## 2017-06-22 LAB — BASIC METABOLIC PANEL
ANION GAP: 5 (ref 5–15)
BUN: 7 mg/dL (ref 6–20)
CALCIUM: 8 mg/dL — AB (ref 8.9–10.3)
CO2: 26 mmol/L (ref 22–32)
Chloride: 107 mmol/L (ref 101–111)
Creatinine, Ser: 0.64 mg/dL (ref 0.44–1.00)
GFR calc Af Amer: >60 mL/min (ref >60)
GLUCOSE: 110 mg/dL — AB (ref 65–99)
Potassium: 3.6 mmol/L (ref 3.5–5.1)
SODIUM: 138 mmol/L (ref 135–145)

## 2017-06-22 LAB — CBC
HCT: 38.1 % (ref 36.0–46.0)
Hemoglobin: 12.4 g/dL (ref 12.0–15.0)
MCH: 32 pg (ref 26.0–34.0)
MCHC: 32.5 g/dL (ref 30.0–36.0)
MCV: 98.2 fL (ref 78.0–100.0)
PLATELETS: 126 10*3/uL — AB (ref 150–400)
RBC: 3.88 MIL/uL (ref 3.87–5.11)
RDW: 13.5 % (ref 11.5–15.5)
WBC: 6 10*3/uL (ref 4.0–10.5)

## 2017-06-22 MED ORDER — LORATADINE 10 MG PO TABS
10.0000 mg | ORAL_TABLET | Freq: Every day | ORAL | Status: DC
  Administered 2017-06-23: 10 mg via ORAL
  Filled 2017-06-22 (×2): qty 1

## 2017-06-22 NOTE — Addendum Note (Signed)
Addendum  created 06/22/17 1748 by Franco Nones, CRNA   Sign clinical note

## 2017-06-22 NOTE — Anesthesia Postprocedure Evaluation (Addendum)
Anesthesia Post Note  Patient: Mary Parker  Procedure(s) Performed: Procedure(s) (LRB): HYSTERECTOMY SUPRACERVICAL ABDOMINAL (N/A) BILATERAL SALPINGECTOMY (Bilateral)  Patient location during evaluation: Nursing Unit Anesthesia Type: General Level of consciousness: awake and alert Pain management: pain level not controlled (10/10) Vital Signs Assessment: post-procedure vital signs reviewed and stable Respiratory status: spontaneous breathing Cardiovascular status: stable Postop Assessment: no signs of nausea or vomiting Anesthetic complications: no Comments: Nurse administered pain medicine 10 minutes ago according to patient. Bed  Positioned changed. HOB elevated. Patient denies other complaints.     Last Vitals:  Vitals:   06/22/17 1315 06/22/17 1700  BP: (!) 81/52 117/77  Pulse: 86 97  Resp: 16   Temp: 37.2 C   SpO2: 100%     Last Pain:  Vitals:   06/22/17 1315  TempSrc: Oral  PainSc:                  Minerva Areola

## 2017-06-22 NOTE — Progress Notes (Signed)
1 Day Post-Op Procedure(s) (LRB): HYSTERECTOMY SUPRACERVICAL ABDOMINAL (N/A) BILATERAL SALPINGECTOMY (Bilateral)  Subjective: Patient reports incisional pain, tolerating PO and no problems voiding.   The pain has been significantly greater than expected in the left lower quadrant which she describes as a burning sensation. The surgery itself was uncomplicated other than  the round ligament on the left was somewhat attached to the insertion of the utero-ovarian ligament, and these were taken down together. This seemed to result in plenty of laxity to wear there would be no traction on the ovary, and keep the ovary well away from the vaginal cuff, and on the lateral sidewall. We'll follow her pain management over the next few weeks. She would not be a candidate for going home today she will need IV pain medicine today Objective: I have reviewed patient's vital signs, medications and labs. I and O's are good IV is somewhat kinked and was repositioned General: alert, cooperative and no distress Resp: clear to auscultation bilaterally GI: soft, non-tender; bowel sounds normal; no masses,  no organomegaly and incision: clean, dry and intact Vaginal Bleeding: none  Assessment: s/p Procedure(s): HYSTERECTOMY SUPRACERVICAL ABDOMINAL (N/A) BILATERAL SALPINGECTOMY (Bilateral): stable  Plan: Discontinue IV fluids Will need to continue IV analgesics today. I still think she will go home tomorrow but we'll need greater than normal pain management  LOS: 1 day    Kasey Hansell V 06/22/2017, 8:32 AM

## 2017-06-22 NOTE — Addendum Note (Signed)
Addendum  created 06/22/17 1709 by Franco Nones, CRNA   Sign clinical note

## 2017-06-22 NOTE — Progress Notes (Signed)
Pt ambulated around nurses station several times throughout night, tolerated well.

## 2017-06-22 NOTE — Progress Notes (Signed)
Educated pt about frequency of IV narcotics and managing pain without using narcotics as often as well as transitioning to PO pain medications when possible. Pt states pain "creeps up" after 2 hours and needs medicine again, will continue to monitor and educate.

## 2017-06-22 NOTE — Anesthesia Postprocedure Evaluation (Signed)
Anesthesia Post Note  Patient: KAMORAH DEMBO  Procedure(s) Performed: Procedure(s) (LRB): HYSTERECTOMY SUPRACERVICAL ABDOMINAL (N/A) BILATERAL SALPINGECTOMY (Bilateral)  Patient location during evaluation: Nursing Unit Anesthesia Type: General Level of consciousness: awake and alert Pain management: satisfactory to patient (1-2/10, down from 10/10 ) Vital Signs Assessment: post-procedure vital signs reviewed and stable Respiratory status: spontaneous breathing Cardiovascular status: stable Postop Assessment: no signs of nausea or vomiting Anesthetic complications: no Comments: Patient states she feels much better now.On  Prior Post-op visit this afternoon, patient rated pain 10/10.     Last Vitals:  Vitals:   06/22/17 1315 06/22/17 1700  BP: (!) 81/52 117/77  Pulse: 86 97  Resp: 16   Temp: 37.2 C   SpO2: 100%     Last Pain:  Vitals:   06/22/17 1742  TempSrc:   PainSc: 0-No pain                 Dekota Kirlin

## 2017-06-22 NOTE — Addendum Note (Signed)
Addendum  created 06/22/17 1702 by Franco Nones, CRNA   Sign clinical note

## 2017-06-22 NOTE — Progress Notes (Signed)
Pt's BP 81/52.  Pt requested Dilaudid IV.  Nurse informed pt that she would have to wait until her BP improved to receive further Dilaudid IV. Pt verbalized understanding.

## 2017-06-23 MED ORDER — DOCUSATE SODIUM 100 MG PO CAPS: 100.0000 mg | ORAL_CAPSULE | Freq: Two times a day (BID) | ORAL | 2 refills | Status: DC | PRN

## 2017-06-23 MED ORDER — OXYCODONE-ACETAMINOPHEN 5-325 MG PO TABS: 1.0000 | ORAL_TABLET | ORAL | 0 refills | Status: DC | PRN

## 2017-06-23 MED ORDER — HYDROMORPHONE HCL 2 MG PO TABS: 2.0000 mg | ORAL_TABLET | ORAL | 0 refills | Status: DC | PRN

## 2017-06-23 NOTE — Progress Notes (Signed)
Discharge instructions and prescriptions given, verbalized understanding, out in stable condition via w/c with staff. 

## 2017-06-23 NOTE — Discharge Instructions (Signed)
Abdominal Hysterectomy, Care After °This sheet gives you information about how to care for yourself after your procedure. Your doctor may also give you more specific instructions. If you have problems or questions, contact your doctor. °Follow these instructions at home: °Bathing °· Do not take baths, swim, or use a hot tub until your doctor says it is okay. Ask your doctor if you can take showers. You may only be allowed to take sponge baths for bathing. °· Keep the bandage (dressing) dry until your doctor says it can be taken off. °Surgical cut ( °incision) care °· Follow instructions from your doctor about how to take care of your cut from surgery. Make sure you: °? Wash your hands with soap and water before you change your bandage (dressing). If you cannot use soap and water, use hand sanitizer. °? Change your bandage as told by your doctor. °? Leave stitches (sutures), skin glue, or skin tape (adhesive) strips in place. They may need to stay in place for 2 weeks or longer. If tape strips get loose and curl up, you may trim the loose edges. Do not remove tape strips completely unless your doctor says it is okay. °· Check your surgical cut area every day for signs of infection. Check for: °? Redness, swelling, or pain. °? Fluid or blood. °? Warmth. °? Pus or a bad smell. °Activity °· Do gentle, daily exercise as told by your doctor. You may be told to take short walks every day and go farther each time. °· Do not lift anything that is heavier than 10 lb (4.5 kg), or the limit that your doctor tells you, until he or she says that it is safe. °· Do not drive or use heavy machinery while taking prescription pain medicine. °· Do not drive for 24 hours if you were given a medicine to help you relax (sedative). °· Follow your doctor's advice about exercise, driving, and general activities. Ask your doctor what activities are safe for you. °Lifestyle °· Do not douche, use tampons, or have sex for at least 6 weeks or as  told by your doctor. °· Do not drink alcohol until your doctor says it is okay. °· Drink enough fluid to keep your pee (urine) clear or pale yellow. °· Try to have someone at home with you for the first 1-2 weeks to help. °· Do not use any products that contain nicotine or tobacco, such as cigarettes and e-cigarettes. These can slow down healing. If you need help quitting, ask your doctor. °General instructions °· Take over-the-counter and prescription medicines only as told by your doctor. °· Do not take aspirin or ibuprofen. These medicines can cause bleeding. °· To prevent or treat constipation while you are taking prescription pain medicine, your doctor may suggest that you: °? Drink enough fluid to keep your urine clear or pale yellow. °? Take over-the-counter or prescription medicines. °? Eat foods that are high in fiber, such as: °§ Fresh fruits and vegetables. °§ Whole grains. °§ Beans. °? Limit foods that are high in fat and processed sugars, such as fried and sweet foods. °· Keep all follow-up visits as told by your doctor. This is important. °Contact a doctor if: °· You have chills or fever. °· You have redness, swelling, or pain around your cut. °· You have fluid or blood coming from your cut. °· Your cut feels warm to the touch. °· You have pus or a bad smell coming from your cut. °· Your cut breaks   open. °· You feel dizzy or light-headed. °· You have pain or bleeding when you pee. °· You keep having watery poop (diarrhea). °· You keep feeling sick to your stomach (nauseous) or keep throwing up (vomiting). °· You have unusual fluid (discharge) coming from your vagina. °· You have a rash. °· You have a reaction to your medicine. °· Your pain medicine does not help. °Get help right away if: °· You have a fever and your symptoms get worse all of a sudden. °· You have very bad belly (abdominal) pain. °· You are short of breath. °· You pass out (faint). °· You have pain, swelling, or redness of your  leg. °· You bleed a lot from your vagina and notice clumps of blood (clots). °Summary °· Do not take baths, swim, or use a hot tub until your doctor says it is okay. Ask your doctor if you can take showers. You may only be allowed to take sponge baths for bathing. °· Follow your doctor's advice about exercise, driving, and general activities. Ask your doctor what activities are safe for you. °· Do not lift anything that is heavier than 10 lb (4.5 kg), or the limit that your doctor tells you, until he or she says that it is safe. °· Try to have someone at home with you for the first 1-2 weeks to help. °This information is not intended to replace advice given to you by your health care provider. Make sure you discuss any questions you have with your health care provider. °Document Released: 07/27/2008 Document Revised: 10/06/2016 Document Reviewed: 10/06/2016 °Elsevier Interactive Patient Education © 2017 [employer] ° °

## 2017-06-23 NOTE — Care Management Note (Signed)
Case Management Note  Patient Details  Name: Mary Parker MRN: [id number] Date of Birth: [birth year]  Subjective/Objective:                  Admitted s/p abd hysterectomy. Chart reviewed for CM needs. From home, ind, insurance with drug coverage. Has f/u provider. No needs noted.   Action/Plan: DC home with self care.   Expected Discharge Date:  06/23/17               Expected Discharge Plan:  Home/Self Care  In-House Referral:  NA  Discharge planning Services  CM Consult  Post Acute Care Choice:  NA Choice offered to:  NA  Status of Service:  Completed, signed off  Malcolm Metro, RN 06/23/2017, 9:44 AM

## 2017-06-23 NOTE — Discharge Summary (Signed)
Physician Discharge Summary  Patient ID: Mary Parker MRN: [id number] DOB/AGE: [birth year] 37 y.o.  Admit date: 06/21/2017 Discharge date: 06/23/2017  Admission Diagnoses  Pelvic adhesions, pelvic pain fibromyalgia  Discharge Diagnoses:  Active Problems:   S/P abdominal supracervical subtotal hysterectomy Fibromyalgia  Discharged Condition: fair  Hospital Course: This 36 year old female status post tubal ligation, cesarean section 3 and endometrial ablation was admitted for pelvic pain associated with an immobile left adnexa, felt to have pelvic adhesions. She has had some medical history including fibromyalgia. Surgical findings included a small number of adhesions from the top of the uterus to the sigmoid colon which may have been contributory to her discomfort though the pain was out of proportion to the adhesions found. The left ovary specifically was able be salvaged. Bilateral salpingectomy was performed for cancer risk reduction. Postoperative course was notable for unexpectedly significant bilateral lower quadrant pain left greater than right which over the first 2 days required increased analgesics. Clinical exam and physical findings and laboratory results did not identify any surgical complications. Bowel function returned promptly. The patient required increased analgesics and was discharged home on Percocet 42 tablets to last week. She was given an additional 6 tablets of Dilaudid as well as stool softener. Physical exam at this time discharge suggested that the pain was abdominal wall discomfort related to the surgical incision that there were no evidence of hematoma or incision disruption or other abnormality. Patient will be followed up in 1 week or office  Consults: None  Significant Diagnostic Studies: labs:  CBC Latest Ref Rng & Units 06/22/2017 06/20/2017 04/06/2017  WBC 4.0 - 10.5 K/uL 6.0 7.2 9.5  Hemoglobin 12.0 - 15.0 g/dL 57.8 46.9 62.9  Hematocrit 36.0 - 46.0 % 38.1  41.3 41.3  Platelets 150 - 400 K/uL 126(L) 192 193    BMET    Component Value Date/Time   NA 138 06/22/2017 0419   K 3.6 06/22/2017 0419   CL 107 06/22/2017 0419   CO2 26 06/22/2017 0419   GLUCOSE 110 (H) 06/22/2017 0419   BUN 7 06/22/2017 0419   CREATININE 0.64 06/22/2017 0419   CALCIUM 8.0 (L) 06/22/2017 0419   GFRNONAA >60 06/22/2017 0419   GFRAA >60 06/22/2017 0419    Treatments: surgery: Abdominal supracervical hysterectomy bilateral salpingectomy, incidental lysis of adhesions  Discharge Exam: Blood pressure 116/76, pulse 86, temperature 98.2 F (36.8 C), temperature source Oral, resp. rate 15, height 5\' 4"  (1.626 m), weight 186 lb (84.4 kg), SpO2 100 %. General appearance: alert, cooperative and moderately obese Head: Normocephalic, without obvious abnormality, atraumatic Resp: clear to auscultation bilaterally GI: soft, non-tender; bowel sounds normal; no masses,  no organomegaly Extremities: extremities normal, atraumatic, no cyanosis or edema and Homans sign is negative, no sign of DVT Incision/Wound:  Disposition: 01-Home or Self Care  Discharge Instructions     Remove dressing in 72 hours    Complete by:  As directed    Or may leave on til postop visit   Call MD for:  persistant nausea and vomiting    Complete by:  As directed    Call MD for:  severe uncontrolled pain    Complete by:  As directed    Call MD for:  temperature >100.4    Complete by:  As directed    Diet - low sodium heart healthy    Complete by:  As directed    Increase activity slowly    Complete by:  As directed  Allergies as of 06/23/2017      Reactions   Wellbutrin [bupropion] Other (See Comments)   Suicidal ideation   Ciprofloxacin Hives, Swelling, Other (See Comments)   Sweating, dizziness   Cymbalta [duloxetine Hcl] Hives, Itching, Rash   Meloxicam Other (See Comments)   Abdominal cramping and mood changes   Topamax [topiramate] Swelling, Other (See Comments)   Dizziness  and slurred speech   Savella [milnacipran] Other (See Comments)   Dizziness and nausea, leg cramps   Gabapentin Itching   Lyrica [pregabalin] Other (See Comments)   Tremors   Nortriptyline Other (See Comments)   GERD      Medication List    STOP taking these medications   potassium chloride 20 MEQ packet Commonly known as:  KLOR-CON     TAKE these medications   ALPRAZolam 0.5 MG tablet Commonly known as:  XANAX Take 1 tablet (0.5 mg total) by mouth 2 (two) times daily as needed for anxiety. What changed:  when to take this   B-2 PO Take 1 tablet by mouth 2 (two) times a week.   butalbital-acetaminophen-caffeine 50-325-40 MG tablet Commonly known as:  FIORICET, ESGIC Take 1 tablet by mouth every 6 (six) hours as needed for migraine.   cetirizine HCl 1 MG/ML solution Commonly known as:  ZYRTEC Take 10 mg by mouth at bedtime.   desvenlafaxine 100 MG 24 hr tablet Commonly known as:  PRISTIQ Take 100 mg by mouth at bedtime.   docusate sodium 100 MG capsule Commonly known as:  COLACE Take 1 capsule (100 mg total) by mouth 2 (two) times daily as needed.   HYDROmorphone 2 MG tablet Commonly known as:  DILAUDID Take 1 tablet (2 mg total) by mouth every 4 (four) hours as needed for severe pain (if unrelieved by percocet).   oxyCODONE-acetaminophen 5-325 MG tablet Commonly known as:  PERCOCET/ROXICET Take 1-2 tablets by mouth every 4 (four) hours as needed for severe pain (moderate to severe pain (when tolerating fluids)).   tiZANidine 2 MG tablet Commonly known as:  ZANAFLEX Take 2 mg by mouth at bedtime as needed for muscle spasms.   traZODone 100 MG tablet Commonly known as:  DESYREL TAKE 1 TABLET(100 MG) BY MOUTH AT BEDTIME What changed:  See the new instructions.   varenicline 1 MG tablet Commonly known as:  CHANTIX Take 1 mg by mouth 2 (two) times daily.            Discharge Care Instructions        Start     Ordered   06/23/17 0000   oxyCODONE-acetaminophen (PERCOCET/ROXICET) 5-325 MG tablet  Every 4 hours PRN     06/23/17 0816   06/23/17 0000  Increase activity slowly     06/23/17 0816   06/23/17 0000  Diet - low sodium heart healthy     06/23/17 0816   06/23/17 0000   Remove dressing in 72 hours     06/23/17 0816   06/23/17 0000  Call MD for:  temperature >100.4     06/23/17 0816   06/23/17 0000  Call MD for:  persistant nausea and vomiting     06/23/17 0816   06/23/17 0000  Call MD for:  severe uncontrolled pain     06/23/17 0816   06/23/17 0000  docusate sodium (COLACE) 100 MG capsule  2 times daily PRN     06/23/17 0816   06/23/17 0000  HYDROmorphone (DILAUDID) 2 MG tablet  Every 4 hours PRN  06/23/17 0347     Follow-up Information    Tilda Burrow, MD Follow up in 1 week(s).   Specialties:  Obstetrics and Gynecology, Radiology Contact information: [address] [place] [address] [phone]           Signed: Tilda Burrow 06/23/2017, 8:18 AM

## 2017-06-24 ENCOUNTER — Telehealth: Payer: Self-pay | Admitting: *Deleted

## 2017-06-24 NOTE — Telephone Encounter (Signed)
Patient called stating she had low grade temp 100.6, no chills. She was in so much pain throughout the night she was unable to sleep. Incision is burning and very uncomfortable. She has taken Percocet along with Dilaudid and Ibuprofen with not much relief. She has has some nausea as well and has had something to eat when taking medication.    Spoke to Dr Emelda Fear and verbal order for Doxycycline 100mg  BID x 7 days and Phenergan 25mg -take 1 tablet by mouth every 6 hours as needed for nausea-dispense 10, no refills.   Pt notified of prescriptions.

## 2017-06-27 ENCOUNTER — Telehealth: Payer: Self-pay | Admitting: *Deleted

## 2017-06-27 ENCOUNTER — Encounter: Payer: Self-pay | Admitting: Neurology

## 2017-06-27 ENCOUNTER — Other Ambulatory Visit: Payer: Self-pay | Admitting: Obstetrics & Gynecology

## 2017-06-27 MED ORDER — AMOXICILLIN-POT CLAVULANATE 875-125 MG PO TABS: 1.0000 | ORAL_TABLET | Freq: Two times a day (BID) | ORAL | 0 refills | Status: DC

## 2017-06-27 MED ORDER — HYDROCODONE-ACETAMINOPHEN 5-325 MG PO TABS: 1.0000 | ORAL_TABLET | Freq: Four times a day (QID) | ORAL | 0 refills | Status: DC | PRN

## 2017-06-27 NOTE — Telephone Encounter (Signed)
Patient called stating she figured out the pain medication was causing her nausea and the antibiotic is making her feel terrible. She states she has had periods of hot and cold as well as a temp 100.6 last night. She has a long list of allergies and wants to stick with what she knows works, Hydrocodone and Amoxicillin if possible. Informed patient Dr Emelda Fear was out of the office until Wednesday. Please advise if medications can be changed.

## 2017-06-29 ENCOUNTER — Encounter: Payer: Medicaid Other | Admitting: Obstetrics and Gynecology

## 2017-06-29 ENCOUNTER — Encounter: Payer: Self-pay | Admitting: Obstetrics and Gynecology

## 2017-06-29 ENCOUNTER — Ambulatory Visit (INDEPENDENT_AMBULATORY_CARE_PROVIDER_SITE_OTHER): Payer: Medicaid Other | Admitting: Obstetrics and Gynecology

## 2017-06-29 VITALS — BP 100/72 | HR 74 | Ht 64.0 in | Wt 192.6 lb

## 2017-06-29 DIAGNOSIS — Z9889 Other specified postprocedural states: Secondary | ICD-10-CM

## 2017-06-29 DIAGNOSIS — Z09 Encounter for follow-up examination after completed treatment for conditions other than malignant neoplasm: Secondary | ICD-10-CM

## 2017-06-29 NOTE — Progress Notes (Addendum)
Patient ID: ROSELLE CUCINELLA, female   DOB: [birth year], 36 y.o.   MRN: [id number]    Subjective:  ASYIAH FREEBERG is a 36 y.o. female now 8 days status post supracervical hysterectomy. Pt reports mild bruising. Notes taking one sticky bandage off because it started to come off on its own. Otherwise pt denies any other complaints at this time.    Review of Systems Negative   Diet:   normal   Bowel movements : normal.  The patient is not having any pain.  Objective:  BP 100/72 (BP Location: Right Arm, Patient Position: Sitting, Cuff Size: Normal)   Pulse 74   Ht 5\' 4"  (1.626 m)   Wt 192 lb 9.6 oz (87.4 kg)   BMI 33.06 kg/m   General:Well developed, well nourished.  No acute distress. Abdomen: Bowel sounds normal, soft, non-tender.                  Pelvic Exam: Deferred  Incision(s):   Healing well, no drainage, no erythema, no hernia, no swelling, no dehiscence.  Assessment:  Post-Op 8 days s/p supracervical hysterectomy    Healing well postoperatively.   Plan:  1.Wound care discussed  2. Current medications. 3. 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 return to work:@ 4 weeks. 5. Follow up in 2 weeks.

## 2017-07-05 ENCOUNTER — Ambulatory Visit: Payer: Medicaid Other | Admitting: Gastroenterology

## 2017-07-06 ENCOUNTER — Telehealth: Payer: Self-pay | Admitting: Obstetrics and Gynecology

## 2017-07-06 NOTE — Telephone Encounter (Signed)
Patient states that she strained her self this weekend and she is in a lot of pain. Pt would like a call back. Please contact pt

## 2017-07-06 NOTE — Telephone Encounter (Signed)
Spoke with pt. Pt is having sharp pains shooting through her incision. She done a lot of walking this weekend and thinks she over did it. She has 1 Hydrocodone left. Please advise. She has a scheduled appt next week. Thanks!! JSY

## 2017-07-07 ENCOUNTER — Encounter: Payer: Self-pay | Admitting: Neurology

## 2017-07-07 ENCOUNTER — Telehealth: Payer: Self-pay | Admitting: Obstetrics and Gynecology

## 2017-07-07 ENCOUNTER — Ambulatory Visit (INDEPENDENT_AMBULATORY_CARE_PROVIDER_SITE_OTHER): Payer: Medicaid Other | Admitting: Neurology

## 2017-07-07 ENCOUNTER — Other Ambulatory Visit: Payer: Self-pay | Admitting: Obstetrics and Gynecology

## 2017-07-07 ENCOUNTER — Telehealth: Payer: Self-pay | Admitting: *Deleted

## 2017-07-07 VITALS — BP 121/80 | HR 90 | Wt 191.0 lb

## 2017-07-07 DIAGNOSIS — G8929 Other chronic pain: Secondary | ICD-10-CM | POA: Diagnosis not present

## 2017-07-07 DIAGNOSIS — G43019 Migraine without aura, intractable, without status migrainosus: Secondary | ICD-10-CM

## 2017-07-07 DIAGNOSIS — M25511 Pain in right shoulder: Secondary | ICD-10-CM | POA: Diagnosis not present

## 2017-07-07 DIAGNOSIS — M533 Sacrococcygeal disorders, not elsewhere classified: Secondary | ICD-10-CM

## 2017-07-07 DIAGNOSIS — M25512 Pain in left shoulder: Secondary | ICD-10-CM

## 2017-07-07 DIAGNOSIS — M797 Fibromyalgia: Secondary | ICD-10-CM

## 2017-07-07 DIAGNOSIS — G8918 Other acute postprocedural pain: Secondary | ICD-10-CM

## 2017-07-07 MED ORDER — HYDROCODONE-ACETAMINOPHEN 5-325 MG PO TABS
1.0000 | ORAL_TABLET | Freq: Four times a day (QID) | ORAL | 0 refills | Status: DC | PRN
Start: 2017-07-07 — End: 2017-07-11

## 2017-07-07 NOTE — Patient Instructions (Signed)
   We will get another left SI joint injection

## 2017-07-07 NOTE — Telephone Encounter (Signed)
seen this week already

## 2017-07-07 NOTE — Telephone Encounter (Signed)
Pt called stating that post hysterectomy she had some thick off white mucous discharge come out in a large clump today and it worried her. I advised pt that it sounded like a normal post surgery discharge since it was not green/yellow in color. Pt states that her main concern is the amount of pain that she is in. She states that she hurts so bad she is crying. Advised pt that I would speak with a provider about this.

## 2017-07-07 NOTE — Telephone Encounter (Signed)
Doing a lot , having pain, will rx Vicodin refil

## 2017-07-07 NOTE — Progress Notes (Signed)
Reason for visit: Fibromyalgia  Mary Parker is an 36 y.o. female  History of present illness:  Mary Parker is a 36 year old right-handed white female with a history of fibromyalgia, migraine headache, and chronic low back pain. The patient has a psychogenic left hemisensory deficit. The patient returns as she is having some increased neuromuscular pain in the neck and shoulder areas bilaterally. The patient had a hysterectomy on 06/23/2017, she has not been as active as usual because of this and she believes that this may be the etiology of her increased neuromuscular pain. The patient is having some troubles with left SI joint pain at this time, the injections previously have been helpful. The last injection was done on 04/18/2017. The patient returns to this office for an evaluation. She is followed by Dr. Rodolph Bong from orthopedic surgery for her right knee arthritis.  Past Medical History:  Diagnosis Date  . Anxiety   . Arthritis    both knees  . Body aches 12/11/2014  . Chronic insomnia   . Chronic low back pain 05/29/2015  . Common migraine 12/19/2014  . Depression 12/11/2014  . Fibrocystic disease of both breasts   . Fibromyalgia 12/19/2014  . IBS (irritable bowel syndrome)   . Migraines   . Rebound headache 12/19/2014  . Right ovarian cyst 04/25/2017   Complex, check CA 125 and repeat US in 6 weeks   . Sciatica of left side   . Seizures (HCC)    remote past, felt it was related to Chantix   . Vitamin D deficiency     Past Surgical History:  Procedure Laterality Date  . BILATERAL SALPINGECTOMY Bilateral 06/21/2017   Procedure: BILATERAL SALPINGECTOMY;  Surgeon: Tilda Burrow, MD;  Location: [facility];  Service: Gynecology;  Laterality: Bilateral;  . CESAREAN SECTION  [phone]  . CHOLECYSTECTOMY  2001  . COLONOSCOPY WITH PROPOFOL N/A 04/07/2017   Procedure: COLONOSCOPY WITH PROPOFOL;  Surgeon: Corbin Ade, MD;  Location: [facility];  Service: Endoscopy;   Laterality: N/A;  2:15pm  . ENDOMETRIAL ABLATION    . SUPRACERVICAL ABDOMINAL HYSTERECTOMY N/A 06/21/2017   Procedure: HYSTERECTOMY SUPRACERVICAL ABDOMINAL;  Surgeon: Tilda Burrow, MD;  Location: [facility];  Service: Gynecology;  Laterality: N/A;  . TUBAL LIGATION      Family History  Problem Relation Age of Onset  . Hypertension Mother   . Other Mother        abnormal cells; had hyst  . Obesity Daughter   . Diabetes Daughter 30       youngest  . Obesity Son   . Cancer Maternal Grandmother   . Osteoporosis Maternal Grandmother   . Cancer Maternal Grandfather   . Obesity Daughter   . Diabetes Father   . Hypertension Father   . Alcohol abuse Father   . Colon cancer Neg Hx     Social history:  reports that she has been smoking Cigarettes.  She has a 11.50 pack-year smoking history. She has never used smokeless tobacco. She reports that she does not drink alcohol or use drugs.    Allergies  Allergen Reactions  . Wellbutrin [Bupropion] Other (See Comments)    Suicidal ideation  . Ciprofloxacin Hives, Swelling and Other (See Comments)    Sweating, dizziness  . Cymbalta [Duloxetine Hcl] Hives, Itching and Rash  . Meloxicam Other (See Comments)    Abdominal cramping and mood changes   . Topamax [Topiramate] Swelling and Other (See Comments)    Dizziness  and slurred speech  . Doxycycline Nausea Only  . Percocet [Oxycodone-Acetaminophen] Nausea Only  . Savella [Milnacipran] Other (See Comments)    Dizziness and nausea, leg cramps  . Gabapentin Itching  . Lyrica [Pregabalin] Other (See Comments)    Tremors   . Nortriptyline Other (See Comments)    GERD    Medications:  Prior to Admission medications   Medication Sig Start Date End Date Taking? Authorizing Provider  ALPRAZolam Prudy Feeler(XANAX) 0.5 MG tablet Take 1 tablet (0.5 mg total) by mouth 2 (two) times daily as needed for anxiety. Patient taking differently: Take 0.5 mg by mouth daily as needed for anxiety.  10/14/16  Yes  Adline PotterGriffin, Jennifer A, NP  butalbital-acetaminophen-caffeine (FIORICET, ESGIC) 50-325-40 MG per tablet Take 1 tablet by mouth every 6 (six) hours as needed for migraine.    Yes [provider]  cetirizine HCl (ZYRTEC) 1 MG/ML solution Take 10 mg by mouth at bedtime.   Yes [provider]  desvenlafaxine (PRISTIQ) 100 MG 24 hr tablet Take 100 mg by mouth at bedtime.    Yes [provider]  tiZANidine (ZANAFLEX) 2 MG tablet Take 2 mg by mouth at bedtime as needed for muscle spasms.  05/16/17  Yes [provider]  traZODone (DESYREL) 100 MG tablet TAKE 1 TABLET(100 MG) BY MOUTH AT BEDTIME Patient taking differently: TAKE 1 TABLET(50 MG) BY MOUTH AT BEDTIME 04/19/17  Yes York SpanielWillis, Arlander Gillen K, MD  varenicline (CHANTIX) 1 MG tablet Take 1 mg by mouth 2 (two) times daily.   Yes [provider]    ROS:  Out of a complete 14 system review of symptoms, the patient complains only of the following symptoms, and all other reviewed systems are negative.  Shoulder and neck pain Low back pain Arthritis pain  Blood pressure 121/80, pulse 90, weight 191 lb (86.6 kg).  Physical Exam  General: The patient is alert and cooperative at the time of the examination. The patient is moderately obese.  Neuromuscular: Range of movement of the low back is full.  Skin: No significant peripheral edema is noted.   Neurologic Exam  Mental status: The patient is alert and oriented x 3 at the time of the examination. The patient has apparent normal recent and remote memory, with an apparently normal attention span and concentration ability.   Cranial nerves: Facial symmetry is present. Speech is normal, no aphasia or dysarthria is noted. Extraocular movements are full. Visual fields are full.  Motor: The patient has good strength in all 4 extremities.  Sensory examination: Soft touch sensation is symmetric on the face, but is decreased on the left arm and leg relative to the  right.  Coordination: The patient has good finger-nose-finger and heel-to-shin bilaterally.  Gait and station: The patient has a normal gait. Tandem gait is normal. Romberg is negative. No drift is seen.  Reflexes: Deep tendon reflexes are symmetric.   Assessment/Plan:  1. Fibromyalgia  2. History of migraine headache  3. SI joint dysfunction, left greater than right  The patient will be sent for a left SI joint injection again, if the pain continues to return, she may require an orthopedic surgery or pain center referral for this. The patient will have trigger point injections today. She will follow-up in about 4 months.  Marlan Palau. Keith Tyrees Chopin MD 07/07/2017 10:31 AM  [facility] [address] [place], [place] [address]  Phone [phone] Fax [phone]

## 2017-07-07 NOTE — Telephone Encounter (Signed)
Dr Emelda FearFerguson needs to make the decision on her pain meds, she has already been seen this week and he owuld have addressed then if her felt an issue.  I do not want to interfere with his care plan

## 2017-07-07 NOTE — Telephone Encounter (Signed)
Informed pt that request for refill on pain medication had been denied. Informed pt to try alternating ibuprofen and tylenol as long as she isnt allergic to those to see if that would help with the pain. Advised pt to try and take it easy and not over do things. Advised her to keep her appt with Dr Emelda FearFerguson next week and she could follow up with him then.

## 2017-07-07 NOTE — Procedures (Signed)
     Mary LiasCasey Parker is a 36 year old patient with a history of fibromyalgia who comes in with discomfort that is increased in the neck and shoulder area over the last several months. The patient is to have trigger point injections of this area.   Procedure:  Trigger point injections for performed using 0.5% bupivacaine, 250 mg per 50 mL, 5 mg/mL concentration.  1 mL of bupivacaine was injected at 5 levels on each side for a total of 10 injections from the cervical area and upper thoracic area paraspinal muscles.  The patient tolerated the procedure well.  NDC 5515 0-2 5 0-5 0  Lot #  [id number]  Expiration date March 2020

## 2017-07-11 ENCOUNTER — Ambulatory Visit (INDEPENDENT_AMBULATORY_CARE_PROVIDER_SITE_OTHER): Payer: Medicaid Other | Admitting: Obstetrics and Gynecology

## 2017-07-11 ENCOUNTER — Encounter: Payer: Self-pay | Admitting: Obstetrics and Gynecology

## 2017-07-11 ENCOUNTER — Other Ambulatory Visit: Payer: Self-pay | Admitting: Neurology

## 2017-07-11 VITALS — BP 114/70 | HR 72 | Wt 189.6 lb

## 2017-07-11 DIAGNOSIS — Z90711 Acquired absence of uterus with remaining cervical stump: Secondary | ICD-10-CM

## 2017-07-11 DIAGNOSIS — Z9889 Other specified postprocedural states: Secondary | ICD-10-CM

## 2017-07-11 DIAGNOSIS — M533 Sacrococcygeal disorders, not elsewhere classified: Secondary | ICD-10-CM

## 2017-07-11 DIAGNOSIS — Z09 Encounter for follow-up examination after completed treatment for conditions other than malignant neoplasm: Secondary | ICD-10-CM

## 2017-07-11 MED ORDER — HYDROCODONE-ACETAMINOPHEN 5-325 MG PO TABS: 1.0000 | ORAL_TABLET | Freq: Four times a day (QID) | ORAL | 0 refills | Status: DC | PRN

## 2017-07-11 NOTE — Progress Notes (Signed)
Patient ID: Mary Parker, female   DOB: [birth year], 36 y.o.   MRN: [id number]    Subjective:  Mary Parker is a 36 y.o. female now 3 weeks status post supracervical hysterectomy. Pt reports persistent mild abdominal pain. She also reports mucous discharge and intermittment bleeding yesterday. Pt had to pick her son up Saturday night after he had been drinking. Pt's son is about 300 pounds and she reports pain after having to help move him.   Review of Systems Negative except abdominal pain, vaginal discharge, and mild vaginal bleeding   Diet:   Normal   Bowel movements : normal.  Pain is not well controlled.  Medications being used: none. Can't take NSAID'S due to IBS  Objective:  BP 114/70 (BP Location: Right Arm, Patient Position: Sitting, Cuff Size: Normal)   Pulse 72   Wt 189 lb 9.6 oz (86 kg)   BMI 32.54 kg/m  General:Well developed, well nourished.  No acute distress. Abdomen: Bowel sounds normal, soft, non-tender, slight thickness in the left lower tissue Pelvic Exam:    External Genitalia:  Normal.    Vagina: Normal    Cervix: generous mucous non purulent, light blood. By manual normal tenderness and stiffness for week three.     Uterus: Surgically Removed    Adnexa/Bimanual: Normal  Incision(s):   Healing well, no drainage, no erythema, no hernia, no swelling, no dehiscence,   Assessment:  Post-Op 3 weeks s/p supracervical hysterectomy   Healing well postoperatively.   Plan:  1.Wound care discussed   2. . current medications. 3. Activity restrictions: no lifting more than 25 pounds 4. return to work: at reduced duties. She is to return to work part time this week and full time next week.  5. Follow up in 3 weeks.  By signing my name below, I, Diona BrownerJennifer Gorman, attest that this documentation has been prepared under the direction and in the presence of Tilda BurrowFerguson, Aubra Pappalardo V, MD. Electronically Signed: Diona BrownerJennifer Gorman, Medical Scribe. 07/11/17. 1:25 PM.  I personally  performed the services described in this documentation, which was SCRIBED in my presence. The recorded information has been reviewed and considered accurate. It has been edited as necessary during review. Tilda BurrowFERGUSON,Nina Hoar V, MD

## 2017-07-12 ENCOUNTER — Telehealth: Payer: Self-pay | Admitting: *Deleted

## 2017-07-12 ENCOUNTER — Telehealth: Payer: Self-pay | Admitting: Neurology

## 2017-07-12 NOTE — Telephone Encounter (Signed)
Called and spoke with patient's pharmacy. Received PA request for Nucynta 50mg  tablet. Rx written 06/10/17 by Dr Anne HahnWillis. Patient just dropped off to pharmacy and needing PA.  Pt just received rx hydrocodone 5-325 mg tablet 07/07/17 from Dr Tyrell AntonioFurgeson #[id number].   Spoke with Dr Anne HahnWillis. He advised me to call pharmacy and cx rx Nucynta since she received opiate from Dr Tyrell AntonioFurgeson. She cannot receive opiates from multiple providers.  I called and spoke with pharmacist, Lorin PicketScott. He cancelled rx Nucynta.

## 2017-07-12 NOTE — Telephone Encounter (Signed)
This patient has gotten a prescription for Nucynta through this office, but also received hydrocodone through Dr. Emelda FearFerguson yesterday. The patient received 42 tablets of hydrocodone.  The prescription for Nucynta will be discontinued, the patient will not receive any controlled substances through this office in the future.

## 2017-07-13 ENCOUNTER — Encounter: Payer: Medicaid Other | Admitting: Obstetrics and Gynecology

## 2017-07-15 ENCOUNTER — Encounter: Payer: Self-pay | Admitting: Neurology

## 2017-07-18 ENCOUNTER — Encounter: Payer: Self-pay | Admitting: Obstetrics and Gynecology

## 2017-07-27 ENCOUNTER — Other Ambulatory Visit: Payer: Self-pay

## 2017-08-03 ENCOUNTER — Encounter: Payer: Medicaid Other | Admitting: Obstetrics and Gynecology

## 2017-08-10 ENCOUNTER — Encounter: Payer: Self-pay | Admitting: Obstetrics and Gynecology

## 2017-08-10 ENCOUNTER — Ambulatory Visit (INDEPENDENT_AMBULATORY_CARE_PROVIDER_SITE_OTHER): Payer: Medicaid Other | Admitting: Obstetrics and Gynecology

## 2017-08-10 ENCOUNTER — Ambulatory Visit
Admission: RE | Admit: 2017-08-10 | Discharge: 2017-08-10 | Disposition: A | Discharge status: Home / Self Care | Payer: Medicaid Other | Source: Ambulatory Visit | Attending: Neurology | Admitting: Neurology

## 2017-08-10 VITALS — BP 120/76 | HR 79 | Ht 64.0 in | Wt 187.6 lb

## 2017-08-10 DIAGNOSIS — Z09 Encounter for follow-up examination after completed treatment for conditions other than malignant neoplasm: Secondary | ICD-10-CM

## 2017-08-10 DIAGNOSIS — M533 Sacrococcygeal disorders, not elsewhere classified: Secondary | ICD-10-CM

## 2017-08-10 DIAGNOSIS — Z9889 Other specified postprocedural states: Secondary | ICD-10-CM

## 2017-08-10 MED ORDER — IOPAMIDOL (ISOVUE-M 200) INJECTION 41%
1.0000 mL | Freq: Once | INTRAMUSCULAR | Status: AC
  Administered 2017-08-10: 1 mL via INTRA_ARTICULAR

## 2017-08-10 MED ORDER — METHYLPREDNISOLONE ACETATE 40 MG/ML INJ SUSP (RADIOLOG
120.0000 mg | Freq: Once | INTRAMUSCULAR | Status: AC
  Administered 2017-08-10: 120 mg via INTRA_ARTICULAR

## 2017-08-10 NOTE — Discharge Instructions (Signed)

## 2017-08-10 NOTE — Progress Notes (Addendum)
   Subjective:  Mary Parker is a 36 y.o. female now 6 weeks status post supracervical hysterectomy. Bilateral salpingectomy    Review of Systems Negative   Diet:   Regular   Bowel movements : normal.  The patient is not having any pain.  Objective:  There were no vitals taken for this visit. General:Well developed, well nourished.  No acute distress. Abdomen: Bowel sounds normal, soft, non-tender. Pelvic Exam: not indicated   Incision(s):   Healing well, no drainage, no erythema, no hernia, no swelling, no dehiscence,     Assessment:  Post-Op 6 weeks s/p supracervical hysterectomy with bilateral salpingectomy   Healing well postoperatively.   Plan:  1.Wound care discussed   2. . current medications.none 3. Activity restrictions: none 4. return to work: not applicable. 5. Follow up PRN.     By signing my name below, I, Izna Ahmed, attest that this documentation has been prepared under the direction and in the presence of Tilda Burrow, MD. Electronically Signed: Redge Gainer, Medical Scribe. 08/10/17. 12:00 PM.  I personally performed the services described in this documentation, which was SCRIBED in my presence. The recorded information has been reviewed and considered accurate. It has been edited as necessary during review. Tilda Burrow, MD

## 2017-08-17 ENCOUNTER — Other Ambulatory Visit: Payer: Self-pay | Admitting: Neurology

## 2017-08-17 ENCOUNTER — Encounter: Payer: Self-pay | Admitting: Neurology

## 2017-08-17 MED ORDER — TAPENTADOL HCL 50 MG PO TABS: 50.0000 mg | ORAL_TABLET | Freq: Four times a day (QID) | ORAL | 0 refills | Status: DC | PRN

## 2017-08-17 NOTE — Progress Notes (Signed)
A prescription for Nucynta will be sent in, the patient has been released from her surgeon, her last hydrocodone prescription for 42 tablets was given on 07/15/2017. This was for the 5/325 mg tablets.  I will give her 30 Nucynta tablets a month if needed.

## 2017-08-18 ENCOUNTER — Other Ambulatory Visit: Payer: Self-pay | Admitting: Neurology

## 2017-08-24 ENCOUNTER — Ambulatory Visit (INDEPENDENT_AMBULATORY_CARE_PROVIDER_SITE_OTHER): Payer: Medicaid Other | Admitting: Neurology

## 2017-08-24 ENCOUNTER — Encounter: Payer: Self-pay | Admitting: Neurology

## 2017-08-24 VITALS — BP 113/80 | HR 87 | Ht 64.0 in | Wt 192.0 lb

## 2017-08-24 DIAGNOSIS — G43019 Migraine without aura, intractable, without status migrainosus: Secondary | ICD-10-CM

## 2017-08-24 DIAGNOSIS — M797 Fibromyalgia: Secondary | ICD-10-CM | POA: Diagnosis not present

## 2017-08-24 MED ORDER — RIZATRIPTAN BENZOATE 10 MG PO TBDP: 10.0000 mg | ORAL_TABLET | Freq: Three times a day (TID) | ORAL | 5 refills | Status: DC | PRN

## 2017-08-24 MED ORDER — ONDANSETRON 4 MG PO TBDP: 4.0000 mg | ORAL_TABLET | Freq: Three times a day (TID) | ORAL | 1 refills | Status: DC | PRN

## 2017-08-24 NOTE — Procedures (Signed)
     History:   Mary Parker is a 36 year old patient with a history of fibromyalgia. The patient reports trigger points in the intrascapular areas bilaterally and on the right anterior thigh. She comes in for trigger point injections.  Description of procedure:  The patient was injected with bupivacaine 0.5% solution, 1 mL was injected on 3 locations bilaterally in the intrascapular area into the trapezius muscle. The patient received 1 mL injections into the lateral aspect of the right thigh on 4 locations distally to proximally. The patient received 1 mL injections on 3 locations medially distally to proximally on the thigh.  The patient tolerated procedure well. occasions were noted.  0.5% bupivacaine was used, and the NDC #5515 0-2 5 0-5 0  Expiration date is March 2020. Lot number [id number]

## 2017-08-24 NOTE — Progress Notes (Addendum)
Reason for visit: Migraine headache, fibromyalgia  Mary Parker is an 36 y.o. female  History of present illness:  Ms. Vangieson is a 36 year old right-handed white female with a history of fibromyalgia and a history of migraine headaches. The patient also has a psychogenic left hemisensory deficit that has been persistent. The patient has bilateral SI joint dysfunction, she has gained some benefit from SI joint injections in the past, she currently is complaining of some left hip and left leg discomfort. The patient has soreness primarily in the interscapular area bilaterally and currently is having some discomfort in the right anterior thigh. The patient claims that her migraine headaches usually are under good control but she has had 2 headaches this month, the first headache lasted about 2 days, she is currently having a migraine headache now that is going on 3 days. The Fioricet is no longer working for her. The patient has nausea and vomiting associated with the headache. She returns to this office for further evaluation.  Past Medical History:  Diagnosis Date  . Anxiety   . Arthritis    both knees  . Body aches 12/11/2014  . Chronic insomnia   . Chronic low back pain 05/29/2015  . Common migraine 12/19/2014  . Depression 12/11/2014  . Fibrocystic disease of both breasts   . Fibromyalgia 12/19/2014  . IBS (irritable bowel syndrome)   . Migraines   . Rebound headache 12/19/2014  . Right ovarian cyst 04/25/2017   Complex, check CA 125 and repeat US in 6 weeks   . Sciatica of left side   . Seizures (HCC)    remote past, felt it was related to Chantix   . Vitamin D deficiency     Past Surgical History:  Procedure Laterality Date  . BILATERAL SALPINGECTOMY Bilateral 06/21/2017   Procedure: BILATERAL SALPINGECTOMY;  Surgeon: Tilda Burrow, MD;  Location: [facility];  Service: Gynecology;  Laterality: Bilateral;  . CESAREAN SECTION  [id number]  . CHOLECYSTECTOMY  2001  . COLONOSCOPY  WITH PROPOFOL N/A 04/07/2017   Procedure: COLONOSCOPY WITH PROPOFOL;  Surgeon: Corbin Ade, MD;  Location: [facility];  Service: Endoscopy;  Laterality: N/A;  2:15pm  . ENDOMETRIAL ABLATION    . SUPRACERVICAL ABDOMINAL HYSTERECTOMY N/A 06/21/2017   Procedure: HYSTERECTOMY SUPRACERVICAL ABDOMINAL;  Surgeon: Tilda Burrow, MD;  Location: [facility];  Service: Gynecology;  Laterality: N/A;  . TUBAL LIGATION      Family History  Problem Relation Age of Onset  . Hypertension Mother   . Other Mother        abnormal cells; had hyst  . Obesity Daughter   . Diabetes Daughter 30       youngest  . Obesity Son   . Cancer Maternal Grandmother   . Osteoporosis Maternal Grandmother   . Cancer Maternal Grandfather   . Obesity Daughter   . Diabetes Father   . Hypertension Father   . Alcohol abuse Father   . Colon cancer Neg Hx     Social history:  reports that she has been smoking Cigarettes.  She has a 11.50 pack-year smoking history. She has never used smokeless tobacco. She reports that she does not drink alcohol or use drugs.    Allergies  Allergen Reactions  . Wellbutrin [Bupropion] Other (See Comments)    Suicidal ideation  . Ciprofloxacin Hives, Swelling and Other (See Comments)    Sweating, dizziness  . Cymbalta [Duloxetine Hcl] Hives, Itching and Rash  . Meloxicam Other (  See Comments)    Abdominal cramping and mood changes   . Savella [Milnacipran] Other (See Comments)    Dizziness and nausea, leg cramps  . Topamax [Topiramate] Swelling and Other (See Comments)    Dizziness and slurred speech  . Doxycycline Nausea Only  . Gabapentin Itching  . Lyrica [Pregabalin] Other (See Comments)    Tremors   . Nortriptyline Other (See Comments)    GERD  . Percocet [Oxycodone-Acetaminophen] Nausea Only    Medications:  Prior to Admission medications   Medication Sig Start Date End Date Taking? Authorizing Provider  ALPRAZolam Prudy Feeler(XANAX) 0.5 MG tablet Take 1 tablet (0.5 mg total)  by mouth 2 (two) times daily as needed for anxiety. 10/14/16  Yes Adline PotterGriffin, Jennifer A, NP  Black Cohosh-Soy-Ginkgo-Magnol (ESTROVEN MOOD & MEMORY PO) Take 1 tablet by mouth at bedtime.   Yes [provider]  butalbital-acetaminophen-caffeine (FIORICET, ESGIC) 50-325-40 MG per tablet Take 1 tablet by mouth every 6 (six) hours as needed for migraine.    Yes [provider]  cetirizine HCl (ZYRTEC) 1 MG/ML solution Take 10 mg by mouth at bedtime.   Yes [provider]  desvenlafaxine (PRISTIQ) 100 MG 24 hr tablet Take 100 mg by mouth at bedtime.    Yes [provider]  tapentadol (NUCYNTA) 50 MG tablet Take 1 tablet (50 mg total) by mouth every 6 (six) hours as needed. Must last 28 days 08/17/17  Yes York SpanielWillis, Charles K, MD  tiZANidine (ZANAFLEX) 2 MG tablet TAKE 1 TABLET BY MOUTH TWICE DAILY, THEN TAKE 2 TABLETS EVERY EVENING 08/18/17  Yes York SpanielWillis, Charles K, MD  traZODone (DESYREL) 100 MG tablet TAKE 1 TABLET(100 MG) BY MOUTH AT BEDTIME Patient taking differently: TAKE 1 TABLET(50 MG) BY MOUTH AT BEDTIME 04/19/17  Yes York SpanielWillis, Charles K, MD  varenicline (CHANTIX) 1 MG tablet Take 1 mg by mouth 2 (two) times daily.   Yes [provider]    ROS:  Out of a complete 14 system review of symptoms, the patient complains only of the following symptoms, and all other reviewed systems are negative.  Muscle pain Headache Numbness  Blood pressure 113/80, pulse 87, height 5\' 4"  (1.626 m), weight 192 lb (87.1 kg).  Physical Exam  General: The patient is alert and cooperative at the time of the examination. The patient is moderately obese.  Neuromuscular: The patient lacks about 30 of full lateral rotation of the cervical spine bilaterally. Range of motion movement of the low back is full.  Skin: No significant peripheral edema is noted.   Neurologic Exam  Mental status: The patient is alert and oriented x 3 at the time of the examination. The patient has  apparent normal recent and remote memory, with an apparently normal attention span and concentration ability.   Cranial nerves: Facial symmetry is present. Speech is normal, no aphasia or dysarthria is noted. Extraocular movements are full. Visual fields are full.  Motor: The patient has good strength in all 4 extremities.  Sensory examination: Soft touch sensation is decreased on the left face, arm, and leg as compared to the right.  Coordination: The patient has good finger-nose-finger and heel-to-shin bilaterally.  Gait and station: The patient has a normal gait. Tandem gait is normal. Romberg is negative. No drift is seen.  Reflexes: Deep tendon reflexes are symmetric.   Assessment/Plan:  1. Migraine headache  2. Fibromyalgia  3. Bilateral SI joint dysfunction  4. Psychogenic left hemisensory deficit  The patient is no longer getting benefit  from the Fioricet, we will switch her to Maxalt. The patient will be given orally disintegrating tablets of Zofran for nausea. She will have trigger point injections today. She will follow-up for her next scheduled appointment in January 2019.  This patient has been treated with Nucynta since April 2018.  She has been on this medication previously and gained benefit within the last several years, she had been more recently switched to Ultram which was not effective.  The patient was changed back to Nucynta therefore in April 2018.  The patient has issues with chronic back and leg discomfort associated with SI joint dysfunction and a history of migraine headache and fibromyalgia.  The Nucynta seems to help these pain issues.  Marlan Palau MD 08/24/2017 7:58 AM  [facility] [address] [place], [place] [address]  Phone [phone] Fax [phone]

## 2017-08-25 ENCOUNTER — Ambulatory Visit: Payer: Self-pay | Admitting: Gastroenterology

## 2017-08-26 ENCOUNTER — Encounter: Payer: Self-pay | Admitting: Neurology

## 2017-08-26 NOTE — Telephone Encounter (Signed)
Faxed completed/signed PA Nucynta to [employer] at [phone] with supporting office notes. Received fax confirmation. Awaiting response.

## 2017-08-29 NOTE — Telephone Encounter (Signed)
Checked status of PA Nucynta on [facility]. It was approved effective 08/26/17-02/22/18. PA#: [id number].

## 2017-09-07 ENCOUNTER — Ambulatory Visit: Payer: Medicaid Other | Admitting: Neurology

## 2017-10-13 ENCOUNTER — Encounter: Payer: Self-pay | Admitting: Neurology

## 2017-10-13 ENCOUNTER — Other Ambulatory Visit: Payer: Self-pay | Admitting: Neurology

## 2017-10-13 MED ORDER — TAPENTADOL HCL 50 MG PO TABS: 50.0000 mg | ORAL_TABLET | Freq: Four times a day (QID) | ORAL | 0 refills | Status: DC | PRN

## 2017-10-23 ENCOUNTER — Other Ambulatory Visit: Payer: Self-pay | Admitting: Neurology

## 2017-10-27 ENCOUNTER — Other Ambulatory Visit: Payer: Self-pay

## 2017-10-27 ENCOUNTER — Emergency Department (HOSPITAL_COMMUNITY)
Admission: EM | Admit: 2017-10-27 | Discharge: 2017-10-27 | Disposition: A | Discharge status: Home / Self Care | Payer: Medicaid Other | Attending: Emergency Medicine | Admitting: Emergency Medicine

## 2017-10-27 ENCOUNTER — Encounter (HOSPITAL_COMMUNITY): Payer: Self-pay

## 2017-10-27 DIAGNOSIS — Z79899 Other long term (current) drug therapy: Secondary | ICD-10-CM | POA: Diagnosis not present

## 2017-10-27 DIAGNOSIS — F17210 Nicotine dependence, cigarettes, uncomplicated: Secondary | ICD-10-CM | POA: Diagnosis not present

## 2017-10-27 DIAGNOSIS — L0231 Cutaneous abscess of buttock: Secondary | ICD-10-CM | POA: Diagnosis not present

## 2017-10-27 MED ORDER — HYDROCODONE-ACETAMINOPHEN 5-325 MG PO TABS: 1.0000 | ORAL_TABLET | ORAL | 0 refills | Status: DC | PRN

## 2017-10-27 MED ORDER — POVIDONE-IODINE 10 % EX SOLN
CUTANEOUS | Status: AC
  Administered 2017-10-27: 20:00:00
  Filled 2017-10-27: qty 15

## 2017-10-27 MED ORDER — PROMETHAZINE HCL 12.5 MG PO TABS
12.5000 mg | ORAL_TABLET | Freq: Once | ORAL | Status: AC
  Administered 2017-10-27: 12.5 mg via ORAL
  Filled 2017-10-27: qty 1

## 2017-10-27 MED ORDER — HYDROCODONE-ACETAMINOPHEN 5-325 MG PO TABS
2.0000 | ORAL_TABLET | Freq: Once | ORAL | Status: AC
  Administered 2017-10-27: 2 via ORAL
  Filled 2017-10-27: qty 2

## 2017-10-27 MED ORDER — CEPHALEXIN 500 MG PO CAPS
500.0000 mg | ORAL_CAPSULE | Freq: Once | ORAL | Status: AC
  Administered 2017-10-27: 500 mg via ORAL
  Filled 2017-10-27: qty 1

## 2017-10-27 MED ORDER — CLINDAMYCIN HCL 150 MG PO CAPS
300.0000 mg | ORAL_CAPSULE | Freq: Once | ORAL | Status: AC
Start: 2017-10-27 — End: 2017-10-27
  Administered 2017-10-27: 300 mg via ORAL
  Filled 2017-10-27: qty 2

## 2017-10-27 MED ORDER — BUPIVACAINE HCL (PF) 0.25 % IJ SOLN
20.0000 mL | Freq: Once | INTRAMUSCULAR | Status: AC
  Administered 2017-10-27: 20 mL
  Filled 2017-10-27: qty 30

## 2017-10-27 MED ORDER — DOXYCYCLINE HYCLATE 100 MG PO TABS
100.0000 mg | ORAL_TABLET | Freq: Once | ORAL | Status: DC
  Filled 2017-10-27: qty 1

## 2017-10-27 MED ORDER — CLINDAMYCIN HCL 150 MG PO CAPS: ORAL_CAPSULE | ORAL | 0 refills | Status: DC

## 2017-10-27 NOTE — ED Provider Notes (Signed)
[facility] EMERGENCY DEPARTMENT Provider Note   CSN: [id number] Arrival date & time: 10/27/17  1830     History   Chief Complaint Chief Complaint  Patient presents with  . Abscess    HPI Mary Parker is a 36 y.o. female.  Patient is a 36 year old female who presents to the emergency department with complaint of abscess of the right buttocks.  The patient states that about 3 days ago she noticed the abscess.  She states that it was larger than it is now because the area has ruptured and a lot of foul-smelling pus like material has drained from the area.  She states she has not had fever or chills, but is just had intense pain in the buttocks area.  She has not had an abscess before.  To her knowledge she does not have any medical conditions that should bother her immune system.  She is not diabetic.      Past Medical History:  Diagnosis Date  . Anxiety   . Arthritis    both knees  . Body aches 12/11/2014  . Chronic insomnia   . Chronic low back pain 05/29/2015  . Common migraine 12/19/2014  . Depression 12/11/2014  . Fibrocystic disease of both breasts   . Fibromyalgia 12/19/2014  . IBS (irritable bowel syndrome)   . Migraines   . Rebound headache 12/19/2014  . Right ovarian cyst 04/25/2017   Complex, check CA 125 and repeat US in 6 weeks   . Sciatica of left side   . Seizures (HCC)    remote past, felt it was related to Chantix   . Vitamin D deficiency     Patient Active Problem List   Diagnosis Date Noted  . 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 S/P abdominal supracervical subtotal hysterectomy 06/21/2017  . Pelvic adhesions 06/09/2017  . History of ovarian cyst 06/09/2017  . Right ovarian cyst 04/25/2017  . Encounter for gynecological examination with Papanicolaou smear of cervix 04/19/2017  . Rectal bleeding 04/02/2017  . LLQ pain 04/01/2017  . Constipation 04/01/2017  . Concussion with loss of consciousness 11/16/2016  . Anxiety 10/14/2016  . Chronic  low back pain 05/29/2015  . Common migraine 12/19/2014  . Fibromyalgia 12/19/2014  . Rebound headache 12/19/2014  . Depression 12/11/2014  . Body aches 12/11/2014    Past Surgical History:  Procedure Laterality Date  . BILATERAL SALPINGECTOMY Bilateral 06/21/2017   Procedure: BILATERAL SALPINGECTOMY;  Surgeon: Tilda Burrow, MD;  Location: [facility] ORS;  Service: Gynecology;  Laterality: Bilateral;  . CESAREAN SECTION  [phone]  . CHOLECYSTECTOMY  2001  . COLONOSCOPY WITH PROPOFOL N/A 04/07/2017   Procedure: COLONOSCOPY WITH PROPOFOL;  Surgeon: Corbin Ade, MD;  Location: [facility] ENDO SUITE;  Service: Endoscopy;  Laterality: N/A;  2:15pm  . ENDOMETRIAL ABLATION    . SUPRACERVICAL ABDOMINAL HYSTERECTOMY N/A 06/21/2017   Procedure: HYSTERECTOMY SUPRACERVICAL ABDOMINAL;  Surgeon: Tilda Burrow, MD;  Location: [facility] ORS;  Service: Gynecology;  Laterality: N/A;  . TUBAL LIGATION      OB History    Gravida Para Term Preterm AB Living   3 3 3     3    SAB TAB Ectopic Multiple Live Births           3       Home Medications    Prior to Admission medications   Medication Sig Start Date End Date Taking? Authorizing Provider  ALPRAZolam Prudy Feeler) 0.5 MG tablet Take 1 tablet (0.5 mg  total) by mouth 2 (two) times daily as needed for anxiety. 10/14/16   Adline PotterGriffin, Jennifer A, NP  Black Cohosh-Soy-Ginkgo-Magnol (ESTROVEN MOOD & MEMORY PO) Take 1 tablet by mouth at bedtime.    [provider]  cetirizine HCl (ZYRTEC) 1 MG/ML solution Take 10 mg by mouth at bedtime.    [provider]  desvenlafaxine (PRISTIQ) 100 MG 24 hr tablet Take 100 mg by mouth at bedtime.     [provider]  ondansetron (ZOFRAN ODT) 4 MG disintegrating tablet Take 1 tablet (4 mg total) by mouth every 8 (eight) hours as needed for nausea or vomiting. 08/24/17   York SpanielWillis, Charles K, MD  rizatriptan (MAXALT-MLT) 10 MG disintegrating tablet Take 1 tablet (10 mg total) by mouth 3 (three) times daily as  needed for migraine. 08/24/17   York SpanielWillis, Charles K, MD  tapentadol (NUCYNTA) 50 MG tablet Take 1 tablet (50 mg total) by mouth every 6 (six) hours as needed. Must last 28 days 10/13/17   York SpanielWillis, Charles K, MD  tiZANidine (ZANAFLEX) 2 MG tablet TAKE 1 TABLET BY MOUTH TWICE DAILY, THEN TAKE 2 TABLETS EVERY EVENING 08/18/17   York SpanielWillis, Charles K, MD  traZODone (DESYREL) 100 MG tablet TAKE 1 TABLET(100 MG) BY MOUTH AT BEDTIME 10/26/17   York SpanielWillis, Charles K, MD  varenicline (CHANTIX) 1 MG tablet Take 1 mg by mouth 2 (two) times daily.    [provider]    Family History Family History  Problem Relation Age of Onset  . Hypertension Mother   . Other Mother        abnormal cells; had hyst  . Obesity Daughter   . Diabetes Daughter [id number]       youngest  . Obesity Son   . Cancer Maternal Grandmother   . Osteoporosis Maternal Grandmother   . Cancer Maternal Grandfather   . Obesity Daughter   . Diabetes Father   . Hypertension Father   . Alcohol abuse Father   . Colon cancer Neg Hx     Social History Social History   Tobacco Use  . Smoking status: Current Every Day Smoker    Packs/day: 0.50    Years: 23.00    Pack years: 11.50    Types: Cigarettes  . Smokeless tobacco: Never Used  Substance Use Topics  . Alcohol use: No  . Drug use: No     Allergies   Wellbutrin [bupropion]; Ciprofloxacin; Cymbalta [duloxetine hcl]; Meloxicam; Savella [milnacipran]; Topamax [topiramate]; Doxycycline; Gabapentin; Lyrica [pregabalin]; Nortriptyline; and Percocet [oxycodone-acetaminophen]   Review of Systems Review of Systems  Constitutional: Negative for activity change.       All ROS Neg except as noted in HPI  HENT: Negative for nosebleeds.   Eyes: Negative for photophobia and discharge.  Respiratory: Negative for cough, shortness of breath and wheezing.   Cardiovascular: Negative for chest pain and palpitations.  Gastrointestinal: Negative for abdominal pain and blood in stool.    Genitourinary: Negative for dysuria, frequency and hematuria.  Musculoskeletal: Negative for arthralgias, back pain and neck pain.  Skin: Positive for wound.       Abscess  Neurological: Negative for dizziness, seizures and speech difficulty.  Psychiatric/Behavioral: Negative for confusion and hallucinations.     Physical Exam Updated Vital Signs BP (!) 140/104 (BP Location: Right Arm) Comment: Pt states she is unable to sit for BP due to location of abscess  Pulse 98   Temp 98.4 F (36.9 C) (Oral)   Resp 18   Ht 5\' 4"  (  1.626 m)   Wt 90.7 kg (200 lb)   SpO2 99%   BMI 34.33 kg/m   Physical Exam  Constitutional: She is oriented to person, place, and time. She appears well-developed and well-nourished.  Non-toxic appearance.  HENT:  Head: Normocephalic.  Right Ear: Tympanic membrane and external ear normal.  Left Ear: Tympanic membrane and external ear normal.  Eyes: EOM and lids are normal. Pupils are equal, round, and reactive to light.  Neck: Normal range of motion. Neck supple. Carotid bruit is not present.  Cardiovascular: Normal rate, regular rhythm, normal heart sounds, intact distal pulses and normal pulses.  Pulmonary/Chest: Breath sounds normal. No respiratory distress.  Abdominal: Soft. Bowel sounds are normal. There is no tenderness. There is no guarding.  Genitourinary:  Genitourinary Comments: Chaperone present during the examination.  The patient has an abscess on the inner aspect of the right buttocks.  There is purulent drainage from the area.  There are no red streaks appreciated.  The anus is not involved.  The vagina is not involved.  There is extreme tenderness at the abscess area.  Musculoskeletal: Normal range of motion.  Lymphadenopathy:       Head (right side): No submandibular adenopathy present.       Head (left side): No submandibular adenopathy present.    She has no cervical adenopathy.  Neurological: She is alert and oriented to person, place, and  time. She has normal strength. No cranial nerve deficit or sensory deficit.  Skin: Skin is warm and dry.  Psychiatric: She has a normal mood and affect. Her speech is normal.  Nursing note and vitals reviewed.    ED Treatments / Results  Labs (all labs ordered are listed, but only abnormal results are displayed) Labs Reviewed  AEROBIC CULTURE (SUPERFICIAL SPECIMEN)    EKG  EKG Interpretation None       Radiology No results found.  Procedures .Marland Kitchen.Incision and Drainage Date/Time: 10/27/2017 9:45 PM Performed by: Ivery QualeBryant, Gloristine Turrubiates, PA-C Authorized by: Ivery QualeBryant, Laquinn Shippy, PA-C   Consent:    Consent obtained:  Verbal   Consent given by:  Patient   Risks discussed:  Bleeding, incomplete drainage, infection and pain Location:    Type:  Abscess   Location:  Anogenital   Anogenital location: right buttock. Pre-procedure details:    Skin preparation:  Betadine Anesthesia (see MAR for exact dosages):    Anesthesia method:  Local infiltration   Local anesthetic:  Bupivacaine 0.25% w/o epi Procedure type:    Complexity:  Simple Procedure details:    Incision types:  Single straight   Incision depth:  Subcutaneous   Scalpel blade:  11   Wound management:  Probed and deloculated, irrigated with saline and extensive cleaning   Drainage:  Bloody and purulent   Drainage amount:  Moderate   Wound treatment:  Wound left open   Packing materials:  None Post-procedure details:    Patient tolerance of procedure:  Tolerated well, no immediate complications   (including critical care time)  Medications Ordered in ED Medications  povidone-iodine (BETADINE) 10 % external solution (not administered)  doxycycline (VIBRA-TABS) tablet 100 mg (not administered)  cephALEXin (KEFLEX) capsule 500 mg (not administered)  bupivacaine (PF) (MARCAINE) 0.25 % injection 20 mL (not administered)  HYDROcodone-acetaminophen (NORCO/VICODIN) 5-325 MG per tablet 2 tablet (not administered)  promethazine  (PHENERGAN) tablet 12.5 mg (not administered)     Initial Impression / Assessment and Plan / ED Course  I have reviewed the triage vital signs and the  nursing notes.  Pertinent labs & imaging results that were available during my care of the patient were reviewed by me and considered in my medical decision making (see chart for details).       Final Clinical Impressions(s) / ED Diagnoses MDM The blood pressure is elevated at 140/4, the remainder of the vital signs are well within normal limits.  The pulse oximetry is 99% on limits by my interpretation.  The patient is noted to have an abscess on the inner aspect of the right buttocks.  There is no red streak noted.  Incision and drainage was carried out, culture was sent to the lab.  The anus nor the vagina were involved.  The patient will use warm Epson salt soaks daily.  She will change the dressing daily.  She will be treated with clindamycin and Norco for pain.  The patient is to see her primary physician or return to the emergency department if not improving.   Final diagnoses:  Abscess of buttock, right    ED Discharge Orders        Ordered    clindamycin (CLEOCIN) 150 MG capsule     10/27/17 2150    HYDROcodone-acetaminophen (NORCO/VICODIN) 5-325 MG tablet  Every 4 hours PRN     10/27/17 2150       Ivery Quale, PA-C 10/27/17 2153    Vanetta Mulders, MD 10/27/17 2332

## 2017-10-27 NOTE — ED Notes (Signed)
Patient educated about not driving or performing other critical tasks (such as operating heavy machinery, caring for infant/toddler/child) due to sedative nature of narcotic medications received while in the ED.  Pt/caregiver verbalized understanding.   

## 2017-10-27 NOTE — ED Notes (Signed)
ED Provider at bedside for procedure. 

## 2017-10-27 NOTE — ED Triage Notes (Signed)
Patient reports of abscess to right buttocks x 3 days. Patient reports of drainage but still tender.

## 2017-10-27 NOTE — Discharge Instructions (Signed)
Your blood pressure was elevated upon admission to the emergency department at 140/104.  This is probably related to you being uncomfortable, but please have this rechecked soon.  Please use warm tub soaks for about 15 minutes with Epson salt daily until the wound heals from the inside out.  Please change dressing daily.  Use 2 tablets of clindamycin 2 times daily with food.  Use Tylenol for mild pain, use Norco for more severe pain.  Norco may cause drowsiness, and/or constipation.  Please use this medication with caution.  Please see Dr. Margo AyeHall, or return to the emergency department if any changes, problems, or concerns.

## 2017-10-27 NOTE — ED Notes (Signed)
ED Provider at bedside. 

## 2017-10-30 LAB — AEROBIC CULTURE W GRAM STAIN (SUPERFICIAL SPECIMEN)
Culture: NO GROWTH
Special Requests: NORMAL

## 2017-10-30 LAB — AEROBIC CULTURE  (SUPERFICIAL SPECIMEN)

## 2017-11-09 ENCOUNTER — Ambulatory Visit: Payer: Medicaid Other | Admitting: Neurology

## 2017-11-09 ENCOUNTER — Encounter: Payer: Self-pay | Admitting: Neurology

## 2017-11-09 VITALS — BP 119/79 | HR 69 | Ht 64.0 in | Wt 201.0 lb

## 2017-11-09 DIAGNOSIS — G8929 Other chronic pain: Secondary | ICD-10-CM | POA: Diagnosis not present

## 2017-11-09 DIAGNOSIS — G43019 Migraine without aura, intractable, without status migrainosus: Secondary | ICD-10-CM

## 2017-11-09 DIAGNOSIS — M797 Fibromyalgia: Secondary | ICD-10-CM

## 2017-11-09 DIAGNOSIS — M545 Low back pain, unspecified: Secondary | ICD-10-CM

## 2017-11-09 MED ORDER — TAPENTADOL HCL 50 MG PO TABS: 50.0000 mg | ORAL_TABLET | Freq: Four times a day (QID) | ORAL | 0 refills | Status: DC | PRN

## 2017-11-09 NOTE — Progress Notes (Signed)
Reason for visit: Migraine headache, back pain  Mary Parker is an 37 y.o. female  History of present illness:  Mary Parker is a 37 year old right-handed white female with a history of migraine headaches and a psychogenic left hemisensory deficit.  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  The patient has had some increased pain in this region recently.  She also has some posterior thigh pain on the right that has developed, she has right knee arthritis, she has been told that she needs surgery for this but she has tried to postpone this as long as possible.  The patient is having 2 or 3 migraine headaches a week, the Maxalt is very effective for the headache. Reducing chocolate intake has further reduced the frequency of her headache. She is working currently and doing well with this.  Her new job requires that she stays on her feet throughout the day.  She believes that this is worsening her back problem.  Past Medical History:  Diagnosis Date  . Anxiety   . Arthritis    both knees  . Body aches 12/11/2014  . Chronic insomnia   . Chronic low back pain 05/29/2015  . Common migraine 12/19/2014  . Depression 12/11/2014  . Fibrocystic disease of both breasts   . Fibromyalgia 12/19/2014  . IBS (irritable bowel syndrome)   . Migraines   . Rebound headache 12/19/2014  . Right ovarian cyst 04/25/2017   Complex, check CA 125 and repeat US in 6 weeks   . Sciatica of left side   . Seizures (HCC)    remote past, felt it was related to Chantix   . Vitamin D deficiency     Past Surgical History:  Procedure Laterality Date  . BILATERAL SALPINGECTOMY Bilateral 06/21/2017   Procedure: BILATERAL SALPINGECTOMY;  Surgeon: Tilda BurrowFerguson, John V, MD;  Location: [facility] OR[facility];  Service: Gynecology;  Laterality: Bilateral;  . CESAREAN SECTION  [phone],2000,2004  . CHOLECYSTECTOMY  2001  . COLONOSCOPY WITH PROPOFOL N/A 04/07/2017   Procedure: COLONOSCOPY  WITH PROPOFOL;  Surgeon: Corbin Adeourk, Robert M, MD;  Location: [facility] END[facility] SU[facility];  Service: Endoscopy;  Laterality: N/A;  2:15pm  . ENDOMETRIAL ABLATION    . SUPRACERVICAL ABDOMINAL HYSTERECTOMY N/A 06/21/2017   Procedure: HYSTERECTOMY SUPRACERVICAL ABDOMINAL;  Surgeon: Tilda BurrowFerguson, John V, MD;  Location: [facility];  Service: Gynecology;  Laterality: N/A;  . TUBAL LIGATION      Family History  Problem Relation Age of Onset  . Hypertension Mother   . Other Mother        abnormal cells; had hyst  . Obesity Daughter   . Diabetes Daughter 5813       youngest  . Obesity Son   . Cancer Maternal Grandmother   . Osteoporosis Maternal Grandmother   . Cancer Maternal Grandfather   . Obesity Daughter   . Diabetes Father   . Hypertension Father   . Alcohol abuse Father   . Colon cancer Neg Hx     Social history:  reports that she has quit smoking. Her smoking use included cigarettes. She has a 11.50 pack-year smoking history. she has never used smokeless tobacco. She reports that she does not drink alcohol or use drugs.    Allergies  Allergen Reactions  . Wellbutrin [Bupropion] Other (See Comments)    Suicidal ideation  . Ciprofloxacin Hives, Swelling and Other (See Comments)    Sweating, dizziness  . Cymbalta [Duloxetine  Hcl] Hives, Itching and Rash  . Meloxicam Other (See Comments)    Abdominal cramping and mood changes   . Savella [Milnacipran] Other (See Comments)    Dizziness and nausea, leg cramps  . Topamax [Topiramate] Swelling and Other (See Comments)    Dizziness and slurred speech  . Pristiq [Desvenlafaxine Succinate Er]     UTI  . Doxycycline Nausea Only  . Gabapentin Itching  . Lyrica [Pregabalin] Other (See Comments)    Tremors   . Nortriptyline Other (See Comments)    GERD  . Percocet [Oxycodone-Acetaminophen] Nausea Only    Medications:  Prior to Admission medications   Medication Sig Start Date End Date Taking? Authorizing Provider  ALPRAZolam Prudy Feeler) 0.5 MG tablet Take 1  tablet (0.5 mg total) by mouth 2 (two) times daily as needed for anxiety. 10/14/16  Yes Cyril Mourning A, NP  cetirizine HCl (ZYRTEC) 1 MG/ML solution Take 10 mg by mouth at bedtime.   Yes [provider]  clindamycin (CLEOCIN) 150 MG capsule 2 po bid with food 10/27/17  Yes Ivery Quale, PA-C  escitalopram (LEXAPRO) 20 MG tablet Take 20 mg by mouth daily. 10/31/17  Yes [provider]  ondansetron (ZOFRAN ODT) 4 MG disintegrating tablet Take 1 tablet (4 mg total) by mouth every 8 (eight) hours as needed for nausea or vomiting. 08/24/17  Yes York Spaniel, MD  rizatriptan (MAXALT-MLT) 10 MG disintegrating tablet Take 1 tablet (10 mg total) by mouth 3 (three) times daily as needed for migraine. 08/24/17  Yes York Spaniel, MD  tapentadol (NUCYNTA) 50 MG tablet Take 1 tablet (50 mg total) by mouth every 6 (six) hours as needed. Must last 28 days 10/13/17  Yes York Spaniel, MD  tiZANidine (ZANAFLEX) 2 MG tablet TAKE 1 TABLET BY MOUTH TWICE DAILY, THEN TAKE 2 TABLETS EVERY EVENING 08/18/17  Yes York Spaniel, MD  traZODone (DESYREL) 100 MG tablet TAKE 1 TABLET(100 MG) BY MOUTH AT BEDTIME 10/26/17  Yes York Spaniel, MD  varenicline (CHANTIX) 1 MG tablet Take 1 mg by mouth 2 (two) times daily.   Yes [provider]    ROS:  Out of a complete 14 system review of symptoms, the patient complains only of the following symptoms, and all other reviewed systems are negative.  Fatigue Ear pain Urinary urgency Joint pain, joint swelling, back pain, aching muscles, neck pain, neck stiffness Snoring Bruising easily Headache Depression, anxiety  Blood pressure 119/79, pulse 69, height 5\' 4"  (1.626 m), weight 201 lb (91.2 kg).  Physical Exam  General: The patient is alert and cooperative at the time of the examination.  The patient is markedly obese.  Neuromuscular: Range of movement low back is full.  Skin: No significant peripheral edema is  noted.   Neurologic Exam  Mental status: The patient is alert and oriented x 3 at the time of the examination. The patient has apparent normal recent and remote memory, with an apparently normal attention span and concentration ability.   Cranial nerves: Facial symmetry is present. Speech is normal, no aphasia or dysarthria is noted. Extraocular movements are full. Visual fields are full.  Motor: The patient has good strength in all 4 extremities.  Sensory examination: Soft touch sensation is symmetric on the face, arms, and legs.  Coordination: The patient has decreased soft touch sensation on the left face, arm, and leg.  Gait and station: The patient has a normal gait. Tandem gait is normal. Romberg is negative. No drift  is seen.  Reflexes: Deep tendon reflexes are symmetric.   Assessment/Plan:  1.  Chronic low back pain, SI joint dysfunction  2.  Migraine headache  The patient will go up on the Nucyta, she will get 50 tablets a month.  A prescription was written today.  The patient will get trigger point injections today, she may get another SI joint injection in the near future.  She will follow-up in 5 months.  Marlan Palau MD 11/09/2017 2:06 PM  [facility] [address] [place], [place] [address]  Phone [phone] Fax [phone]

## 2017-11-09 NOTE — Procedures (Signed)
    Mary LiasCasey Rauch is a 37 year old patient with a history of migraine headaches and bilateral SI joint dysfunction.  The patient has left low back pain and right posterior thigh pain.  The patient comes in today for trigger point injections of these areas.  0.5% bupivacaine injections were done.  2 injections were done around the left SI joint of 1 cc each, 3 injections were done in the posterior medial right thigh 1 inch, 3 inches, and 5 inches above the knee area.  1 cc was injected into each area.   The patient tolerated the procedure well, no complications of the procedure were noted.  0.5% bupivacaine was used, 250 mg per 50 cc.   NDC [id number] Batch: [id number] Expiration: March 2020

## 2017-12-23 ENCOUNTER — Telehealth: Payer: Self-pay

## 2017-12-23 ENCOUNTER — Encounter: Payer: Self-pay | Admitting: Neurology

## 2017-12-23 ENCOUNTER — Other Ambulatory Visit: Payer: Self-pay | Admitting: Neurology

## 2017-12-23 MED ORDER — TAPENTADOL HCL 50 MG PO TABS: 50.0000 mg | ORAL_TABLET | Freq: Four times a day (QID) | ORAL | 0 refills | Status: DC | PRN

## 2017-12-23 NOTE — Telephone Encounter (Signed)
-----   Message from York Spanielharles K Willis, MD sent at 12/23/2017  9:15 AM EST ----- This patient wants to come in sometime next week for trigger point injections, okay to work the patient in sometime.

## 2017-12-26 ENCOUNTER — Telehealth: Payer: Self-pay | Admitting: *Deleted

## 2017-12-26 NOTE — Telephone Encounter (Signed)
Pt scheduled tomorrow morning at 730am with Dr. Anne HahnWillis for trigger point injections.

## 2017-12-26 NOTE — Telephone Encounter (Signed)
Called pt, scheduled work in appt for tomorrow at 730am for trigger point inj. Pt agreeable to date/time. Advised her to check in 715am.

## 2017-12-27 ENCOUNTER — Ambulatory Visit (INDEPENDENT_AMBULATORY_CARE_PROVIDER_SITE_OTHER): Payer: Medicaid Other | Admitting: Neurology

## 2017-12-27 ENCOUNTER — Encounter: Payer: Self-pay | Admitting: Neurology

## 2017-12-27 VITALS — BP 127/77 | HR 80 | Ht 63.0 in | Wt 204.0 lb

## 2017-12-27 DIAGNOSIS — M545 Low back pain, unspecified: Secondary | ICD-10-CM

## 2017-12-27 DIAGNOSIS — M797 Fibromyalgia: Secondary | ICD-10-CM | POA: Diagnosis not present

## 2017-12-27 DIAGNOSIS — G8929 Other chronic pain: Secondary | ICD-10-CM | POA: Diagnosis not present

## 2017-12-27 NOTE — Progress Notes (Signed)
Please refer to procedure note

## 2017-12-27 NOTE — Procedures (Signed)
     History: Mary LiasCasey Mood is a 37 year old patient with a history of fibromyalgia.  The patient has SI joint discomfort bilaterally, at this point she is complaining mainly of the left side with discomfort.  She also has trigger points, she is having issues with pain in the intrascapular areas.  She comes in for trigger point injections today.  Description of procedure: 0.5% bupivacaine was used as local anesthetic.  1 cc was injected bilaterally, at 3 locations in the upper, mid, and lower intrascapular area in the paraspinal regions.  3 injections of 1 cc bupivacaine was also given in a 6 cm area around the left SI joint.  The patient tolerated the procedure well, no complications were noted.  0.5% bupivacaine was used, NDC number [id number]  Expiration date is January 30, 2018.  Lot number is [id number]

## 2018-01-30 ENCOUNTER — Other Ambulatory Visit: Payer: Self-pay | Admitting: Neurology

## 2018-01-30 ENCOUNTER — Encounter: Payer: Self-pay | Admitting: Neurology

## 2018-01-30 MED ORDER — TAPENTADOL HCL 50 MG PO TABS: 50.0000 mg | ORAL_TABLET | Freq: Four times a day (QID) | ORAL | 0 refills | Status: DC | PRN

## 2018-01-31 ENCOUNTER — Telehealth: Payer: Self-pay | Admitting: *Deleted

## 2018-01-31 NOTE — Telephone Encounter (Signed)
Initiated PA Nucynta. In process of completing.

## 2018-01-31 NOTE — Telephone Encounter (Signed)
Faxed completed/signed PA form to [employer] at [phone]. Received fax confirmation. Waiting on determination.

## 2018-02-01 NOTE — Telephone Encounter (Addendum)
Called [employer] to check on status of PA. Interaction ID# for call: [id number]. They had not received our fax request yesterday even though we received a fax confirmation. I verified fax#. She asked that we re-send. I marked as urgent/2nd request and advised them to note change in qty per 30 days (50tab/30day). Fax: [phone]. Received fax confirmation. Waiting on determination.

## 2018-02-02 NOTE — Telephone Encounter (Signed)
Checked status of PA. PA approved 02/01/18-07/31/18 for qty50/30days. WU#[id number]#[id number].   Faxed notice of approval to [facility] [place], [place] at [phone]. Received fax confirmation.

## 2018-02-15 ENCOUNTER — Other Ambulatory Visit: Payer: Self-pay | Admitting: Neurology

## 2018-02-20 ENCOUNTER — Encounter: Payer: Self-pay | Admitting: Neurology

## 2018-02-23 ENCOUNTER — Emergency Department (HOSPITAL_COMMUNITY)
Admission: EM | Admit: 2018-02-23 | Discharge: 2018-02-23 | Disposition: A | Discharge status: Home / Self Care | Payer: Medicaid Other | Attending: Emergency Medicine | Admitting: Emergency Medicine

## 2018-02-23 ENCOUNTER — Other Ambulatory Visit: Payer: Self-pay

## 2018-02-23 ENCOUNTER — Encounter (HOSPITAL_COMMUNITY): Payer: Self-pay | Admitting: Emergency Medicine

## 2018-02-23 ENCOUNTER — Emergency Department (HOSPITAL_COMMUNITY): Payer: Medicaid Other

## 2018-02-23 DIAGNOSIS — J040 Acute laryngitis: Secondary | ICD-10-CM | POA: Diagnosis not present

## 2018-02-23 DIAGNOSIS — R062 Wheezing: Secondary | ICD-10-CM | POA: Insufficient documentation

## 2018-02-23 DIAGNOSIS — J029 Acute pharyngitis, unspecified: Secondary | ICD-10-CM | POA: Diagnosis present

## 2018-02-23 DIAGNOSIS — Z87891 Personal history of nicotine dependence: Secondary | ICD-10-CM | POA: Diagnosis not present

## 2018-02-23 DIAGNOSIS — Z79899 Other long term (current) drug therapy: Secondary | ICD-10-CM | POA: Diagnosis not present

## 2018-02-23 MED ORDER — BENZONATATE 100 MG PO CAPS: 100.0000 mg | ORAL_CAPSULE | Freq: Three times a day (TID) | ORAL | 0 refills | Status: DC | PRN

## 2018-02-23 MED ORDER — ALBUTEROL SULFATE (2.5 MG/3ML) 0.083% IN NEBU
5.0000 mg | INHALATION_SOLUTION | Freq: Once | RESPIRATORY_TRACT | Status: AC
  Administered 2018-02-23: 5 mg via RESPIRATORY_TRACT
  Filled 2018-02-23: qty 6

## 2018-02-23 MED ORDER — ALBUTEROL SULFATE HFA 108 (90 BASE) MCG/ACT IN AERS
1.0000 | INHALATION_SPRAY | RESPIRATORY_TRACT | Status: DC | PRN
  Administered 2018-02-23: 2 via RESPIRATORY_TRACT
  Filled 2018-02-23: qty 6.7

## 2018-02-23 MED ORDER — DEXAMETHASONE SODIUM PHOSPHATE 10 MG/ML IJ SOLN
10.0000 mg | Freq: Once | INTRAMUSCULAR | Status: AC
  Administered 2018-02-23: 10 mg via INTRAMUSCULAR
  Filled 2018-02-23: qty 1

## 2018-02-23 NOTE — ED Triage Notes (Signed)
Pt has written note stating she is unable to talk, note states she was seen by PCP yesterday and started on Prednisone for sore throat, swollen tonsils, cough, strep test yesterday was negative

## 2018-02-23 NOTE — ED Notes (Signed)
Respiratory paged at this time for tx.  

## 2018-02-23 NOTE — ED Provider Notes (Signed)
[facility] EMERGENCY DEPARTMENT Provider Note   CSN: [id number] Arrival date & time: 02/23/18  1204     History   Chief Complaint Chief Complaint  Patient presents with  . Sore Throat    HPI Mary Parker is a 37 y.o. female.  HPI Patient presents with 3 days of nasal congestion, sore throat, nonproductive cough, low-grade fever and lymphadenopathy.  Lost her voice yesterday.  Was seen by her primary doctor and had strep test performed which was negative. Past Medical History:  Diagnosis Date  . Anxiety   . Arthritis    both knees  . Body aches 12/11/2014  . Chronic insomnia   . Chronic low back pain 05/29/2015  . Common migraine 12/19/2014  . Depression 12/11/2014  . Fibrocystic disease of both breasts   . Fibromyalgia 12/19/2014  . IBS (irritable bowel syndrome)   . Migraines   . Rebound headache 12/19/2014  . Right ovarian cyst 04/25/2017   Complex, check CA 125 and repeat US in 6 weeks   . Sciatica of left side   . Seizures (HCC)    remote past, felt it was related to Chantix   . Vitamin D deficiency     Patient Active Problem List   Diagnosis Date Noted  . 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 S/P abdominal supracervical subtotal hysterectomy 06/21/2017  . Pelvic adhesions 06/09/2017  . History of ovarian cyst 06/09/2017  . Right ovarian cyst 04/25/2017  . Encounter for gynecological examination with Papanicolaou smear of cervix 04/19/2017  . Rectal bleeding 04/02/2017  . LLQ pain 04/01/2017  . Constipation 04/01/2017  . Concussion with loss of consciousness 11/16/2016  . Anxiety 10/14/2016  . Chronic low back pain 05/29/2015  . Common migraine 12/19/2014  . Fibromyalgia 12/19/2014  . Rebound headache 12/19/2014  . Depression 12/11/2014  . Body aches 12/11/2014    Past Surgical History:  Procedure Laterality Date  . BILATERAL SALPINGECTOMY Bilateral 06/21/2017   Procedure: BILATERAL SALPINGECTOMY;  Surgeon: Tilda Burrow, MD;  Location: [facility];   Service: Gynecology;  Laterality: Bilateral;  . CESAREAN SECTION  [phone]  . CHOLECYSTECTOMY  2001  . COLONOSCOPY WITH PROPOFOL N/A 04/07/2017   Procedure: COLONOSCOPY WITH PROPOFOL;  Surgeon: Corbin Ade, MD;  Location: [facility] END[facility] SUITE;  Service: Endoscopy;  Laterality: N/A;  2:15pm  . ENDOMETRIAL ABLATION    . SUPRACERVICAL ABDOMINAL HYSTERECTOMY N/A 06/21/2017   Procedure: HYSTERECTOMY SUPRACERVICAL ABDOMINAL;  Surgeon: Tilda Burrow, MD;  Location: [facility];  Service: Gynecology;  Laterality: N/A;  . TUBAL LIGATION       OB History    Gravida  3   Para  3   Term  3   Preterm      AB      Living  3     SAB      TAB      Ectopic      Multiple      Live Births  3            Home Medications    Prior to Admission medications   Medication Sig Start Date End Date Taking? Authorizing Provider  albuterol (PROVENTIL) (2.5 MG/3ML) 0.083% nebulizer solution VVN Q 6 H PRN 02/21/18  Yes [provider]  ALPRAZolam (XANAX) 0.5 MG tablet Take 1 tablet (0.5 mg total) by mouth 2 (two) times daily as needed for anxiety. 10/14/16  Yes Cyril Mourning A, NP  amoxicillin-clavulanate (AUGMENTIN) 875-125 MG tablet TK 1  T PO Q 12 H 02/22/18  Yes [provider]  Butalbital-APAP-Caffeine 50-325-40 MG capsule TK 1 C PO Q 6 H PRF 30 DAYS 02/21/18  Yes [provider]  cetirizine HCl (ZYRTEC) 1 MG/ML solution Take 10 mg by mouth at bedtime.   Yes [provider]  escitalopram (LEXAPRO) 20 MG tablet Take 20 mg by mouth daily. 10/31/17  Yes [provider]  ondansetron (ZOFRAN ODT) 4 MG disintegrating tablet Take 1 tablet (4 mg total) by mouth every 8 (eight) hours as needed for nausea or vomiting. 08/24/17  Yes York Spaniel, MD  predniSONE (DELTASONE) 10 MG tablet  02/22/18  Yes [provider]  tapentadol (NUCYNTA) 50 MG tablet Take 1 tablet (50 mg total) by mouth every 6 (six) hours as needed. Must last 28 days 01/30/18  Yes  York Spaniel, MD  tiZANidine (ZANAFLEX) 2 MG tablet TAKE 1 TABLET BY MOUTH TWICE DAILY, THEN TAKE 2 TABLETS EVERY EVENING Patient taking differently: TAKE 1 TABLET BY MOUTH TWICE DAILY, THEN TAKE 2 TABLETS EVERY EVENING as needed 08/18/17  Yes York Spaniel, MD  traZODone (DESYREL) 50 MG tablet Take 50 mg by mouth at bedtime.   Yes [provider]  Vitamin D, Ergocalciferol, (DRISDOL) 50000 units CAPS capsule TK 1 C PO 1 TIME A WK FOR 12 WKS 02/18/18  Yes [provider]  benzonatate (TESSALON) 100 MG capsule Take 1 capsule (100 mg total) by mouth 3 (three) times daily as needed for cough. 02/23/18   Loren Racer, MD  cyanocobalamin (,VITAMIN B-12,) 1000 MCG/ML injection INJECT 1 ML INTO THE SKIN ONCE A MONTH. 02/17/18   [provider]  levocetirizine (XYZAL) 5 MG tablet TK 1 T PO QHS 02/01/18   [provider]  rizatriptan (MAXALT-MLT) 10 MG disintegrating tablet DISSOLVE 1 TABLET(10 MG) ON THE TONGUE THREE TIMES DAILY AS NEEDED FOR MIGRAINE 02/15/18   York Spaniel, MD  varenicline (CHANTIX) 1 MG tablet Take 1 mg by mouth daily.     [provider]    Family History Family History  Problem Relation Age of Onset  . Hypertension Mother   . Other Mother        abnormal cells; had hyst  . Obesity Daughter   . Diabetes Daughter 67       youngest  . Obesity Son   . Cancer Maternal Grandmother   . Osteoporosis Maternal Grandmother   . Cancer Maternal Grandfather   . Obesity Daughter   . Diabetes Father   . Hypertension Father   . Alcohol abuse Father   . Colon cancer Neg Hx     Social History Social History   Tobacco Use  . Smoking status: Former Smoker    Packs/day: 0.50    Years: 23.00    Pack years: 11.50    Types: Cigarettes  . Smokeless tobacco: Never Used  Substance Use Topics  . Alcohol use: No  . Drug use: No     Allergies   Wellbutrin [bupropion]; Ciprofloxacin; Cymbalta [duloxetine hcl]; Meloxicam; Savella  [milnacipran]; Topamax [topiramate]; Pristiq [desvenlafaxine succinate er]; Doxycycline; Gabapentin; Lyrica [pregabalin]; Nortriptyline; and Percocet [oxycodone-acetaminophen]   Review of Systems Review of Systems  Constitutional: Positive for chills and fever.  HENT: Positive for congestion, sinus pressure, sinus pain, sore throat and voice change.   Respiratory: Positive for cough, shortness of breath and wheezing. Negative for chest tightness.   Cardiovascular: Negative for chest pain.  Gastrointestinal: Negative for abdominal pain, diarrhea, nausea  and vomiting.  Musculoskeletal: Positive for neck pain. Negative for back pain and neck stiffness.  Skin: Negative for rash and wound.  Neurological: Positive for headaches. Negative for dizziness, weakness and numbness.  All other systems reviewed and are negative.    Physical Exam Updated Vital Signs BP 121/69   Pulse 87   Temp 98.4 F (36.9 C) (Oral)   Resp 18   Wt 92.5 kg (204 lb)   SpO2 100%   BMI 36.14 kg/m   Physical Exam  Constitutional: She is oriented to person, place, and time. She appears well-developed and well-nourished. No distress.  HENT:  Head: Normocephalic and atraumatic.  Mouth/Throat: Oropharynx is clear and moist. No oropharyngeal exudate.  Oropharynx is erythematous.  No tonsillar hypertrophy identified.  No exudates.  Uvula is midline.  Bilateral nasal mucosal edema.  Diffuse sinus tenderness to percussion.  Eyes: Pupils are equal, round, and reactive to light. EOM are normal.  Neck: Normal range of motion. Neck supple.  Anterior cervical lymphadenopathy.  No stridor.  No meningismus.  Cardiovascular: Normal rate and regular rhythm. Exam reveals no gallop and no friction rub.  No murmur heard. Pulmonary/Chest: Effort normal and breath sounds normal. No stridor. No respiratory distress. She has no wheezes. She has no rales. She exhibits no tenderness.  Hacking cough.  No respiratory distress.  Patient is  not attempting to speak.  Abdominal: Soft. Bowel sounds are normal. There is no tenderness. There is no rebound and no guarding.  Musculoskeletal: Normal range of motion. She exhibits no edema or tenderness.  No lower extremity swelling, asymmetry or tenderness.  Lymphadenopathy:    She has cervical adenopathy.  Neurological: She is alert and oriented to person, place, and time.  Moving all extremities without focal deficit.  Sensation intact.  Skin: Skin is warm and dry. No rash noted. She is not diaphoretic. No erythema.  Psychiatric: She has a normal mood and affect. Her behavior is normal.  Nursing note and vitals reviewed.    ED Treatments / Results  Labs (all labs ordered are listed, but only abnormal results are displayed) Labs Reviewed - No data to display  EKG None  Radiology Dg Neck Soft Tissue  Result Date: 02/23/2018 CLINICAL DATA:  Cough.  Shortness of breath.  Unable walk. EXAM: NECK SOFT TISSUES - 1+ VIEW COMPARISON:  None. FINDINGS: There is no evidence of retropharyngeal soft tissue swelling or epiglottic enlargement. The cervical airway is unremarkable and no radio-opaque foreign body identified. IMPRESSION: Radiograph of the soft tissues of neck. Electronically Signed   By: Marin Roberts M.D.   On: 02/23/2018 13:29   Dg Chest 2 View  Result Date: 02/23/2018 CLINICAL DATA:  Cough and shortness of breath.  Unable to talk. EXAM: CHEST - 2 VIEW COMPARISON:  Two-view chest x-ray 05/28/2016 FINDINGS: Heart size is normal. Mild peribronchial thickening noted bilaterally. No consolidated airspace disease present. Visualized soft tissues bony thorax are unremarkable. IMPRESSION: 1. Mild peribronchial thickening may be related to reactive bronchiolitis. No focal airspace disease present suggest alveolar disease or pneumonia. Electronically Signed   By: Marin Roberts M.D.   On: 02/23/2018 13:31    Procedures Procedures (including critical care  time)  Medications Ordered in ED Medications  albuterol (PROVENTIL HFA;VENTOLIN HFA) 108 (90 Base) MCG/ACT inhaler 1-2 puff (2 puffs Inhalation Given 02/23/18 1421)  dexamethasone (DECADRON) injection 10 mg (10 mg Intramuscular Given 02/23/18 1253)  albuterol (PROVENTIL) (2.5 MG/3ML) 0.083% nebulizer solution 5 mg (5 mg Nebulization Given 02/23/18 1325)  Initial Impression / Assessment and Plan / ED Course  I have reviewed the triage vital signs and the nursing notes.  Pertinent labs & imaging results that were available during my care of the patient were reviewed by me and considered in my medical decision making (see chart for details).     Patient is in no respiratory distress.  No evidence of airway compromise.  Likely upper respiratory infection with laryngitis and bronchitis.  X-rays of the neck with no acute findings.  Chest x-ray with peribronchial cuffing.  Patient taking prednisone as prescribed by PMD.  Given albuterol inhaler for bronchospasms as needed.  Also give Tessalon Perles for cough as needed.  Return precautions have been given.  Final Clinical Impressions(s) / ED Diagnoses   Final diagnoses:  Acute laryngitis    ED Discharge Orders        Ordered    benzonatate (TESSALON) 100 MG capsule  3 times daily PRN     02/23/18 1432       Loren RacerYelverton, Annya Lizana, MD 02/23/18 1434

## 2018-02-23 NOTE — ED Notes (Signed)
Per Dr. Ranae PalmsYelverton, patient given ice chips

## 2018-02-23 NOTE — ED Notes (Signed)
Pt reports she has not been able to speak for three days, was seen by PCP and given a steroid. No improvement per pt. Tonsils 3+. Pt only able to sip liquids, no food in 3 days.

## 2018-02-23 NOTE — ED Notes (Signed)
Sheets on bed changed due to accidental urination.

## 2018-02-23 NOTE — ED Notes (Signed)
Respiratory paged for inhaler

## 2018-02-28 ENCOUNTER — Encounter (HOSPITAL_COMMUNITY): Payer: Self-pay | Admitting: *Deleted

## 2018-02-28 ENCOUNTER — Emergency Department (HOSPITAL_COMMUNITY)
Admission: EM | Admit: 2018-02-28 | Discharge: 2018-02-28 | Disposition: A | Discharge status: Home / Self Care | Payer: Medicaid Other | Attending: Emergency Medicine | Admitting: Emergency Medicine

## 2018-02-28 ENCOUNTER — Emergency Department (HOSPITAL_COMMUNITY): Payer: Medicaid Other

## 2018-02-28 ENCOUNTER — Other Ambulatory Visit: Payer: Self-pay

## 2018-02-28 DIAGNOSIS — Z79899 Other long term (current) drug therapy: Secondary | ICD-10-CM | POA: Diagnosis not present

## 2018-02-28 DIAGNOSIS — R05 Cough: Secondary | ICD-10-CM | POA: Insufficient documentation

## 2018-02-28 DIAGNOSIS — R0602 Shortness of breath: Secondary | ICD-10-CM | POA: Diagnosis present

## 2018-02-28 DIAGNOSIS — J040 Acute laryngitis: Secondary | ICD-10-CM | POA: Diagnosis not present

## 2018-02-28 DIAGNOSIS — R070 Pain in throat: Secondary | ICD-10-CM | POA: Insufficient documentation

## 2018-02-28 DIAGNOSIS — E876 Hypokalemia: Secondary | ICD-10-CM | POA: Diagnosis not present

## 2018-02-28 DIAGNOSIS — Z87891 Personal history of nicotine dependence: Secondary | ICD-10-CM | POA: Diagnosis not present

## 2018-02-28 LAB — BASIC METABOLIC PANEL
Anion gap: 10 (ref 5–15)
BUN: 11 mg/dL (ref 6–20)
CHLORIDE: 99 mmol/L — AB (ref 101–111)
CO2: 29 mmol/L (ref 22–32)
CREATININE: 0.77 mg/dL (ref 0.44–1.00)
Calcium: 8.5 mg/dL — ABNORMAL LOW (ref 8.9–10.3)
GFR calc non Af Amer: >60 mL/min (ref >60)
Glucose, Bld: 102 mg/dL — ABNORMAL HIGH (ref 65–99)
POTASSIUM: 3.1 mmol/L — AB (ref 3.5–5.1)
SODIUM: 138 mmol/L (ref 135–145)

## 2018-02-28 LAB — CBC WITH DIFFERENTIAL/PLATELET
Basophils Absolute: 0 10*3/uL (ref 0.0–0.1)
Basophils Relative: 0 %
Eosinophils Absolute: 0.1 10*3/uL (ref 0.0–0.7)
Eosinophils Relative: 2 %
HEMATOCRIT: 36.3 % (ref 36.0–46.0)
HEMOGLOBIN: 11.2 g/dL — AB (ref 12.0–15.0)
LYMPHS ABS: 1.5 10*3/uL (ref 0.7–4.0)
Lymphocytes Relative: 19 %
MCH: 29 pg (ref 26.0–34.0)
MCHC: 30.9 g/dL (ref 30.0–36.0)
MCV: 94 fL (ref 78.0–100.0)
MONOS PCT: 7 %
Monocytes Absolute: 0.6 10*3/uL (ref 0.1–1.0)
NEUTROS PCT: 72 %
Neutro Abs: 5.6 10*3/uL (ref 1.7–7.7)
Platelets: 176 10*3/uL (ref 150–400)
RBC: 3.86 MIL/uL — AB (ref 3.87–5.11)
RDW: 14 % (ref 11.5–15.5)
WBC: 7.8 10*3/uL (ref 4.0–10.5)

## 2018-02-28 MED ORDER — POTASSIUM CHLORIDE CRYS ER 20 MEQ PO TBCR: 20.0000 meq | EXTENDED_RELEASE_TABLET | Freq: Two times a day (BID) | ORAL | 0 refills | Status: DC

## 2018-02-28 MED ORDER — DEXAMETHASONE 4 MG PO TABS
10.0000 mg | ORAL_TABLET | Freq: Once | ORAL | Status: AC
  Administered 2018-02-28: 10 mg via ORAL
  Filled 2018-02-28: qty 3

## 2018-02-28 MED ORDER — SODIUM CHLORIDE 0.9 % IV BOLUS
1000.0000 mL | Freq: Once | INTRAVENOUS | Status: AC
  Administered 2018-02-28: 1000 mL via INTRAVENOUS

## 2018-02-28 MED ORDER — IPRATROPIUM-ALBUTEROL 0.5-2.5 (3) MG/3ML IN SOLN
3.0000 mL | Freq: Once | RESPIRATORY_TRACT | Status: AC
  Administered 2018-02-28: 3 mL via RESPIRATORY_TRACT
  Filled 2018-02-28: qty 3

## 2018-02-28 MED ORDER — POTASSIUM CHLORIDE CRYS ER 20 MEQ PO TBCR
40.0000 meq | EXTENDED_RELEASE_TABLET | Freq: Once | ORAL | Status: AC
  Administered 2018-02-28: 40 meq via ORAL
  Filled 2018-02-28: qty 2

## 2018-02-28 NOTE — ED Provider Notes (Signed)
[facility] EMERGENCY DEPARTMENT Provider Note   CSN: [id number] Arrival date & time: 02/28/18  0120     History   Chief Complaint Chief Complaint  Patient presents with  . Shortness of Breath    HPI Mary Parker is a 37 y.o. female.  The history is provided by the patient.  Shortness of Breath   She has history of fibromyalgia, irritable bowel syndrome and comes into the emergency department with ongoing difficulty with sore throat, cough, shortness of breath.  She has been sick for about the last 8 days and had been seen by her PCP who prescribed amoxicillin-clavulanic acid and prednisone as well as albuterol inhaler.  She got worse and was seen in the emergency department.  She had negative strep screen in her PCPs office and negative neck and chest x-rays in the ED.  She was given a prescription for benzonatate which does not seem to help.  She saw her PCP again today and was switched to levofloxacin and given a steroid inhaler.  Tonight, she notes that whenever she lays down, she feels like she is smothering.  She continues to have a nonproductive cough.  She has not had fever but has had chills and sweats.  Her chest is sore when she coughs, but there is no other pain.  She is a cigarette smoker, but has not been smoking while she has been sick.  Past Medical History:  Diagnosis Date  . Anxiety   . Arthritis    both knees  . Body aches 12/11/2014  . Chronic insomnia   . Chronic low back pain 05/29/2015  . Common migraine 12/19/2014  . Depression 12/11/2014  . Fibrocystic disease of both breasts   . Fibromyalgia 12/19/2014  . IBS (irritable bowel syndrome)   . Migraines   . Rebound headache 12/19/2014  . Right ovarian cyst 04/25/2017   Complex, check CA 125 and repeat US in 6 weeks   . Sciatica of left side   . Seizures (HCC)    remote past, felt it was related to Chantix   . Vitamin D deficiency     Patient Active Problem List   Diagnosis Date Noted  . Chronic left SI  joint pain 07/07/2017  . S/P abdominal supracervical subtotal hysterectomy 06/21/2017  . Pelvic adhesions 06/09/2017  . History of ovarian cyst 06/09/2017  . Right ovarian cyst 04/25/2017  . Encounter for gynecological examination with Papanicolaou smear of cervix 04/19/2017  . Rectal bleeding 04/02/2017  . LLQ pain 04/01/2017  . Constipation 04/01/2017  . Concussion with loss of consciousness 11/16/2016  . Anxiety 10/14/2016  . Chronic low back pain 05/29/2015  . Common migraine 12/19/2014  . Fibromyalgia 12/19/2014  . Rebound headache 12/19/2014  . Depression 12/11/2014  . Body aches 12/11/2014    Past Surgical History:  Procedure Laterality Date  . BILATERAL SALPINGECTOMY Bilateral 06/21/2017   Procedure: BILATERAL SALPINGECTOMY;  Surgeon: Tilda Burrow, MD;  Location: [facility];  Service: Gynecology;  Laterality: Bilateral;  . CESAREAN SECTION  [phone]  . CHOLECYSTECTOMY  2001  . COLONOSCOPY WITH PROPOFOL N/A 04/07/2017   Procedure: COLONOSCOPY WITH PROPOFOL;  Surgeon: Corbin Ade, MD;  Location: [facility];  Service: Endoscopy;  Laterality: N/A;  2:15pm  . ENDOMETRIAL ABLATION    . SUPRACERVICAL ABDOMINAL HYSTERECTOMY N/A 06/21/2017   Procedure: HYSTERECTOMY SUPRACERVICAL ABDOMINAL;  Surgeon: Tilda Burrow, MD;  Location: [facility];  Service: Gynecology;  Laterality: N/A;  . TUBAL LIGATION  OB History    Gravida  3   Para  3   Term  3   Preterm      AB      Living  3     SAB      TAB      Ectopic      Multiple      Live Births  3            Home Medications    Prior to Admission medications   Medication Sig Start Date End Date Taking? Authorizing Provider  albuterol (PROVENTIL) (2.5 MG/3ML) 0.083% nebulizer solution VVN Q 6 H PRN 02/21/18   [provider]  ALPRAZolam Prudy Feeler) 0.5 MG tablet Take 1 tablet (0.5 mg total) by mouth 2 (two) times daily as needed for anxiety. 10/14/16   Adline Potter, NP    amoxicillin-clavulanate (AUGMENTIN) 875-125 MG tablet TK 1 T PO Q 12 H 02/22/18   [provider]  benzonatate (TESSALON) 100 MG capsule Take 1 capsule (100 mg total) by mouth 3 (three) times daily as needed for cough. 02/23/18   Loren Racer, MD  Butalbital-APAP-Caffeine 607 231 2301 MG capsule TK 1 C PO Q 6 H PRF 30 DAYS 02/21/18   [provider]  cetirizine HCl (ZYRTEC) 1 MG/ML solution Take 10 mg by mouth at bedtime.    [provider]  cyanocobalamin (,VITAMIN B-12,) 1000 MCG/ML injection INJECT 1 ML INTO THE SKIN ONCE A MONTH. 02/17/18   [provider]  escitalopram (LEXAPRO) 20 MG tablet Take 20 mg by mouth daily. 10/31/17   [provider]  levocetirizine (XYZAL) 5 MG tablet TK 1 T PO QHS 02/01/18   [provider]  ondansetron (ZOFRAN ODT) 4 MG disintegrating tablet Take 1 tablet (4 mg total) by mouth every 8 (eight) hours as needed for nausea or vomiting. 08/24/17   York Spaniel, MD  predniSONE (DELTASONE) 10 MG tablet  02/22/18   [provider]  rizatriptan (MAXALT-MLT) 10 MG disintegrating tablet DISSOLVE 1 TABLET(10 MG) ON THE TONGUE THREE TIMES DAILY AS NEEDED FOR MIGRAINE 02/15/18   York Spaniel, MD  tapentadol (NUCYNTA) 50 MG tablet Take 1 tablet (50 mg total) by mouth every 6 (six) hours as needed. Must last 28 days 01/30/18   York Spaniel, MD  tiZANidine (ZANAFLEX) 2 MG tablet TAKE 1 TABLET BY MOUTH TWICE DAILY, THEN TAKE 2 TABLETS EVERY EVENING Patient taking differently: TAKE 1 TABLET BY MOUTH TWICE DAILY, THEN TAKE 2 TABLETS EVERY EVENING as needed 08/18/17   York Spaniel, MD  traZODone (DESYREL) 50 MG tablet Take 50 mg by mouth at bedtime.    [provider]  varenicline (CHANTIX) 1 MG tablet Take 1 mg by mouth daily.     [provider]  Vitamin D, Ergocalciferol, (DRISDOL) 50000 units CAPS capsule TK 1 C PO 1 TIME A WK FOR 12 WKS 02/18/18   [provider]    Family  History Family History  Problem Relation Age of Onset  . Hypertension Mother   . Other Mother        abnormal cells; had hyst  . Obesity Daughter   . Diabetes Daughter 36       youngest  . Obesity Son   . Cancer Maternal Grandmother   . Osteoporosis Maternal Grandmother   . Cancer Maternal Grandfather   . Obesity Daughter   . Diabetes Father   . Hypertension Father   . Alcohol abuse Father   .  Colon cancer Neg Hx     Social History Social History   Tobacco Use  . Smoking status: Former Smoker    Packs/day: 0.50    Years: 23.00    Pack years: 11.50    Types: Cigarettes  . Smokeless tobacco: Never Used  Substance Use Topics  . Alcohol use: No  . Drug use: No     Allergies   Wellbutrin [bupropion]; Ciprofloxacin; Cymbalta [duloxetine hcl]; Meloxicam; Savella [milnacipran]; Topamax [topiramate]; Pristiq [desvenlafaxine succinate er]; Doxycycline; Gabapentin; Lyrica [pregabalin]; Nortriptyline; and Percocet [oxycodone-acetaminophen]   Review of Systems Review of Systems  Respiratory: Positive for shortness of breath.   All other systems reviewed and are negative.    Physical Exam Updated Vital Signs BP 92/63   Pulse 64   Temp 97.6 F (36.4 C) (Oral)   Resp 11   Ht  (1.6 m)   Wt 92.5 kg (204 lb)   SpO2 95%   BMI 36.14 kg/m   Physical Exam  Nursing note and vitals reviewed.  37 year old female, resting comfortably and in no acute distress. Vital signs are normal. Oxygen saturation is 95%, which is normal. Head is normocephalic and atraumatic. PERRLA, EOMI. Oropharynx is mildly erythematous without exudate.  There is no pooling of secretions.  She is only able to speak in whispers. Neck is nontender and supple without adenopathy or JVD. Back is nontender and there is no CVA tenderness. Lungs are clear without rales, wheezes, or rhonchi. Chest is nontender. Heart has regular rate and rhythm without murmur. Abdomen is soft, flat, nontender without  masses or hepatosplenomegaly and peristalsis is normoactive. Extremities have no cyanosis or edema, full range of motion is present. Skin is warm and dry without rash. Neurologic: Mental status is normal, cranial nerves are intact, there are no motor or sensory deficits.  ED Treatments / Results  Labs (all labs ordered are listed, but only abnormal results are displayed) Labs Reviewed  BASIC METABOLIC PANEL  CBC WITH DIFFERENTIAL/PLATELET    Radiology Dg Chest 2 View  Result Date: 02/28/2018 CLINICAL DATA:  Shortness of breath for a week. Patient has been seen multiple times but unable to find out what is wrong. Worsening shortness of breath tonight with left upper chest pain. Coughing all week. EXAM: CHEST - 2 VIEW COMPARISON:  02/23/2018 FINDINGS: The heart size and mediastinal contours are within normal limits. Both lungs are clear. The visualized skeletal structures are unremarkable. IMPRESSION: No active cardiopulmonary disease. Electronically Signed   By: Burman Nieves M.D.   On: 02/28/2018 02:06    Procedures Procedures  Medications Ordered in ED Medications  sodium chloride 0.9 % bolus 1,000 mL (has no administration in time range)  dexamethasone (DECADRON) tablet 10 mg (has no administration in time range)  ipratropium-albuterol (DUONEB) 0.5-2.5 (3) MG/3ML nebulizer solution 3 mL (has no administration in time range)   Initial Impression / Assessment and Plan / ED Course  I have reviewed the triage vital signs and the nursing notes.  Pertinent labs & imaging results that were available during my care of the patient were reviewed by me and considered in my medical decision making (see chart for details).  Upper respiratory infection with laryngitis and subjective dyspnea.  No evidence of actual respiratory distress.  Oxygen saturation is normal.  Chest x-ray obtained in the ED shows no evidence of pneumonia.  Old records are reviewed confirming recent ED visit with  negative chest x-ray and negative soft tissue neck x-ray.  Although exam  is benign, will give trial of albuterol with ipratropium via nebulizer.  She will be given IV fluids and dexamethasone.  I feel that her underlying problem is a viral illness and I doubt that switching to a fluoroquinolone will be helpful.  Since she has failed maximum treatment, will refer to ENT.  Relief with above-noted treatment.  Labs do show hypokalemia and she is given a dose of K-Dur and is discharged with prescription for same.  Refer to ENT, return precautions discussed.  Final Clinical Impressions(s) / ED Diagnoses   Final diagnoses:  Shortness of breath  Laryngitis  Hypokalemia    ED Discharge Orders        Ordered    potassium chloride SA (K-DUR,KLOR-CON) 20 MEQ tablet  2 times daily     02/28/18 0609       Dione Booze, MD 02/28/18 [phone]

## 2018-02-28 NOTE — Discharge Instructions (Addendum)
Continue your current treatment.  Follow up with the ENT physician.  Return if symptoms are getting worse.

## 2018-02-28 NOTE — ED Triage Notes (Signed)
Pt c/o sob x one week with sore throat

## 2018-03-01 ENCOUNTER — Ambulatory Visit: Payer: Self-pay | Admitting: Neurology

## 2018-03-01 ENCOUNTER — Encounter: Payer: Self-pay | Admitting: Neurology

## 2018-03-02 NOTE — Telephone Encounter (Signed)
Spoke w/ Angie in our billing department and she spoke w/ patient. She was unable to make a payment toward balance. Pt stated should would call back.

## 2018-03-06 ENCOUNTER — Encounter: Payer: Self-pay | Admitting: Neurology

## 2018-03-07 ENCOUNTER — Other Ambulatory Visit: Payer: Self-pay | Admitting: Neurology

## 2018-03-07 MED ORDER — TAPENTADOL HCL 50 MG PO TABS: 50.0000 mg | ORAL_TABLET | Freq: Four times a day (QID) | ORAL | 0 refills | Status: DC | PRN

## 2018-03-07 NOTE — Progress Notes (Signed)
A prescription for Nucynta was sent in.

## 2018-03-09 ENCOUNTER — Other Ambulatory Visit: Payer: Self-pay | Admitting: Neurology

## 2018-03-15 ENCOUNTER — Encounter: Payer: Self-pay | Admitting: Neurology

## 2018-03-15 NOTE — Telephone Encounter (Signed)
Called pt. Offered tomorrow morning at 8am w/ Dr Anne Hahn instead. He had a cancellation. She was agreeable. I cx appt on 03/22/18 previously made and schedule her for tomorrow at 8am, check in 745am at the latest.   I spoke w/ Angie who stated pt made 20 dollar payment towards balance.

## 2018-03-16 ENCOUNTER — Encounter: Payer: Self-pay | Admitting: Neurology

## 2018-03-16 ENCOUNTER — Ambulatory Visit (INDEPENDENT_AMBULATORY_CARE_PROVIDER_SITE_OTHER): Payer: Medicaid Other | Admitting: Neurology

## 2018-03-16 VITALS — BP 118/70 | HR 93 | Ht 63.0 in | Wt 211.0 lb

## 2018-03-16 DIAGNOSIS — G8929 Other chronic pain: Secondary | ICD-10-CM | POA: Diagnosis not present

## 2018-03-16 DIAGNOSIS — G43019 Migraine without aura, intractable, without status migrainosus: Secondary | ICD-10-CM | POA: Diagnosis not present

## 2018-03-16 DIAGNOSIS — M533 Sacrococcygeal disorders, not elsewhere classified: Secondary | ICD-10-CM

## 2018-03-16 DIAGNOSIS — M797 Fibromyalgia: Secondary | ICD-10-CM

## 2018-03-16 MED ORDER — PROPRANOLOL HCL 20 MG PO TABS: 20.0000 mg | ORAL_TABLET | Freq: Two times a day (BID) | ORAL | 3 refills | Status: DC

## 2018-03-16 NOTE — Progress Notes (Signed)
Reason for visit: Migraine headache  Mary Parker is an 37 y.o. female  History of present illness:  Mary Parker is a 37 year old right-handed white female with a history of migraine headaches.  The patient is having headaches on average twice a week, the Maxalt does work well for her.  She is not on any daily preventative medications, she has failed multiple medications in the past including Lyrica, Cymbalta, Topamax, nortriptyline, and gabapentin.  The patient also complains of SI joint dysfunction problems with pain now primarily on the left side with pain going down the leg, she is now noticing that the pain is projecting forward in the left groin area.  She has chronic left-sided numbness.  The patient comes in today for reevaluation.  Past Medical History:  Diagnosis Date  . Anxiety   . Arthritis    both knees  . Body aches 12/11/2014  . Chronic insomnia   . Chronic low back pain 05/29/2015  . Common migraine 12/19/2014  . Depression 12/11/2014  . Fibrocystic disease of both breasts   . Fibromyalgia 12/19/2014  . IBS (irritable bowel syndrome)   . Migraines   . Rebound headache 12/19/2014  . Right ovarian cyst 04/25/2017   Complex, check CA 125 and repeat US in 6 weeks   . Sciatica of left side   . Seizures (HCC)    remote past, felt it was related to Chantix   . Vitamin D deficiency     Past Surgical History:  Procedure Laterality Date  . BILATERAL SALPINGECTOMY Bilateral 06/21/2017   Procedure: BILATERAL SALPINGECTOMY;  Surgeon: Tilda Burrow, MD;  Location: [facility];  Service: Gynecology;  Laterality: Bilateral;  . CESAREAN SECTION  [phone]  . CHOLECYSTECTOMY  2001  . COLONOSCOPY WITH PROPOFOL N/A 04/07/2017   Procedure: COLONOSCOPY WITH PROPOFOL;  Surgeon: Corbin Ade, MD;  Location: [facility];  Service: Endoscopy;  Laterality: N/A;  2:15pm  . ENDOMETRIAL ABLATION    . SUPRACERVICAL ABDOMINAL HYSTERECTOMY N/A 06/21/2017   Procedure: HYSTERECTOMY  SUPRACERVICAL ABDOMINAL;  Surgeon: Tilda Burrow, MD;  Location: [facility];  Service: Gynecology;  Laterality: N/A;  . TUBAL LIGATION      Family History  Problem Relation Age of Onset  . Hypertension Mother   . Other Mother        abnormal cells; had hyst  . Obesity Daughter   . Diabetes Daughter 22       youngest  . Obesity Son   . Cancer Maternal Grandmother   . Osteoporosis Maternal Grandmother   . Cancer Maternal Grandfather   . Obesity Daughter   . Diabetes Father   . Hypertension Father   . Alcohol abuse Father   . Colon cancer Neg Hx     Social history:  reports that she has quit smoking. Her smoking use included cigarettes. She has a 11.50 pack-year smoking history. She has never used smokeless tobacco. She reports that she does not drink alcohol or use drugs.    Allergies  Allergen Reactions  . Wellbutrin [Bupropion] Other (See Comments)    Suicidal ideation  . Ciprofloxacin Hives, Swelling and Other (See Comments)    Sweating, dizziness  . Cymbalta [Duloxetine Hcl] Hives, Itching and Rash  . Meloxicam Other (See Comments)    Abdominal cramping and mood changes   . Savella [Milnacipran] Other (See Comments)    Dizziness and nausea, leg cramps  . Topamax [Topiramate] Swelling and Other (See Comments)    Dizziness and  slurred speech  . Pristiq [Desvenlafaxine Succinate Er]     UTI  . Doxycycline Nausea Only  . Gabapentin Itching  . Lyrica [Pregabalin] Other (See Comments)    Tremors   . Nortriptyline Other (See Comments)    GERD  . Percocet [Oxycodone-Acetaminophen] Nausea Only    Medications:  Prior to Admission medications   Medication Sig Start Date End Date Taking? Authorizing Provider  albuterol (PROVENTIL) (2.5 MG/3ML) 0.083% nebulizer solution VVN Q 6 H PRN 02/21/18  Yes [provider]  ALPRAZolam (XANAX) 0.5 MG tablet Take 1 tablet (0.5 mg total) by mouth 2 (two) times daily as needed for anxiety. 10/14/16  Yes Cyril Mourning A, NP    Butalbital-APAP-Caffeine 50-325-40 MG capsule TK 1 C PO Q 6 H PRF 30 DAYS 02/21/18  Yes [provider]  cetirizine HCl (ZYRTEC) 1 MG/ML solution Take 10 mg by mouth at bedtime.   Yes [provider]  cyanocobalamin (,VITAMIN B-12,) 1000 MCG/ML injection INJECT 1 ML INTO THE SKIN ONCE A MONTH. 02/17/18  Yes [provider]  escitalopram (LEXAPRO) 20 MG tablet Take 20 mg by mouth daily. 10/31/17  Yes [provider]  levocetirizine (XYZAL) 5 MG tablet TK 1 T PO QHS 02/01/18  Yes [provider]  ondansetron (ZOFRAN ODT) 4 MG disintegrating tablet Take 1 tablet (4 mg total) by mouth every 8 (eight) hours as needed for nausea or vomiting. 08/24/17  Yes York Spaniel, MD  rizatriptan (MAXALT-MLT) 10 MG disintegrating tablet DISSOLVE 1 TABLET ON THE TONGUE THREE TIMES DAILY AS NEEDED FOR MIGRAINE 03/09/18  Yes York Spaniel, MD  tapentadol (NUCYNTA) 50 MG tablet Take 1 tablet (50 mg total) by mouth every 6 (six) hours as needed. Must last 28 days 03/07/18  Yes York Spaniel, MD  tiZANidine (ZANAFLEX) 2 MG tablet TAKE 1 TABLET BY MOUTH TWICE DAILY, THEN TAKE 2 TABLETS EVERY EVENING Patient taking differently: TAKE 1 TABLET BY MOUTH TWICE DAILY, THEN TAKE 2 TABLETS EVERY EVENING as needed 08/18/17  Yes York Spaniel, MD  traZODone (DESYREL) 50 MG tablet Take 50 mg by mouth at bedtime.   Yes [provider]  varenicline (CHANTIX) 1 MG tablet Take 1 mg by mouth daily.    Yes [provider]  Vitamin D, Ergocalciferol, (DRISDOL) 50000 units CAPS capsule TK 1 C PO 1 TIME A WK FOR 12 WKS 02/18/18  Yes [provider]  propranolol (INDERAL) 20 MG tablet Take 1 tablet (20 mg total) by mouth 2 (two) times daily. 03/16/18   York Spaniel, MD    ROS:  Out of a complete 14 system review of symptoms, the patient complains only of the following symptoms, and all other reviewed systems are negative.  Ear pain, runny nose, difficulty  swallowing Cough, wheezing, chest tightness Abdominal pain Joint pain, back pain, neck pain bruising easily Headache Depression, anxiety  Blood pressure 118/70, pulse 93, height  (1.6 m), weight 211 lb (95.7 kg), SpO2 98 %.  Physical Exam  General: The patient is alert and cooperative at the time of the examination.  The patient is moderately to markedly obese.  Neuromuscular: Range move the low back is full.  No significant discomfort with internal or external rotation of the hips is noted.  Skin: No significant peripheral edema is noted.   Neurologic Exam  Mental status: The patient is alert and oriented x 3 at the time of the examination. The patient has apparent normal recent and remote  memory, with an apparently normal attention span and concentration ability.   Cranial nerves: Facial symmetry is present. Speech is normal, no aphasia or dysarthria is noted. Extraocular movements are full. Visual fields are full.  Motor: The patient has good strength in all 4 extremities.  Sensory examination: Soft touch sensation is symmetric on the face, arms, and legs.  Coordination: The patient has good finger-nose-finger and heel-to-shin bilaterally.  Gait and station: The patient has a normal gait. Tandem gait is normal. Romberg is negative. No drift is seen.  Reflexes: Deep tendon reflexes are symmetric.   Assessment/Plan:  1.  Migraine headache  2.  SI joint dysfunction, left greater than right   The patient will be placed on propranolol 20 mg twice daily for the migraine headache.  I will make a referral to a pain center for evaluation of the ongoing SI joint pain, patient could potentially benefit from radiofrequency ablation therapy.  She will follow-up otherwise in 6 months.  Marlan Palau MD 03/16/2018 8:24 AM  [facility] [address] [place], [place] [address]  Phone [phone] Fax [phone]

## 2018-03-16 NOTE — Patient Instructions (Signed)
We will start propranolol for the headache.     Inderal (propranolol) is a blood pressure medication that is commonly used for migraine headaches. This is a type of beta blocker. The most common side effects include low heart rate, dizziness, fatigue, and increased depression. This medication may worsen asthma. If you believe that you are having side effects on this medication, please contact our office.  

## 2018-03-16 NOTE — Procedures (Signed)
   Mary Parker is a 37 year old patient with a history of migraine headaches and bilateral SI joint dysfunction.  The patient has left low back pain and right posterior thigh pain.  The patient comes in today for trigger point injections of these areas.  0.5% bupivacaine injections were done.  5 injections were done around the left SI joint of 1 cc each.  This was done in a 6 cm area around the left SI joint.  The patient tolerated the procedure well, no complications of the procedure were noted.  0.5% bupivacaine was used, 250 mg per 50 cc.   NDC 60454-098-11 Batch: [id number] Expiration: March 2020

## 2018-03-20 ENCOUNTER — Telehealth: Payer: Self-pay | Admitting: Neurology

## 2018-03-20 NOTE — Telephone Encounter (Signed)
Patient is scheduled 03/24/2018 at [facility] with Dr. Marca Ancona .

## 2018-03-22 ENCOUNTER — Ambulatory Visit: Payer: Self-pay | Admitting: Neurology

## 2018-04-03 ENCOUNTER — Ambulatory Visit (INDEPENDENT_AMBULATORY_CARE_PROVIDER_SITE_OTHER): Payer: Medicaid Other | Admitting: Otolaryngology

## 2018-04-03 DIAGNOSIS — K219 Gastro-esophageal reflux disease without esophagitis: Secondary | ICD-10-CM

## 2018-04-03 DIAGNOSIS — R490 Dysphonia: Secondary | ICD-10-CM | POA: Diagnosis not present

## 2018-04-03 DIAGNOSIS — H6122 Impacted cerumen, left ear: Secondary | ICD-10-CM

## 2018-04-06 ENCOUNTER — Encounter: Payer: Self-pay | Admitting: Neurology

## 2018-04-06 ENCOUNTER — Other Ambulatory Visit: Payer: Self-pay | Admitting: *Deleted

## 2018-04-06 MED ORDER — TAPENTADOL HCL 50 MG PO TABS: 50.0000 mg | ORAL_TABLET | Freq: Four times a day (QID) | ORAL | 0 refills | Status: DC | PRN

## 2018-04-06 NOTE — Progress Notes (Signed)
A prescription for Nucynta was sent in.

## 2018-04-07 ENCOUNTER — Encounter (HOSPITAL_BASED_OUTPATIENT_CLINIC_OR_DEPARTMENT_OTHER): Payer: Self-pay | Admitting: *Deleted

## 2018-04-07 ENCOUNTER — Other Ambulatory Visit: Payer: Self-pay

## 2018-04-07 NOTE — Progress Notes (Signed)
Pt instructed npo pmn 6/17 x omeprazole, pain med w sip of water.  Pt to bring inhaler and 1 dose of maxalt w her.  [facility].

## 2018-04-18 ENCOUNTER — Encounter (HOSPITAL_BASED_OUTPATIENT_CLINIC_OR_DEPARTMENT_OTHER)
Admission: RE | Disposition: A | Discharge status: Home / Self Care | Payer: Self-pay | Source: Ambulatory Visit | Attending: Orthopedic Surgery

## 2018-04-18 ENCOUNTER — Ambulatory Visit (HOSPITAL_BASED_OUTPATIENT_CLINIC_OR_DEPARTMENT_OTHER): Payer: Medicaid Other | Admitting: Anesthesiology

## 2018-04-18 ENCOUNTER — Ambulatory Visit (HOSPITAL_BASED_OUTPATIENT_CLINIC_OR_DEPARTMENT_OTHER)
Admission: RE | Admit: 2018-04-18 | Discharge: 2018-04-18 | Disposition: A | Discharge status: Home / Self Care | Payer: Medicaid Other | Source: Ambulatory Visit | Attending: Orthopedic Surgery | Admitting: Orthopedic Surgery

## 2018-04-18 ENCOUNTER — Encounter (HOSPITAL_BASED_OUTPATIENT_CLINIC_OR_DEPARTMENT_OTHER): Payer: Self-pay

## 2018-04-18 DIAGNOSIS — Z881 Allergy status to other antibiotic agents status: Secondary | ICD-10-CM | POA: Insufficient documentation

## 2018-04-18 DIAGNOSIS — M94261 Chondromalacia, right knee: Secondary | ICD-10-CM | POA: Diagnosis present

## 2018-04-18 DIAGNOSIS — Z885 Allergy status to narcotic agent status: Secondary | ICD-10-CM | POA: Diagnosis not present

## 2018-04-18 DIAGNOSIS — M170 Bilateral primary osteoarthritis of knee: Secondary | ICD-10-CM | POA: Diagnosis not present

## 2018-04-18 DIAGNOSIS — M65861 Other synovitis and tenosynovitis, right lower leg: Secondary | ICD-10-CM | POA: Diagnosis not present

## 2018-04-18 DIAGNOSIS — F419 Anxiety disorder, unspecified: Secondary | ICD-10-CM | POA: Insufficient documentation

## 2018-04-18 DIAGNOSIS — K219 Gastro-esophageal reflux disease without esophagitis: Secondary | ICD-10-CM | POA: Diagnosis not present

## 2018-04-18 DIAGNOSIS — F329 Major depressive disorder, single episode, unspecified: Secondary | ICD-10-CM | POA: Insufficient documentation

## 2018-04-18 DIAGNOSIS — M222X1 Patellofemoral disorders, right knee: Secondary | ICD-10-CM | POA: Insufficient documentation

## 2018-04-18 DIAGNOSIS — Z888 Allergy status to other drugs, medicaments and biological substances status: Secondary | ICD-10-CM | POA: Diagnosis not present

## 2018-04-18 DIAGNOSIS — Z87891 Personal history of nicotine dependence: Secondary | ICD-10-CM | POA: Insufficient documentation

## 2018-04-18 DIAGNOSIS — E559 Vitamin D deficiency, unspecified: Secondary | ICD-10-CM | POA: Insufficient documentation

## 2018-04-18 DIAGNOSIS — K589 Irritable bowel syndrome without diarrhea: Secondary | ICD-10-CM | POA: Insufficient documentation

## 2018-04-18 DIAGNOSIS — Z79899 Other long term (current) drug therapy: Secondary | ICD-10-CM | POA: Insufficient documentation

## 2018-04-18 DIAGNOSIS — M797 Fibromyalgia: Secondary | ICD-10-CM | POA: Diagnosis not present

## 2018-04-18 HISTORY — DX: Presence of spectacles and contact lenses: Z97.3

## 2018-04-18 HISTORY — PX: KNEE ARTHROSCOPY WITH LATERAL RELEASE: SHX5649

## 2018-04-18 HISTORY — DX: Gastro-esophageal reflux disease without esophagitis: K21.9

## 2018-04-18 HISTORY — DX: Personal history of other diseases of the digestive system: Z87.19

## 2018-04-18 HISTORY — DX: Pain in right knee: M25.561

## 2018-04-18 SURGERY — ARTHROSCOPY, KNEE, WITH LATERAL RETINACULUM RELEASE
Anesthesia: General | Site: Knee | Laterality: Right

## 2018-04-18 MED ORDER — HYDROMORPHONE HCL 2 MG PO TABS: 2.0000 mg | ORAL_TABLET | Freq: Four times a day (QID) | ORAL | 0 refills | Status: DC | PRN

## 2018-04-18 MED ORDER — SODIUM CHLORIDE 0.9 % IR SOLN
Status: DC | PRN
  Administered 2018-04-18: 3000 mL

## 2018-04-18 MED ORDER — MIDAZOLAM HCL 2 MG/2ML IJ SOLN
INTRAMUSCULAR | Status: AC
  Filled 2018-04-18: qty 2

## 2018-04-18 MED ORDER — HYDROMORPHONE HCL 2 MG PO TABS
2.0000 mg | ORAL_TABLET | Freq: Four times a day (QID) | ORAL | Status: DC | PRN
  Administered 2018-04-18: 2 mg via ORAL
  Filled 2018-04-18: qty 1

## 2018-04-18 MED ORDER — HYDROMORPHONE HCL 2 MG PO TABS
ORAL_TABLET | ORAL | Status: AC
  Filled 2018-04-18: qty 1

## 2018-04-18 MED ORDER — FENTANYL CITRATE (PF) 100 MCG/2ML IJ SOLN
INTRAMUSCULAR | Status: AC
  Filled 2018-04-18: qty 2

## 2018-04-18 MED ORDER — CHLORHEXIDINE GLUCONATE 4 % EX LIQD
60.0000 mL | Freq: Once | CUTANEOUS | Status: DC
  Filled 2018-04-18: qty 118

## 2018-04-18 MED ORDER — LACTATED RINGERS IV SOLN
INTRAVENOUS | Status: DC
  Administered 2018-04-18: 1000 mL via INTRAVENOUS
  Filled 2018-04-18: qty 1000

## 2018-04-18 MED ORDER — KETOROLAC TROMETHAMINE 30 MG/ML IJ SOLN
INTRAMUSCULAR | Status: AC
  Filled 2018-04-18: qty 1

## 2018-04-18 MED ORDER — DEXAMETHASONE SODIUM PHOSPHATE 10 MG/ML IJ SOLN
INTRAMUSCULAR | Status: AC
  Filled 2018-04-18: qty 1

## 2018-04-18 MED ORDER — BUPIVACAINE HCL 0.25 % IJ SOLN
INTRAMUSCULAR | Status: DC | PRN
  Administered 2018-04-18: 20 mL

## 2018-04-18 MED ORDER — LIDOCAINE HCL (CARDIAC) PF 100 MG/5ML IV SOSY
PREFILLED_SYRINGE | INTRAVENOUS | Status: DC | PRN
  Administered 2018-04-18: 100 mg via INTRAVENOUS

## 2018-04-18 MED ORDER — ONDANSETRON HCL 4 MG/2ML IJ SOLN
INTRAMUSCULAR | Status: AC
  Filled 2018-04-18: qty 2

## 2018-04-18 MED ORDER — PROPOFOL 10 MG/ML IV BOLUS
INTRAVENOUS | Status: AC
  Filled 2018-04-18: qty 20

## 2018-04-18 MED ORDER — CEFAZOLIN SODIUM-DEXTROSE 2-4 GM/100ML-% IV SOLN
INTRAVENOUS | Status: AC
  Filled 2018-04-18: qty 100

## 2018-04-18 MED ORDER — PROPOFOL 10 MG/ML IV BOLUS
INTRAVENOUS | Status: DC | PRN
  Administered 2018-04-18: 20 mg via INTRAVENOUS
  Administered 2018-04-18: 180 mg via INTRAVENOUS

## 2018-04-18 MED ORDER — DEXAMETHASONE SODIUM PHOSPHATE 4 MG/ML IJ SOLN
INTRAMUSCULAR | Status: DC | PRN
  Administered 2018-04-18: 10 mg via INTRAVENOUS

## 2018-04-18 MED ORDER — MIDAZOLAM HCL 5 MG/5ML IJ SOLN
INTRAMUSCULAR | Status: DC | PRN
  Administered 2018-04-18: 2 mg via INTRAVENOUS

## 2018-04-18 MED ORDER — LIDOCAINE 2% (20 MG/ML) 5 ML SYRINGE
INTRAMUSCULAR | Status: AC
  Filled 2018-04-18: qty 5

## 2018-04-18 MED ORDER — CEFAZOLIN SODIUM-DEXTROSE 2-4 GM/100ML-% IV SOLN
2.0000 g | INTRAVENOUS | Status: AC
  Administered 2018-04-18: 2 g via INTRAVENOUS
  Filled 2018-04-18: qty 100

## 2018-04-18 MED ORDER — ONDANSETRON HCL 4 MG/2ML IJ SOLN
INTRAMUSCULAR | Status: DC | PRN
  Administered 2018-04-18: 4 mg via INTRAVENOUS

## 2018-04-18 MED ORDER — FENTANYL CITRATE (PF) 100 MCG/2ML IJ SOLN
25.0000 ug | INTRAMUSCULAR | Status: DC | PRN
  Filled 2018-04-18: qty 1

## 2018-04-18 MED ORDER — FENTANYL CITRATE (PF) 100 MCG/2ML IJ SOLN
INTRAMUSCULAR | Status: DC | PRN
  Administered 2018-04-18: 50 ug via INTRAVENOUS
  Administered 2018-04-18 (×4): 25 ug via INTRAVENOUS

## 2018-04-18 SURGICAL SUPPLY — 46 items
BANDAGE ACE 6X5 VEL STRL LF (GAUZE/BANDAGES/DRESSINGS) ×2 IMPLANT
BANDAGE ELASTIC 6 VELCRO ST LF (GAUZE/BANDAGES/DRESSINGS) ×3 IMPLANT
BANDAGE ESMARK 6X9 LF (GAUZE/BANDAGES/DRESSINGS) IMPLANT
BLADE CUDA GRT WHITE 3.5 (BLADE) IMPLANT
BLADE CUTTER GATOR 3.5 (BLADE) IMPLANT
BLADE GREAT WHITE 4.2 (BLADE) IMPLANT
BLADE GREAT WHITE 4.2MM (BLADE)
BNDG CMPR 9X6 STRL LF SNTH (GAUZE/BANDAGES/DRESSINGS)
BNDG ESMARK 6X9 LF (GAUZE/BANDAGES/DRESSINGS)
CUFF TOURNIQUET SINGLE 34IN LL (TOURNIQUET CUFF) ×3 IMPLANT
DRAPE ARTHROSCOPY W/POUCH 114 (DRAPES) ×3 IMPLANT
DRAPE C-ARM 42X120 X-RAY (DRAPES) IMPLANT
DRAPE SHEET LG 3/4 BI-LAMINATE (DRAPES) IMPLANT
DRAPE U-SHAPE 47X51 STRL (DRAPES) ×3 IMPLANT
DRSG PAD ABDOMINAL 8X10 ST (GAUZE/BANDAGES/DRESSINGS) ×3 IMPLANT
DURAPREP 26ML APPLICATOR (WOUND CARE) ×3 IMPLANT
GAUZE SPONGE 4X4 12PLY STRL (GAUZE/BANDAGES/DRESSINGS) ×3 IMPLANT
GAUZE XEROFORM 1X8 LF (GAUZE/BANDAGES/DRESSINGS) ×3 IMPLANT
GLOVE BIO SURGEON STRL SZ7.5 (GLOVE) ×3 IMPLANT
GLOVE BIOGEL PI IND STRL 8 (GLOVE) ×1 IMPLANT
GLOVE BIOGEL PI INDICATOR 8 (GLOVE) ×2
GOWN STRL REUS W/TWL LRG LVL3 (GOWN DISPOSABLE) IMPLANT
IV NS IRRIG 3000ML ARTHROMATIC (IV SOLUTION) ×6 IMPLANT
KIT MIXER ACCUMIX (KITS) IMPLANT
KIT TURNOVER CYSTO (KITS) ×3 IMPLANT
KNEE WRAP E Z 3 GEL PACK (MISCELLANEOUS) ×3 IMPLANT
MANIFOLD NEPTUNE II (INSTRUMENTS) ×3 IMPLANT
PACK ARTHROSCOPY DSU (CUSTOM PROCEDURE TRAY) ×3 IMPLANT
PACK BASIN DAY SURGERY FS (CUSTOM PROCEDURE TRAY) ×3 IMPLANT
PAD ABD 8X10 STRL (GAUZE/BANDAGES/DRESSINGS) ×2 IMPLANT
PAD ARMBOARD 7.5X6 YLW CONV (MISCELLANEOUS) IMPLANT
PAD CAST 4YDX4 CTTN HI CHSV (CAST SUPPLIES) IMPLANT
PADDING CAST COTTON 4X4 STRL (CAST SUPPLIES) ×3
PROBE BIPOLAR 50 DEGREE SUCT (MISCELLANEOUS) IMPLANT
PROBE BIPOLAR ATHRO 135MM 90D (MISCELLANEOUS) ×2 IMPLANT
PROBE HOOK APOLLO (SURGICAL WAND) ×2 IMPLANT
SET ARTHROSCOPY TUBING (MISCELLANEOUS) ×3
SET ARTHROSCOPY TUBING LN (MISCELLANEOUS) ×1 IMPLANT
SHAVER 4.2 MM LANZA 9391A (BLADE) ×3 IMPLANT
SUT ETHILON 3 0 PS 1 (SUTURE) ×3 IMPLANT
SYR CONTROL 10ML LL (SYRINGE) ×3 IMPLANT
TOWEL OR 17X24 6PK STRL BLUE (TOWEL DISPOSABLE) ×3 IMPLANT
TUBE CONNECTING 12'X1/4 (SUCTIONS) ×1
TUBE CONNECTING 12X1/4 (SUCTIONS) ×2 IMPLANT
WAND 30 DEG SABER W/CORD (SURGICAL WAND) IMPLANT
WATER STERILE IRR 500ML POUR (IV SOLUTION) ×3 IMPLANT

## 2018-04-18 NOTE — Transfer of Care (Signed)
Immediate Anesthesia Transfer of Care Note  Patient: Mary Parker  Procedure(s) Performed: Right knee arthroscopy with chondroplasty and lateral release (Right Knee)  Patient Location: PACU  Anesthesia Type:General  Level of Consciousness: awake, alert  and oriented  Airway & Oxygen Therapy: Patient Spontanous Breathing and Patient connected to nasal cannula oxygen  Post-op Assessment: Report given to RN and Post -op Vital signs reviewed and stable  Post vital signs: Reviewed and stable  Last Vitals:  Vitals Value Taken Time  BP    Temp    Pulse 80 04/18/2018  1:42 PM  Resp 12 04/18/2018  1:42 PM  SpO2 99 % 04/18/2018  1:42 PM  Vitals shown include unvalidated device data.  Last Pain:  Vitals:   04/18/18 1059  TempSrc:   PainSc: 8       Patients Stated Pain Goal: 3 (04/18/18 1059)  Complications: No apparent anesthesia complications

## 2018-04-18 NOTE — Anesthesia Postprocedure Evaluation (Signed)
Anesthesia Post Note  Patient: Mary Parker  Procedure(s) Performed: Right knee arthroscopy with chondroplasty and lateral release (Right Knee)     Patient location during evaluation: PACU Anesthesia Type: General Level of consciousness: awake Pain management: pain level controlled Vital Signs Assessment: post-procedure vital signs reviewed and stable Respiratory status: spontaneous breathing Cardiovascular status: stable Anesthetic complications: no    Last Vitals:  Vitals:   04/18/18 1400 04/18/18 1415  BP: 116/81 113/88  Pulse: 80 72  Resp: 18 15  Temp:    SpO2: 100% 100%    Last Pain:  Vitals:   04/18/18 1443  TempSrc:   PainSc: 7                  Madelyn Tlatelpa

## 2018-04-18 NOTE — Anesthesia Postprocedure Evaluation (Signed)
Anesthesia Post Note  Patient: Mary Parker  Procedure(s) Performed: Right knee arthroscopy with chondroplasty and lateral release (Right Knee)     Patient location during evaluation: PACU Anesthesia Type: General Level of consciousness: awake and alert Pain management: pain level controlled Vital Signs Assessment: post-procedure vital signs reviewed and stable Respiratory status: spontaneous breathing, nonlabored ventilation, respiratory function stable and patient connected to nasal cannula oxygen Cardiovascular status: blood pressure returned to baseline and stable Postop Assessment: no apparent nausea or vomiting Anesthetic complications: no    Last Vitals:  Vitals:   04/18/18 1342 04/18/18 1345  BP: 112/67 114/72  Pulse: 80 78  Resp: 12 12  Temp: 36.5 C   SpO2: 99% 99%    Last Pain:  Vitals:   04/18/18 1342  TempSrc:   PainSc: 0-No pain                 Jlyn Cerros COKER

## 2018-04-18 NOTE — Anesthesia Procedure Notes (Signed)
Procedure Name: LMA Insertion Date/Time: 04/18/2018 12:47 PM Performed by: Jessica PriestBeeson, Kassiah Mccrory C, CRNA Pre-anesthesia Checklist: Patient identified, Emergency Drugs available, Suction available and Patient being monitored Patient Re-evaluated:Patient Re-evaluated prior to induction Oxygen Delivery Method: Circle system utilized Preoxygenation: Pre-oxygenation with 100% oxygen Induction Type: IV induction Ventilation: Mask ventilation without difficulty LMA: LMA inserted LMA Size: 4.0 Number of attempts: 1 Airway Equipment and Method: Bite block Placement Confirmation: positive ETCO2 and breath sounds checked- equal and bilateral Tube secured with: Tape Dental Injury: Teeth and Oropharynx as per pre-operative assessment

## 2018-04-18 NOTE — Op Note (Signed)
Surgery Date: 04/18/2018  Surgeon(s): Yolonda Kidaogers, Jason Patrick, MD  ANESTHESIA:  general  FLUIDS: Per anesthesia record.   ESTIMATED BLOOD LOSS: minimal  PREOPERATIVE DIAGNOSES:  1.  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   Right knee patella maltracking  POSTOPERATIVE DIAGNOSES:  same  PROCEDURES PERFORMED:  1.  Right knee arthroscopy with abrasion chondroplasty of medial femoral condyle  2.  Right knee chondroplasty of patella 3. knee arthroscopy with lateral release   DESCRIPTION OF PROCEDURE: [name] is a 37 y.o.-year-old female with right knee chondromalacia with lateral patella tilt. Plans are to proceed with arthroscopic debridements and chondroplasty as well as lateral release. Full discussion held regarding risks benefits alternatives and complications related surgical intervention. Conservative care options reviewed. All questions answered.  The patient was identified in the preoperative holding area and the operative extremity was marked. The patient was brought to the operating room and transferred to operating table in a supine position. Satisfactory general anesthesia was induced by anesthesiology.    Standard anterolateral, anteromedial arthroscopy portals were obtained. The anteromedial portal was obtained with a spinal needle for localization under direct visualization with subsequent diagnostic findings.    Suprapatellar pouch and gutters: mild synovitis or debris. Patella chondral surface: Grade 2 Trochlear chondral surface: Grade 0 Patellofemoral tracking: Lateral tilt noted with thicking of the lateral retinaculum Medial meniscus: intact.  Medial femoral condyle flexion bearing surface: Grade 4 Medial femoral condyle extension bearing surface: Grade 1 Medial tibial plateau: Grade 0 Anterior cruciate ligament:stable Posterior cruciate ligament:stable Lateral meniscus: intact.   Lateral femoral condyle flexion bearing surface:  Grade 0 Lateral femoral condyle extension bearing surface: Grade 0 Lateral tibial plateau: Grade 0  The medial femoral condyle was noted to have an area of 1.5 x 1 cm grade III and IV chondromalacia.  This was stabilized mechanically with the motorized shaver and debrided back to stable borders.  Next utilizing the motorized shaver we performed abrasion chondroplasty of the exposed grade 4 all bone.  The elicited bleeding from the bed when the pump was turned off.  Next we performed chondroplasty of the inferior aspect of the medial patella facet.  The unstable cartilage was stabilized utilizing motorized shaver.  We then turned our attention to the patellofemoral joint.  Was noted to be lateral tilt of the patella when in extension.  Lateral release was indicated.  We performed this utilizing the radiofrequency electrocautery device.  Lateral release was carried from superior pole of patella to the inferior lateral parapatellar portal.  Hemostasis was achieved throughout the release utilizing the electrocautery device.   After completion of synovectomy, diagnostic exam, and debridements as described, all compartments were checked and no residual debris remained. Hemostasis was achieved with the cautery wand. The portals were approximated with buried monocryl. All excess fluid was expressed from the joint.  Xeroform sterile gauze dressings were applied followed by Ace bandage and ice pack.   DISPOSITION: The patient was awakened from general anesthetic, extubated, taken to the recovery room in medically stable condition, no apparent complications. The patient may be weightbearing as tolerated to the operative lower extremity.  Range of motion of right knee as tolerated.

## 2018-04-18 NOTE — H&P (Signed)
ORTHOPAEDIC H and P  REQUESTING PHYSICIAN: Yolonda Kida, MD  PCP:  Benita Stabile, MD  Chief Complaint: Right knee pain, patella maltracking  HPI: Mary Parker is a 37 y.o. female who complains of recalcitrant right knee pain with chondromalacia and lateral tracking.  She presents today for arthroscopic treatment.  No new complaints since our last office visit.  Past Medical History:  Diagnosis Date  . Anxiety   . Arthritis    both knees  . Body aches 12/11/2014  . Chronic insomnia   . Chronic low back pain 05/29/2015  . Common migraine 12/19/2014  . Depression 12/11/2014  . Fibrocystic disease of both breasts   . Fibromyalgia 12/19/2014  . GERD (gastroesophageal reflux disease)   . History of hiatal hernia   . IBS (irritable bowel syndrome)   . Knee pain, right   . Migraines   . Rebound headache 12/19/2014  . Right ovarian cyst 04/25/2017   Complex, check CA 125 and repeat US in 6 weeks   . Sciatica of left side   . Seizures (HCC)    remote past, felt it was related to Chantix   . Vitamin D deficiency   . Wears glasses    Past Surgical History:  Procedure Laterality Date  . BILATERAL SALPINGECTOMY Bilateral 06/21/2017   Procedure: BILATERAL SALPINGECTOMY;  Surgeon: Tilda Burrow, MD;  Location: [facility];  Service: Gynecology;  Laterality: Bilateral;  . CESAREAN SECTION  [id number]  . CHOLECYSTECTOMY  2001  . COLONOSCOPY WITH PROPOFOL N/A 04/07/2017   Procedure: COLONOSCOPY WITH PROPOFOL;  Surgeon: Corbin Ade, MD;  [facility]: [facility];  Service: Endoscopy;  Laterality: N/A;  2:15pm  . ENDOMETRIAL ABLATION    . SUPRACERVICAL ABDOMINAL HYSTERECTOMY N/A 06/21/2017   Procedure: HYSTERECTOMY SUPRACERVICAL ABDOMINAL;  Surgeon: Tilda Burrow, MD;  Location: [facility];  Service: Gynecology;  Laterality: N/A;  . TUBAL LIGATION     Social History   Socioeconomic History  . Marital status: Divorced    Spouse name: Not on file  . Number of children: 3  .  Years of education: HS  . Highest education level: Not on file  Occupational History  . Occupation: [employer]  Social Needs  . Financial resource strain: Not on file  . Food insecurity:    Worry: Not on file    Inability: Not on file  . Transportation needs:    Medical: Not on file    Non-medical: Not on file  Tobacco Use  . Smoking status: Former Smoker    Packs/day: 0.25    Years: 25.00    Pack years: 6.25    Types: Cigarettes  . Smokeless tobacco: Never Used  . Tobacco comment: pt down to 3 cigarettes/d  Substance and Sexual Activity  . Alcohol use: No  . Drug use: No  . Sexual activity: Not Currently    Birth control/protection: Surgical    Comment: supracervical hyst  Lifestyle  . Physical activity:    Days per week: Not on file    Minutes per session: Not on file  . Stress: Not on file  Relationships  . Social connections:    Talks on phone: Not on file    Gets together: Not on file    Attends religious service: Not on file    Active member of club or organization: Not on file    Attends meetings of clubs or organizations: Not on file    Relationship status: Not on  file  Other Topics Concern  . Not on file  Social History Narrative   Patient is right handed.   Patient drinks 1-2 cups of caffeine dailyy.   Patient lives mom stepdad, and children.   Family History  Problem Relation Age of Onset  . Hypertension Mother   . Other Mother        abnormal cells; had hyst  . Obesity Daughter   . Diabetes Daughter 81       youngest  . Obesity Son   . Cancer Maternal Grandmother   . Osteoporosis Maternal Grandmother   . Cancer Maternal Grandfather   . Obesity Daughter   . Diabetes Father   . Hypertension Father   . Alcohol abuse Father   . Colon cancer Neg Hx    Allergies  Allergen Reactions  . Wellbutrin [Bupropion] Other (See Comments)    Suicidal ideation  . Ciprofloxacin Hives, Swelling and Other (See Comments)    Sweating, dizziness  .  Cymbalta [Duloxetine Hcl] Hives, Itching and Rash  . Meloxicam Other (See Comments)    Abdominal cramping and mood changes   . Savella [Milnacipran] Other (See Comments)    Dizziness and nausea, leg cramps  . Topamax [Topiramate] Swelling and Other (See Comments)    Dizziness and slurred speech  . Hydrocodone-Acetaminophen Swelling  . Pristiq [Desvenlafaxine Succinate Er]     UTI  . Doxycycline Nausea Only  . Gabapentin Itching  . Lyrica [Pregabalin] Other (See Comments)    Tremors   . Nortriptyline Other (See Comments)    GERD  . Percocet [Oxycodone-Acetaminophen] Nausea Only   Prior to Admission medications   Medication Sig Start Date End Date Taking? Authorizing Provider  ALPRAZolam Prudy Feeler) 0.5 MG tablet Take 1 tablet (0.5 mg total) by mouth 2 (two) times daily as needed for anxiety. 10/14/16  Yes Cyril Mourning A, NP  Butalbital-APAP-Caffeine 50-325-40 MG capsule TK 1 C PO Q 6 H PRF 30 DAYS 02/21/18  Yes [provider]  cyanocobalamin (,VITAMIN B-12,) 1000 MCG/ML injection INJECT 1 ML INTO THE SKIN ONCE A MONTH. 02/17/18  Yes [provider]  escitalopram (LEXAPRO) 20 MG tablet Take 20 mg by mouth daily. 10/31/17  Yes [provider]  omeprazole (PRILOSEC OTC) 20 MG tablet Take 20 mg by mouth daily.   Yes [provider]  tapentadol (NUCYNTA) 50 MG tablet Take 1 tablet (50 mg total) by mouth every 6 (six) hours as needed. Must last 28 days 04/06/18  Yes York Spaniel, MD  tiZANidine (ZANAFLEX) 2 MG tablet TAKE 1 TABLET BY MOUTH TWICE DAILY, THEN TAKE 2 TABLETS EVERY EVENING Patient taking differently: TAKE 1 TABLET BY MOUTH TWICE DAILY, THEN TAKE 2 TABLETS EVERY EVENING as needed 08/18/17  Yes York Spaniel, MD  varenicline (CHANTIX) 1 MG tablet Take 1 mg by mouth daily.    Yes [provider]  Vitamin D, Ergocalciferol, (DRISDOL) 50000 units CAPS capsule TK 1 C PO 1 TIME A WK FOR 12 WKS 02/18/18  Yes [provider]    albuterol (PROVENTIL) (2.5 MG/3ML) 0.083% nebulizer solution VVN Q 6 H PRN 02/21/18   [provider]  cetirizine HCl (ZYRTEC) 1 MG/ML solution Take 10 mg by mouth at bedtime.    [provider]  levocetirizine (XYZAL) 5 MG tablet TK 1 T PO QHS 02/01/18   [provider]  ondansetron (ZOFRAN ODT) 4 MG disintegrating tablet Take 1 tablet (4 mg total) by mouth every 8 (eight) hours as  needed for nausea or vomiting. 08/24/17   York SpanielWillis, Charles K, MD  propranolol (INDERAL) 20 MG tablet Take 1 tablet (20 mg total) by mouth 2 (two) times daily. 03/16/18   York SpanielWillis, Charles K, MD  rizatriptan (MAXALT-MLT) 10 MG disintegrating tablet DISSOLVE 1 TABLET ON THE TONGUE THREE TIMES DAILY AS NEEDED FOR MIGRAINE 03/09/18   York SpanielWillis, Charles K, MD  traZODone (DESYREL) 50 MG tablet Take 50 mg by mouth at bedtime.    [provider]   No results found.  Positive ROS: All other systems have been reviewed and were otherwise negative with the exception of those mentioned in the HPI and as above.  Physical Exam: General: Alert, no acute distress Cardiovascular: No pedal edema Respiratory: No cyanosis, no use of accessory musculature GI: No organomegaly, abdomen is soft and non-tender Skin: No lesions in the area of chief complaint Neurologic: Sensation intact distally Psychiatric: Patient is competent for consent with normal mood and affect Lymphatic: No axillary or cervical lymphadenopathy    Assessment: 1. Right knee patella chondromalacia 2.  Right patella maltracking  Plan: - To the OR today for arthroscopic right knee lateral release and chondroplasty. - plan for dc home from pacu - all questions solicited and answered to her satisfaction, she has provided informed consent.    Yolonda KidaJason Patrick Gerrianne Aydelott, MD Cell [phone]    04/18/2018 12:10 PM

## 2018-04-18 NOTE — Anesthesia Preprocedure Evaluation (Signed)
Anesthesia Evaluation  Patient identified by MRN, date of birth, ID band Patient awake    Reviewed: Allergy & Precautions, NPO status , Patient's Chart, lab work & pertinent test results  Airway Mallampati: II  TM Distance: >3 FB     Dental   Pulmonary former smoker,    breath sounds clear to auscultation       Cardiovascular negative cardio ROS   Rhythm:Regular Rate:Normal     Neuro/Psych  Headaches,    GI/Hepatic Neg liver ROS, hiatal hernia, GERD  ,  Endo/Other  negative endocrine ROS  Renal/GU negative Renal ROS     Musculoskeletal  (+) Arthritis , Fibromyalgia -  Abdominal   Peds  Hematology   Anesthesia Other Findings   Reproductive/Obstetrics                             Anesthesia Physical Anesthesia Plan  ASA: III  Anesthesia Plan: General   Post-op Pain Management:    Induction: Intravenous  PONV Risk Score and Plan: Treatment may vary due to age or medical condition  Airway Management Planned: LMA  Additional Equipment:   Intra-op Plan:   Post-operative Plan: Extubation in OR  Informed Consent: I have reviewed the patients History and Physical, chart, labs and discussed the procedure including the risks, benefits and alternatives for the proposed anesthesia with the patient or authorized representative who has indicated his/her understanding and acceptance.   Dental advisory given  Plan Discussed with: CRNA and Anesthesiologist  Anesthesia Plan Comments:         Anesthesia Quick Evaluation

## 2018-04-18 NOTE — Discharge Instructions (Signed)
Orthopedic discharge instructions:  -Okay for full weightbearing as tolerated to the right knee.  Use crutches as needed.  May discontinue crutches when able. -Apply ice to the right knee for 20 to 30 minutes out of each hour when awake. -For the prevention of blood clots take a 325 mg aspirin once daily for 6 weeks. -For mild to moderate pain use Tylenol and/or Motrin as needed.  For breakthrough pain use Dilaudid as directed. -May remove your postoperative bandages on postoperative day #3 and begin showering at that point in time.  Cover your portal sites with Band-Aids in between showers. -Return to see Dr. Aundria Rudogers in 10 to 14 days for postoperative wound check.    Post Anesthesia Home Care Instructions  Activity: Get plenty of rest for the remainder of the day. A responsible individual must stay with you for 24 hours following the procedure.  For the next 24 hours, DO NOT: -Drive a car -Advertising copywriterperate machinery -Drink alcoholic beverages -Take any medication unless instructed by your physician -Make any legal decisions or sign important papers.  Meals: Start with liquid foods such as gelatin or soup. Progress to regular foods as tolerated. Avoid greasy, spicy, heavy foods. If nausea and/or vomiting occur, drink only clear liquids until the nausea and/or vomiting subsides. Call your physician if vomiting continues.  Special Instructions/Symptoms: Your throat may feel dry or sore from the anesthesia or the breathing tube placed in your throat during surgery. If this causes discomfort, gargle with warm salt water. The discomfort should disappear within 24 hours.  If you had a scopolamine patch placed behind your ear for the management of post- operative nausea and/or vomiting:  1. The medication in the patch is effective for 72 hours, after which it should be removed.  Wrap patch in a tissue and discard in the trash. Wash hands thoroughly with soap and water. 2. You may remove the patch  earlier than 72 hours if you experience unpleasant side effects which may include dry mouth, dizziness or visual disturbances. 3. Avoid touching the patch. Wash your hands with soap and water after contact with the patch.

## 2018-04-19 ENCOUNTER — Encounter (HOSPITAL_BASED_OUTPATIENT_CLINIC_OR_DEPARTMENT_OTHER): Payer: Self-pay | Admitting: Orthopedic Surgery

## 2018-04-26 ENCOUNTER — Ambulatory Visit: Payer: Medicaid Other | Admitting: Neurology

## 2018-04-27 ENCOUNTER — Other Ambulatory Visit: Payer: Self-pay | Admitting: Neurology

## 2018-04-28 ENCOUNTER — Other Ambulatory Visit: Payer: Self-pay

## 2018-04-28 ENCOUNTER — Ambulatory Visit (HOSPITAL_COMMUNITY): Payer: Medicaid Other | Attending: Orthopedic Surgery | Admitting: Physical Therapy

## 2018-04-28 ENCOUNTER — Telehealth (HOSPITAL_COMMUNITY): Payer: Self-pay | Admitting: Physical Therapy

## 2018-04-28 ENCOUNTER — Telehealth (HOSPITAL_COMMUNITY): Payer: Self-pay | Admitting: Internal Medicine

## 2018-04-28 ENCOUNTER — Encounter (HOSPITAL_COMMUNITY): Payer: Self-pay | Admitting: Physical Therapy

## 2018-04-28 DIAGNOSIS — M6281 Muscle weakness (generalized): Secondary | ICD-10-CM | POA: Diagnosis present

## 2018-04-28 DIAGNOSIS — R600 Localized edema: Secondary | ICD-10-CM | POA: Diagnosis present

## 2018-04-28 DIAGNOSIS — M25561 Pain in right knee: Secondary | ICD-10-CM | POA: Diagnosis present

## 2018-04-28 DIAGNOSIS — M25661 Stiffness of right knee, not elsewhere classified: Secondary | ICD-10-CM | POA: Diagnosis present

## 2018-04-28 DIAGNOSIS — R2689 Other abnormalities of gait and mobility: Secondary | ICD-10-CM

## 2018-04-28 NOTE — Telephone Encounter (Signed)
spoke to NordicJennifer she will ask  MD about Protocol. NF 04/28/2018

## 2018-04-28 NOTE — Telephone Encounter (Signed)
04/28/18  I called to cx because with Medicaid Macy asked me to schedule later in the week and I didn't pay aattention -- I left patient a message
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
## 2018-04-28 NOTE — Telephone Encounter (Signed)
I spoke to [place] with Dr. Aundria Rudogers and he states it's fine to doe eval with Quadriceps and ROM. NF 04/28/2018

## 2018-04-28 NOTE — Patient Instructions (Signed)
  QUAD SET Tighten your top thigh muscle as you attempt to press the back of your knee downward towards the table. Repeat 15 Times Hold 5 Seconds Complete 1 Set Perform 1 Time(s) a Day   HEEL SLIDES - SUPINE Lying on your back with knees straight, slide the affected heel towards your buttock as you bend your knee. Hold a gentle stretch in this position and then return to original position. Repeat 15 Times Hold 5 Seconds Complete 1 Set Perform 1 Time(s) a Day   HIP ABDUCTION - STANDING While standing, raise your leg out to the side. Keep your knee straight and maintain your toes pointed forward the entire time. Use your arms for support if needed for balance and safety.  Repeat 15 Times Hold 1 Second Complete 1 Set Perform 1 Time(s) a Day   HIP EXTENSION - STANDING While standing, balance on one leg and move your other leg in a backward direction. Do not swing the leg. Perform smooth and controlled movements. Keep your trunk stable and without arching during the movement. Use your arms for support if needed for balance and safety. Repeat 15 Times Hold 1 Second Complete 1 Set Perform 1 Time(s) a Day   ANKLE PUMPS - AP Bend your foot up and down at your ankle joint as shown. Repeat 10 Times Hold 1 Second Complete 2 Sets Perform 2 Time(s) a Day

## 2018-05-02 ENCOUNTER — Encounter (HOSPITAL_COMMUNITY): Payer: Self-pay | Admitting: Physical Therapy

## 2018-05-05 ENCOUNTER — Encounter (HOSPITAL_COMMUNITY): Payer: Self-pay | Admitting: Physical Therapy

## 2018-05-05 ENCOUNTER — Ambulatory Visit (HOSPITAL_COMMUNITY): Payer: Medicaid Other | Admitting: Physical Therapy

## 2018-05-08 ENCOUNTER — Encounter (HOSPITAL_COMMUNITY): Payer: Self-pay

## 2018-05-08 ENCOUNTER — Ambulatory Visit (HOSPITAL_COMMUNITY): Payer: Medicaid Other | Attending: Orthopedic Surgery

## 2018-05-08 DIAGNOSIS — R600 Localized edema: Secondary | ICD-10-CM | POA: Insufficient documentation

## 2018-05-08 DIAGNOSIS — R2689 Other abnormalities of gait and mobility: Secondary | ICD-10-CM | POA: Insufficient documentation

## 2018-05-08 DIAGNOSIS — M25661 Stiffness of right knee, not elsewhere classified: Secondary | ICD-10-CM | POA: Insufficient documentation

## 2018-05-08 DIAGNOSIS — M25561 Pain in right knee: Secondary | ICD-10-CM | POA: Insufficient documentation

## 2018-05-08 DIAGNOSIS — M6281 Muscle weakness (generalized): Secondary | ICD-10-CM | POA: Insufficient documentation

## 2018-05-08 NOTE — Telephone Encounter (Signed)
Pt did not show for schedule appointment this date. Author called patient's listed mobile telephone number and left a voicemail explaining reason for call and date of next scheduled visit.   3:40 PM, 05/08/18 Rosamaria LintsAllan C Korion Cuevas, PT, DPT Physical Therapist at [facility] [phone] (office)

## 2018-05-10 ENCOUNTER — Ambulatory Visit (HOSPITAL_COMMUNITY): Payer: Medicaid Other

## 2018-05-10 ENCOUNTER — Encounter (HOSPITAL_COMMUNITY): Payer: Self-pay | Admitting: Physical Therapy

## 2018-05-10 DIAGNOSIS — R2689 Other abnormalities of gait and mobility: Secondary | ICD-10-CM | POA: Diagnosis present

## 2018-05-10 DIAGNOSIS — M6281 Muscle weakness (generalized): Secondary | ICD-10-CM

## 2018-05-10 DIAGNOSIS — M25661 Stiffness of right knee, not elsewhere classified: Secondary | ICD-10-CM

## 2018-05-10 DIAGNOSIS — M25561 Pain in right knee: Secondary | ICD-10-CM

## 2018-05-10 DIAGNOSIS — R600 Localized edema: Secondary | ICD-10-CM | POA: Diagnosis present

## 2018-05-10 NOTE — Therapy (Addendum)
[facility] [address] [place], [place], [address] Phone: [phone]   Fax:  [phone]  Physical Therapy Treatment  Patient Details  Name: Mary Parker MRN: [id number] Date of Birth: Feb 09, 1981 Referring Provider: Duwayne HeckJason Rogers, MD   Encounter Date: 05/10/2018  PT End of Session - 05/10/18 1401    Visit Number  2    Number of Visits  13    Date for PT Re-Evaluation  06/02/18 Begin 1 week after evaluation    Authorization Type  Medicaid    Authorization Time Period  Cert: 4/74/256/28/19 - 06/02/18 (1 week skipped for vacation); Medicaid reauthorization submitted 05/10/18 for 3x/week for 4 weeks and will updated dates, etc. once approval received    Authorization - Visit Number  1    Authorization - Number of Visits  3    PT Start Time  1400 pt late for appointment    PT Stop Time  1424    PT Time Calculation (min)  24 min    Equipment Utilized During Treatment  Gait belt    Activity Tolerance  Patient tolerated treatment well    Behavior During Therapy  WFL for tasks assessed/performed       Past Medical History:  Diagnosis Date  . Anxiety   . Arthritis    both knees  . Body aches 12/11/2014  . Chronic insomnia   . Chronic low back pain 05/29/2015  . Common migraine 12/19/2014  . Depression 12/11/2014  . Fibrocystic disease of both breasts   . Fibromyalgia 12/19/2014  . GERD (gastroesophageal reflux disease)   . History of hiatal hernia   . IBS (irritable bowel syndrome)   . Knee pain, right   . Migraines   . Rebound headache 12/19/2014  . Right ovarian cyst 04/25/2017   Complex, check CA 125 and repeat US in 6 weeks   . Sciatica of left side   . Seizures (HCC)    remote past, felt it was related to Chantix   . Vitamin D deficiency   . Wears glasses     Past Surgical History:  Procedure Laterality Date  . BILATERAL SALPINGECTOMY Bilateral 06/21/2017   Procedure: BILATERAL SALPINGECTOMY;  Surgeon: Tilda BurrowFerguson, John V, MD;   Location: [facility];  Service: Gynecology;  Laterality: Bilateral;  . CESAREAN SECTION  [phone],2000,2004  . CHOLECYSTECTOMY  2001  . COLONOSCOPY WITH PROPOFOL N/A 04/07/2017   Procedure: COLONOSCOPY WITH PROPOFOL;  Surgeon: Corbin Adeourk, Robert M, MD;  [facility];  Service: Endoscopy;  Laterality: N/A;  2:15pm  . ENDOMETRIAL ABLATION    . KNEE ARTHROSCOPY WITH LATERAL RELEASE Right 04/18/2018   Procedure: Right knee arthroscopy with chondroplasty and lateral release;  Surgeon: Yolonda Kidaogers, Jason Patrick, MD;  [facility];  Service: Orthopedics;  Laterality: Right;  60 mins  . SUPRACERVICAL ABDOMINAL HYSTERECTOMY N/A 06/21/2017   Procedure: HYSTERECTOMY SUPRACERVICAL ABDOMINAL;  Surgeon: Tilda BurrowFerguson, John V, MD;  Location: [facility];  Service: Gynecology;  Laterality: N/A;  . TUBAL LIGATION      There were no vitals filed for this visit.  Subjective Assessment - 05/10/18 1402    Subjective  Pt states taht she is much better. Her MD took out her stitches and 30cc of fluid off of her knee on Friday, 05/05/18. He was pleased with her progress so far. She has a new knee brace to apply some compression to her knee to help with the swelling.     Pertinent History  S/P  right knee arthroscopy with chondroplasty and lateral release on 04/18/18    Limitations  Standing;Walking;House hold activities    How long can you sit comfortably?  Not limited    How long can you stand comfortably?  15-20 minutes    How long can you walk comfortably?  15-20 minutes    Diagnostic tests  X-ray of right knee 04/09/15: No abnormalities    Patient Stated Goals  To get back to where she was, being able to do things with her kids    Currently in Pain?  Yes    Pain Score  4     Pain Location  Knee    Pain Orientation  Right    Pain Descriptors / Indicators  Aching;Dull    Pain Type  Surgical pain    Pain Onset  1 to 4 weeks ago    Pain Frequency  Constant    Aggravating Factors   standing and walking    Pain  Relieving Factors  ice and elevation    Effect of Pain on Daily Activities  moderaly affects             [facility] Adult PT Treatment/Exercise - 05/10/18 0001      Knee/Hip Exercises: Stretches   Passive Hamstring Stretch  Right;3 reps;30 seconds;Limitations    Passive Hamstring Stretch Limitations  standing, 6" step    Knee: Self-Stretch to increase Flexion  Right    Knee: Self-Stretch Limitations  10x10 opn 6" step    Gastroc Stretch  Both;3 reps;30 seconds    Gastroc Stretch Limitations  slant board      Knee/Hip Exercises: Standing   Rocker Board  2 minutes    Rocker Board Limitations  R/L      Knee/Hip Exercises: Supine   Knee Extension Limitations  0    Knee Flexion Limitations  115      Manual Therapy   Manual Therapy  Edema management    Manual therapy comments  completed separate rest of treatment    Edema Management  retro massage with BLE elevated              PT Education - 05/10/18 1401    Education Details  reviewed goals, exercise technique    Person(s) Educated  Patient    Methods  Explanation;Handout    Comprehension  Verbalized understanding       PT Short Term Goals - 05/10/18 1646      PT SHORT TERM GOAL #1   Title  Patient will report understanding and regular compliance of HEP to improve, ROM, strength, and overall functional mobility.     Time  3    Period  Weeks    Status  Achieved      PT SHORT TERM GOAL #2   Title  Patient will demonstrate right knee AROM extension/flexion from 0-90 degrees to improve patient's ability to ambulate and ascend and descend stairs.     Baseline  7/10: 0-115deg    Time  3    Period  Weeks    Status  Achieved      PT SHORT TERM GOAL #3   Title  Patient will demonstrate improvement of 1/2 MMT grade in all musculature tested as deficient at evaluation in order to assist patient with ambulation and with ascending and descending stairs.     Baseline  04/28/18: Patient demonstrated right lower extremity  weakness compared to the left lower extremity.     Time  3  Period  Weeks    Status  On-going      PT SHORT TERM GOAL #4   Title  Patient will demonstrate ability to maintain single limb stance on each lower extremity for 10 seconds indicating improved balance and safety.     Baseline  continued balance deficits    Time  3    Period  Weeks    Status  On-going        PT Long Term Goals - 05/10/18 1646      PT LONG TERM GOAL #1   Title  Patient will demonstrate improvement of 1 MMT grade in all musculature tested as deficient at evaluation in order to assist patient with ambulation and with ascending and descending stairs.     Baseline  04/28/18: Patient demonstrated right lower extremity weakness compared to the left lower extremity.     Time  5    Period  Weeks    Status  On-going      PT LONG TERM GOAL #2   Title  Patient will demonstrate right knee AROM extension/flexion from 0-115 degrees to improve patient's ability to ambulate and ascend and descend stairs.     Baseline  7/10: 0-115    Time  5    Period  Weeks    Status  Achieved      PT LONG TERM GOAL #3   Title  Patient will demonstrate improvement of 5xSTS of 10 seconds and with lower extremities in line indicating improved balance and overall functional mobility.     Baseline  04/28/18: 26.06 seconds with the left lower extremity further back and upper extremities braced on legs    Time  5    Period  Weeks    Status  On-going      PT LONG TERM GOAL #4   Title  Patient will demonstrate ability to ascend and descend 4 6-inch stairs with single upper extremity support, facing forward, and with reciprocal gait pattern indicating improved functional strength and safety.     Baseline  continued difficulty with stairs    Time  5    Period  Weeks    Status  On-going      PT LONG TERM GOAL #5   Title  Patient will report ability to put on shoes and socks with no more than minimal difficulty.     Baseline  05/1018:  Patient reported that she has moderate difficulty with putting on socks and shoes at this point. (was max)    Time  5    Period  Weeks    Status  On-going            Plan - 05/10/18 1427    Clinical Impression Statement  Session limited as pt arrived late for appointment. Reviewed goals and issued copy of eval; no f/u questions. Rest of session focused on knee ROM and decreasing edema. Drastic improvements in AROM this date as she was 0 to 115deg (was 3-64deg eval). Continue as planned, progressing as able.     Rehab Potential  Good    Clinical Impairments Affecting Rehab Potential  Positive: Support system and motivated; Negative: history of knee problem    PT Frequency  3x / week    PT Duration  4 weeks To begin treatment 1 week from evaluation on 05/05/18    PT Treatment/Interventions  ADLs/Self Care Home Management;Aquatic Therapy;Cryotherapy;Electrical Stimulation;Iontophoresis 4mg /ml Dexamethasone;Moist Heat;DME Instruction;Gait training;Stair training;Functional mobility training;Therapeutic activities;Therapeutic exercise;Balance training;Neuromuscular re-education;Patient/family education;Orthotic Fit/Training;Manual techniques;Scar mobilization;Passive  range of motion;Dry needling;Energy conservation;Splinting;Taping    PT Next Visit Plan  avoid high-impact exercises, focus on improving ROM and decreasing edema initially, progress as tolerated    PT Home Exercise Plan  04/28/18: Quad sets 1 x 15 5'' holds, heel slides 1 x 15 5'' holds; ankle pumps 2 x 10 2x/day; standing hip abduction/extension on the right 1 x 15 1x/day    Consulted and Agree with Plan of Care  Patient       Patient will benefit from skilled therapeutic intervention in order to improve the following deficits and impairments:  Abnormal gait, Decreased balance, Decreased endurance, Decreased mobility, Difficulty walking, Hypomobility, Decreased range of motion, Increased edema, Improper body mechanics, Decreased  activity tolerance, Decreased strength, Impaired flexibility, Pain  Visit Diagnosis: Acute pain of right knee  Stiffness of right knee, not elsewhere classified  Localized edema  Muscle weakness (generalized)  Other abnormalities of gait and mobility     Problem List Patient Active Problem List   Diagnosis Date Noted  . 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 S/P abdominal supracervical subtotal hysterectomy 06/21/2017  . Pelvic adhesions 06/09/2017  . History of ovarian cyst 06/09/2017  . Right ovarian cyst 04/25/2017  . Encounter for gynecological examination with Papanicolaou smear of cervix 04/19/2017  . Rectal bleeding 04/02/2017  . LLQ pain 04/01/2017  . Constipation 04/01/2017  . Concussion with loss of consciousness 11/16/2016  . Anxiety 10/14/2016  . Chronic low back pain 05/29/2015  . Common migraine 12/19/2014  . Fibromyalgia 12/19/2014  . Rebound headache 12/19/2014  . Depression 12/11/2014  . Body aches 12/11/2014       Jac Canavan PT, DPT  [facility] [address] [place], [place], [address] Phone: [phone]   Fax:  [phone]  Name: Mary Parker MRN: [id number] Date of Birth: Jul 04, 1981

## 2018-05-12 ENCOUNTER — Encounter (HOSPITAL_COMMUNITY): Payer: Self-pay | Admitting: Physical Therapy

## 2018-05-15 ENCOUNTER — Encounter (HOSPITAL_COMMUNITY): Payer: Self-pay | Admitting: Physical Therapy

## 2018-05-15 ENCOUNTER — Telehealth (HOSPITAL_COMMUNITY): Payer: Self-pay | Admitting: Internal Medicine

## 2018-05-15 ENCOUNTER — Ambulatory Visit (HOSPITAL_COMMUNITY): Payer: Medicaid Other | Admitting: Physical Therapy

## 2018-05-15 NOTE — Telephone Encounter (Signed)
I called and left a message to cancel since we didn't have approval back from medicaid ... 3:09 pm  05/15/18

## 2018-05-17 ENCOUNTER — Encounter (HOSPITAL_COMMUNITY): Payer: Self-pay | Admitting: Physical Therapy

## 2018-05-17 ENCOUNTER — Ambulatory Visit (HOSPITAL_COMMUNITY): Payer: Medicaid Other

## 2018-05-17 ENCOUNTER — Encounter (HOSPITAL_COMMUNITY): Payer: Self-pay

## 2018-05-17 DIAGNOSIS — M25561 Pain in right knee: Secondary | ICD-10-CM

## 2018-05-17 DIAGNOSIS — M6281 Muscle weakness (generalized): Secondary | ICD-10-CM

## 2018-05-17 DIAGNOSIS — M25661 Stiffness of right knee, not elsewhere classified: Secondary | ICD-10-CM

## 2018-05-17 DIAGNOSIS — R600 Localized edema: Secondary | ICD-10-CM

## 2018-05-17 DIAGNOSIS — R2689 Other abnormalities of gait and mobility: Secondary | ICD-10-CM

## 2018-05-17 NOTE — Therapy (Signed)
[facility] [address] [place], [place], [address] Phone: [phone]   Fax:  [phone]  Physical Therapy Treatment  Patient Details  Name: Oneita HurtCasey J Nappier MRN: [id number] Date of Birth: 16-Aug-1981 Referring Provider: Duwayne HeckJason Rogers, MD   Encounter Date: 05/17/2018  PT End of Session - 05/17/18 1738    Visit Number  3    Number of Visits  13    Date for PT Re-Evaluation  06/02/18    Authorization Type  Medicaid    Authorization Time Period  Cert: 1/66/066/28/19 - 06/02/18 (1 week skipped for vacation); Medicaid reauthorization 12 sessions approved 7/16-->06/12/18    Authorization - Visit Number  1    Authorization - Number of Visits  12    PT Start Time  1734    PT Stop Time  1813    PT Time Calculation (min)  39 min    Activity Tolerance  Patient tolerated treatment well    Behavior During Therapy  WFL for tasks assessed/performed       Past Medical History:  Diagnosis Date  . Anxiety   . Arthritis    both knees  . Body aches 12/11/2014  . Chronic insomnia   . Chronic low back pain 05/29/2015  . Common migraine 12/19/2014  . Depression 12/11/2014  . Fibrocystic disease of both breasts   . Fibromyalgia 12/19/2014  . GERD (gastroesophageal reflux disease)   . History of hiatal hernia   . IBS (irritable bowel syndrome)   . Knee pain, right   . Migraines   . Rebound headache 12/19/2014  . Right ovarian cyst 04/25/2017   Complex, check CA 125 and repeat US in 6 weeks   . Sciatica of left side   . Seizures (HCC)    remote past, felt it was related to Chantix   . Vitamin D deficiency   . Wears glasses     Past Surgical History:  Procedure Laterality Date  . BILATERAL SALPINGECTOMY Bilateral 06/21/2017   Procedure: BILATERAL SALPINGECTOMY;  Surgeon: Tilda BurrowFerguson, John V, MD;  Location: [facility];  Service: Gynecology;  Laterality: Bilateral;  . CESAREAN SECTION  [phone],2000,2004  . CHOLECYSTECTOMY  2001  . COLONOSCOPY WITH PROPOFOL N/A 04/07/2017    Procedure: COLONOSCOPY WITH PROPOFOL;  Surgeon: Corbin Adeourk, Robert M, MD;  [facility];  Service: Endoscopy;  Laterality: N/A;  2:15pm  . ENDOMETRIAL ABLATION    . KNEE ARTHROSCOPY WITH LATERAL RELEASE Right 04/18/2018   Procedure: Right knee arthroscopy with chondroplasty and lateral release;  Surgeon: Yolonda Kidaogers, Jason Patrick, MD;  [facility];  Service: Orthopedics;  Laterality: Right;  60 mins  . SUPRACERVICAL ABDOMINAL HYSTERECTOMY N/A 06/21/2017   Procedure: HYSTERECTOMY SUPRACERVICAL ABDOMINAL;  Surgeon: Tilda BurrowFerguson, John V, MD;  Location: [facility];  Service: Gynecology;  Laterality: N/A;  . TUBAL LIGATION      There were no vitals filed for this visit.  Subjective Assessment - 05/17/18 1736    Subjective  Pt reports she has been on her feet a lot for the last couple days and increased pain and swelling today, current pain scale 7-8/10.      Pertinent History  S/P right knee arthroscopy with chondroplasty and lateral release on 04/18/18    Patient Stated Goals  To get back to where she was, being able to do things with her kids    Currently in Pain?  Yes    Pain Score  8     Pain Location  Knee    Pain Orientation  Right    Pain Descriptors / Indicators  Aching;Sore    Pain Type  Surgical pain    Pain Onset  1 to 4 weeks ago    Pain Frequency  Constant    Aggravating Factors   standing and walking    Pain Relieving Factors  ice and elevation    Effect of Pain on Daily Activities  moderately affects                       [facility] Adult PT Treatment/Exercise - 05/17/18 0001      Knee/Hip Exercises: Stretches   Passive Hamstring Stretch  Right;3 reps;30 seconds;Limitations    Passive Hamstring Stretch Limitations  standing, 12" step    Quad Stretch  Right;3 reps;30 seconds prone with rope    Knee: Self-Stretch to increase Flexion  Right    Knee: Self-Stretch Limitations  10x10 on 12" step    Gastroc Stretch  Both;3 reps;30 seconds     Gastroc Stretch Limitations  slant board      Knee/Hip Exercises: Standing   Heel Raises  15 reps;Limitations    Heel Raises Limitations  Toe raises 15x    Rocker Board  2 minutes    Rocker Board Limitations  lateral and DF/PF    SLS  Rt 60", Lt 60" 1st attempt      Manual Therapy   Manual Therapy  Edema management;Myofascial release    Manual therapy comments  completed separate rest of treatment    Edema Management  retro massage with BLE elevated    Myofascial Release  MFR to scar tissue to reduce adhesions               PT Short Term Goals - 05/17/18 1754      PT SHORT TERM GOAL #1   Title  Patient will report understanding and regular compliance of HEP to improve, ROM, strength, and overall functional mobility.     Status  Achieved      PT SHORT TERM GOAL #2   Title  Patient will demonstrate right knee AROM extension/flexion from 0-90 degrees to improve patient's ability to ambulate and ascend and descend stairs.     Baseline  7/10: 0-115deg    Status  Achieved      PT SHORT TERM GOAL #4   Title  Patient will demonstrate ability to maintain single limb stance on each lower extremity for 10 seconds indicating improved balance and safety.     Baseline  7/17: Lt and Rt 60" 1st attempt    Status  Achieved        PT Long Term Goals - 05/10/18 1646      PT LONG TERM GOAL #1   Title  Patient will demonstrate improvement of 1 MMT grade in all musculature tested as deficient at evaluation in order to assist patient with ambulation and with ascending and descending stairs.     Baseline  04/28/18: Patient demonstrated right lower extremity weakness compared to the left lower extremity.     Time  5    Period  Weeks    Status  On-going      PT LONG TERM GOAL #2   Title  Patient will demonstrate right knee AROM extension/flexion from 0-115 degrees to improve patient's ability to ambulate and ascend and descend stairs.     Baseline  7/10: 0-115    Time  5    Period  Weeks    Status  Achieved      PT LONG TERM GOAL #3   Title  Patient will demonstrate improvement of 5xSTS of 10 seconds and with lower extremities in line indicating improved balance and overall functional mobility.     Baseline  04/28/18: 26.06 seconds with the left lower extremity further back and upper extremities braced on legs    Time  5    Period  Weeks    Status  On-going      PT LONG TERM GOAL #4   Title  Patient will demonstrate ability to ascend and descend 4 6-inch stairs with single upper extremity support, facing forward, and with reciprocal gait pattern indicating improved functional strength and safety.     Baseline  continued difficulty with stairs    Time  5    Period  Weeks    Status  On-going      PT LONG TERM GOAL #5   Title  Patient will report ability to put on shoes and socks with no more than minimal difficulty.     Baseline  05/1018: Patient reported that she has moderate difficulty with putting on socks and shoes at this point. (was max)    Time  5    Period  Weeks    Status  On-going            Plan - 05/17/18 1812    Clinical Impression Statement  Pt progressing well towards goals with AROM at 0-130 degrees (was 0-115 degress last session) and improved stability with ability to SLS 60" BLE 1st attempt.  Pt limited this session by high pain scale wiht edema present proximal knee.  This session added strengthenig to improve gait mechanics and quad stretch to improve flexion.  EOS with manual technqiues to address adhesions on scar tissue, patella mobs for mobility and retrograde massage to address edema proximal knee.  Pt reports pain reduced to 6/10 following manual.  Reviewed RICE technqiues to address pain and edema control.      Rehab Potential  Good    Clinical Impairments Affecting Rehab Potential  Positive: Support system and motivated; Negative: history of knee problem    PT Frequency  3x / week    PT Duration  4 weeks To begin treatment 1 week  from eval 05/05/18    PT Treatment/Interventions  ADLs/Self Care Home Management;Aquatic Therapy;Cryotherapy;Electrical Stimulation;Iontophoresis 4mg /ml Dexamethasone;Moist Heat;DME Instruction;Gait training;Stair training;Functional mobility training;Therapeutic activities;Therapeutic exercise;Balance training;Neuromuscular re-education;Patient/family education;Orthotic Fit/Training;Manual techniques;Scar mobilization;Passive range of motion;Dry needling;Energy conservation;Splinting;Taping    PT Next Visit Plan  Next session begin forward and lateral step up and STS.  Continue to avoid high-impact exercises and manual techniques to address edema.      PT Home Exercise Plan  04/28/18: Quad sets 1 x 15 5'' holds, heel slides 1 x 15 5'' holds; ankle pumps 2 x 10 2x/day; standing hip abduction/extension on the right 1 x 15 1x/day       Patient will benefit from skilled therapeutic intervention in order to improve the following deficits and impairments:  Abnormal gait, Decreased balance, Decreased endurance, Decreased mobility, Difficulty walking, Hypomobility, Decreased range of motion, Increased edema, Improper body mechanics, Decreased activity tolerance, Decreased strength, Impaired flexibility, Pain  Visit Diagnosis: Acute pain of right knee  Stiffness of right knee, not elsewhere classified  Localized edema  Muscle weakness (generalized)  Other abnormalities of gait and mobility     Problem List Patient Active Problem List   Diagnosis Date Noted  .  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 S/P abdominal supracervical subtotal hysterectomy 06/21/2017  . Pelvic adhesions 06/09/2017  . History of ovarian cyst 06/09/2017  . Right ovarian cyst 04/25/2017  . Encounter for gynecological examination with Papanicolaou smear of cervix 04/19/2017  . Rectal bleeding 04/02/2017  . LLQ pain 04/01/2017  . Constipation 04/01/2017  . Concussion with loss of consciousness 11/16/2016  . Anxiety  10/14/2016  . Chronic low back pain 05/29/2015  . Common migraine 12/19/2014  . Fibromyalgia 12/19/2014  . Rebound headache 12/19/2014  . Depression 12/11/2014  . Body aches 12/11/2014   Becky Sax, LPTA; CBIS [phone]  [place], [place] 05/17/2018, 6:25 PM  [facility] [address] [place], [place], [address] Phone: [phone]   Fax:  [phone]  Name: SONNET RIZOR MRN: [id number] Date of Birth: [birth year]

## 2018-05-19 ENCOUNTER — Encounter (HOSPITAL_COMMUNITY): Payer: Self-pay | Admitting: Physical Therapy

## 2018-05-19 ENCOUNTER — Encounter (HOSPITAL_COMMUNITY): Payer: Self-pay

## 2018-05-19 ENCOUNTER — Ambulatory Visit (HOSPITAL_COMMUNITY): Payer: Medicaid Other

## 2018-05-19 DIAGNOSIS — M6281 Muscle weakness (generalized): Secondary | ICD-10-CM

## 2018-05-19 DIAGNOSIS — M25661 Stiffness of right knee, not elsewhere classified: Secondary | ICD-10-CM

## 2018-05-19 DIAGNOSIS — M25561 Pain in right knee: Secondary | ICD-10-CM

## 2018-05-19 DIAGNOSIS — R2689 Other abnormalities of gait and mobility: Secondary | ICD-10-CM

## 2018-05-19 DIAGNOSIS — R600 Localized edema: Secondary | ICD-10-CM

## 2018-05-19 NOTE — Therapy (Signed)
[facility] [place], [place], [address] Phone: [phone]   Fax:  [phone]  Physical Therapy Treatment  Patient Details  Name: Mary Parker MRN: [id number] Date of Birth: 1981-08-20 Referring Provider: Victorino December, MD   Encounter Date: 05/19/2018  PT End of Session - 05/19/18 1747    Visit Number  4    Number of Visits  13    Date for PT Re-Evaluation  06/02/18    Authorization Type  Medicaid    Authorization Time Period  Cert: 04/07/36 - 1/0/62 (1 week skipped for vacation); Medicaid reauthorization 12 sessions approved 7/16-->06/12/18    Authorization - Visit Number  2    Authorization - Number of Visits  12    PT Start Time  6948 pt late for apt    PT Stop Time  1820 5' ice at EOS, not included charges    PT Time Calculation (min)  41 min    Activity Tolerance  Patient tolerated treatment well    Behavior During Therapy  [facility] for tasks assessed/performed       Past Medical History:  Diagnosis Date  . Anxiety   . Arthritis    both knees  . Body aches 12/11/2014  . Chronic insomnia   . Chronic low back pain 05/29/2015  . Common migraine 12/19/2014  . Depression 12/11/2014  . Fibrocystic disease of both breasts   . Fibromyalgia 12/19/2014  . GERD (gastroesophageal reflux disease)   . History of hiatal hernia   . IBS (irritable bowel syndrome)   . Knee pain, right   . Migraines   . Rebound headache 12/19/2014  . Right ovarian cyst 04/25/2017   Complex, check CA 125 and repeat US in 6 weeks   . Sciatica of left side   . Seizures ([place])    remote past, felt it was related to Chantix   . Vitamin D deficiency   . Wears glasses     Past Surgical History:  Procedure Laterality Date  . BILATERAL SALPINGECTOMY Bilateral 06/21/2017   Procedure: BILATERAL SALPINGECTOMY;  Surgeon: Jonnie Kind, MD;  Location: [facility];  Service: Gynecology;  Laterality: Bilateral;  . CESAREAN SECTION  [phone]  .  CHOLECYSTECTOMY  2001  . COLONOSCOPY WITH PROPOFOL N/A 04/07/2017   Procedure: COLONOSCOPY WITH PROPOFOL;  Surgeon: Daneil Dolin, MD;  [facility]: [facility];  Service: Endoscopy;  Laterality: N/A;  2:15pm  . ENDOMETRIAL ABLATION    . KNEE ARTHROSCOPY WITH LATERAL RELEASE Right 04/18/2018   Procedure: Right knee arthroscopy with chondroplasty and lateral release;  Surgeon: Nicholes Stairs, MD;  Location: [facility];  Service: Orthopedics;  Laterality: Right;  60 mins  . SUPRACERVICAL ABDOMINAL HYSTERECTOMY N/A 06/21/2017   Procedure: HYSTERECTOMY SUPRACERVICAL ABDOMINAL;  Surgeon: Jonnie Kind, MD;  Location: [facility];  Service: Gynecology;  Laterality: N/A;  . TUBAL LIGATION      There were no vitals filed for this visit.  Subjective Assessment - 05/19/18 1744    Subjective  Pt reports she has slight increased pain following crazy day, pain scale 8/10 throbbing, achey and stiff pain.      Pertinent History  S/P right knee arthroscopy with chondroplasty and lateral release on 04/18/18    Patient Stated Goals  To get back to where she was, being able to do things with her kids    Currently in Pain?  Yes    Pain Score  8  Pain Location  Knee    Pain Orientation  Right    Pain Descriptors / Indicators  Aching;Throbbing;Tightness    Pain Type  Surgical pain    Pain Onset  1 to 4 weeks ago    Pain Frequency  Constant    Aggravating Factors   standing and walking    Pain Relieving Factors  ice and elevation    Effect of Pain on Daily Activities  moderately affects                       [facility] Adult PT Treatment/Exercise - 05/19/18 0001      Knee/Hip Exercises: Stretches   Active Hamstring Stretch  2 reps;30 seconds    Passive Hamstring Stretch Limitations  standing, 12" step      Knee/Hip Exercises: Standing   Heel Raises  15 reps;Limitations    Heel Raises Limitations  Toe raises 15x    Lateral Step Up  Right;10 reps;Hand Hold: 2;Step Height:  4"    Forward Step Up  5 reps;Hand Hold: 1;Step Height: 4";10 reps;Step Height: 6"    Rocker Board  2 minutes    Rocker Board Limitations  lateral and DF/PF    SLS with Vectors  3x5" with intermittent HHA      Knee/Hip Exercises: Seated   Sit to Sand  10 reps;without UE support eccentric control      Manual Therapy   Manual Therapy  Edema management;Myofascial release    Manual therapy comments  completed separate rest of treatment    Edema Management  retro massage with BLE elevated    Myofascial Release  MFR to scar tissue to reduce adhesions               PT Short Term Goals - 05/17/18 1754      PT SHORT TERM GOAL #1   Title  Patient will report understanding and regular compliance of HEP to improve, ROM, strength, and overall functional mobility.     Status  Achieved      PT SHORT TERM GOAL #2   Title  Patient will demonstrate right knee AROM extension/flexion from 0-90 degrees to improve patient's ability to ambulate and ascend and descend stairs.     Baseline  7/10: 0-115deg    Status  Achieved      PT SHORT TERM GOAL #4   Title  Patient will demonstrate ability to maintain single limb stance on each lower extremity for 10 seconds indicating improved balance and safety.     Baseline  7/17: Lt and Rt 60" 1st attempt    Status  Achieved        PT Long Term Goals - 05/10/18 1646      PT LONG TERM GOAL #1   Title  Patient will demonstrate improvement of 1 MMT grade in all musculature tested as deficient at evaluation in order to assist patient with ambulation and with ascending and descending stairs.     Baseline  04/28/18: Patient demonstrated right lower extremity weakness compared to the left lower extremity.     Time  5    Period  Weeks    Status  On-going      PT LONG TERM GOAL #2   Title  Patient will demonstrate right knee AROM extension/flexion from 0-115 degrees to improve patient's ability to ambulate and ascend and descend stairs.     Baseline  7/10:  0-115    Time  5  Period  Weeks    Status  Achieved      PT LONG TERM GOAL #3   Title  Patient will demonstrate improvement of 5xSTS of 10 seconds and with lower extremities in line indicating improved balance and overall functional mobility.     Baseline  04/28/18: 26.06 seconds with the left lower extremity further back and upper extremities braced on legs    Time  5    Period  Weeks    Status  On-going      PT LONG TERM GOAL #4   Title  Patient will demonstrate ability to ascend and descend 4 6-inch stairs with single upper extremity support, facing forward, and with reciprocal gait pattern indicating improved functional strength and safety.     Baseline  continued difficulty with stairs    Time  5    Period  Weeks    Status  On-going      PT LONG TERM GOAL #5   Title  Patient will report ability to put on shoes and socks with no more than minimal difficulty.     Baseline  05/1018: Patient reported that she has moderate difficulty with putting on socks and shoes at this point. (was max)    Time  5    Period  Weeks    Status  On-going            Plan - 05/19/18 1816    Clinical Impression Statement  This session progressed to functional strengthening as mobility goal met last session.  Added lateral and forward steps ups, STS and vector stance for functional strengthening and hip stability.  Pt able to complete all exercises correctly with no reports of increased pain.  EOS with manual techniques to address edema present proximal knee.  Pt reports going to second job following session and unable to apply ice on knee today due to busy at work, added ice to knee for pain and edema control.  Reports pain redcued to 5/10 at EOS.     Rehab Potential  Good    Clinical Impairments Affecting Rehab Potential  Positive: Support system and motivated; Negative: history of knee problem    PT Frequency  3x / week    PT Duration  4 weeks Began tx 1 week following eval 05/05/18    PT  Treatment/Interventions  ADLs/Self Care Home Management;Aquatic Therapy;Cryotherapy;Electrical Stimulation;Iontophoresis 58m/ml Dexamethasone;Moist Heat;DME Instruction;Gait training;Stair training;Functional mobility training;Therapeutic activities;Therapeutic exercise;Balance training;Neuromuscular re-education;Patient/family education;Orthotic Fit/Training;Manual techniques;Scar mobilization;Passive range of motion;Dry needling;Energy conservation;Splinting;Taping    PT Next Visit Plan  Progress to squats and sidestep next session.  Continue to avoid high-impact exercises and manual techniques to address edema.      PT Home Exercise Plan  04/28/18: Quad sets 1 x 15 5'' holds, heel slides 1 x 15 5'' holds; ankle pumps 2 x 10 2x/day; standing hip abduction/extension on the right 1 x 15 1x/day       Patient will benefit from skilled therapeutic intervention in order to improve the following deficits and impairments:  Abnormal gait, Decreased balance, Decreased endurance, Decreased mobility, Difficulty walking, Hypomobility, Decreased range of motion, Increased edema, Improper body mechanics, Decreased activity tolerance, Decreased strength, Impaired flexibility, Pain  Visit Diagnosis: Acute pain of right knee  Stiffness of right knee, not elsewhere classified  Localized edema  Muscle weakness (generalized)  Other abnormalities of gait and mobility     Problem List Patient Active Problem List   Diagnosis Date Noted  . Chronic left SI joint pain  07/07/2017  . S/P abdominal supracervical subtotal hysterectomy 06/21/2017  . Pelvic adhesions 06/09/2017  . History of ovarian cyst 06/09/2017  . Right ovarian cyst 04/25/2017  . Encounter for gynecological examination with Papanicolaou smear of cervix 04/19/2017  . Rectal bleeding 04/02/2017  . LLQ pain 04/01/2017  . Constipation 04/01/2017  . Concussion with loss of consciousness 11/16/2016  . Anxiety 10/14/2016  . Chronic low back pain  05/29/2015  . Common migraine 12/19/2014  . Fibromyalgia 12/19/2014  . Rebound headache 12/19/2014  . Depression 12/11/2014  . Body aches 12/11/2014   Ihor Austin, LPTA; CBIS [phone]  Arla, B[place] 05/19/2018, 6:31 PM  [facility] [address] [place], [place], [address] Phone: [phone]   Fax:  [phone]  Name: Mary Parker MRN: [id number] Date of Birth: November 15, 1980

## 2018-05-22 ENCOUNTER — Ambulatory Visit (HOSPITAL_COMMUNITY): Payer: Medicaid Other | Admitting: Physical Therapy

## 2018-05-22 ENCOUNTER — Encounter (HOSPITAL_COMMUNITY): Payer: Self-pay | Admitting: Physical Therapy

## 2018-05-23 ENCOUNTER — Other Ambulatory Visit: Payer: Self-pay | Admitting: Neurology

## 2018-05-24 ENCOUNTER — Encounter (HOSPITAL_COMMUNITY): Payer: Self-pay | Admitting: Physical Therapy

## 2018-05-24 ENCOUNTER — Telehealth (HOSPITAL_COMMUNITY): Payer: Self-pay

## 2018-05-24 ENCOUNTER — Ambulatory Visit (HOSPITAL_COMMUNITY): Payer: Medicaid Other

## 2018-05-24 NOTE — Telephone Encounter (Signed)
No show, called and left message concerning missed apt today.  Included contact info and requested pt to call back to schedule further apts.    [address] W[address] Illinois DriveCasey Jovante Hammitt, LPTA; CBIS [phone]

## 2018-05-26 ENCOUNTER — Encounter (HOSPITAL_COMMUNITY): Payer: Self-pay

## 2018-05-29 ENCOUNTER — Encounter (HOSPITAL_COMMUNITY): Payer: Self-pay | Admitting: Physical Therapy

## 2018-05-30 ENCOUNTER — Ambulatory Visit (HOSPITAL_COMMUNITY): Payer: Medicaid Other

## 2018-05-30 ENCOUNTER — Encounter (HOSPITAL_COMMUNITY): Payer: Self-pay

## 2018-05-30 ENCOUNTER — Encounter (HOSPITAL_COMMUNITY): Payer: Self-pay | Admitting: Physical Therapy

## 2018-05-30 DIAGNOSIS — M25661 Stiffness of right knee, not elsewhere classified: Secondary | ICD-10-CM

## 2018-05-30 DIAGNOSIS — R600 Localized edema: Secondary | ICD-10-CM

## 2018-05-30 DIAGNOSIS — R2689 Other abnormalities of gait and mobility: Secondary | ICD-10-CM

## 2018-05-30 DIAGNOSIS — M6281 Muscle weakness (generalized): Secondary | ICD-10-CM

## 2018-05-30 DIAGNOSIS — M25561 Pain in right knee: Secondary | ICD-10-CM

## 2018-05-30 NOTE — Therapy (Signed)
[facility] [address] [place], [place], [address] Phone: [phone]   Fax:  [phone]  Physical Therapy Treatment  Patient Details  Name: Mary Parker MRN: [id number] Date of Birth: 1981-08-20 Referring Provider: Duwayne Heck, MD   Encounter Date: 05/30/2018  PT End of Session - 05/30/18 1737    Visit Number  5    Number of Visits  13    Date for PT Re-Evaluation  06/02/18    Authorization Type  Medicaid    Authorization Time Period  Cert: 04/06/29 - 06/02/18 (1 week skipped for vacation); Medicaid reauthorization 12 sessions approved 7/16-->06/02/18    Authorization - Visit Number  3    Authorization - Number of Visits  12    PT Start Time  1733    PT Stop Time  1814    PT Time Calculation (min)  41 min    Activity Tolerance  Patient tolerated treatment well    Behavior During Therapy  W[facility] for tasks assessed/performed       Past Medical History:  Diagnosis Date  . Anxiety   . Arthritis    both knees  . Body aches 12/11/2014  . Chronic insomnia   . Chronic low back pain 05/29/2015  . Common migraine 12/19/2014  . Depression 12/11/2014  . Fibrocystic disease of both breasts   . Fibromyalgia 12/19/2014  . GERD (gastroesophageal reflux disease)   . History of hiatal hernia   . IBS (irritable bowel syndrome)   . Knee pain, right   . Migraines   . Rebound headache 12/19/2014  . Right ovarian cyst 04/25/2017   Complex, check CA 125 and repeat US in 6 weeks   . Sciatica of left side   . Seizures ([facility])    remote past, felt it was related to Chantix   . Vitamin D deficiency   . Wears glasses     Past Surgical History:  Procedure Laterality Date  . BILATERAL SALPINGECTOMY Bilateral 06/21/2017   Procedure: BILATERAL SALPINGECTOMY;  Surgeon: Tilda Burrow, MD;  Location: [facility];  Service: Gynecology;  Laterality: Bilateral;  . CESAREAN SECTION  [phone]  . CHOLECYSTECTOMY  2001  . COLONOSCOPY WITH PROPOFOL N/A 04/07/2017    Procedure: COLONOSCOPY WITH PROPOFOL;  Surgeon: Corbin Ade, MD;  [facility];  Service: Endoscopy;  Laterality: N/A;  2:15pm  . ENDOMETRIAL ABLATION    . KNEE ARTHROSCOPY WITH LATERAL RELEASE Right 04/18/2018   Procedure: Right knee arthroscopy with chondroplasty and lateral release;  Surgeon: Yolonda Kida, MD;  Location: [facility];  Service: Orthopedics;  Laterality: Right;  60 mins  . SUPRACERVICAL ABDOMINAL HYSTERECTOMY N/A 06/21/2017   Procedure: HYSTERECTOMY SUPRACERVICAL ABDOMINAL;  Surgeon: Tilda Burrow, MD;  Location: [facility];  Service: Gynecology;  Laterality: N/A;  . TUBAL LIGATION      There were no vitals filed for this visit.  Subjective Assessment - 05/30/18 1734    Subjective  Pt stated she had a lot of walking outside up and down hills this weekend, increased soreness on medial aspect on knee.  Reports a painful pop this weekend with increased pain following for a couple of hours.  Pt c/o a pull on knot lateral aspect of knee    Pertinent History  S/P right knee arthroscopy with chondroplasty and lateral release on 04/18/18    Patient Stated Goals  To get back to where she was, being able to do things with her kids  Currently in Pain?  Yes    Pain Score  7     Pain Location  Knee    Pain Orientation  Right    Pain Descriptors / Indicators  Sore    Pain Type  Surgical pain    Pain Onset  1 to 4 weeks ago    Pain Frequency  Constant    Aggravating Factors   standing and walking    Pain Relieving Factors  ice and elevation    Effect of Pain on Daily Activities   moderately affects                       [facility] Adult PT Treatment/Exercise - 05/30/18 0001      Knee/Hip Exercises: Standing   Heel Raises  15 reps;Limitations    Heel Raises Limitations  Toe raises 15x incline slope    Lateral Step Up  Right;15 reps;Hand Hold: 0;Step Height: 4"    Forward Step Up  15 reps;Hand Hold: 0;Step Height: 6"    Functional  Squat  10 reps;Limitations    Functional Squat Limitations  chair tapping    SLS with Vectors  5x 5" standing on Rt LE    Other Standing Knee Exercises  sidestep 2RT RTB      Knee/Hip Exercises: Seated   Sit to Sand  10 reps;without UE support      Manual Therapy   Manual Therapy  Edema management;Myofascial release    Manual therapy comments  completed separate rest of treatment    Edema Management  retro massage with BLE elevated    Myofascial Release  MFR to scar tissue to reduce adhesions               PT Short Term Goals - 05/17/18 1754      PT SHORT TERM GOAL #1   Title  Patient will report understanding and regular compliance of HEP to improve, ROM, strength, and overall functional mobility.     Status  Achieved      PT SHORT TERM GOAL #2   Title  Patient will demonstrate right knee AROM extension/flexion from 0-90 degrees to improve patient's ability to ambulate and ascend and descend stairs.     Baseline  7/10: 0-115deg    Status  Achieved      PT SHORT TERM GOAL #4   Title  Patient will demonstrate ability to maintain single limb stance on each lower extremity for 10 seconds indicating improved balance and safety.     Baseline  7/17: Lt and Rt 60" 1st attempt    Status  Achieved        PT Long Term Goals - 05/10/18 1646      PT LONG TERM GOAL #1   Title  Patient will demonstrate improvement of 1 MMT grade in all musculature tested as deficient at evaluation in order to assist patient with ambulation and with ascending and descending stairs.     Baseline  04/28/18: Patient demonstrated right lower extremity weakness compared to the left lower extremity.     Time  5    Period  Weeks    Status  On-going      PT LONG TERM GOAL #2   Title  Patient will demonstrate right knee AROM extension/flexion from 0-115 degrees to improve patient's ability to ambulate and ascend and descend stairs.     Baseline  7/10: 0-115    Time  5    Period  Weeks  Status   Achieved      PT LONG TERM GOAL #3   Title  Patient will demonstrate improvement of 5xSTS of 10 seconds and with lower extremities in line indicating improved balance and overall functional mobility.     Baseline  04/28/18: 26.06 seconds with the left lower extremity further back and upper extremities braced on legs    Time  5    Period  Weeks    Status  On-going      PT LONG TERM GOAL #4   Title  Patient will demonstrate ability to ascend and descend 4 6-inch stairs with single upper extremity support, facing forward, and with reciprocal gait pattern indicating improved functional strength and safety.     Baseline  continued difficulty with stairs    Time  5    Period  Weeks    Status  On-going      PT LONG TERM GOAL #5   Title  Patient will report ability to put on shoes and socks with no more than minimal difficulty.     Baseline  05/1018: Patient reported that she has moderate difficulty with putting on socks and shoes at this point. (was max)    Time  5    Period  Weeks    Status  On-going            Plan - 05/30/18 1817    Clinical Impression Statement  Continued session focus with functional strengthening.  Progressed gluteal strengthening with additional sidestep and squats and continued functional strengthening with stair training.  Pt able to complete all exercises with good form (min cueing to reduce ER wtih sidesteps).  Pt did c/o increased swelling behind lateral incision and stated a pull with step-ups.  EOS with manual techqniues to address scar tissue adhesions and edema control for pain and mobility.  Reports pain reduced to 3/10 and decreased pulling at EOS.      Rehab Potential  Good    Clinical Impairments Affecting Rehab Potential  Positive: Support system and motivated; Negative: history of knee problem    PT Frequency  3x / week    PT Duration  4 weeks Began 1 week following eval 05/05/18    PT Treatment/Interventions  ADLs/Self Care Home Management;Aquatic  Therapy;Cryotherapy;Electrical Stimulation;Iontophoresis 4mg /ml Dexamethasone;Moist Heat;DME Instruction;Gait training;Stair training;Functional mobility training;Therapeutic activities;Therapeutic exercise;Balance training;Neuromuscular re-education;Patient/family education;Orthotic Fit/Training;Manual techniques;Scar mobilization;Passive range of motion;Dry needling;Energy conservation;Splinting;Taping    PT Next Visit Plan  Begin step down training next session.  Continue to avoid high-impact exercises and manual techniques to address edema.      PT Home Exercise Plan  04/28/18: Quad sets 1 x 15 5'' holds, heel slides 1 x 15 5'' holds; ankle pumps 2 x 10 2x/day; standing hip abduction/extension on the right 1 x 15 1x/day       Patient will benefit from skilled therapeutic intervention in order to improve the following deficits and impairments:  Abnormal gait, Decreased balance, Decreased endurance, Decreased mobility, Difficulty walking, Hypomobility, Decreased range of motion, Increased edema, Improper body mechanics, Decreased activity tolerance, Decreased strength, Impaired flexibility, Pain  Visit Diagnosis: Acute pain of right knee  Stiffness of right knee, not elsewhere classified  Localized edema  Muscle weakness (generalized)  Other abnormalities of gait and mobility     Problem List Patient Active Problem List   Diagnosis Date Noted  . 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 S/P abdominal supracervical subtotal hysterectomy 06/21/2017  . Pelvic adhesions 06/09/2017  . History  of ovarian cyst 06/09/2017  . Right ovarian cyst 04/25/2017  . Encounter for gynecological examination with Papanicolaou smear of cervix 04/19/2017  . Rectal bleeding 04/02/2017  . LLQ pain 04/01/2017  . Constipation 04/01/2017  . Concussion with loss of consciousness 11/16/2016  . Anxiety 10/14/2016  . Chronic low back pain 05/29/2015  . Common migraine 12/19/2014  . Fibromyalgia 12/19/2014  .  Rebound headache 12/19/2014  . Depression 12/11/2014  . Body aches 12/11/2014   Becky Saxasey Hailynn Slovacek, LPTA; CBIS [phone]  Juel BurrowCockerham, Meridith Jo 05/30/2018, 6:23 PM  [facility] [address] , [place], [address] Phone: [phone]   Fax:  [phone]  Name: Mary Parker MRN: [id number] Date of Birth: 04-07-1981

## 2018-05-31 ENCOUNTER — Encounter (HOSPITAL_COMMUNITY): Payer: Self-pay | Admitting: Physical Therapy

## 2018-06-02 ENCOUNTER — Encounter (HOSPITAL_COMMUNITY): Payer: Self-pay | Admitting: Physical Therapy

## 2018-06-06 ENCOUNTER — Ambulatory Visit (HOSPITAL_COMMUNITY): Payer: Medicaid Other

## 2018-06-06 ENCOUNTER — Telehealth (HOSPITAL_COMMUNITY): Payer: Self-pay | Admitting: Internal Medicine

## 2018-06-06 NOTE — Telephone Encounter (Signed)
8/6  9:23 left a message asking if she can come in Wed at 4:45 with [facility].... MM

## 2018-06-07 ENCOUNTER — Ambulatory Visit (HOSPITAL_COMMUNITY): Payer: Medicaid Other | Attending: Orthopedic Surgery | Admitting: Physical Therapy

## 2018-06-07 ENCOUNTER — Encounter (HOSPITAL_COMMUNITY): Payer: Self-pay | Admitting: Physical Therapy

## 2018-06-07 DIAGNOSIS — R2689 Other abnormalities of gait and mobility: Secondary | ICD-10-CM | POA: Diagnosis present

## 2018-06-07 DIAGNOSIS — M25561 Pain in right knee: Secondary | ICD-10-CM

## 2018-06-07 DIAGNOSIS — M6281 Muscle weakness (generalized): Secondary | ICD-10-CM | POA: Diagnosis present

## 2018-06-07 DIAGNOSIS — M25661 Stiffness of right knee, not elsewhere classified: Secondary | ICD-10-CM | POA: Diagnosis present

## 2018-06-07 DIAGNOSIS — R600 Localized edema: Secondary | ICD-10-CM | POA: Diagnosis present

## 2018-06-07 NOTE — Therapy (Signed)
[facility] [address] [place], [place], [address] Phone: [phone]   Fax:  [phone]  Physical Therapy Treatment / Re-assessment / Discharge Summary  Patient Details  Name: Mary Parker MRN: [id number] Date of Birth: 1981-03-19 Referring Provider: Victorino December, MD   Encounter Date: 06/07/2018   Progress Note Reporting Period 04/28/18 to 06/07/18  See note below for Objective Data and Assessment of Progress/Goals.       PT End of Session - 06/07/18 1652    Visit Number  6    Number of Visits  13    Date for PT Re-Evaluation  06/02/18    Authorization Type  Medicaid    Authorization Time Period  Cert: 9/93/71 - 04/09/66 (1 week skipped for vacation); Medicaid reauthorization 12 sessions approved 7/16-->06/02/18    Authorization - Visit Number  4    Authorization - Number of Visits  12    PT Start Time  8938    PT Stop Time  1720    PT Time Calculation (min)  33 min    Equipment Utilized During Treatment  Gait belt    Activity Tolerance  Patient tolerated treatment well    Behavior During Therapy  WFL for tasks assessed/performed       Past Medical History:  Diagnosis Date  . Anxiety   . Arthritis    both knees  . Body aches 12/11/2014  . Chronic insomnia   . Chronic low back pain 05/29/2015  . Common migraine 12/19/2014  . Depression 12/11/2014  . Fibrocystic disease of both breasts   . Fibromyalgia 12/19/2014  . GERD (gastroesophageal reflux disease)   . History of hiatal hernia   . IBS (irritable bowel syndrome)   . Knee pain, right   . Migraines   . Rebound headache 12/19/2014  . Right ovarian cyst 04/25/2017   Complex, check CA 125 and repeat US in 6 weeks   . Sciatica of left side   . Seizures ([facility])    remote past, felt it was related to Chantix   . Vitamin D deficiency   . Wears glasses     Past Surgical History:  Procedure Laterality Date  . BILATERAL SALPINGECTOMY Bilateral 06/21/2017   Procedure:  BILATERAL SALPINGECTOMY;  Surgeon: Jonnie Kind, MD;  Location: [facility];  Service: Gynecology;  Laterality: Bilateral;  . CESAREAN SECTION  [phone]  . CHOLECYSTECTOMY  2001  . COLONOSCOPY WITH PROPOFOL N/A 04/07/2017   Procedure: COLONOSCOPY WITH PROPOFOL;  Surgeon: Daneil Dolin, MD;  Location: [facility];  Service: Endoscopy;  Laterality: N/A;  2:15pm  . ENDOMETRIAL ABLATION    . KNEE ARTHROSCOPY WITH LATERAL RELEASE Right 04/18/2018   Procedure: Right knee arthroscopy with chondroplasty and lateral release;  Surgeon: Nicholes Stairs, MD;  Location: [facility];  Service: Orthopedics;  Laterality: Right;  60 mins  . SUPRACERVICAL ABDOMINAL HYSTERECTOMY N/A 06/21/2017   Procedure: HYSTERECTOMY SUPRACERVICAL ABDOMINAL;  Surgeon: Jonnie Kind, MD;  Location: [facility];  Service: Gynecology;  Laterality: N/A;  . TUBAL LIGATION      There were no vitals filed for this visit.  Subjective Assessment - 06/07/18 1650    Subjective  Patient stated she is pleased with her progress and feels like she is able to do everything she wants to, she just stated she has some difficulty with going up and down stairs. She stated that her knee is throbbing some right now.    Pertinent History  S/P right knee arthroscopy with chondroplasty and lateral release on 04/18/18    How long can you sit comfortably?  Not limited    How long can you stand comfortably?  30 minutes    How long can you walk comfortably?  40 minutes    Patient Stated Goals  To get back to where she was, being able to do things with her kids    Currently in Pain?  Yes    Pain Score  6     Pain Location  Knee    Pain Orientation  Right    Pain Descriptors / Indicators  Throbbing    Pain Type  Surgical pain    Pain Onset  1 to 4 weeks ago         [facility] PT Assessment - 06/07/18 0001      Assessment   Medical Diagnosis  Right knee arthroscopy with chondroplasty and lateral release    Referring Provider   Victorino December, MD    Onset Date/Surgical Date  04/18/18    Next MD Visit  -- 2 months      Restrictions   Weight Bearing Restrictions  Yes    RLE Weight Bearing  Weight bearing as tolerated      [place] residence    Living Arrangements  Children;Parent    Type of [place] to enter    Entrance Stairs-Number of Steps  1    [place] to live on main level with bedroom/bathroom    Home Equipment  Crutches      Prior Function   Level of Independence  Independent;Independent with basic ADLs    Vocation  Full time employment    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    Leisure  Traveling      Cognition   Overall Cognitive Status  Within Functional Limits for tasks assessed      Circumferential Edema   Circumferential - Right  45 cm mid patella    Circumferential - Left   43 cm mid patella      AROM   Right Knee Extension  0    Right Knee Flexion  125    Left Knee Extension  0    Left Knee Flexion  135      Strength   Right Hip Flexion  5/5 was 4/5    Right Hip Extension  4+/5 was 4-/5    Right Hip ABduction  5/5 was 4/5    Left Hip Flexion  5/5    Left Hip Extension  5/5 was 4/5    Left Hip ABduction  5/5 was 4+/5    Right Knee Flexion  5/5 was 4/5    Right Knee Extension  5/5 was 4/5    Left Knee Flexion  5/5    Left Knee Extension  5/5    Right Ankle Dorsiflexion  5/5 was 4+/5    Left Ankle Dorsiflexion  5/5      Ambulation/Gait   Ambulation/Gait  Yes    Ambulation Distance (Feet)  470 Feet 2MWT    Gait Pattern  Within Functional Limits    Ambulation Surface  Level    Gait velocity  1.2 m/s    Stairs  Yes    Stairs Assistance  6: Modified independent (Device/Increase time)    Number of Stairs  4    Height of Stairs  6 inches    Gait Comments  Ascending and descending stairs with reciprocal pattern       Static Standing Balance   Static Standing - Balance Support  No upper extremity  supported    Static Standing Balance -  Activities   Single Leg Stance - Right Leg;Single Leg Stance - Left Leg    Static Standing - Comment/# of Minutes  Both >1 minute      Standardized Balance Assessment   Five times sit to stand comments   13.83 seconds from standard height chair without upper extremities                           PT Education - 06/07/18 1736    Education Details  Discussed re-examination and plans for discharge, as well as demonstrated massage technique to decrease swelling and updated HEP.     Person(s) Educated  Patient    Methods  Explanation;Handout;Demonstration    Comprehension  Verbalized understanding;Returned demonstration       PT Short Term Goals - 06/07/18 1732      PT SHORT TERM GOAL #1   Title  Patient will report understanding and regular compliance of HEP to improve, ROM, strength, and overall functional mobility.     Baseline  06/07/18: Patient reported regular compliance with HEP.     Status  Achieved      PT SHORT TERM GOAL #2   Title  Patient will demonstrate right knee AROM extension/flexion from 0-90 degrees to improve patient's ability to ambulate and ascend and descend stairs.     Baseline  7/10: 0-115deg    Status  Achieved      PT SHORT TERM GOAL #3   Title  Patient will demonstrate improvement of 1/2 MMT grade in all musculature tested as deficient at evaluation in order to assist patient with ambulation and with ascending and descending stairs.     Baseline  06/07/18: Achieved. See MMT.     Status  Achieved      PT SHORT TERM GOAL #4   Title  Patient will demonstrate ability to maintain single limb stance on each lower extremity for 10 seconds indicating improved balance and safety.     Baseline  7/17: Lt and Rt 60" 1st attempt    Status  Achieved        PT Long Term Goals - 06/07/18 1733      PT LONG TERM GOAL #1   Title  Patient will demonstrate improvement of 1 MMT grade in all musculature tested as  deficient at evaluation in order to assist patient with ambulation and with ascending and descending stairs.     Baseline  06/07/18: Achieved see MMT.     Time  5    Period  Weeks    Status  Achieved      PT LONG TERM GOAL #2   Title  Patient will demonstrate right knee AROM extension/flexion from 0-115 degrees to improve patient's ability to ambulate and ascend and descend stairs.     Baseline  7/10: 0-115    Time  5    Period  Weeks    Status  Achieved      PT LONG TERM GOAL #3   Title  Patient will demonstrate improvement of 5xSTS of 10 seconds and with lower extremities in line indicating improved balance and overall functional mobility.     Baseline  06/07/18: Performed in 13.83 seconds.  Time  5    Period  Weeks    Status  Achieved      PT LONG TERM GOAL #4   Title  Patient will demonstrate ability to ascend and descend 4 6-inch stairs with single upper extremity support, facing forward, and with reciprocal gait pattern indicating improved functional strength and safety.     Baseline  06/07/18: Patient demonstrated ability to perform stairs with reciprocal gait pattern and single upper extremity assistance.     Time  5    Period  Weeks    Status  Achieved      PT LONG TERM GOAL #5   Title  Patient will report ability to put on shoes and socks with no more than minimal difficulty.     Baseline  06/07/18: Patient reported not much difficulty with putting on socks.     Time  5    Period  Weeks    Status  Achieved            Plan - 06/07/18 1740    Clinical Impression Statement  This session performed a re-assessment of patient's progress towards goals. Patient had achieved all short term and long term goals. Patient has demonstrated improvements in strength, ROM and functional mobility. Patient still has swelling in her right knee and reported some difficulty with ascending and descending a lot of stairs, however she stated that she is pleased with her progress and how much  she is able to do currently. Patient was educated on updated HEP to practice stairs as well as massage technique to decrease swelling and reminded patient of performing ankle pumps and quads sets to decrease swelling. Patient was also informed that if she has any questions or concerns in the future to call the clinic. Patient is being discharged at this time as she has met all of her goals and is pleased with her current functional status.     Rehab Potential  Good    Clinical Impairments Affecting Rehab Potential  Positive: Support system and motivated; Negative: history of knee problem    PT Frequency  3x / week    PT Duration  4 weeks Began 1 week following eval 05/05/18    PT Treatment/Interventions  ADLs/Self Care Home Management;Aquatic Therapy;Cryotherapy;Electrical Stimulation;Iontophoresis 25m/ml Dexamethasone;Moist Heat;DME Instruction;Gait training;Stair training;Functional mobility training;Therapeutic activities;Therapeutic exercise;Balance training;Neuromuscular re-education;Patient/family education;Orthotic Fit/Training;Manual techniques;Scar mobilization;Passive range of motion;Dry needling;Energy conservation;Splinting;Taping    PT Next Visit Plan  Discharged    PT Home Exercise Plan  04/28/18: Quad sets 1 x 15 5'' holds, heel slides 1 x 15 5'' holds; ankle pumps 2 x 10 2x/day; standing hip abduction/extension on the right 1 x 15 1x/day. 06/07/18: Step ups 1x10 and step downs 1x10 1x/day    Consulted and Agree with Plan of Care  Patient       Patient will benefit from skilled therapeutic intervention in order to improve the following deficits and impairments:  Abnormal gait, Decreased balance, Decreased endurance, Decreased mobility, Difficulty walking, Hypomobility, Decreased range of motion, Increased edema, Improper body mechanics, Decreased activity tolerance, Decreased strength, Impaired flexibility, Pain  Visit Diagnosis: Acute pain of right knee  Stiffness of right knee, not  elsewhere classified  Localized edema  Muscle weakness (generalized)  Other abnormalities of gait and mobility     Problem List Patient Active Problem List   Diagnosis Date Noted  . 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 S/P abdominal supracervical subtotal hysterectomy 06/21/2017  . Pelvic adhesions 06/09/2017  .  History of ovarian cyst 06/09/2017  . Right ovarian cyst 04/25/2017  . Encounter for gynecological examination with Papanicolaou smear of cervix 04/19/2017  . Rectal bleeding 04/02/2017  . LLQ pain 04/01/2017  . Constipation 04/01/2017  . Concussion with loss of consciousness 11/16/2016  . Anxiety 10/14/2016  . Chronic low back pain 05/29/2015  . Common migraine 12/19/2014  . Fibromyalgia 12/19/2014  . Rebound headache 12/19/2014  . Depression 12/11/2014  . Body aches 12/11/2014   PHYSICAL THERAPY DISCHARGE SUMMARY  Visits from Start of Care: 6  Current functional level related to goals / functional outcomes: See above   Remaining deficits: See above   Education / Equipment: See above Plan: Patient agrees to discharge.  Patient goals were met. Patient is being discharged due to being pleased with the current functional level.  ?????            Clarene Critchley PT, DPT 5:45 PM, 06/07/18 [place], [place], [address] Phone: [phone]   Fax:  [phone]  Name: Mary Parker MRN: [id number] Date of Birth: July 24, 1981

## 2018-06-08 ENCOUNTER — Ambulatory Visit (HOSPITAL_COMMUNITY): Payer: Medicaid Other

## 2018-06-13 ENCOUNTER — Other Ambulatory Visit: Payer: Self-pay | Admitting: Neurology

## 2018-06-13 MED ORDER — DEXAMETHASONE 2 MG PO TABS: ORAL_TABLET | ORAL | 0 refills | Status: DC

## 2018-06-13 MED ORDER — TAPENTADOL HCL 50 MG PO TABS: 50.0000 mg | ORAL_TABLET | Freq: Four times a day (QID) | ORAL | 0 refills | Status: DC | PRN

## 2018-07-05 ENCOUNTER — Encounter (HOSPITAL_COMMUNITY): Payer: Self-pay | Admitting: Emergency Medicine

## 2018-07-05 ENCOUNTER — Other Ambulatory Visit: Payer: Self-pay

## 2018-07-05 ENCOUNTER — Emergency Department (HOSPITAL_COMMUNITY)
Admission: EM | Admit: 2018-07-05 | Discharge: 2018-07-05 | Disposition: A | Discharge status: Home / Self Care | Payer: Medicaid Other | Attending: Emergency Medicine | Admitting: Emergency Medicine

## 2018-07-05 DIAGNOSIS — Z87891 Personal history of nicotine dependence: Secondary | ICD-10-CM | POA: Diagnosis not present

## 2018-07-05 DIAGNOSIS — Z79899 Other long term (current) drug therapy: Secondary | ICD-10-CM | POA: Insufficient documentation

## 2018-07-05 DIAGNOSIS — G43909 Migraine, unspecified, not intractable, without status migrainosus: Secondary | ICD-10-CM | POA: Diagnosis present

## 2018-07-05 DIAGNOSIS — G43801 Other migraine, not intractable, with status migrainosus: Secondary | ICD-10-CM | POA: Insufficient documentation

## 2018-07-05 LAB — BASIC METABOLIC PANEL
ANION GAP: 8 (ref 5–15)
BUN: 8 mg/dL (ref 6–20)
CHLORIDE: 99 mmol/L (ref 98–111)
CO2: 30 mmol/L (ref 22–32)
Calcium: 9.3 mg/dL (ref 8.9–10.3)
Creatinine, Ser: 0.82 mg/dL (ref 0.44–1.00)
GFR calc Af Amer: >60 mL/min (ref >60)
Glucose, Bld: 83 mg/dL (ref 70–99)
Potassium: 3.8 mmol/L (ref 3.5–5.1)
SODIUM: 137 mmol/L (ref 135–145)

## 2018-07-05 LAB — URINALYSIS, ROUTINE W REFLEX MICROSCOPIC
Bilirubin Urine: NEGATIVE
Glucose, UA: NEGATIVE mg/dL
Hgb urine dipstick: NEGATIVE
Ketones, ur: NEGATIVE mg/dL
LEUKOCYTES UA: NEGATIVE
Nitrite: NEGATIVE
PROTEIN: NEGATIVE mg/dL
SPECIFIC GRAVITY, URINE: 1.012 (ref 1.005–1.030)
pH: 5 (ref 5.0–8.0)

## 2018-07-05 LAB — CBC
HCT: 40.2 % (ref 36.0–46.0)
HEMOGLOBIN: 12.6 g/dL (ref 12.0–15.0)
MCH: 29.8 pg (ref 26.0–34.0)
MCHC: 31.3 g/dL (ref 30.0–36.0)
MCV: 95 fL (ref 78.0–100.0)
PLATELETS: 177 10*3/uL (ref 150–400)
RBC: 4.23 MIL/uL (ref 3.87–5.11)
RDW: 13.9 % (ref 11.5–15.5)
WBC: 5.4 10*3/uL (ref 4.0–10.5)

## 2018-07-05 LAB — PREGNANCY, URINE: PREG TEST UR: NEGATIVE

## 2018-07-05 MED ORDER — SODIUM CHLORIDE 0.9 % IV BOLUS
1000.0000 mL | Freq: Once | INTRAVENOUS | Status: AC
  Administered 2018-07-05: 1000 mL via INTRAVENOUS

## 2018-07-05 MED ORDER — PROCHLORPERAZINE EDISYLATE 10 MG/2ML IJ SOLN
10.0000 mg | Freq: Once | INTRAMUSCULAR | Status: AC
  Administered 2018-07-05: 10 mg via INTRAVENOUS
  Filled 2018-07-05: qty 2

## 2018-07-05 MED ORDER — KETOROLAC TROMETHAMINE 30 MG/ML IJ SOLN
30.0000 mg | Freq: Once | INTRAMUSCULAR | Status: AC
  Administered 2018-07-05: 30 mg via INTRAVENOUS
  Filled 2018-07-05: qty 1

## 2018-07-05 NOTE — Discharge Instructions (Signed)
Take over-the-counter medications as needed, follow-up with your primary care doctor if symptoms have not resolved in the next couple of days, return as needed for worsening symptoms

## 2018-07-05 NOTE — ED Triage Notes (Signed)
Pt reports migraine HA, dry mouth, and N/. No OTC or Rx medications.

## 2018-07-05 NOTE — ED Provider Notes (Signed)
[facility] EMERGENCY DEPARTMENT Provider Note   CSN: [id number] Arrival date & time: 07/05/18  1750     History   Chief Complaint Chief Complaint  Patient presents with  . Migraine    HPI Mary Parker is a 37 y.o. female.  HPI She states of the last several days she has had some trouble with sinus congestion, malaise and headache.  She went to see her primary care doctor and was started on antibiotics yesterday.  She continues to have sinus pressure but  states today she feels like she is developed a migraine.  She has a headache dry mouth and nausea.  He has photophobia and phonophobia.  She denies any vomiting.  She had fevers over the weekend but none today.  She denies any neck pain or neck stiffness.  She denies any rashes.  She also had a sharp pain in lower abdomen earlier today.  She is concerned about a possible kidney stone.  She did not notice any blood in her urine.  She denies any vaginal discharge or bleeding.   Past Medical History:  Diagnosis Date  . Anxiety   . Arthritis    both knees  . Body aches 12/11/2014  . Chronic insomnia   . Chronic low back pain 05/29/2015  . Common migraine 12/19/2014  . Depression 12/11/2014  . Fibrocystic disease of both breasts   . Fibromyalgia 12/19/2014  . GERD (gastroesophageal reflux disease)   . History of hiatal hernia   . IBS (irritable bowel syndrome)   . Knee pain, right   . Migraines   . Rebound headache 12/19/2014  . Right ovarian cyst 04/25/2017   Complex, check CA 125 and repeat US in 6 weeks   . Sciatica of left side   . Seizures ([facility])    remote past, felt it was related to Chantix   . Vitamin D deficiency   . Wears glasses     Patient Active Problem List   Diagnosis Date Noted  . 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 S/P abdominal supracervical subtotal hysterectomy 06/21/2017  . Pelvic adhesions 06/09/2017  . History of ovarian cyst 06/09/2017  . Right ovarian cyst 04/25/2017  . Encounter for  gynecological examination with Papanicolaou smear of cervix 04/19/2017  . Rectal bleeding 04/02/2017  . LLQ pain 04/01/2017  . Constipation 04/01/2017  . Concussion with loss of consciousness 11/16/2016  . Anxiety 10/14/2016  . Chronic low back pain 05/29/2015  . Common migraine 12/19/2014  . Fibromyalgia 12/19/2014  . Rebound headache 12/19/2014  . Depression 12/11/2014  . Body aches 12/11/2014    Past Surgical History:  Procedure Laterality Date  . BILATERAL SALPINGECTOMY Bilateral 06/21/2017   Procedure: BILATERAL SALPINGECTOMY;  Surgeon: Tilda Burrow, MD;  Location: [facility];  Service: Gynecology;  Laterality: Bilateral;  . CESAREAN SECTION  [phone]  . CHOLECYSTECTOMY  2001  . COLONOSCOPY WITH PROPOFOL N/A 04/07/2017   Procedure: COLONOSCOPY WITH PROPOFOL;  Surgeon: Corbin Ade, MD;  Location: [facility];  Service: Endoscopy;  Laterality: N/A;  2:15pm  . ENDOMETRIAL ABLATION    . KNEE ARTHROSCOPY WITH LATERAL RELEASE Right 04/18/2018   Procedure: Right knee arthroscopy with chondroplasty and lateral release;  Surgeon: Yolonda Kida, MD;  Location: [facility];  Service: Orthopedics;  Laterality: Right;  60 mins  . SUPRACERVICAL ABDOMINAL HYSTERECTOMY N/A 06/21/2017   Procedure: HYSTERECTOMY SUPRACERVICAL ABDOMINAL;  Surgeon: Tilda Burrow, MD;  Location: [facility];  Service: Gynecology;  Laterality: N/A;  . TUBAL LIGATION       OB History    Gravida  3   Para  3   Term  3   Preterm      AB      Living  3     SAB      TAB      Ectopic      Multiple      Live Births  3            Home Medications    Prior to Admission medications   Medication Sig Start Date End Date Taking? Authorizing Provider  albuterol (PROVENTIL) (2.5 MG/3ML) 0.083% nebulizer solution VVN Q 6 H PRN 02/21/18   [provider]  ALPRAZolam Prudy Feeler) 0.5 MG tablet Take 1 tablet (0.5 mg total) by mouth 2 (two) times daily as needed for anxiety.  10/14/16   Adline Potter, NP  Butalbital-APAP-Caffeine [phone] MG capsule TK 1 C PO Q 6 H PRF 30 DAYS 02/21/18   [provider]  cetirizine HCl (ZYRTEC) 1 MG/ML solution Take 10 mg by mouth at bedtime.    [provider]  cyanocobalamin (,VITAMIN B-12,) 1000 MCG/ML injection INJECT 1 ML INTO THE SKIN ONCE A MONTH. 02/17/18   [provider]  dexamethasone (DECADRON) 2 MG tablet Take 3 tablets the first day, 2 the second and 1 the third day 06/13/18   York Spaniel, MD  escitalopram (LEXAPRO) 20 MG tablet Take 20 mg by mouth daily. 10/31/17   [provider]  levocetirizine (XYZAL) 5 MG tablet TK 1 T PO QHS 02/01/18   [provider]  omeprazole (PRILOSEC OTC) 20 MG tablet Take 20 mg by mouth daily.    [provider]  ondansetron (ZOFRAN-ODT) 4 MG disintegrating tablet DISSOLVE 1 TABLET(4 MG) ON THE TONGUE EVERY 8 HOURS AS NEEDED FOR NAUSEA OR VOMITING 06/13/18   York Spaniel, MD  propranolol (INDERAL) 20 MG tablet Take 1 tablet (20 mg total) by mouth 2 (two) times daily. Patient not taking: Reported on 04/28/2018 03/16/18   York Spaniel, MD  rizatriptan (MAXALT-MLT) 10 MG disintegrating tablet DISSOLVE 1 TABLET ON THE TONGUE THREE TIMES DAILY AS NEEDED FOR MIGRAINE 03/09/18   York Spaniel, MD  tapentadol (NUCYNTA) 50 MG tablet Take 1 tablet (50 mg total) by mouth every 6 (six) hours as needed. Must last 28 days 06/13/18   York Spaniel, MD  tiZANidine (ZANAFLEX) 2 MG tablet TAKE 1 TABLET BY MOUTH TWICE DAILY, THEN TAKE 2 TABLETS EVERY EVENING Patient taking differently: TAKE 1 TABLET BY MOUTH TWICE DAILY, THEN TAKE 2 TABLETS EVERY EVENING as needed 08/18/17   York Spaniel, MD  traZODone (DESYREL) 100 MG tablet TAKE 1 TABLET(100 MG) BY MOUTH AT BEDTIME 05/23/18   York Spaniel, MD  traZODone (DESYREL) 50 MG tablet Take 50 mg by mouth at bedtime.    [provider]  varenicline (CHANTIX) 1 MG tablet Take 1 mg  by mouth daily.     [provider]  Vitamin D, Ergocalciferol, (DRISDOL) 50000 units CAPS capsule TK 1 C PO 1 TIME A WK FOR 12 WKS 02/18/18   [provider]    Family History Family History  Problem Relation Age of Onset  . Hypertension Mother   . Other Mother        abnormal cells; had hyst  . Obesity Daughter   . Diabetes Daughter 47  youngest  . Obesity Son   . Cancer Maternal Grandmother   . Osteoporosis Maternal Grandmother   . Cancer Maternal Grandfather   . Obesity Daughter   . Diabetes Father   . Hypertension Father   . Alcohol abuse Father   . Colon cancer Neg Hx     Social History Social History   Tobacco Use  . Smoking status: Former Smoker    Packs/day: 0.25    Years: 25.00    Pack years: 6.25    Types: Cigarettes  . Smokeless tobacco: Never Used  . Tobacco comment: pt down to 3 cigarettes/d  Substance Use Topics  . Alcohol use: No  . Drug use: No     Allergies   Wellbutrin [bupropion]; Ciprofloxacin; Cymbalta [duloxetine hcl]; Meloxicam; Savella [milnacipran]; Topamax [topiramate]; Hydrocodone-acetaminophen; Pristiq [desvenlafaxine succinate er]; Doxycycline; Gabapentin; Lyrica [pregabalin]; Nortriptyline; and Percocet [oxycodone-acetaminophen]   Review of Systems Review of Systems  Constitutional: Positive for fever.  HENT: Positive for sinus pain. Negative for drooling, postnasal drip and rhinorrhea.        Patient feels like her tongue is swelling  Gastrointestinal: Positive for abdominal pain. Negative for abdominal distention.  Genitourinary: Negative for dysuria.  Musculoskeletal: Negative for back pain.  All other systems reviewed and are negative.    Physical Exam Updated Vital Signs BP 100/75 (BP Location: Right Arm)   Pulse 81   Temp 97.8 F (36.6 C) (Oral)   Resp 16   Ht 1.575 m (5\' 2" )   Wt 95.3 kg   SpO2 100%   BMI 38.41 kg/m   Physical Exam  Constitutional: No distress.  HENT:  Head:  Normocephalic and atraumatic.  Right Ear: External ear normal.  Left Ear: External ear normal.  Mouth/Throat: No oropharyngeal exudate.  No swelling noted in the oropharynx, no sinus tenderness  Eyes: Conjunctivae are normal. Right eye exhibits no discharge. Left eye exhibits no discharge. No scleral icterus.  Neck: Normal range of motion. Neck supple. No tracheal deviation present.  Cardiovascular: Normal rate, regular rhythm and intact distal pulses.  Pulmonary/Chest: Effort normal and breath sounds normal. No stridor. No respiratory distress. She has no wheezes. She has no rales.  Abdominal: Soft. Bowel sounds are normal. She exhibits no distension. There is no tenderness. There is no rebound and no guarding.  Musculoskeletal: She exhibits no edema or tenderness.  Neurological: She is alert. She has normal strength. No cranial nerve deficit (no facial droop, extraocular movements intact, no slurred speech) or sensory deficit. She exhibits normal muscle tone. She displays no seizure activity. Coordination normal.  Skin: Skin is warm and dry. No rash noted. She is not diaphoretic.  Psychiatric: She has a normal mood and affect.  Nursing note and vitals reviewed.    ED Treatments / Results  Labs (all labs ordered are listed, but only abnormal results are displayed) Labs Reviewed  CBC  BASIC METABOLIC PANEL  URINALYSIS, ROUTINE W REFLEX MICROSCOPIC  PREGNANCY, URINE    EKG None  Radiology No results found.  Procedures Procedures (including critical care time)  Medications Ordered in ED Medications  prochlorperazine (COMPAZINE) injection 10 mg (10 mg Intravenous Given 07/05/18 2038)  ketorolac (TORADOL) 30 MG/ML injection 30 mg (30 mg Intravenous Given 07/05/18 2038)  sodium chloride 0.9 % bolus 1,000 mL (0 mLs Intravenous Stopped 07/05/18 2156)     Initial Impression / Assessment and Plan / ED Course  I have reviewed the triage vital signs and the nursing notes.  Pertinent  labs &  imaging results that were available during my care of the patient were reviewed by me and considered in my medical decision making (see chart for details).   Patient presented to the emergency room with complaints of headache as well as episode of sharp pain in her abdomen.  Patient's exam is reassuring.  No meningismus on exam.  Abdominal exam is benign.  Laboratory tests are normal.  I doubt renal colic.  I doubt any serious bacterial infection.  Patient was treated with a migraine cocktail.  Her symptoms improved significantly.  She was Feeling much better and ready for discharge  Final Clinical Impressions(s) / ED Diagnoses   Final diagnoses:  Other migraine with status migrainosus, not intractable    ED Discharge Orders    None       Linwood Dibbles, MD 07/05/18 2204

## 2018-07-19 ENCOUNTER — Other Ambulatory Visit: Payer: Self-pay

## 2018-07-19 MED ORDER — TAPENTADOL HCL 50 MG PO TABS: 50.0000 mg | ORAL_TABLET | Freq: Four times a day (QID) | ORAL | 0 refills | Status: DC | PRN

## 2018-07-23 ENCOUNTER — Other Ambulatory Visit: Payer: Self-pay | Admitting: Neurology

## 2018-08-08 ENCOUNTER — Other Ambulatory Visit: Payer: Self-pay

## 2018-08-08 ENCOUNTER — Encounter: Payer: Self-pay | Admitting: Neurology

## 2018-08-08 ENCOUNTER — Ambulatory Visit (INDEPENDENT_AMBULATORY_CARE_PROVIDER_SITE_OTHER): Payer: Medicaid Other | Admitting: Neurology

## 2018-08-08 VITALS — BP 124/86 | HR 82 | Resp 18 | Ht 62.0 in | Wt 214.0 lb

## 2018-08-08 DIAGNOSIS — G43019 Migraine without aura, intractable, without status migrainosus: Secondary | ICD-10-CM

## 2018-08-08 DIAGNOSIS — M797 Fibromyalgia: Secondary | ICD-10-CM | POA: Diagnosis not present

## 2018-08-08 MED ORDER — ERENUMAB-AOOE 140 MG/ML ~~LOC~~ SOAJ: 140.0000 mg | SUBCUTANEOUS | 5 refills | Status: DC

## 2018-08-08 NOTE — Progress Notes (Signed)
Reason for visit: Migraine headache  Mary Parker is an 37 y.o. female  History of present illness:  Mary Parker is a 37 year old right-handed white female with a history of fibromyalgia and a history of migraine headache.  The patient has a nonorganic left hemisensory deficit.  The patient comes in today indicating that she did not tolerate the propranolol for her headache.  The medication made the headache worse.  She did have one injection of Ajovy through her primary care physician, she did not have any headache for 1 month.  The patient still has some left SI joint problems, she never was seen through the [facility].  The patient does have some trigger point areas of pain at the base of the neck between his shoulder blades.  She comes in today for an evaluation.  Apparently Ajovy was not covered by her insurance.  Past Medical History:  Diagnosis Date  . Anxiety   . Arthritis    both knees  . Body aches 12/11/2014  . Chronic insomnia   . Chronic low back pain 05/29/2015  . Common migraine 12/19/2014  . Depression 12/11/2014  . Fibrocystic disease of both breasts   . Fibromyalgia 12/19/2014  . GERD (gastroesophageal reflux disease)   . History of hiatal hernia   . IBS (irritable bowel syndrome)   . Knee pain, right   . Migraines   . Rebound headache 12/19/2014  . Right ovarian cyst 04/25/2017   Complex, check CA 125 and repeat US in 6 weeks   . Sciatica of left side   . Seizures (HCC)    remote past, felt it was related to Chantix   . Vitamin D deficiency   . Wears glasses     Past Surgical History:  Procedure Laterality Date  . BILATERAL SALPINGECTOMY Bilateral 06/21/2017   Procedure: BILATERAL SALPINGECTOMY;  Surgeon: Tilda Burrow, MD;  Location: [facility];  Service: Gynecology;  Laterality: Bilateral;  . CESAREAN SECTION  [phone]  . CHOLECYSTECTOMY  2001  . COLONOSCOPY WITH PROPOFOL N/A 04/07/2017   Procedure: COLONOSCOPY WITH PROPOFOL;  Surgeon:  Corbin Ade, MD;  Location: [facility];  Service: Endoscopy;  Laterality: N/A;  2:15pm  . ENDOMETRIAL ABLATION    . KNEE ARTHROSCOPY WITH LATERAL RELEASE Right 04/18/2018   Procedure: Right knee arthroscopy with chondroplasty and lateral release;  Surgeon: Yolonda Kida, MD;  Location: [facility];  Service: Orthopedics;  Laterality: Right;  60 mins  . SUPRACERVICAL ABDOMINAL HYSTERECTOMY N/A 06/21/2017   Procedure: HYSTERECTOMY SUPRACERVICAL ABDOMINAL;  Surgeon: Tilda Burrow, MD;  Location: [facility];  Service: Gynecology;  Laterality: N/A;  . TUBAL LIGATION      Family History  Problem Relation Age of Onset  . Hypertension Mother   . Other Mother        abnormal cells; had hyst  . Obesity Daughter   . Diabetes Daughter 59       youngest  . Obesity Son   . Cancer Maternal Grandmother   . Osteoporosis Maternal Grandmother   . Cancer Maternal Grandfather   . Obesity Daughter   . Diabetes Father   . Hypertension Father   . Alcohol abuse Father   . Colon cancer Neg Hx     Social history:  reports that she has quit smoking. Her smoking use included cigarettes. She has a 6.25 pack-year smoking history. She has never used smokeless tobacco. She reports that she does not drink alcohol or  use drugs.    Allergies  Allergen Reactions  . Wellbutrin [Bupropion] Other (See Comments)    Suicidal ideation  . Ciprofloxacin Hives, Swelling and Other (See Comments)    Sweating, dizziness  . Cymbalta [Duloxetine Hcl] Hives, Itching and Rash  . Meloxicam Other (See Comments)    Abdominal cramping and mood changes   . Savella [Milnacipran] Other (See Comments)    Dizziness and nausea, leg cramps  . Topamax [Topiramate] Swelling and Other (See Comments)    Dizziness and slurred speech  . Hydrocodone-Acetaminophen Swelling  . Pristiq [Desvenlafaxine Succinate Er]     UTI  . Doxycycline Nausea Only  . Gabapentin Itching  . Lyrica [Pregabalin] Other (See  Comments)    Tremors   . Nortriptyline Other (See Comments)    GERD  . Percocet [Oxycodone-Acetaminophen] Nausea Only    Medications:  Prior to Admission medications   Medication Sig Start Date End Date Taking? Authorizing Provider  albuterol (PROVENTIL) (2.5 MG/3ML) 0.083% nebulizer solution VVN Q 6 H PRN 02/21/18  Yes [provider]  ALPRAZolam (XANAX) 0.5 MG tablet Take 1 tablet (0.5 mg total) by mouth 2 (two) times daily as needed for anxiety. 10/14/16  Yes Cyril Mourning A, NP  Butalbital-APAP-Caffeine 50-325-40 MG capsule TK 1 C PO Q 6 H PRF 30 DAYS 02/21/18  Yes [provider]  cyanocobalamin (,VITAMIN B-12,) 1000 MCG/ML injection INJECT 1 ML INTO THE SKIN ONCE A MONTH. 02/17/18  Yes [provider]  escitalopram (LEXAPRO) 20 MG tablet Take 20 mg by mouth daily. 10/31/17  Yes [provider]  levocetirizine (XYZAL) 5 MG tablet TK 1 T PO QHS 02/01/18  Yes [provider]  omeprazole (PRILOSEC OTC) 20 MG tablet Take 20 mg by mouth daily.   Yes [provider]  ondansetron (ZOFRAN-ODT) 4 MG disintegrating tablet DISSOLVE 1 TABLET(4 MG) ON THE TONGUE EVERY 8 HOURS AS NEEDED FOR NAUSEA OR VOMITING 06/13/18  Yes York Spaniel, MD  rizatriptan (MAXALT-MLT) 10 MG disintegrating tablet DISSOLVE 1 TABLET ON THE TONGUE THREE TIMES DAILY AS NEEDED FOR MIGRAINE 07/24/18  Yes York Spaniel, MD  tapentadol (NUCYNTA) 50 MG tablet Take 1 tablet (50 mg total) by mouth every 6 (six) hours as needed. Must last 28 days 07/19/18  Yes York Spaniel, MD  tiZANidine (ZANAFLEX) 2 MG tablet TAKE 1 TABLET BY MOUTH TWICE DAILY, THEN TAKE 2 TABLETS EVERY EVENING Patient taking differently: TAKE 1 TABLET BY MOUTH TWICE DAILY, THEN TAKE 2 TABLETS EVERY EVENING as needed 08/18/17  Yes York Spaniel, MD  traZODone (DESYREL) 100 MG tablet TAKE 1 TABLET(100 MG) BY MOUTH AT BEDTIME 05/23/18  Yes York Spaniel, MD  traZODone (DESYREL) 50 MG tablet Take  50 mg by mouth at bedtime.   Yes [provider]  varenicline (CHANTIX) 1 MG tablet Take 1 mg by mouth daily.    Yes [provider]  cetirizine HCl (ZYRTEC) 1 MG/ML solution Take 10 mg by mouth at bedtime.    [provider]  dexamethasone (DECADRON) 2 MG tablet Take 3 tablets the first day, 2 the second and 1 the third day Patient not taking: Reported on 08/08/2018 06/13/18   York Spaniel, MD  propranolol (INDERAL) 20 MG tablet Take 1 tablet (20 mg total) by mouth 2 (two) times daily. Patient not taking: Reported on 04/28/2018 03/16/18   York Spaniel, MD  Vitamin D, Ergocalciferol, (DRISDOL) 50000 units CAPS capsule TK 1 C PO 1 TIME A  WK FOR 12 WKS 02/18/18   [provider]    ROS:  Out of a complete 14 system review of symptoms, the patient complains only of the following symptoms, and all other reviewed systems are negative.  Cough, shortness of breath Abdominal pain Frequency of urination Snoring Joint pain, back pain, aching muscles, neck pain, neck stiffness Headache, weakness, fatigue Depression, anxiety  Blood pressure 124/86, pulse 82, resp. rate 18, height 5\' 2"  (1.575 m), weight 214 lb (97.1 kg).  Physical Exam  General: The patient is alert and cooperative at the time of the examination.  The patient is moderately to markedly obese.  Neuromuscular: Range move the cervical spine is full.  Skin: No significant peripheral edema is noted.   Neurologic Exam  Mental status: The patient is alert and oriented x 3 at the time of the examination. The patient has apparent normal recent and remote memory, with an apparently normal attention span and concentration ability.   Cranial nerves: Facial symmetry is present. Speech is normal, no aphasia or dysarthria is noted. Extraocular movements are full. Visual fields are full.  Motor: The patient has good strength in all 4 extremities.  Sensory examination: Soft touch sensation is  decreased on the left face, arm, and leg.  Coordination: The patient has good finger-nose-finger and heel-to-shin bilaterally.  Gait and station: The patient has a normal gait. Tandem gait is normal. Romberg is negative. No drift is seen.  Reflexes: Deep tendon reflexes are symmetric.   Assessment/Plan:  1.  Migraine headache  2.  Fibromyalgia   3.  Left SI joint dysfunction  The patient is having 2-3 headaches a week currently, she indicates that after the injection of Ajovy that the headaches went away for a month.  She has been tried on a multitude of medications including propranolol, Lyrica, gabapentin, Cymbalta, Topamax, tizanidine, and nortriptyline without benefit.  We will try Aimovig for her migraine headache.  The patient will follow-up in 6 months.  She will try to make an appointment for the [facility].  Marlan Palau MD 08/08/2018 3:08 PM  [facility] [address] [place], [place] [address]  Phone [phone] Fax [phone]

## 2018-08-08 NOTE — Procedures (Signed)
     History: Mary Parker is a 37 year old patient with a history of fibromyalgia with trigger points in the back and neck area.  The patient comes in with soreness and trigger points in the interscapular area bilaterally.  She is getting therapy for this today.  Procedure: Bupivacaine 0.5% solution was used on trigger points 1 [address] between the spine and the medial scapula bilaterally, 2 on each side that were 4 cm apart vertically.  1 cc of bupivacaine was injected at each site.  The patient tolerated the procedure well, no complications were noted.  Sensorcaine was used, 0.5%, [place] 63[address]- [phone]- [phone]  Lot #[id number]  Expiration date September 2022.

## 2018-08-08 NOTE — Patient Instructions (Signed)
We will try Aimovig for the headache. 

## 2018-08-21 MED ORDER — TAPENTADOL HCL 50 MG PO TABS: 50.0000 mg | ORAL_TABLET | Freq: Four times a day (QID) | ORAL | 0 refills | Status: DC | PRN

## 2018-08-23 ENCOUNTER — Telehealth: Payer: Self-pay | Admitting: *Deleted

## 2018-08-23 NOTE — Telephone Encounter (Signed)
PA for Nucynta 50mg  completed and faxed to [facility], fax# [phone]/fim

## 2018-08-30 NOTE — Telephone Encounter (Signed)
I called [place] [facility] to check status of PA. Interaction ID: [id number]. Per [place] [facility], they did not receive the PA that was faxed on 10/23 (fax confirmation was received that date). I have refaxed PA, along with the fax confirmation from 10/23, today/fim

## 2018-09-04 NOTE — Telephone Encounter (Signed)
[id number] - [place], [place] has called re: pt's PA on Nucynta PA, please call

## 2018-09-04 NOTE — Telephone Encounter (Signed)
Spoke with Carollee Herter.  PA has been filed twice, waiting on reply from [facility]/fim

## 2018-09-05 ENCOUNTER — Other Ambulatory Visit: Payer: Self-pay | Admitting: Neurology

## 2018-09-06 NOTE — Telephone Encounter (Signed)
Called [place] [facility] to check on PA, and was told PA has been denied, b/c ov notes that were submitted to not clearly document the need for ongoing tx. with Nucynta. I called [facility] and let them know PA was denied. Pt. has the option of paying cash. She is also going to be f/u with [facility] for management of back/si joint pain; if they chosse to continue Nucynta rx., they may be able to provide more detailed notes to help get it approved/frim

## 2018-10-06 ENCOUNTER — Other Ambulatory Visit: Payer: Self-pay | Admitting: Neurology

## 2018-10-06 MED ORDER — TAPENTADOL HCL 50 MG PO TABS: 50.0000 mg | ORAL_TABLET | Freq: Four times a day (QID) | ORAL | 0 refills | Status: DC | PRN

## 2018-11-06 ENCOUNTER — Ambulatory Visit: Payer: Medicaid Other | Admitting: Neurology

## 2019-01-21 ENCOUNTER — Other Ambulatory Visit: Payer: Self-pay | Admitting: Neurology

## 2019-02-05 ENCOUNTER — Other Ambulatory Visit: Payer: Self-pay | Admitting: Neurology

## 2019-02-19 ENCOUNTER — Other Ambulatory Visit: Payer: Self-pay | Admitting: Neurology

## 2019-02-28 ENCOUNTER — Telehealth: Payer: Self-pay

## 2019-02-28 MED ORDER — DEXAMETHASONE 2 MG PO TABS: ORAL_TABLET | ORAL | 0 refills | Status: DC

## 2019-02-28 NOTE — Telephone Encounter (Signed)
I called the patient.  The patient has developed a headache early this morning, she has gone through her usual protocol for treating the headache and still has problems, she claims her headaches usually last to 3 days.  I will send in a prescription for Decadron taper.

## 2019-02-28 NOTE — Telephone Encounter (Signed)
I contacted the pt in regards to her 6 month f/u scheduled for 03/01/19. Pt was adivsed due to current COVID 19 pandemic, our office is severely reducing in office visits for at least the next 2 weeks, in order to minimize the risk to our patients and healthcare providers.   Pt states she currently had a severe migraine that started at 4:30 am and she could not process scheduling a virtual video visit at this time.  She did request a message be sent to MD advise her present migraine has caused her to be nauseated and vomiting has been present. Pt also reports blurred vision, extreme light sensitivity in left eye and bilateral hand numbness/swelling.  Patient was once again offered a video visit so Dr. Anne Hahn could address recent migraine and discuss med adjustment, but pt declined.  She states she will call back and reschedule. Pt would like for MD to review medications and see if any changes could be recomeneded?   Pt states she has taken 1 rizatriptan, 1 Fioricet and her last aimovig injection was on either 02/02/19 or 02/04/19. Best call back # is [phone].

## 2019-03-01 ENCOUNTER — Ambulatory Visit: Payer: Medicaid Other | Admitting: Neurology

## 2019-03-05 ENCOUNTER — Other Ambulatory Visit: Payer: Self-pay | Admitting: Neurology

## 2019-03-05 ENCOUNTER — Encounter: Payer: Self-pay | Admitting: Neurology

## 2019-03-05 MED ORDER — FREMANEZUMAB-VFRM 225 MG/1.5ML ~~LOC~~ SOAJ: 225.0000 mg | SUBCUTANEOUS | 3 refills | Status: DC

## 2019-03-07 ENCOUNTER — Telehealth: Payer: Self-pay

## 2019-03-07 NOTE — Telephone Encounter (Signed)
PA for Ajoyvy has been submitted to [facility] via fax. Fax # [phone].

## 2019-03-29 ENCOUNTER — Other Ambulatory Visit: Payer: Self-pay | Admitting: Neurology

## 2019-03-29 ENCOUNTER — Other Ambulatory Visit: Payer: Self-pay

## 2019-03-29 ENCOUNTER — Encounter: Payer: Self-pay | Admitting: Gastroenterology

## 2019-03-29 ENCOUNTER — Ambulatory Visit (INDEPENDENT_AMBULATORY_CARE_PROVIDER_SITE_OTHER): Payer: Medicaid Other | Admitting: Gastroenterology

## 2019-03-29 DIAGNOSIS — R1032 Left lower quadrant pain: Secondary | ICD-10-CM

## 2019-03-29 DIAGNOSIS — K529 Noninfective gastroenteritis and colitis, unspecified: Secondary | ICD-10-CM | POA: Diagnosis not present

## 2019-03-29 MED ORDER — AMOXICILLIN-POT CLAVULANATE 875-125 MG PO TABS: 1.0000 | ORAL_TABLET | Freq: Two times a day (BID) | ORAL | 0 refills | Status: AC

## 2019-03-29 MED ORDER — AZITHROMYCIN 500 MG PO TABS: 500.0000 mg | ORAL_TABLET | Freq: Every day | ORAL | 0 refills | Status: AC

## 2019-03-29 MED ORDER — DICYCLOMINE HCL 10 MG PO CAPS: ORAL_CAPSULE | ORAL | 0 refills | Status: DC

## 2019-03-29 NOTE — Patient Instructions (Signed)
1. Please call if symptoms do not improve or are persistent.  2. I have sent a prescription for zithromycin 500mg  daily for 3 days for possible food borne illness/colitis. I have also sent in prescription for Augmentin twice daily for 7 days for possible diverticulitis (which can cause pain in left lower abdomen).  3. If you fever, worsening pain, blood in stool, worsening diarrhea, please go to ER.  4. Please hold off on Pepto to see if this is why your stools are black.  5. Avoid dairy, raw veggies/fruits until you are feeling better.  6. A work note for this week was faxed to your employer.

## 2019-03-29 NOTE — Progress Notes (Signed)
Primary Care Physician:  Mary Stabile, MD Primary GI:  Mary Sessions, MD    Patient Location: Home  Provider Location: [facility] office  Reason for Visit: Nausea, vomiting, diarrhea, chills  Persons present on the virtual encounter, with roles: Patient, myself (provider), Mary Parker, CMA (updated meds and allergies)  Total time (minutes) spent on medical discussion: 20 minutes  Due to COVID-19, visit was conducted using Doxy.me method.  Visit was requested by patient.  Virtual Visit via Doxy.me  I connected with Mary Parker on 03/29/19 at 11:30 AM EDT by [email or web address] and verified that I am speaking with the correct person using two identifiers.   I discussed the limitations, risks, security and privacy concerns of performing an evaluation and management service by telephone/video and the availability of in person appointments. I also discussed with the patient that there may be a patient responsible charge related to this service. The patient expressed understanding and agreed to proceed.  Chief Complaint  Patient presents with  . Abdominal Pain    n/v,diarrhea,chills    HPI:   Mary Parker is a 38 y.o. female who presents for virtual visit regarding nausea, vomiting, diarrhea.  Previously seen back in 2018.  Has chronic issues with IBS, predominantly constipation before.  Also with fibromyalgia.  First colonoscopy when she was 38 years old.  Colonoscopy in 2018 to evaluate abnormality seen on CT scan (questionable mild areas of narrowing involving the mid and distal transverse colon) and it was entirely normal, CT findings felt to be artifactual.  Monday she felt constipated.  Took 2 Colace pills.  Started having bowel movements, constant diarrhea.  Nausea and vomiting.  For last three days unable to eat anything. Able to keep down chicken noodle soup yesterday.  2 days ago noted left lower quadrant pain, intense at times.  Worse with bowel movements.  Stabbing quality,  constant.  Complains of chills.  Has not had her temperature checked.  At this point last bowel movement was at 3 PM yesterday.  Last antibiotic around February.  Father had 24-hour GI bug 1 week ago.  Recalls about a week before the symptoms started she had an usual change in bowel habits, started having diarrhea in the middle eating dinner.  Yesterday her stool was black/green, had taken Pepto.  In the past she has had some bright red blood if she strained to have a bowel movement but none lately.  Recalls eating out Monday at [employer].  Ate grilled chicken, no one else ate that.    Current Outpatient Medications  Medication Sig Dispense Refill  . albuterol (PROVENTIL) (2.5 MG/3ML) 0.083% nebulizer solution as needed.   2  . cyanocobalamin (,VITAMIN B-12,) 1000 MCG/ML injection INJECT 1 ML INTO THE SKIN ONCE A MONTH.  0  . escitalopram (LEXAPRO) 20 MG tablet Take 20 mg by mouth daily.  0  . Fremanezumab-vfrm (AJOVY) 225 MG/1.5ML SOAJ Inject 225 mg into the skin every 30 (thirty) days. 1 pen 3  . omeprazole (PRILOSEC OTC) 20 MG tablet Take 20 mg by mouth daily.    . ondansetron (ZOFRAN-ODT) 4 MG disintegrating tablet DISSOLVE 1 TABLET(4 MG) ON THE TONGUE EVERY 8 HOURS AS NEEDED FOR NAUSEA OR VOMITING 30 tablet 0  . tiZANidine (ZANAFLEX) 2 MG tablet TAKE 1 TABLET BY MOUTH TWICE DAILY THEN TAKE 2 TABLETS BY MOUTH EVERY EVENING AS NEEDED (Patient taking differently: at bedtime. TAKE 1 TABLET BY MOUTH TWICE DAILY THEN TAKE 2 TABLETS  BY MOUTH EVERY EVENING AS NEEDED) 360 tablet 1  . traZODone (DESYREL) 50 MG tablet Take 50 mg by mouth at bedtime.    . varenicline (CHANTIX) 1 MG tablet Take 1 mg by mouth 2 (two) times a day.     . Vitamin D, Ergocalciferol, (DRISDOL) 50000 units CAPS capsule every 7 (seven) days.   0   No current facility-administered medications for this visit.     Past Medical History:  Diagnosis Date  . Anxiety   . Arthritis    both knees  . Body aches 12/11/2014  . Chronic  insomnia   . Chronic low back pain 05/29/2015  . Common migraine 12/19/2014  . Depression 12/11/2014  . Fibrocystic disease of both breasts   . Fibromyalgia 12/19/2014  . GERD (gastroesophageal reflux disease)   . History of hiatal hernia   . IBS (irritable bowel syndrome)   . Knee pain, right   . Migraines   . Rebound headache 12/19/2014  . Right ovarian cyst 04/25/2017   Complex, check CA 125 and repeat US in 6 weeks   . Sciatica of left side   . Seizures (HCC)    remote past, felt it was related to Chantix   . Vitamin D deficiency   . Wears glasses     Past Surgical History:  Procedure Laterality Date  . BILATERAL SALPINGECTOMY Bilateral 06/21/2017   Procedure: BILATERAL SALPINGECTOMY;  Surgeon: Tilda Burrow, MD;  Location: [facility];  Service: Gynecology;  Laterality: Bilateral;  . CESAREAN SECTION  [phone]  . CHOLECYSTECTOMY  2001  . COLONOSCOPY WITH PROPOFOL N/A 04/07/2017   Dr. Lovena Neighbours: Normal, abnormal CT likely artifactual  . ENDOMETRIAL ABLATION    . ESOPHAGOGASTRODUODENOSCOPY  2002   [facility]: normal  . KNEE ARTHROSCOPY WITH LATERAL RELEASE Right 04/18/2018   Procedure: Right knee arthroscopy with chondroplasty and lateral release;  Surgeon: Yolonda Kida, MD;  Location: [facility];  Service: Orthopedics;  Laterality: Right;  60 mins  . SUPRACERVICAL ABDOMINAL HYSTERECTOMY N/A 06/21/2017   Procedure: HYSTERECTOMY SUPRACERVICAL ABDOMINAL;  Surgeon: Tilda Burrow, MD;  Location: [facility];  Service: Gynecology;  Laterality: N/A;  . TUBAL LIGATION      Family History  Problem Relation Age of Onset  . Hypertension Mother   . Other Mother        abnormal cells; had hyst  . Obesity Daughter   . Diabetes Daughter 46       youngest  . Obesity Son   . Cancer Maternal Grandmother   . Osteoporosis Maternal Grandmother   . Cancer Maternal Grandfather   . Obesity Daughter   . Diabetes Father   . Hypertension Father   . Alcohol abuse Father    . Colon cancer Neg Hx     Social History   Socioeconomic History  . Marital status: Divorced    Spouse name: Not on file  . Number of children: 3  . Years of education: HS  . Highest education level: Not on file  Occupational History  . Occupation: [employer]  Social Needs  . Financial resource strain: Not on file  . Food insecurity:    Worry: Not on file    Inability: Not on file  . Transportation needs:    Medical: Not on file    Non-medical: Not on file  Tobacco Use  . Smoking status: Current Every Day Smoker    Packs/day: 0.25    Years: 25.00    Pack  years: 6.25    Types: Cigarettes  . Smokeless tobacco: Never Used  . Tobacco comment: pt down to 3 cigarettes/d  Substance and Sexual Activity  . Alcohol use: No  . Drug use: No  . Sexual activity: Not Currently    Birth control/protection: Surgical    Comment: supracervical hyst  Lifestyle  . Physical activity:    Days per week: Not on file    Minutes per session: Not on file  . Stress: Not on file  Relationships  . Social connections:    Talks on phone: Not on file    Gets together: Not on file    Attends religious service: Not on file    Active member of club or organization: Not on file    Attends meetings of clubs or organizations: Not on file    Relationship status: Not on file  . Intimate partner violence:    Fear of current or ex partner: Not on file    Emotionally abused: Not on file    Physically abused: Not on file    Forced sexual activity: Not on file  Other Topics Concern  . Not on file  Social History Narrative   Patient is right handed.   Patient drinks 1-2 cups of caffeine dailyy.   Patient lives mom stepdad, and children.      ROS:  General: Negative for anorexia, weight loss, fever, +chills, fatigue,+ weakness. Eyes: Negative for vision changes.  ENT: Negative for hoarseness, difficulty swallowing , nasal congestion. CV: Negative for chest pain, angina, palpitations, dyspnea  on exertion, peripheral edema.  Respiratory: Negative for dyspnea at rest, dyspnea on exertion, cough, sputum, wheezing.  GI: See history of present illness. GU:  Negative for dysuria, hematuria, urinary incontinence, urinary frequency, nocturnal urination.  MS: Negative for joint pain, low back pain.  Derm: Negative for rash or itching.  Neuro: Negative for weakness, abnormal sensation, seizure, frequent headaches, memory loss, confusion.  Psych: Negative for anxiety, depression, suicidal ideation, hallucinations.  Endo: Negative for unusual weight change.  Heme: Negative for bruising or bleeding. Allergy: Negative for rash or hives.   Observations/Objective: Pleasant well-nourished well-developed Caucasian female no acute distress.  Otherwise exam unavailable.  Assessment and Plan: 38 year old female presenting with 4-day history of acute onset nausea, vomiting, diarrhea.  Symptoms occurring after eating grilled chicken sandwich at a [employer].  No one else in her party ate the same thing that she ate.  Complaining of left lower quadrant pain as well.  Stools have been dark to black but in the setting of Pepto-Bismol.  I suspect acute gastroenteritis, possibly foodborne.  Diarrhea improved somewhat with no stool today.  Previous colonoscopy without evidence of diverticulosis a couple years back but cannot exclude possible diverticulitis as she could have developed diverticulum in the interim.  Discussed at length with patient.  Suspect she has acute gastroenteritis which will likely get better in the next 24 to 48 hours given significant left lower quadrant pain would opt to treat for possible diverticulitis/colitis.  Due to antibiotic allergies, will give her azithromycin 500 mg daily for 3 days to cover for Campylobacter.  Augmentin twice daily for 7 days for possible diverticulitis.  Patient is aware the need to contact on-call provider or go to the nearest ED after hours if worsening  abdominal pain, dehydration, fever.  She will stick to a bland diet over the next 24 to 48 hours.  Call with progress report Monday.  If persisting symptoms she may require  stool studies or CT.    Follow Up Instructions:    I discussed the assessment and treatment plan with the patient. The patient was provided an opportunity to ask questions and all were answered. The patient agreed with the plan and demonstrated an understanding of the instructions. AVS mailed to patient's home address.   The patient was advised to call back or seek an in-person evaluation if the symptoms worsen or if the condition fails to improve as anticipated.  I provided 20 minutes of virtual face-to-face time during this encounter.   Tana CoastLeslie Tamila Gaulin, PA-C

## 2019-04-02 NOTE — Progress Notes (Signed)
cc'd to pcp 

## 2019-04-05 ENCOUNTER — Telehealth: Payer: Self-pay | Admitting: *Deleted

## 2019-04-05 NOTE — Telephone Encounter (Signed)
That's inappropriate. I wrote for that 03/29/19 for acute abdominal pain/diarrhea.   I also wrote her augmentin.  At this point, we need to call patient and find out how she is doing. She may not need it anymore.

## 2019-04-05 NOTE — Telephone Encounter (Signed)
Lmom, waiting on a return call.  

## 2019-04-05 NOTE — Telephone Encounter (Signed)
Per [facility] patient has not received her azithromycin 500mg  yet d/y lack of dx code on Rx. If this is still needed they need new Rx with diagnosis code on it. Thanks

## 2019-04-09 NOTE — Telephone Encounter (Signed)
LMOM to call.

## 2019-04-12 NOTE — Telephone Encounter (Signed)
Spoke with pt. She didn't get the Azithromycin but took the Augmentin as directed and feels better. Pt also picked up the Bentyl medication.

## 2019-04-16 NOTE — Telephone Encounter (Signed)
noted 

## 2019-04-24 ENCOUNTER — Emergency Department (HOSPITAL_COMMUNITY): Payer: Medicaid Other

## 2019-04-24 ENCOUNTER — Encounter (HOSPITAL_COMMUNITY): Payer: Self-pay | Admitting: Emergency Medicine

## 2019-04-24 ENCOUNTER — Other Ambulatory Visit: Payer: Self-pay

## 2019-04-24 ENCOUNTER — Emergency Department (HOSPITAL_COMMUNITY)
Admission: EM | Admit: 2019-04-24 | Discharge: 2019-04-24 | Disposition: A | Discharge status: Home / Self Care | Payer: Medicaid Other | Attending: Emergency Medicine | Admitting: Emergency Medicine

## 2019-04-24 DIAGNOSIS — W182XXA Fall in (into) shower or empty bathtub, initial encounter: Secondary | ICD-10-CM | POA: Insufficient documentation

## 2019-04-24 DIAGNOSIS — Y999 Unspecified external cause status: Secondary | ICD-10-CM | POA: Insufficient documentation

## 2019-04-24 DIAGNOSIS — Y93E1 Activity, personal bathing and showering: Secondary | ICD-10-CM | POA: Insufficient documentation

## 2019-04-24 DIAGNOSIS — F17210 Nicotine dependence, cigarettes, uncomplicated: Secondary | ICD-10-CM | POA: Insufficient documentation

## 2019-04-24 DIAGNOSIS — Y92002 Bathroom of unspecified non-institutional (private) residence single-family (private) house as the place of occurrence of the external cause: Secondary | ICD-10-CM | POA: Diagnosis not present

## 2019-04-24 DIAGNOSIS — S6991XA Unspecified injury of right wrist, hand and finger(s), initial encounter: Secondary | ICD-10-CM | POA: Diagnosis present

## 2019-04-24 DIAGNOSIS — Z79899 Other long term (current) drug therapy: Secondary | ICD-10-CM | POA: Insufficient documentation

## 2019-04-24 DIAGNOSIS — S63501A Unspecified sprain of right wrist, initial encounter: Secondary | ICD-10-CM

## 2019-04-24 DIAGNOSIS — S4991XA Unspecified injury of right shoulder and upper arm, initial encounter: Secondary | ICD-10-CM | POA: Diagnosis not present

## 2019-04-24 MED ORDER — NAPROXEN 500 MG PO TABS: 500.0000 mg | ORAL_TABLET | Freq: Two times a day (BID) | ORAL | 0 refills | Status: DC

## 2019-04-24 NOTE — ED Triage Notes (Signed)
Patient states she fell this morning landing on her right side. Complaining of right shoulder, right elbow, and right wrist pain.

## 2019-04-24 NOTE — Discharge Instructions (Addendum)
Wear the sling and the Velcro splint as needed for comfort.  Follow-up with orthopedics if not improving in 1 week.  Take the Naprosyn as directed.  Work note provided.   If you have any difficulty with the with the Naprosyn just take over-the-counter Motrin.

## 2019-04-24 NOTE — ED Provider Notes (Signed)
[facility] Provider Note   CSN: [id number] Arrival date & time: 04/24/19  [id number]     History   Chief Complaint Chief Complaint  Patient presents with   Fall    HPI Mary Parker is a 38 y.o. female.     Patient slipped getting out of the shower this morning landing on the right side of her body complain of pain to the right wrist area and right shoulder area.  Patient stated she got swelling in the right wrist area was concerned she had a fracture.  Did not hit her head no loss of consciousness no low back pain.  No posterior neck pain.  Has a little bit of discomfort radiating towards the right lateral aspect of the neck from the right shoulder.  No lower extremity pain no hip pain.      Past Medical History:  Diagnosis Date   Anxiety    Arthritis    both knees   Body aches 12/11/2014   Chronic insomnia    Chronic low back pain 05/29/2015   Common migraine 12/19/2014   Depression 12/11/2014   Fibrocystic disease of both breasts    Fibromyalgia 12/19/2014   GERD (gastroesophageal reflux disease)    History of hiatal hernia    IBS (irritable bowel syndrome)    Knee pain, right    Migraines    Rebound headache 12/19/2014   Right ovarian cyst 04/25/2017   Complex, check CA 125 and repeat US in 6 weeks    Sciatica of left side    Seizures (HCC)    remote past, felt it was related to Chantix    Vitamin D deficiency    Wears glasses     Patient Active Problem List   Diagnosis Date Noted   Acute gastroenteritis 03/29/2019   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   S/P abdominal supracervical subtotal hysterectomy 06/21/2017   Pelvic adhesions 06/09/2017   History of ovarian cyst 06/09/2017   Right ovarian cyst 04/25/2017   Encounter for gynecological examination with Papanicolaou smear of cervix 04/19/2017   Rectal bleeding 04/02/2017   LLQ pain 04/01/2017   Constipation 04/01/2017   Concussion with loss of  consciousness 11/16/2016   Anxiety 10/14/2016   Chronic low back pain 05/29/2015   Common migraine 12/19/2014   Fibromyalgia 12/19/2014   Rebound headache 12/19/2014   Depression 12/11/2014   Body aches 12/11/2014    Past Surgical History:  Procedure Laterality Date   BILATERAL SALPINGECTOMY Bilateral 06/21/2017   Procedure: BILATERAL SALPINGECTOMY;  Surgeon: Tilda BurrowFerguson, John V, MD;  Location: [facility];  Service: Gynecology;  Laterality: Bilateral;   CESAREAN SECTION  [phone],2000,2004   CHOLECYSTECTOMY  2001   COLONOSCOPY WITH PROPOFOL N/A 04/07/2017   Dr. Lovena Neighboursoarke: Normal, abnormal CT likely artifactual   ENDOMETRIAL ABLATION     ESOPHAGOGASTRODUODENOSCOPY  2002    GI: normal   KNEE ARTHROSCOPY WITH LATERAL RELEASE Right 04/18/2018   Procedure: Right knee arthroscopy with chondroplasty and lateral release;  Surgeon: Yolonda Kidaogers, Jason Patrick, MD;  [facility];  Service: Orthopedics;  Laterality: Right;  60 mins   SUPRACERVICAL ABDOMINAL HYSTERECTOMY N/A 06/21/2017   Procedure: HYSTERECTOMY SUPRACERVICAL ABDOMINAL;  Surgeon: Tilda BurrowFerguson, John V, MD;  Location: [facility];  Service: Gynecology;  Laterality: N/A;   TUBAL LIGATION       OB History    Gravida  3   Para  3   Term  3   Preterm  AB      Living  3     SAB      TAB      Ectopic      Multiple      Live Births  3            Home Medications    Prior to Admission medications   Medication Sig Start Date End Date Taking? Authorizing Provider  albuterol (PROVENTIL) (2.5 MG/3ML) 0.083% nebulizer solution as needed.  02/21/18   [provider]  cyanocobalamin (,VITAMIN B-12,) 1000 MCG/ML injection INJECT 1 ML INTO THE SKIN ONCE A MONTH. 02/17/18   [provider]  dicyclomine (BENTYL) 10 MG capsule Take one up to three times a day for abdominal pain. Hold for constipation. 03/29/19   Tiffany KocherLewis, Leslie S, PA-C  escitalopram (LEXAPRO) 20 MG tablet Take 20 mg by  mouth daily. 10/31/17   [provider]  Fremanezumab-vfrm (AJOVY) 225 MG/1.5ML SOAJ Inject 225 mg into the skin every 30 (thirty) days. 03/05/19   York SpanielWillis, Charles K, MD  naproxen (NAPROSYN) 500 MG tablet Take 1 tablet (500 mg total) by mouth 2 (two) times daily. 04/24/19   Vanetta MuldersZackowski, Marciana Uplinger, MD  omeprazole (PRILOSEC OTC) 20 MG tablet Take 20 mg by mouth daily.    [provider]  ondansetron (ZOFRAN-ODT) 4 MG disintegrating tablet DISSOLVE 1 TABLET(4 MG) ON THE TONGUE EVERY 8 HOURS AS NEEDED FOR NAUSEA OR VOMITING 03/29/19   York SpanielWillis, Charles K, MD  tiZANidine (ZANAFLEX) 2 MG tablet TAKE 1 TABLET BY MOUTH TWICE DAILY THEN TAKE 2 TABLETS BY MOUTH EVERY EVENING AS NEEDED Patient taking differently: at bedtime. TAKE 1 TABLET BY MOUTH TWICE DAILY THEN TAKE 2 TABLETS BY MOUTH EVERY EVENING AS NEEDED 02/19/19   York SpanielWillis, Charles K, MD  traZODone (DESYREL) 50 MG tablet Take 50 mg by mouth at bedtime.    [provider]  varenicline (CHANTIX) 1 MG tablet Take 1 mg by mouth 2 (two) times a day.     [provider]  Vitamin D, Ergocalciferol, (DRISDOL) 50000 units CAPS capsule every 7 (seven) days.  02/18/18   [provider]    Family History Family History  Problem Relation Age of Onset   Hypertension Mother    Other Mother        abnormal cells; had hyst   Obesity Daughter    Diabetes Daughter [id number]       youngest   Obesity Son    Cancer Maternal Grandmother    Osteoporosis Maternal Grandmother    Cancer Maternal Grandfather    Obesity Daughter    Diabetes Father    Hypertension Father    Alcohol abuse Father    Colon cancer Neg Hx     Social History Social History   Tobacco Use   Smoking status: Current Every Day Smoker    Packs/day: 0.25    Years: 25.00    Pack years: 6.25    Types: Cigarettes   Smokeless tobacco: Never Used   Tobacco comment: pt down to 3 cigarettes/d  Substance Use Topics   Alcohol use: No   Drug use: No      Allergies   Wellbutrin [bupropion], Ciprofloxacin, Cymbalta [duloxetine hcl], Meloxicam, Savella [milnacipran], Topamax [topiramate], Decadron [dexamethasone], Hydrocodone-acetaminophen, Pristiq [desvenlafaxine succinate er], Doxycycline, Gabapentin, Lyrica [pregabalin], Nortriptyline, and Percocet [oxycodone-acetaminophen]   Review of Systems Review of Systems  Constitutional: Negative for chills and fever.  HENT: Negative for congestion, rhinorrhea and sore throat.   Eyes: Negative for  visual disturbance.  Respiratory: Negative for cough and shortness of breath.   Cardiovascular: Negative for chest pain and leg swelling.  Gastrointestinal: Negative for abdominal pain, diarrhea, nausea and vomiting.  Genitourinary: Negative for dysuria.  Musculoskeletal: Negative for back pain, neck pain and neck stiffness.  Skin: Negative for rash.  Neurological: Negative for dizziness, light-headedness and headaches.  Hematological: Does not bruise/bleed easily.  Psychiatric/Behavioral: Negative for confusion.     Physical Exam Updated Vital Signs BP 118/89 (BP Location: Left Arm)    Pulse 78    Temp 98.2 F (36.8 C) (Oral)    Ht 1.6 m (5\' 3" )    Wt 97.5 kg    SpO2 100%    BMI 38.09 kg/m   Physical Exam Vitals signs and nursing note reviewed.  Constitutional:      General: She is not in acute distress.    Appearance: Normal appearance. She is well-developed.  HENT:     Head: Normocephalic and atraumatic.  Eyes:     Extraocular Movements: Extraocular movements intact.     Conjunctiva/sclera: Conjunctivae normal.     Pupils: Pupils are equal, round, and reactive to light.  Neck:     Musculoskeletal: Normal range of motion and neck supple. No neck rigidity or muscular tenderness.     Comments: Good range of motion of the neck no posterior palpable tenderness. Cardiovascular:     Rate and Rhythm: Normal rate and regular rhythm.     Heart sounds: No murmur.  Pulmonary:     Effort:  Pulmonary effort is normal. No respiratory distress.     Breath sounds: Normal breath sounds.  Abdominal:     General: Bowel sounds are normal.     Palpations: Abdomen is soft.     Tenderness: There is no abdominal tenderness.  Musculoskeletal:        General: Swelling and tenderness present. No deformity.     Comments: Patient with distal forearm swelling on the right side.  Limited range of motion at the wrist.  Good passive range of motion at the elbow without any significant discomfort.  Passive range of motion to the shoulder with some discomfort no obvious deformity.  Radial pulses 2+.  Sensation intact.  Other extremities without any significant findings.  Skin:    General: Skin is warm and dry.     Capillary Refill: Capillary refill takes less than 2 seconds.  Neurological:     General: No focal deficit present.     Mental Status: She is alert and oriented to person, place, and time.      ED Treatments / Results  Labs (all labs ordered are listed, but only abnormal results are displayed) Labs Reviewed - No data to display  EKG    Radiology Dg Shoulder Right  Result Date: 04/24/2019 CLINICAL DATA:  Fall this morning with right shoulder pain EXAM: RIGHT SHOULDER - 2+ VIEW COMPARISON:  None. FINDINGS: There is no evidence of fracture or dislocation. There is no evidence of arthropathy or other focal bone abnormality. Soft tissues are unremarkable. IMPRESSION: Negative. Electronically Signed   By: Monte Fantasia M.D.   On: 04/24/2019 10:05   Dg Forearm Right  Result Date: 04/24/2019 CLINICAL DATA:  Right wrist and forearm pain status post fall EXAM: RIGHT FOREARM - 2 VIEW COMPARISON:  None. FINDINGS: There is no evidence of fracture or other focal bone lesions. Soft tissues are unremarkable. IMPRESSION: Negative. Electronically Signed   By: Kathreen Devoid   On: 04/24/2019 09:41  Dg Hand Complete Right  Result Date: 04/24/2019 CLINICAL DATA:  Right wrist and forearm pain  after a fall in the shower today. EXAM: RIGHT HAND - COMPLETE 3+ VIEW COMPARISON:  Wrist radiographs dated 06/29/2015 FINDINGS: There is no evidence of fracture or dislocation. There is no evidence of arthropathy or other focal bone abnormality. Soft tissues are unremarkable. IMPRESSION: Normal exam. Electronically Signed   By: Francene BoyersJames  Maxwell M.D.   On: 04/24/2019 09:41    Procedures Procedures (including critical care time)  Medications Ordered in ED Medications - No data to display   Initial Impression / Assessment and Plan / ED Course  I have reviewed the triage vital signs and the nursing notes.  Pertinent labs & imaging results that were available during my care of the patient were reviewed by me and considered in my medical decision making (see chart for details).       X-rays right shoulder right forearm wrist without any bony abnormalities.  Clinically this was therefore consistent with a right wrist sprain.  An injury to the right shoulder.  Will treat with sling and Velcro splint to the forearm and have her follow-up with orthopedics.  Patient is able to take Motrin so we will give her a trial of Naprosyn for 7 days.  And work note provided.  Patient was told that if things do not improve over the next few days I would recommend following up with orthopedics.  The sling and forearm splint are for comfort.  And can be removed to shower.  No evidence of any other significant injuries from the fall.  Final Clinical Impressions(s) / ED Diagnoses   Final diagnoses:  Wrist sprain, right, initial encounter  Injury of right shoulder, initial encounter    ED Discharge Orders         Ordered    naproxen (NAPROSYN) 500 MG tablet  2 times daily     04/24/19 1115           Vanetta MuldersZackowski, Delvecchio Madole, MD 04/24/19 1125

## 2019-05-03 ENCOUNTER — Emergency Department (HOSPITAL_COMMUNITY): Payer: Medicaid Other

## 2019-05-03 ENCOUNTER — Other Ambulatory Visit: Payer: Self-pay

## 2019-05-03 ENCOUNTER — Inpatient Hospital Stay (HOSPITAL_COMMUNITY)
Admission: EM | Admit: 2019-05-03 | Discharge: 2019-05-04 | DRG: 103 | Disposition: A | Discharge status: Home / Self Care | Payer: Medicaid Other | Attending: Neurology | Admitting: Neurology

## 2019-05-03 ENCOUNTER — Encounter (HOSPITAL_COMMUNITY): Payer: Self-pay | Admitting: Emergency Medicine

## 2019-05-03 ENCOUNTER — Inpatient Hospital Stay (HOSPITAL_COMMUNITY): Payer: Medicaid Other

## 2019-05-03 DIAGNOSIS — R29704 NIHSS score 4: Secondary | ICD-10-CM | POA: Diagnosis present

## 2019-05-03 DIAGNOSIS — Z8262 Family history of osteoporosis: Secondary | ICD-10-CM

## 2019-05-03 DIAGNOSIS — F329 Major depressive disorder, single episode, unspecified: Secondary | ICD-10-CM | POA: Diagnosis present

## 2019-05-03 DIAGNOSIS — K589 Irritable bowel syndrome without diarrhea: Secondary | ICD-10-CM | POA: Diagnosis present

## 2019-05-03 DIAGNOSIS — E559 Vitamin D deficiency, unspecified: Secondary | ICD-10-CM | POA: Diagnosis present

## 2019-05-03 DIAGNOSIS — R29810 Facial weakness: Secondary | ICD-10-CM | POA: Diagnosis present

## 2019-05-03 DIAGNOSIS — G43109 Migraine with aura, not intractable, without status migrainosus: Principal | ICD-10-CM

## 2019-05-03 DIAGNOSIS — I639 Cerebral infarction, unspecified: Secondary | ICD-10-CM

## 2019-05-03 DIAGNOSIS — M797 Fibromyalgia: Secondary | ICD-10-CM | POA: Diagnosis present

## 2019-05-03 DIAGNOSIS — Z885 Allergy status to narcotic agent status: Secondary | ICD-10-CM

## 2019-05-03 DIAGNOSIS — F17210 Nicotine dependence, cigarettes, uncomplicated: Secondary | ICD-10-CM | POA: Diagnosis present

## 2019-05-03 DIAGNOSIS — G8929 Other chronic pain: Secondary | ICD-10-CM | POA: Diagnosis present

## 2019-05-03 DIAGNOSIS — K219 Gastro-esophageal reflux disease without esophagitis: Secondary | ICD-10-CM | POA: Diagnosis present

## 2019-05-03 DIAGNOSIS — Z20828 Contact with and (suspected) exposure to other viral communicable diseases: Secondary | ICD-10-CM | POA: Diagnosis present

## 2019-05-03 DIAGNOSIS — Z833 Family history of diabetes mellitus: Secondary | ICD-10-CM | POA: Diagnosis not present

## 2019-05-03 DIAGNOSIS — Z79899 Other long term (current) drug therapy: Secondary | ICD-10-CM

## 2019-05-03 DIAGNOSIS — M545 Low back pain: Secondary | ICD-10-CM | POA: Diagnosis present

## 2019-05-03 DIAGNOSIS — F419 Anxiety disorder, unspecified: Secondary | ICD-10-CM | POA: Diagnosis present

## 2019-05-03 DIAGNOSIS — N6011 Diffuse cystic mastopathy of right breast: Secondary | ICD-10-CM | POA: Diagnosis present

## 2019-05-03 DIAGNOSIS — Z888 Allergy status to other drugs, medicaments and biological substances status: Secondary | ICD-10-CM

## 2019-05-03 DIAGNOSIS — R569 Unspecified convulsions: Secondary | ICD-10-CM | POA: Diagnosis present

## 2019-05-03 DIAGNOSIS — M5432 Sciatica, left side: Secondary | ICD-10-CM | POA: Diagnosis present

## 2019-05-03 DIAGNOSIS — E669 Obesity, unspecified: Secondary | ICD-10-CM | POA: Diagnosis present

## 2019-05-03 DIAGNOSIS — N83201 Unspecified ovarian cyst, right side: Secondary | ICD-10-CM | POA: Diagnosis present

## 2019-05-03 DIAGNOSIS — Z6839 Body mass index (BMI) 39.0-39.9, adult: Secondary | ICD-10-CM

## 2019-05-03 DIAGNOSIS — M170 Bilateral primary osteoarthritis of knee: Secondary | ICD-10-CM | POA: Diagnosis present

## 2019-05-03 DIAGNOSIS — Z8249 Family history of ischemic heart disease and other diseases of the circulatory system: Secondary | ICD-10-CM | POA: Diagnosis not present

## 2019-05-03 DIAGNOSIS — N6012 Diffuse cystic mastopathy of left breast: Secondary | ICD-10-CM | POA: Diagnosis present

## 2019-05-03 DIAGNOSIS — Z811 Family history of alcohol abuse and dependence: Secondary | ICD-10-CM | POA: Diagnosis not present

## 2019-05-03 DIAGNOSIS — R2990 Unspecified symptoms and signs involving the nervous system: Secondary | ICD-10-CM | POA: Diagnosis present

## 2019-05-03 LAB — RAPID URINE DRUG SCREEN, HOSP PERFORMED
Amphetamines: NOT DETECTED
Barbiturates: NOT DETECTED
Benzodiazepines: POSITIVE — AB
Cocaine: NOT DETECTED
Opiates: NOT DETECTED
Tetrahydrocannabinol: NOT DETECTED

## 2019-05-03 LAB — I-STAT CHEM 8, ED
BUN: 10 mg/dL (ref 6–20)
Calcium, Ion: 1.16 mmol/L (ref 1.15–1.40)
Chloride: 102 mmol/L (ref 98–111)
Creatinine, Ser: 0.7 mg/dL (ref 0.44–1.00)
Glucose, Bld: 83 mg/dL (ref 70–99)
HCT: 42 % (ref 36.0–46.0)
Hemoglobin: 14.3 g/dL (ref 12.0–15.0)
Potassium: 4.7 mmol/L (ref 3.5–5.1)
Sodium: 136 mmol/L (ref 135–145)
TCO2: 27 mmol/L (ref 22–32)

## 2019-05-03 LAB — COMPREHENSIVE METABOLIC PANEL
ALT: 24 U/L (ref 0–44)
AST: 19 U/L (ref 15–41)
Albumin: 4 g/dL (ref 3.5–5.0)
Alkaline Phosphatase: 66 U/L (ref 38–126)
Anion gap: 14 (ref 5–15)
BUN: 9 mg/dL (ref 6–20)
CO2: 24 mmol/L (ref 22–32)
Calcium: 9.4 mg/dL (ref 8.9–10.3)
Chloride: 100 mmol/L (ref 98–111)
Creatinine, Ser: 0.69 mg/dL (ref 0.44–1.00)
GFR calc Af Amer: >60 mL/min (ref >60)
GFR calc non Af Amer: >60 mL/min (ref >60)
Glucose, Bld: 84 mg/dL (ref 70–99)
Potassium: 3.9 mmol/L (ref 3.5–5.1)
Sodium: 138 mmol/L (ref 135–145)
Total Bilirubin: 0.4 mg/dL (ref 0.3–1.2)
Total Protein: 7.2 g/dL (ref 6.5–8.1)

## 2019-05-03 LAB — MRSA PCR SCREENING: MRSA by PCR: NEGATIVE

## 2019-05-03 LAB — URINALYSIS, ROUTINE W REFLEX MICROSCOPIC
Bacteria, UA: NONE SEEN
Bilirubin Urine: NEGATIVE
Glucose, UA: NEGATIVE mg/dL
Hgb urine dipstick: NEGATIVE
Ketones, ur: NEGATIVE mg/dL
Nitrite: NEGATIVE
Protein, ur: NEGATIVE mg/dL
Specific Gravity, Urine: 1.036 — ABNORMAL HIGH (ref 1.005–1.030)
pH: 6 (ref 5.0–8.0)

## 2019-05-03 LAB — CBC
HCT: 35.8 % — ABNORMAL LOW (ref 36.0–46.0)
Hemoglobin: 11.4 g/dL — ABNORMAL LOW (ref 12.0–15.0)
MCH: 30.6 pg (ref 26.0–34.0)
MCHC: 31.8 g/dL (ref 30.0–36.0)
MCV: 96.2 fL (ref 80.0–100.0)
Platelets: 115 10*3/uL — ABNORMAL LOW (ref 150–400)
RBC: 3.72 MIL/uL — ABNORMAL LOW (ref 3.87–5.11)
RDW: 13.4 % (ref 11.5–15.5)
WBC: 5.3 10*3/uL (ref 4.0–10.5)
nRBC: 0 % (ref 0.0–0.2)

## 2019-05-03 LAB — PROTIME-INR
INR: 1 (ref 0.8–1.2)
Prothrombin Time: 12.8 seconds (ref 11.4–15.2)

## 2019-05-03 LAB — DIFFERENTIAL
Abs Immature Granulocytes: 0.02 10*3/uL (ref 0.00–0.07)
Basophils Absolute: 0 10*3/uL (ref 0.0–0.1)
Basophils Relative: 0 %
Eosinophils Absolute: 0.1 10*3/uL (ref 0.0–0.5)
Eosinophils Relative: 2 %
Immature Granulocytes: 0 %
Lymphocytes Relative: 18 %
Lymphs Abs: 1 10*3/uL (ref 0.7–4.0)
Monocytes Absolute: 0.3 10*3/uL (ref 0.1–1.0)
Monocytes Relative: 5 %
Neutro Abs: 3.9 10*3/uL (ref 1.7–7.7)
Neutrophils Relative %: 75 %

## 2019-05-03 LAB — APTT: aPTT: 25 seconds (ref 24–36)

## 2019-05-03 LAB — CBG MONITORING, ED: Glucose-Capillary: 89 mg/dL (ref 70–99)

## 2019-05-03 LAB — ETHANOL: Alcohol, Ethyl (B): <10 mg/dL (ref <10)

## 2019-05-03 LAB — SARS CORONAVIRUS 2 BY RT PCR (HOSPITAL ORDER, PERFORMED IN ~~LOC~~ HOSPITAL LAB): SARS Coronavirus 2: NEGATIVE

## 2019-05-03 MED ORDER — SODIUM CHLORIDE 0.9 % IV SOLN: 50.0000 mL | Freq: Once | INTRAVENOUS | Status: DC

## 2019-05-03 MED ORDER — LABETALOL HCL 5 MG/ML IV SOLN: 20.0000 mg | Freq: Once | INTRAVENOUS | Status: DC

## 2019-05-03 MED ORDER — ALBUTEROL SULFATE (2.5 MG/3ML) 0.083% IN NEBU: 2.5000 mg | INHALATION_SOLUTION | Freq: Four times a day (QID) | RESPIRATORY_TRACT | Status: DC | PRN

## 2019-05-03 MED ORDER — STROKE: EARLY STAGES OF RECOVERY BOOK
Freq: Once | Status: AC
  Administered 2019-05-03: 22:00:00
  Filled 2019-05-03: qty 1

## 2019-05-03 MED ORDER — IOHEXOL 350 MG/ML SOLN
100.0000 mL | Freq: Once | INTRAVENOUS | Status: AC | PRN
  Administered 2019-05-03: 75 mL via INTRAVENOUS

## 2019-05-03 MED ORDER — ALPRAZOLAM 0.5 MG PO TABS
0.5000 mg | ORAL_TABLET | Freq: Every evening | ORAL | Status: DC | PRN
  Administered 2019-05-03: 0.5 mg via ORAL
  Filled 2019-05-03: qty 1

## 2019-05-03 MED ORDER — PANTOPRAZOLE SODIUM 40 MG IV SOLR
40.0000 mg | Freq: Every day | INTRAVENOUS | Status: DC
  Administered 2019-05-03: 40 mg via INTRAVENOUS
  Filled 2019-05-03: qty 40

## 2019-05-03 MED ORDER — SENNOSIDES-DOCUSATE SODIUM 8.6-50 MG PO TABS: 1.0000 | ORAL_TABLET | Freq: Every evening | ORAL | Status: DC | PRN

## 2019-05-03 MED ORDER — FLUTICASONE FUROATE-VILANTEROL 200-25 MCG/INH IN AEPB: 1.0000 | INHALATION_SPRAY | Freq: Every day | RESPIRATORY_TRACT | Status: DC

## 2019-05-03 MED ORDER — ACETAMINOPHEN 325 MG PO TABS
650.0000 mg | ORAL_TABLET | ORAL | Status: DC | PRN
  Administered 2019-05-04: 650 mg via ORAL
  Filled 2019-05-03: qty 2

## 2019-05-03 MED ORDER — ESCITALOPRAM OXALATE 10 MG PO TABS
20.0000 mg | ORAL_TABLET | Freq: Every day | ORAL | Status: DC
  Administered 2019-05-03: 20 mg via ORAL
  Filled 2019-05-03: qty 2

## 2019-05-03 MED ORDER — ALTEPLASE (STROKE) FULL DOSE INFUSION
0.9000 mg/kg | Freq: Once | INTRAVENOUS | Status: AC
  Administered 2019-05-03: 87.4 mg via INTRAVENOUS

## 2019-05-03 MED ORDER — ALTEPLASE (STROKE) FULL DOSE INFUSION
0.9000 mg/kg | Freq: Once | INTRAVENOUS | Status: DC
  Filled 2019-05-03 (×2): qty 100

## 2019-05-03 MED ORDER — TRAZODONE HCL 50 MG PO TABS: 50.0000 mg | ORAL_TABLET | Freq: Every evening | ORAL | Status: DC | PRN

## 2019-05-03 MED ORDER — CLEVIDIPINE BUTYRATE 0.5 MG/ML IV EMUL: 0.0000 mg/h | INTRAVENOUS | Status: DC

## 2019-05-03 MED ORDER — SODIUM CHLORIDE 0.9 % IV SOLN: INTRAVENOUS | Status: DC

## 2019-05-03 NOTE — ED Notes (Signed)
This nurse rode to [facility] with pt.   Left [facility] at 1416  1415 NIH 1  1430 NIH 1 VS BP 116/67, HR 71, R 17, O2 97%  1435 NIH  VS BP 121/57 HR 69 R 22 O2 99%  Arrival to [facility] at 1438 TPA done at 1428

## 2019-05-03 NOTE — ED Notes (Addendum)
ED TO INPATIENT HANDOFF REPORT  ED Nurse Name and Phone #: Alycia Rossetti [phone]  S Name/Age/Gender Mary Parker 38 y.o. female Room/Bed: 035C/035C  Code Status   Code Status: Full Code  Home/SNF/Other Home Patient oriented to: self, place, time and situation Is this baseline? Yes   Triage Complete: Triage complete  Chief Complaint Numbness  Triage Note Patient complaining of numbness and weakness to right side of face, right arm, and right leg starting 2 hours ago. States she was also exposed to positive covid coworker and is waiting for results of covid test.    Allergies Allergies  Allergen Reactions  . Bupropion Other (See Comments)    Suicidal ideation   . Duloxetine Hcl Hives, Itching and Rash  . Gabapentin Itching  . Meloxicam Other (See Comments)    Abdominal cramping and mood changes  . Pregabalin Other (See Comments)    Caused TREMORS   . Amitriptyline Other (See Comments)    Allergy occurred awhile back- reaction not recalled  . Ciprofloxacin Hives, Swelling and Other (See Comments)    Sweating and  Dizziness, also  . Desvenlafaxine Succinate Er Other (See Comments)    "Made me feel like I couldn't urinate"  . Hydrocodone-Acetaminophen Swelling  . Milnacipran Nausea Only and Other (See Comments)    Dizziness and leg cramps, also   . Nortriptyline Nausea And Vomiting and Other (See Comments)    GERD, also  . Topiramate Swelling and Other (See Comments)    Dizziness and slurred speech, also  . Decadron [Dexamethasone] Other (See Comments)    Flushing, dizziness, and the patient PASSED OUT  . Doxycycline Nausea Only  . Percocet [Oxycodone-Acetaminophen] Nausea Only    Level of Care/Admitting Diagnosis ED Disposition    ED Disposition Condition Comment   Admit  Hospital Area: [facility] [[id number]]  Level of Care: ICU [6]  Covid Evaluation: Asymptomatic Screening Protocol (No Symptoms)  Diagnosis: Stroke (cerebrum) [facility]) [[id number]]   Admitting Physician: Caryl Pina [phone]  Attending Physician: Otelia Limes, ERIC Valen.Docker  Estimated length of stay: 5 - 7 days  Certification:: I certify this patient will need inpatient services for at least 2 midnights  PT Class (Do Not Modify): Inpatient [101]  PT Acc Code (Do Not Modify): Private [1]       B Medical/Surgery History Past Medical History:  Diagnosis Date  . Anxiety   . Arthritis    both knees  . Body aches 12/11/2014  . Chronic insomnia   . Chronic low back pain 05/29/2015  . Common migraine 12/19/2014  . Depression 12/11/2014  . Fibrocystic disease of both breasts   . Fibromyalgia 12/19/2014  . GERD (gastroesophageal reflux disease)   . History of hiatal hernia   . IBS (irritable bowel syndrome)   . Knee pain, right   . Migraines   . Rebound headache 12/19/2014  . Right ovarian cyst 04/25/2017   Complex, check CA 125 and repeat US in 6 weeks   . Sciatica of left side   . Seizures (HCC)    remote past, felt it was related to Chantix   . Vitamin D deficiency   . Wears glasses    Past Surgical History:  Procedure Laterality Date  . BILATERAL SALPINGECTOMY Bilateral 06/21/2017   Procedure: BILATERAL SALPINGECTOMY;  Surgeon: Tilda Burrow, MD;  Location: [facility];  Service: Gynecology;  Laterality: Bilateral;  . CESAREAN SECTION  [phone]  . CHOLECYSTECTOMY  2001  . COLONOSCOPY WITH PROPOFOL N/A 04/07/2017  Dr. Lovena Neighboursoarke: Normal, abnormal CT likely artifactual  . ENDOMETRIAL ABLATION    . ESOPHAGOGASTRODUODENOSCOPY  2002   [facility]: normal  . KNEE ARTHROSCOPY WITH LATERAL RELEASE Right 04/18/2018   Procedure: Right knee arthroscopy with chondroplasty and lateral release;  Surgeon: Yolonda Kidaogers, Jason Patrick, MD;  [facility];  Service: Orthopedics;  Laterality: Right;  60 mins  . SUPRACERVICAL ABDOMINAL HYSTERECTOMY N/A 06/21/2017   Procedure: HYSTERECTOMY SUPRACERVICAL ABDOMINAL;  Surgeon: Tilda BurrowFerguson, John V, MD;  Location: [facility];   Service: Gynecology;  Laterality: N/A;  . TUBAL LIGATION       A IV Location/Drains/Wounds Patient Lines/Drains/Airways Status   Active Line/Drains/Airways    Name:   Placement date:   Placement time:   Site:   Days:   Peripheral IV 05/03/19 Right Wrist   05/03/19    1320    Wrist   less than 1   Peripheral IV 05/03/19 Left Antecubital   05/03/19    1329    Antecubital   less than 1          Intake/Output Last 24 hours  Intake/Output Summary (Last 24 hours) at 05/03/2019 1809 Last data filed at 05/03/2019 1428 Gross per 24 hour  Intake 87.4 ml  Output -  Net 87.4 ml    Labs/Imaging Results for orders placed or performed during the hospital encounter of 05/03/19 (from the past 48 hour(s))  CBG monitoring, ED     Status: None   Collection Time: 05/03/19 12:21 PM  Result Value Ref Range   Glucose-Capillary 89 70 - 99 mg/dL  Ethanol     Status: None   Collection Time: 05/03/19 12:27 PM  Result Value Ref Range   Alcohol, Ethyl (B) <10 <10 mg/dL    Comment: (NOTE) Lowest detectable limit for serum alcohol is 10 mg/dL. For medical purposes only. Performed at [facility], [address] [place], [place] [address]   Protime-INR     Status: None   Collection Time: 05/03/19 12:27 PM  Result Value Ref Range   Prothrombin Time 12.8 11.4 - 15.2 seconds   INR 1.0 0.8 - 1.2    Comment: (NOTE) INR goal varies based on device and disease states. Performed at [facility], [address] [place], [place] [address]   APTT     Status: None   Collection Time: 05/03/19 12:27 PM  Result Value Ref Range   aPTT 25 24 - 36 seconds    Comment: Performed at [facility], [address] [place], [place] [address]  CBC     Status: Abnormal   Collection Time: 05/03/19 12:27 PM  Result Value Ref Range   WBC 5.3 4.0 - 10.5 K/uL   RBC 3.72 (L) 3.87 - 5.11 MIL/uL   Hemoglobin 11.4 (L) 12.0 - 15.0 g/dL   HCT 91.435.8 (L) 78.236.0 - 95.646.0 %   MCV 96.2 80.0 - 100.0 fL   MCH 30.6 26.0 - 34.0 pg   MCHC  31.8 30.0 - 36.0 g/dL   RDW 21.313.4 08.611.5 - 57.815.5 %   Platelets 115 (L) 150 - 400 K/uL    Comment: SPECIMEN CHECKED FOR CLOTS Immature Platelet Fraction may be clinically indicated, consider ordering this additional test [id number] FEW PLATELET CLUMPS SEEN ON SMEAR    nRBC 0.0 0.0 - 0.2 %    Comment: Performed at [facility], [address] [place], [place] [address]  Differential     Status: None   Collection Time: 05/03/19 12:27 PM  Result  Value Ref Range   Neutrophils Relative % 75 %   Neutro Abs 3.9 1.7 - 7.7 K/uL   Lymphocytes Relative 18 %   Lymphs Abs 1.0 0.7 - 4.0 K/uL   Monocytes Relative 5 %   Monocytes Absolute 0.3 0.1 - 1.0 K/uL   Eosinophils Relative 2 %   Eosinophils Absolute 0.1 0.0 - 0.5 K/uL   Basophils Relative 0 %   Basophils Absolute 0.0 0.0 - 0.1 K/uL   Immature Granulocytes 0 %   Abs Immature Granulocytes 0.02 0.00 - 0.07 K/uL    Comment: Performed at [facility], [address] [place], [place] [address]  Comprehensive metabolic panel     Status: None   Collection Time: 05/03/19 12:27 PM  Result Value Ref Range   Sodium 138 135 - 145 mmol/L   Potassium 3.9 3.5 - 5.1 mmol/L   Chloride 100 98 - 111 mmol/L   CO2 24 22 - 32 mmol/L   Glucose, Bld 84 70 - 99 mg/dL   BUN 9 6 - 20 mg/dL   Creatinine, Ser 6.210.69 0.44 - 1.00 mg/dL   Calcium 9.4 8.9 - 30.810.3 mg/dL   Total Protein 7.2 6.5 - 8.1 g/dL   Albumin 4.0 3.5 - 5.0 g/dL   AST 19 15 - 41 U/L   ALT 24 0 - 44 U/L   Alkaline Phosphatase 66 38 - 126 U/L   Total Bilirubin 0.4 0.3 - 1.2 mg/dL   GFR calc non Af Amer >60 >60 mL/min   GFR calc Af Amer >60 >60 mL/min   Anion gap 14 5 - 15    Comment: Performed at [facility], [address] [place], [place] [address]  I-stat chem 8, ED     Status: None   Collection Time: 05/03/19 12:43 PM  Result Value Ref Range   Sodium 136 135 - 145 mmol/L   Potassium 4.7 3.5 - 5.1 mmol/L   Chloride 102 98 - 111 mmol/L   BUN 10 6 - 20 mg/dL   Creatinine, Ser 6.960.70 0.44 - 1.00  mg/dL   Glucose, Bld 83 70 - 99 mg/dL   Calcium, Ion 2.951.16 2.841.15 - 1.40 mmol/L   TCO2 27 22 - 32 mmol/L   Hemoglobin 14.3 12.0 - 15.0 g/dL   HCT 13.242.0 44.036.0 - 10.246.0 %  SARS Coronavirus 2 (CEPHEID - Performed in [facility] hospital lab), Hosp Order     Status: None   Collection Time: 05/03/19  2:00 PM   Specimen: Nasopharyngeal Swab  Result Value Ref Range   SARS Coronavirus 2 NEGATIVE NEGATIVE    Comment: (NOTE) If result is NEGATIVE SARS-CoV-2 target nucleic acids are NOT DETECTED. The SARS-CoV-2 RNA is generally detectable in upper and lower  respiratory specimens during the acute phase of infection. The lowest  concentration of SARS-CoV-2 viral copies this assay can detect is 250  copies / mL. A negative result does not preclude SARS-CoV-2 infection  and should not be used as the sole basis for treatment or other  patient management decisions.  A negative result may occur with  improper specimen collection / handling, submission of specimen other  than nasopharyngeal swab, presence of viral mutation(s) within the  areas targeted by this assay, and inadequate number of viral copies  (<250 copies / mL). A negative result must be combined with clinical  observations, patient history, and epidemiological information. If result is POSITIVE SARS-CoV-2 target nucleic acids are DETECTED. The SARS-CoV-2 RNA is generally detectable in upper and  lower  respiratory specimens dur ing the acute phase of infection.  Positive  results are indicative of active infection with SARS-CoV-2.  Clinical  correlation with patient history and other diagnostic information is  necessary to determine patient infection status.  Positive results do  not rule out bacterial infection or co-infection with other viruses. If result is PRESUMPTIVE POSTIVE SARS-CoV-2 nucleic acids MAY BE PRESENT.   A presumptive positive result was obtained on the submitted specimen  and confirmed on repeat testing.  While 2019 novel  coronavirus  (SARS-CoV-2) nucleic acids may be present in the submitted sample  additional confirmatory testing may be necessary for epidemiological  and / or clinical management purposes  to differentiate between  SARS-CoV-2 and other Sarbecovirus currently known to infect humans.  If clinically indicated additional testing with an alternate test  methodology [phone]) is advised. The SARS-CoV-2 RNA is generally  detectable in upper and lower respiratory sp ecimens during the acute  phase of infection. The expected result is Negative. Fact Sheet for Patients:  BoilerBrush.com.cy Fact Sheet for Healthcare Providers: https://pope.com/ This test is not yet approved or cleared by the [place] FDA and has been authorized for detection and/or diagnosis of SARS-CoV-2 by FDA under an Emergency Use Authorization (EUA).  This EUA will remain in effect (meaning this test can be used) for the duration of the COVID-19 declaration under Section 564(b)(1) of the Act, 21 U.S.C. section 360bbb-3(b)(1), unless the authorization is terminated or revoked sooner. Performed at [facility], [address], [place], [place] [address]   Urine rapid drug screen (hosp performed)     Status: Abnormal   Collection Time: 05/03/19  3:58 PM  Result Value Ref Range   Opiates NONE DETECTED NONE DETECTED   Cocaine NONE DETECTED NONE DETECTED   Benzodiazepines POSITIVE (A) NONE DETECTED   Amphetamines NONE DETECTED NONE DETECTED   Tetrahydrocannabinol NONE DETECTED NONE DETECTED   Barbiturates NONE DETECTED NONE DETECTED    Comment: (NOTE) DRUG SCREEN FOR MEDICAL PURPOSES ONLY.  IF CONFIRMATION IS NEEDED FOR ANY PURPOSE, NOTIFY LAB WITHIN 5 DAYS. LOWEST DETECTABLE LIMITS FOR URINE DRUG SCREEN Drug Class                     Cutoff (ng/mL) Amphetamine and metabolites    1000 Barbiturate and metabolites    200 Benzodiazepine                 200 Tricyclics and  metabolites     300 Opiates and metabolites        300 Cocaine and metabolites        300 THC                            50 Performed at [facility], [address] [place], [place] [address]   Urinalysis, Routine w reflex microscopic     Status: Abnormal   Collection Time: 05/03/19  3:58 PM  Result Value Ref Range   Color, Urine STRAW (A) YELLOW   APPearance CLEAR CLEAR   Specific Gravity, Urine 1.036 (H) 1.005 - 1.030   pH 6.0 5.0 - 8.0   Glucose, UA NEGATIVE NEGATIVE mg/dL   Hgb urine dipstick NEGATIVE NEGATIVE   Bilirubin Urine NEGATIVE NEGATIVE   Ketones, ur NEGATIVE NEGATIVE mg/dL   Protein, ur NEGATIVE NEGATIVE mg/dL   Nitrite NEGATIVE NEGATIVE   Leukocytes,Ua TRACE (A) NEGATIVE   RBC / HPF 0-5  0 - 5 RBC/hpf   WBC, UA 0-5 0 - 5 WBC/hpf   Bacteria, UA NONE SEEN NONE SEEN   Squamous Epithelial / LPF 0-5 0 - 5    Comment: Performed at [facility], [place] [address], [place], [place] [address]   Ct Code Stroke Cta Head W/wo Contrast  Result Date: 05/03/2019 CLINICAL DATA:  Right-sided numbness.  Recent COVID-19 exposure. EXAM: CT ANGIOGRAPHY HEAD AND NECK TECHNIQUE: Multidetector CT imaging of the head and neck was performed using the standard protocol during bolus administration of intravenous contrast. Multiplanar CT image reconstructions and MIPs were obtained to evaluate the vascular anatomy. Carotid stenosis measurements (when applicable) are obtained utilizing NASCET criteria, using the distal internal carotid diameter as the denominator. CONTRAST:  49mL OMNIPAQUE IOHEXOL 350 MG/ML SOLN COMPARISON:  05/03/2019 FINDINGS: CTA NECK FINDINGS SKELETON: There is no bony spinal canal stenosis. No lytic or blastic lesion. OTHER NECK: Normal pharynx, larynx and major salivary glands. No cervical lymphadenopathy. Unremarkable thyroid gland. UPPER CHEST: No pneumothorax or pleural effusion. No nodules or masses. AORTIC ARCH: There is no calcific atherosclerosis of the aortic  arch. There is no aneurysm, dissection or hemodynamically significant stenosis of the visualized ascending aorta and aortic arch. Conventional 3 vessel aortic branching pattern. The visualized proximal subclavian arteries are widely patent. RIGHT CAROTID SYSTEM: --Common carotid artery: Widely patent origin without common carotid artery dissection or aneurysm. --Internal carotid artery: Normal without aneurysm, dissection or stenosis. --External carotid artery: No acute abnormality. LEFT CAROTID SYSTEM: --Common carotid artery: Widely patent origin without common carotid artery dissection or aneurysm. --Internal carotid artery: Normal without aneurysm, dissection or stenosis. --External carotid artery: No acute abnormality. VERTEBRAL ARTERIES: Left dominant configuration. Both origins are clearly patent. No dissection, occlusion or flow-limiting stenosis to the skull base (V1-V3 segments). CTA HEAD FINDINGS POSTERIOR CIRCULATION: --Vertebral arteries: Normal V4 segments. --Posterior inferior cerebellar arteries (PICA): Patent origins from the vertebral arteries. --Anterior inferior cerebellar arteries (AICA): Patent origins from the basilar artery. --Basilar artery: Normal. --Superior cerebellar arteries: Normal. --Posterior cerebral arteries (PCA): Normal. The right PCA is partially supplied by a posterior communicating artery (p-comm). ANTERIOR CIRCULATION: --Intracranial internal carotid arteries: Normal. --Anterior cerebral arteries (ACA): Normal. Both A1 segments are present. Patent anterior communicating artery (a-comm). --Middle cerebral arteries (MCA): Normal. VENOUS SINUSES: As permitted by contrast timing, patent. ANATOMIC VARIANTS: None Review of the MIP images confirms the above findings. IMPRESSION: Normal CTA of the head and neck. Electronically Signed   By: Ulyses Jarred M.D.   On: 05/03/2019 14:15   Ct Code Stroke Cta Neck W/wo Contrast  Result Date: 05/03/2019 CLINICAL DATA:  Right-sided  numbness.  Recent COVID-19 exposure. EXAM: CT ANGIOGRAPHY HEAD AND NECK TECHNIQUE: Multidetector CT imaging of the head and neck was performed using the standard protocol during bolus administration of intravenous contrast. Multiplanar CT image reconstructions and MIPs were obtained to evaluate the vascular anatomy. Carotid stenosis measurements (when applicable) are obtained utilizing NASCET criteria, using the distal internal carotid diameter as the denominator. CONTRAST:  54mL OMNIPAQUE IOHEXOL 350 MG/ML SOLN COMPARISON:  05/03/2019 FINDINGS: CTA NECK FINDINGS SKELETON: There is no bony spinal canal stenosis. No lytic or blastic lesion. OTHER NECK: Normal pharynx, larynx and major salivary glands. No cervical lymphadenopathy. Unremarkable thyroid gland. UPPER CHEST: No pneumothorax or pleural effusion. No nodules or masses. AORTIC ARCH: There is no calcific atherosclerosis of the aortic arch. There is no aneurysm, dissection or hemodynamically significant stenosis of the visualized ascending aorta and aortic arch. Conventional  3 vessel aortic branching pattern. The visualized proximal subclavian arteries are widely patent. RIGHT CAROTID SYSTEM: --Common carotid artery: Widely patent origin without common carotid artery dissection or aneurysm. --Internal carotid artery: Normal without aneurysm, dissection or stenosis. --External carotid artery: No acute abnormality. LEFT CAROTID SYSTEM: --Common carotid artery: Widely patent origin without common carotid artery dissection or aneurysm. --Internal carotid artery: Normal without aneurysm, dissection or stenosis. --External carotid artery: No acute abnormality. VERTEBRAL ARTERIES: Left dominant configuration. Both origins are clearly patent. No dissection, occlusion or flow-limiting stenosis to the skull base (V1-V3 segments). CTA HEAD FINDINGS POSTERIOR CIRCULATION: --Vertebral arteries: Normal V4 segments. --Posterior inferior cerebellar arteries (PICA): Patent  origins from the vertebral arteries. --Anterior inferior cerebellar arteries (AICA): Patent origins from the basilar artery. --Basilar artery: Normal. --Superior cerebellar arteries: Normal. --Posterior cerebral arteries (PCA): Normal. The right PCA is partially supplied by a posterior communicating artery (p-comm). ANTERIOR CIRCULATION: --Intracranial internal carotid arteries: Normal. --Anterior cerebral arteries (ACA): Normal. Both A1 segments are present. Patent anterior communicating artery (a-comm). --Middle cerebral arteries (MCA): Normal. VENOUS SINUSES: As permitted by contrast timing, patent. ANATOMIC VARIANTS: None Review of the MIP images confirms the above findings. IMPRESSION: Normal CTA of the head and neck. Electronically Signed   By: Deatra RobinsonKevin  Herman M.D.   On: 05/03/2019 14:15   Ct Head Code Stroke Wo Contrast  Result Date: 05/03/2019 CLINICAL DATA:  Code stroke.  Numbness and weakness right side EXAM: CT HEAD WITHOUT CONTRAST TECHNIQUE: Contiguous axial images were obtained from the base of the skull through the vertex without intravenous contrast. COMPARISON:  CT head 11/25/2016 FINDINGS: Brain: No evidence of acute infarction, hemorrhage, hydrocephalus, extra-axial collection or mass lesion/mass effect. Vascular: Negative for hyperdense vessel Skull: Negative Sinuses/Orbits: Negative Other: None ASPECTS (Alberta Stroke Program Early CT Score) - Ganglionic level infarction (caudate, lentiform nuclei, internal capsule, insula, M1-M3 cortex): 7 - Supraganglionic infarction (M4-M6 cortex): 3 Total score (0-10 with 10 being normal): 10 IMPRESSION: 1. Negative CT head 2. ASPECTS is 10 3. These results were called by telephone at the time of interpretation on 05/03/2019 at 12:54 pm to Dr. Vanetta MuldersSCOTT ZACKOWSKI , who verbally acknowledged these results. Electronically Signed   By: Marlan Palauharles  Clark M.D.   On: 05/03/2019 12:55    Pending Labs Unresulted Labs (From admission, onward)    Start     Ordered    05/04/19 0500  Hemoglobin A1c  Tomorrow morning,   R     05/03/19 1801   05/04/19 0500  Lipid panel  Tomorrow morning,   R    Comments: Fasting    05/03/19 1801   05/03/19 1755  HIV antibody (Routine Testing)  Once,   STAT     05/03/19 1801          Vitals/Pain Today's Vitals   05/03/19 1345 05/03/19 1400 05/03/19 1457 05/03/19 1458  BP: 115/79 (!) 126/94    Pulse: 70 70    Resp: 15 19    Temp:      TempSrc:      SpO2: 99% 98% 97%   Weight:    97.1 kg  Height:    5\' 3"  (1.6 m)  PainSc:    0-No pain    Isolation Precautions No active isolations  Medications Medications  alteplase (ACTIVASE) 1 mg/mL infusion 87.4 mg (0 mg/kg  97.1 kg Intravenous Stopped 05/03/19 1428)    Followed by  0.9 %  sodium chloride infusion (has no administration in time range)   stroke: mapping our early  stages of recovery book (has no administration in time range)  0.9 %  sodium chloride infusion (has no administration in time range)  senna-docusate (Senokot-S) tablet 1 tablet (has no administration in time range)  pantoprazole (PROTONIX) injection 40 mg (has no administration in time range)  labetalol (NORMODYNE) injection 20 mg (has no administration in time range)    And  clevidipine (CLEVIPREX) infusion 0.5 mg/mL (has no administration in time range)  albuterol (PROVENTIL) (2.5 MG/3ML) 0.083% nebulizer solution 2.5 mg (has no administration in time range)  ALPRAZolam (XANAX) tablet 0.5 mg (has no administration in time range)  escitalopram (LEXAPRO) tablet 20 mg (has no administration in time range)  traZODone (DESYREL) tablet 50 mg (has no administration in time range)  iohexol (OMNIPAQUE) 350 MG/ML injection 100 mL (75 mLs Intravenous Contrast Given 05/03/19 1339)    Mobility walks High fall risk   Focused Assessments    R Recommendations: See Admitting Provider Note  Report given to: Maralyn Sago RN  Additional Notes:

## 2019-05-03 NOTE — ED Notes (Signed)
PT taken to CT scan at this time by this nurse. Attempts x2 for 18G IV in AC and unsessful. Blood work obtained by Engineer, civil (consulting) and walked to lab.

## 2019-05-03 NOTE — ED Notes (Addendum)
Bolus of alteplase completed [id number]  Dose 8.7 ml

## 2019-05-03 NOTE — ED Provider Notes (Signed)
[facility] EMERGENCY DEPARTMENT Provider Note   CSN: [id number] Arrival date & time: 05/03/19  1156    History   Chief Complaint Chief Complaint  Patient presents with   Code Stroke    HPI Mary Parker is a 38 y.o. female.     Patient 2 hours prior to arrival making it at 1015 patient had sudden onset of droop of the right side of the mouth weakness in the right arm and leg.  Also had some numbness.  When patient arrived it was 2 hours prior that it had onset.  Patient got up in the morning sitter thing was fine.  Patient does have a history of migraines and does have a headache with this but is not severe and she states is not like her migraines.  Is never had any history of complicated migraines.  Patient has had a COVID exposure at work.  Was tested outpatient but does not know the results yet.     Past Medical History:  Diagnosis Date   Anxiety    Arthritis    both knees   Body aches 12/11/2014   Chronic insomnia    Chronic low back pain 05/29/2015   Common migraine 12/19/2014   Depression 12/11/2014   Fibrocystic disease of both breasts    Fibromyalgia 12/19/2014   GERD (gastroesophageal reflux disease)    History of hiatal hernia    IBS (irritable bowel syndrome)    Knee pain, right    Migraines    Rebound headache 12/19/2014   Right ovarian cyst 04/25/2017   Complex, check CA 125 and repeat US in 6 weeks    Sciatica of left side    Seizures (Yell)    remote past, felt it was related to Chantix    Vitamin D deficiency    Wears glasses     Patient Active Problem List   Diagnosis Date Noted   Acute gastroenteritis 03/29/2019   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   S/P abdominal supracervical subtotal hysterectomy 06/21/2017   Pelvic adhesions 06/09/2017   History of ovarian cyst 06/09/2017   Right ovarian cyst 04/25/2017   Encounter for gynecological examination with Papanicolaou smear of cervix 04/19/2017   Rectal bleeding  04/02/2017   LLQ pain 04/01/2017   Constipation 04/01/2017   Concussion with loss of consciousness 11/16/2016   Anxiety 10/14/2016   Chronic low back pain 05/29/2015   Common migraine 12/19/2014   Fibromyalgia 12/19/2014   Rebound headache 12/19/2014   Depression 12/11/2014   Body aches 12/11/2014    Past Surgical History:  Procedure Laterality Date   BILATERAL SALPINGECTOMY Bilateral 06/21/2017   Procedure: BILATERAL SALPINGECTOMY;  Surgeon: Jonnie Kind, MD;  Location: [facility];  Service: Gynecology;  Laterality: Bilateral;   CESAREAN SECTION  [phone]   CHOLECYSTECTOMY  2001   COLONOSCOPY WITH PROPOFOL N/A 04/07/2017   Dr. Emerson Monte: Normal, abnormal CT likely artifactual   ENDOMETRIAL ABLATION     ESOPHAGOGASTRODUODENOSCOPY  2002   [facility]: normal   KNEE ARTHROSCOPY WITH LATERAL RELEASE Right 04/18/2018   Procedure: Right knee arthroscopy with chondroplasty and lateral release;  Surgeon: Nicholes Stairs, MD;  Location: [facility];  Service: Orthopedics;  Laterality: Right;  60 mins   SUPRACERVICAL ABDOMINAL HYSTERECTOMY N/A 06/21/2017   Procedure: HYSTERECTOMY SUPRACERVICAL ABDOMINAL;  Surgeon: Jonnie Kind, MD;  Location: [facility];  Service: Gynecology;  Laterality: N/A;   TUBAL LIGATION       OB History  Gravida  3   Para  3   Term  3   Preterm      AB      Living  3     SAB      TAB      Ectopic      Multiple      Live Births  3            Home Medications    Prior to Admission medications   Medication Sig Start Date End Date Taking? Authorizing Provider  albuterol (PROVENTIL) (2.5 MG/3ML) 0.083% nebulizer solution as needed.  02/21/18   [provider]  cyanocobalamin (,VITAMIN B-12,) 1000 MCG/ML injection INJECT 1 ML INTO THE SKIN ONCE A MONTH. 02/17/18   [provider]  dicyclomine (BENTYL) 10 MG capsule Take one up to three times a day for abdominal pain. Hold for  constipation. 03/29/19   Tiffany KocherLewis, Leslie S, PA-C  escitalopram (LEXAPRO) 20 MG tablet Take 20 mg by mouth daily. 10/31/17   [provider]  Fremanezumab-vfrm (AJOVY) 225 MG/1.5ML SOAJ Inject 225 mg into the skin every 30 (thirty) days. 03/05/19   York SpanielWillis, Charles K, MD  naproxen (NAPROSYN) 500 MG tablet Take 1 tablet (500 mg total) by mouth 2 (two) times daily. 04/24/19   Vanetta MuldersZackowski, Klever Twyford, MD  omeprazole (PRILOSEC OTC) 20 MG tablet Take 20 mg by mouth daily.    [provider]  ondansetron (ZOFRAN-ODT) 4 MG disintegrating tablet DISSOLVE 1 TABLET(4 MG) ON THE TONGUE EVERY 8 HOURS AS NEEDED FOR NAUSEA OR VOMITING 03/29/19   York SpanielWillis, Charles K, MD  tiZANidine (ZANAFLEX) 2 MG tablet TAKE 1 TABLET BY MOUTH TWICE DAILY THEN TAKE 2 TABLETS BY MOUTH EVERY EVENING AS NEEDED Patient taking differently: at bedtime. TAKE 1 TABLET BY MOUTH TWICE DAILY THEN TAKE 2 TABLETS BY MOUTH EVERY EVENING AS NEEDED 02/19/19   York SpanielWillis, Charles K, MD  traZODone (DESYREL) 50 MG tablet Take 50 mg by mouth at bedtime.    [provider]  varenicline (CHANTIX) 1 MG tablet Take 1 mg by mouth 2 (two) times a day.     [provider]  Vitamin D, Ergocalciferol, (DRISDOL) 50000 units CAPS capsule every 7 (seven) days.  02/18/18   [provider]    Family History Family History  Problem Relation Age of Onset   Hypertension Mother    Other Mother        abnormal cells; had hyst   Obesity Daughter    Diabetes Daughter [id number]       youngest   Obesity Son    Cancer Maternal Grandmother    Osteoporosis Maternal Grandmother    Cancer Maternal Grandfather    Obesity Daughter    Diabetes Father    Hypertension Father    Alcohol abuse Father    Colon cancer Neg Hx     Social History Social History   Tobacco Use   Smoking status: Current Every Day Smoker    Packs/day: 0.25    Years: 25.00    Pack years: 6.25    Types: Cigarettes   Smokeless tobacco: Never Used   Tobacco  comment: pt down to 3 cigarettes/d  Substance Use Topics   Alcohol use: No   Drug use: No     Allergies   Wellbutrin [bupropion], Ciprofloxacin, Cymbalta [duloxetine hcl], Meloxicam, Savella [milnacipran], Topamax [topiramate], Decadron [dexamethasone], Hydrocodone-acetaminophen, Pristiq [desvenlafaxine succinate er], Doxycycline, Gabapentin, Lyrica [pregabalin], Nortriptyline, and Percocet [oxycodone-acetaminophen]   Review of Systems Review of Systems  Constitutional: Negative for chills and fever.  HENT: Negative for congestion, rhinorrhea and sore throat.   Eyes: Negative for visual disturbance.  Respiratory: Negative for cough and shortness of breath.   Cardiovascular: Negative for chest pain and leg swelling.  Gastrointestinal: Negative for abdominal pain, diarrhea, nausea and vomiting.  Genitourinary: Negative for dysuria.  Musculoskeletal: Negative for back pain and neck pain.  Skin: Negative for rash.  Neurological: Positive for facial asymmetry, weakness, numbness and headaches. Negative for dizziness and light-headedness.  Hematological: Does not bruise/bleed easily.  Psychiatric/Behavioral: Negative for confusion.     Physical Exam Updated Vital Signs BP (!) 126/94    Pulse 70    Temp 97.6 F (36.4 C) (Oral)    Resp 19    Ht 1.575 m ( )    Wt 97.1 kg    SpO2 98%    BMI 39.16 kg/m   Physical Exam Vitals signs and nursing note reviewed.  Constitutional:      General: She is not in acute distress.    Appearance: She is well-developed.  HENT:     Head: Normocephalic and atraumatic.  Eyes:     Extraocular Movements: Extraocular movements intact.     Conjunctiva/sclera: Conjunctivae normal.     Pupils: Pupils are equal, round, and reactive to light.  Neck:     Musculoskeletal: Normal range of motion and neck supple.  Cardiovascular:     Rate and Rhythm: Normal rate and regular rhythm.     Heart sounds: No murmur.  Pulmonary:     Effort: Pulmonary effort  is normal. No respiratory distress.     Breath sounds: Normal breath sounds.  Abdominal:     Palpations: Abdomen is soft.     Tenderness: There is no abdominal tenderness.  Musculoskeletal: Normal range of motion.        General: No swelling.  Skin:    General: Skin is warm and dry.     Capillary Refill: Capillary refill takes less than 2 seconds.  Neurological:     Mental Status: She is alert.     Cranial Nerves: Cranial nerve deficit present.     Sensory: Sensory deficit present.     Motor: Weakness present.     Comments: Patient with right-sided facial droop.  Patient with weakness to right arm and leg leg greater than arm.  Arm also had a drift.  Shortly after doing this patient's leg strength seemed to improve.  And still had weakness in the right upper extremity.  Facial droop persisted.      ED Treatments / Results  Labs (all labs ordered are listed, but only abnormal results are displayed) Labs Reviewed  CBC - Abnormal; Notable for the following components:      Result Value   RBC 3.72 (*)    Hemoglobin 11.4 (*)    HCT 35.8 (*)    Platelets 115 (*)    All other components within normal limits  SARS CORONAVIRUS 2 (HOSPITAL ORDER, PERFORMED IN  HOSPITAL LAB)  ETHANOL  PROTIME-INR  APTT  DIFFERENTIAL  COMPREHENSIVE METABOLIC PANEL  RAPID URINE DRUG SCREEN, HOSP PERFORMED  URINALYSIS, ROUTINE W REFLEX MICROSCOPIC  CBG MONITORING, ED  I-STAT CHEM 8, ED  POC URINE PREG, ED  POC URINE PREG, ED    EKG None  Radiology Ct Head Code Stroke Wo Contrast  Result Date: 05/03/2019 CLINICAL DATA:  Code stroke.  Numbness and weakness right side EXAM: CT HEAD WITHOUT CONTRAST TECHNIQUE: Contiguous axial images were  obtained from the base of the skull through the vertex without intravenous contrast. COMPARISON:  CT head 11/25/2016 FINDINGS: Brain: No evidence of acute infarction, hemorrhage, hydrocephalus, extra-axial collection or mass lesion/mass effect. Vascular:  Negative for hyperdense vessel Skull: Negative Sinuses/Orbits: Negative Other: None ASPECTS (Alberta Stroke Program Early CT Score) - Ganglionic level infarction (caudate, lentiform nuclei, internal capsule, insula, M1-M3 cortex): 7 - Supraganglionic infarction (M4-M6 cortex): 3 Total score (0-10 with 10 being normal): 10 IMPRESSION: 1. Negative CT head 2. ASPECTS is 10 3. These results were called by telephone at the time of interpretation on 05/03/2019 at 12:54 pm to Dr. Vanetta MuldersSCOTT Malikah Lakey , who verbally acknowledged these results. Electronically Signed   By: Marlan Palauharles  Clark M.D.   On: 05/03/2019 12:55    Procedures Procedures (including critical care time)  CRITICAL CARE Performed by: Vanetta MuldersScott Christapher Gillian Total critical care time: 30 minutes Critical care time was exclusive of separately billable procedures and treating other patients. Critical care was necessary to treat or prevent imminent or life-threatening deterioration. Critical care was time spent personally by me on the following activities: development of treatment plan with patient and/or surrogate as well as nursing, discussions with consultants, evaluation of patient's response to treatment, examination of patient, obtaining history from patient or surrogate, ordering and performing treatments and interventions, ordering and review of laboratory studies, ordering and review of radiographic studies, pulse oximetry and re-evaluation of patient's condition.   Medications Ordered in ED Medications  alteplase (ACTIVASE) 1 mg/mL infusion 87.4 mg (87.4 mg Intravenous New Bag/Given 05/03/19 1328)    Followed by  0.9 %  sodium chloride infusion (has no administration in time range)  iohexol (OMNIPAQUE) 350 MG/ML injection 100 mL (75 mLs Intravenous Contrast Given 05/03/19 1339)     Initial Impression / Assessment and Plan / ED Course  I have reviewed the triage vital signs and the nursing notes.  Pertinent labs & imaging results that were available  during my care of the patient were reviewed by me and considered in my medical decision making (see chart for details).       Patient symptoms could be consistent with a stroke or possibly complicated migraine.  Due to the fact that the symptoms started 2 hours ago and patient felt it was not migraine related.  Called code stroke.  Patient's head CT was negative.  Telemedicine interviewed patient here felt that she had legitimate facial droop and weakness on the right side.  They thought her upper extremity greater than lower extremity.  They recommended TPA.  Patient agreed to TPA.  TPA was started.  Then contacted tele-neurologist [facility] Ot[facility]Lindzen [facility] and [facility] but they stated they had no ICU bed so they could not accept her.  Wanted me to work on transferring [facility] to [facility].  In discussion with our charge nurse here and also from my memory that they have no choice but to take a minute would be an ED transfer.  That was confirmed by administration here.  [facility] recontacted to transfer the patient.  In the meantime patient went to get CT angios head and neck results are pending.  Patient also had COVID testing.  [facility] did not have a truck available.  [facility] since there is no admitting bed at this point in time.  I contacted Trudie BucklerMarcy Pfeifer and she will [facility].  Addendum: Patient after  receiving the TPA is actually showing signs of improvement.  Much better strength in arms and legs.  And the facial weakness is improving as well.   Final Clinical Impressions(s) / ED Diagnoses   Final diagnoses:  Cerebrovascular accident (CVA), unspecified mechanism [facility](HCC)    ED Discharge Orders    None       Vanetta MuldersZackowski, Anabella Capshaw, MD 05/03/19 1414

## 2019-05-03 NOTE — ED Notes (Signed)
Pt left with transport to [facility]

## 2019-05-03 NOTE — ED Notes (Signed)
EMS in to transport 

## 2019-05-03 NOTE — ED Triage Notes (Signed)
Patient complaining of numbness and weakness to right side of face, right arm, and right leg starting 2 hours ago. States she was also exposed to positive covid coworker and is waiting for results of covid test.

## 2019-05-03 NOTE — ED Notes (Signed)
Pt very difficult stick. Multiple times to obtain site. [place] required to obtain site

## 2019-05-03 NOTE — ED Provider Notes (Signed)
Pt arrived from [facility] ED after presenting with R sided weakness, per neuro team was instructed to receive tPA which was administered PTA.  On exam here, she is awake and alert, comfortable, pleasant, fluent speech and stable vital signs.  Face is symmetric.  She was evaluated by neurology team, Etta Quill and Dr. Cheral Marker. They will admit for further care.   , Wenda Overland, MD 05/03/19 1534

## 2019-05-03 NOTE — Consult Note (Signed)
[facility] Tele[facility]urology Consult Services   Date of Service:   05/03/2019 12:44:25  Impression:     .  Rule Out Acute Ischemic Stroke     .  Acute right arm weakness and right hemibody sensory loss.  Comments/Sign-Out: 38 year old female presents with acute right facial, arm an leg weakness. PMH: migraines but no previous history of complex migraines. Recent possible COVID exposure. Not on anticoagulation. NIHSS 4: mild right arm weakness and right hemibody sensory loss.  Mechanism of Stroke: Possible Thromboembolic Possible Cardioembolic Small Vessel Disease Possible complicated migraine.  Metrics: Last Known Well: 05/03/2019 10:00:49 [facility] Notification Time: 05/03/2019 12:44:25 Arrival Time: 05/03/2019 11:56:54 Stamp Time: 05/03/2019 12:44:25 Time First Login Attempt: 05/03/2019 12:47:47 Video Start Time: 05/03/2019 12:47:47  Symptoms: acute right facial, arm an leg weakness. NIHSS Start Assessment Time: 05/03/2019 12:50:38 tPA Verbal Order Time: 05/03/2019 13:06:40 Patient is a candidate for tPA. tPA CPOE Order Time: 05/03/2019 13:08:26 Needle Time: 05/03/2019 13:27:10 Weight Noted by Staff: 214.1 lbs Reason for tPA Delay: Medical decision making  CT head showed no acute hemorrhage or acute core infarct.  Lower Likelihood of Large Vessel Occlusion but Following Stat Studies are Recommended  CTA Head and Neck: no LVO.   ED Physician notified of diagnostic impression and management plan on 05/03/2019 13:00:48  Recommendations: IV tPA recommended. Verbal consent to tPA from patient.   IV tPA Total Dose - 87.4 mg IV tPA Bolus Dose - 8.7 mg IV tPA Infusion Dose - 78.6 mg  Routine post tPA monitoring including neuro checks and blood pressure control during/after treatment Monitor blood pressure Check blood pressure and NIHSS every 15 min for 2 h, then every 30 min for 6 h, and finally every hour for 16 h.  Manage Blood Pressure per  post tPA protocol.      .  Admission to ICU     .  CT brain 24 hours post tPA     .  NPO until swallowing screen performed and passed     .  No antiplatelet agents or anticoagulants (including heparin for DVT prophylaxis) in first 24 hours     .  No Foley catheter, nasogastric tube, arterial catheter or central venous catheter for 24 hr, unless absolutely necessary     .  Telemetry     .  Bedside swallow evaluation     .  HOB less than 30 degrees     .  Euglycemia     .  Avoid hyperthermia, PRN acetaminophen     .  DVT prophylaxis     .  Inpatient Neurology Consultation     .  Stroke evaluation as per inpatient neurology recommendations  Discussed with ED physician    ------------------------------------------------------------------------------  History of Present Illness: Patient is a 38 year old Female.  Patient was brought by private transportation with symptoms of acute right facial, arm an leg weakness.  38 year old female presents with acute right facial, arm an leg weakness. PMH: migraines but no previous history of complex migraines. Recent possible COVID exposure. Not on anticoagulation.  Last seen normal was within 4.5 hours. There is no history of hemorrhagic complications or intracranial hemorrhage. There is no history of Recent Anticoagulants. There is no history of recent major surgery. There is no history of recent stroke.  Examination: BP(126/91), Blood Glucose(89) 1A: Level of Consciousness - Alert; keenly responsive + 0 1B: Ask Month and Age - Both Questions Right + 0 1C: Blink Eyes & Squeeze  Hands - Performs Both Tasks + 0 2: Test Horizontal Extraocular Movements - Normal + 0 3: Test Visual Fields - No Visual Loss + 0 4: Test Facial Palsy (Use Grimace if Obtunded) - Partial paralysis (lower face) + 2 5A: Test Left Arm Motor Drift - No Drift for 10 Seconds + 0 5B: Test Right Arm Motor Drift - Drift, but doesn't hit bed + 1 6A: Test Left Leg Motor Drift  - No Drift for 5 Seconds + 0 6B: Test Right Leg Motor Drift - No Drift for 5 Seconds + 0 7: Test Limb Ataxia (FNF/Heel-Shin) - No Ataxia + 0 8: Test Sensation - Mild-Moderate Loss: Less Sharp/More Dull + 1 9: Test Language/Aphasia - Normal; No aphasia + 0 10: Test Dysarthria - Normal + 0 11: Test Extinction/Inattention - No abnormality + 0  NIHSS Score: 4  Patient/Family was informed the Neurology Consult would happen via TeleHealth consult by way of interactive audio and video telecommunications and consented to receiving care in this manner.  Due to the immediate potential for life-threatening deterioration due to underlying acute neurologic illness, I spent 55 minutes providing critical care. This time includes time for face to face visit via telemedicine, review of medical records, imaging studies and discussion of findings with providers, the patient and/or family.   Dr Martina SinnerMark Natalina Wieting   [facility] [phone]  Case [id number]

## 2019-05-03 NOTE — ED Notes (Signed)
Report given to Roselyn Reef Hydrographic surveyor at [employer]) at this time. PT left via [facility] at this time with ER nurse Eboni in ambulance with pt.

## 2019-05-03 NOTE — H&P (Addendum)
Neurology history and physical    CC: Right facial numbness that then progressed in a marching sequence down the right side of her body  History is obtained from: Patient  HPI: Mary Parker is a 38 y.o. female vitamin D deficiency, seizures, rebound headache, migraines, IBS, fibromyalgia, low back pain, insomnia.  Patient arrived at [facility] emergency department due to sudden onset of right facial droop followed by paresthesias and weakness of the right arm and leg at approximately 0815.  She then states that after the symptom  Onset she did have a throbbing headache that was 8/10.  At present time she states that her headache has significantly improved to 3/10 pain and her sensory symptoms have improved completely.  She states that she has migraine headaches, sometimes with visual aura, but that she has never had a migraine complicated with neurological issues. Of note, she is prone to having multiple migraines.  Tele-neurology was consulted at the OSH; it was felt that the likelihood of a stroke was high enough that tPA should be considered, which the patient consented to. Patient was then [facility] ED for further evaluation after having a CTA head/neck at [facility] which was negative. The patient currently has a slight headache in her right occipital region; other than that, she feels that all of her symptoms have resolved  LKW: 0815 on 05/03/2019 tpa given?:  Yes Premorbid modified Rankin scale (mRS): 0 NIH stroke scale of 1  ROS: A 14 point ROS was performed and is negative except as noted in the HPI.   Past Medical History:  Diagnosis Date  . Anxiety   . Arthritis    both knees  . Body aches 12/11/2014  . Chronic insomnia   . Chronic low back pain 05/29/2015  . Common migraine 12/19/2014  . Depression 12/11/2014  . Fibrocystic disease of both breasts   . Fibromyalgia 12/19/2014  . GERD (gastroesophageal reflux disease)   . History of hiatal hernia   . IBS (irritable bowel  syndrome)   . Knee pain, right   . Migraines   . Rebound headache 12/19/2014  . Right ovarian cyst 04/25/2017   Complex, check CA 125 and repeat US in 6 weeks   . Sciatica of left side   . Seizures (HCC)    remote past, felt it was related to Chantix   . Vitamin D deficiency   . Wears glasses     Family History  Problem Relation Age of Onset  . Hypertension Mother   . Other Mother        abnormal cells; had hyst  . Obesity Daughter   . Diabetes Daughter [id number]       youngest  . Obesity Son   . Cancer Maternal Grandmother   . Osteoporosis Maternal Grandmother   . Cancer Maternal Grandfather   . Obesity Daughter   . Diabetes Father   . Hypertension Father   . Alcohol abuse Father   . Colon cancer Neg Hx     Social History:   reports that she has been smoking cigarettes. She has a 6.25 pack-year smoking history. She has never used smokeless tobacco. She reports that she does not drink alcohol or use drugs.  Medications  Current Facility-Administered Medications:  .  [COMPLETED] alteplase (ACTIVASE) 1 mg/mL infusion 87.4 mg, 0.9 mg/kg, Intravenous, Once, Stopped at 05/03/19 1428 **FOLLOWED BY** 0.9 %  sodium chloride infusion, 50 mL, Intravenous, Once, Vanetta MuldersZackowski, Scott, MD  Current Outpatient Medications:  .  acetaminophen (TYLENOL) 500 MG tablet, Take 500-1,000 mg by mouth every 6 (six) hours as needed for mild pain or headache., Disp: , Rfl:  .  albuterol (PROVENTIL) (2.5 MG/3ML) 0.083% nebulizer solution, Take 2.5 mg by nebulization every 6 (six) hours as needed for wheezing or shortness of breath. , Disp: , Rfl: 2 .  ALPRAZolam (XANAX) 0.5 MG tablet, Take 0.5 mg by mouth at bedtime as needed for sleep. , Disp: , Rfl:  .  budesonide-formoterol (SYMBICORT) 160-4.5 MCG/ACT inhaler, Inhale 2 puffs into the lungs 2 (two) times daily as needed (for flares). , Disp: , Rfl:  .  cyanocobalamin (,VITAMIN B-12,) 1000 MCG/ML injection, Inject 1,000 mcg into the skin every 30 (thirty) days.  , Disp: , Rfl: 0 .  dicyclomine (BENTYL) 10 MG capsule, Take one up to three times a day for abdominal pain. Hold for constipation. (Patient taking differently: Take 10 mg by mouth 3 (three) times daily as needed for spasms (or abdominal pain and HOLD FOR CONSTIPATION). ), Disp: 30 capsule, Rfl: 0 .  diphenhydrAMINE (BENADRYL) 25 mg capsule, Take 25 mg by mouth every 6 (six) hours as needed for itching, allergies or sleep., Disp: , Rfl:  .  escitalopram (LEXAPRO) 20 MG tablet, Take 20 mg by mouth at bedtime. , Disp: , Rfl: 0 .  Fremanezumab-vfrm (AJOVY) 225 MG/1.5ML SOAJ, Inject 225 mg into the skin every 30 (thirty) days., Disp: 1 pen, Rfl: 3 .  naproxen (NAPROSYN) 500 MG tablet, Take 1 tablet (500 mg total) by mouth 2 (two) times daily. (Patient taking differently: Take 500 mg by mouth 2 (two) times daily as needed for mild pain or moderate pain. ), Disp: 14 tablet, Rfl: 0 .  omeprazole (PRILOSEC) 20 MG capsule, Take 20 mg by mouth at bedtime. , Disp: , Rfl:  .  ondansetron (ZOFRAN-ODT) 4 MG disintegrating tablet, DISSOLVE 1 TABLET(4 MG) ON THE TONGUE EVERY 8 HOURS AS NEEDED FOR NAUSEA OR VOMITING (Patient taking differently: Take 4 mg by mouth every 8 (eight) hours as needed for nausea or vomiting (DISSOLVE ON THE TONGUE). ), Disp: 30 tablet, Rfl: 0 .  rizatriptan (MAXALT-MLT) 10 MG disintegrating tablet, Take 10 mg by mouth once as needed for migraine (and may repeat one time in 2 hours, if no relief). , Disp: , Rfl:  .  tiZANidine (ZANAFLEX) 2 MG tablet, TAKE 1 TABLET BY MOUTH TWICE DAILY THEN TAKE 2 TABLETS BY MOUTH EVERY EVENING AS NEEDED (Patient taking differently: Take 2 mg by mouth at bedtime. ), Disp: 360 tablet, Rfl: 1 .  traZODone (DESYREL) 50 MG tablet, Take 50 mg by mouth at bedtime as needed for sleep. , Disp: , Rfl:  .  Vitamin D, Ergocalciferol, (DRISDOL) 50000 units CAPS capsule, Take 50,000 Units by mouth every Sunday. , Disp: , Rfl: 0   Exam: Current vital signs: BP (!) 126/94    Pulse 70   Temp 97.6 F (36.4 C) (Oral)   Resp 19   Ht 5\' 3"  (1.6 m)   Wt 97.1 kg   SpO2 97%   BMI 37.91 kg/m  Vital signs in last 24 hours: Temp:  [97.6 F (36.4 C)] 97.6 F (36.4 C) (07/02 1224) Pulse Rate:  [69-81] 70 (07/02 1400) Resp:  [15-20] 19 (07/02 1400) BP: (115-140)/(79-94) 126/94 (07/02 1400) SpO2:  [96 %-99 %] 97 % (07/02 1457) Weight:  [97.1 kg-97.5 kg] 97.1 kg (07/02 1458)  Physical Exam  Constitutional: Appears well-developed and well-nourished.  Psych: Affect appropriate to situation Eyes:  No scleral injection HENT: No OP obstrucion Head: Normocephalic.  Cardiovascular: Normal rate and regular rhythm.  Respiratory: Effort normal, non-labored breathing GI: Soft.  No distension. There is no tenderness.  Skin: WDI  Neuro: Mental Status: Patient is awake, alert, oriented to person, place, month, year, and situation. Patient is able to give a clear and coherent history. Slight ataxic dysarthria Cranial Nerves: II: Visual Fields are full.  III,IV, VI: EOMI without ptosis or diploplia. Pupils equal, round and reactive to light V: Facial sensation is symmetric to temperature VII: Facial movement is symmetric.  VIII: hearing is intact to voice X: Palat elevates symmetrically XI: Shoulder shrug is symmetric. XII: tongue is midline without atrophy or fasciculations.  Motor: Tone is normal. Bulk is normal. 5/5 strength was present in all four extremities.  Sensory: Sensation is symmetric to light touch and temperature in the arms and legs. Deep Tendon Reflexes: 2+ and symmetric in the biceps and patellae.  Plantars: Toes are downgoing bilaterally.  Cerebellar: FNF and HKS are intact bilaterally  Labs I have reviewed labs in epic and the results pertinent to this consultation are:   CBC    Component Value Date/Time   WBC 5.3 05/03/2019 1227   RBC 3.72 (L) 05/03/2019 1227   HGB 11.4 (L) 05/03/2019 1227   HCT 35.8 (L) 05/03/2019 1227   PLT 115  (L) 05/03/2019 1227   MCV 96.2 05/03/2019 1227   MCH 30.6 05/03/2019 1227   MCHC 31.8 05/03/2019 1227   RDW 13.4 05/03/2019 1227   LYMPHSABS 1.0 05/03/2019 1227   MONOABS 0.3 05/03/2019 1227   EOSABS 0.1 05/03/2019 1227   BASOSABS 0.0 05/03/2019 1227    CMP     Component Value Date/Time   NA 138 05/03/2019 1227   K 3.9 05/03/2019 1227   CL 100 05/03/2019 1227   CO2 24 05/03/2019 1227   GLUCOSE 84 05/03/2019 1227   BUN 9 05/03/2019 1227   CREATININE 0.69 05/03/2019 1227   CALCIUM 9.4 05/03/2019 1227   PROT 7.2 05/03/2019 1227   ALBUMIN 4.0 05/03/2019 1227   AST 19 05/03/2019 1227   ALT 24 05/03/2019 1227   ALKPHOS 66 05/03/2019 1227   BILITOT 0.4 05/03/2019 1227   GFRNONAA >60 05/03/2019 1227   GFRAA >60 05/03/2019 1227    Lipid Panel  No results found for: CHOL, TRIG, HDL, CHOLHDL, VLDL, LDLCALC, LDLDIRECT   Imaging I have reviewed the images obtained:  CT-scan of the brain- negative CT of head  CTA of head and neck- normal CTA of head and neck  MRI examination of the brain-pending  Felicie MornDavid Smith PA-C [facility] [phone] 05/03/2019, 3:32 PM   Assessment: 38 year old female initially presenting to [facility] hospital with right facial droop and numbness which then progressed contiguously in a "marching" fashion inferiorly down her right arm and leg.   1.  Tele-neurology was consulted at the OSH. It was felt that the likelihood of a stroke was high enough that tPA should be considered, which the patient consented to. Patient did receive TPA and then was transferred to [facility] for further evaluation. 2. Overall clinical pattern, based on assessment in [facility] ED, is more consistent with complicated migraine than stroke resolving with tPA.  3. CT head was negative at OSH. CTA of head and neck also negative.    Recommendations: Acuity: Acute Current Suspected Etiology: Complicated migraine Continued Evaluation and post-tPA management: ICU -Admit  to: ICU -Blood pressure control, goal of SYS <180 -MRI/ECHO/A1C/Lipid panel. -  Hyperglycemia management per SSI to maintain glucose 140-180mg /dL. -PT/OT/ST therapies and recommendations when able -NPO until cleared by speech -ST -Advance diet as tolerated -Normal saline at 70 cc/h  Remote history of seizures, possibly related to Chantix -Monitor  Nutrition As tolerated Prophylaxis DVT: SCD GI: Protonix Bowel: Senokot  Diet: NPO until cleared by speech  I have interviewed and examined the patient. My interview of the patient and examination findings were observed and documented by Etta Quill, PA. I have formulated the assessment and plan.  Electronically signed: Dr. Kerney Elbe

## 2019-05-04 ENCOUNTER — Inpatient Hospital Stay (HOSPITAL_COMMUNITY): Payer: Medicaid Other

## 2019-05-04 ENCOUNTER — Other Ambulatory Visit: Payer: Self-pay | Admitting: Neurology

## 2019-05-04 DIAGNOSIS — G43109 Migraine with aura, not intractable, without status migrainosus: Secondary | ICD-10-CM

## 2019-05-04 DIAGNOSIS — I639 Cerebral infarction, unspecified: Secondary | ICD-10-CM

## 2019-05-04 DIAGNOSIS — E669 Obesity, unspecified: Secondary | ICD-10-CM | POA: Diagnosis present

## 2019-05-04 DIAGNOSIS — M797 Fibromyalgia: Secondary | ICD-10-CM

## 2019-05-04 DIAGNOSIS — F17210 Nicotine dependence, cigarettes, uncomplicated: Secondary | ICD-10-CM

## 2019-05-04 LAB — LIPID PANEL
Cholesterol: 177 mg/dL (ref 0–200)
HDL: 28 mg/dL — ABNORMAL LOW (ref >40)
LDL Cholesterol: 91 mg/dL (ref 0–99)
Total CHOL/HDL Ratio: 6.3 RATIO
Triglycerides: 288 mg/dL — ABNORMAL HIGH (ref <150)
VLDL: 58 mg/dL — ABNORMAL HIGH (ref 0–40)

## 2019-05-04 LAB — HIV ANTIBODY (ROUTINE TESTING W REFLEX): HIV Screen 4th Generation wRfx: NONREACTIVE

## 2019-05-04 LAB — ECHOCARDIOGRAM COMPLETE
Height: 63 in
Weight: 3523.83 oz

## 2019-05-04 LAB — HEMOGLOBIN A1C
Hgb A1c MFr Bld: 4.5 % — ABNORMAL LOW (ref 4.8–5.6)
Mean Plasma Glucose: 82.45 mg/dL

## 2019-05-04 MED ORDER — TIZANIDINE HCL 2 MG PO TABS: 2.0000 mg | ORAL_TABLET | Freq: Every day | ORAL | Status: DC

## 2019-05-04 MED ORDER — NAPROXEN 500 MG PO TABS: 500.0000 mg | ORAL_TABLET | Freq: Two times a day (BID) | ORAL | Status: DC | PRN
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
## 2019-05-04 NOTE — Discharge Instructions (Signed)
Alteplase Treatment for Ischemic Stroke  Alteplase is a medicine that can dissolve blood clots. An ischemic stroke is caused by blood clots that block blood flow to the brain. Alteplase can help treat a stroke if it is given very shortly after stroke symptoms begin. It is most effective when it is used within 0-4 hours after the start of symptoms. Before giving this treatment, the health care provider will carefully consider whether the treatment is appropriate based on your age, condition, and other factors. Tell a health care provider about:  Any allergies you have.  All medicines you are taking, including blood thinners, vitamins, herbs, and over-the-counter medicines.  Any medical conditions you have.  Any blood disorders you have.  Any active bleeding in the last 21 days, including gastrointestinal (GI) or vaginal bleeding.  Any surgeries or procedures you have had.  Whether you are pregnant or may be pregnant.  When your stroke symptoms started. What are the risks? Generally, this is a safe treatment. However, problems may occur, including:  Bleeding into the brain.  Bleeding in other parts of the body.  Allergic reaction. What happens before the treatment?  Your health care provider will perform a physical exam and take a detailed medical history.  Your blood pressure, heart rate, and breathing rate (vital signs) will be monitored closely. You may be given medicines to adjust blood pressure, if needed.  You may have tests, including: ? Blood tests. ? A CT scan. What happens during the treatment?  An IV will be inserted into one of your veins.  Alteplase will be given to you through this IV, usually over the course of 1 hour.  In some cases, this medicine may be given directly into the affected artery through a thin, flexible tube (catheter). This is usually inserted in the artery at the top of your leg.  Your vital signs and brain function will be monitored  closely as you receive this medicine.  If bleeding occurs, the medicine will be stopped and appropriate therapy will be started. What can I expect after treatment?  You will be monitored frequently by your health care team in an intensive care unit or a stroke unit.  You will be evaluated by a speech therapist, physical therapist, or an occupational therapist.  If you had a catheter, you may have bruising, soreness, and swelling at the catheter insertion site.  It may take several days, weeks, or even months to fully determine how you responded to the alteplase treatment. Follow these instructions in the hospital: Medicines  Take over-the-counter and prescription medicines only as told by your health care provider. Activity  Do not get out of bed without help. This is for your safety.  Limit activity after your treatment.  Participate in stroke rehabilitation programs as told by your health care provider. General instructions  Tell a nurse or health care provider right away if you have any bleeding, bruising or injuries.  Use a soft-bristled toothbrush. Brush your teeth gently. Get help right away if you have:  Blood in your vomit, stool, or urine.  A serious fall or accident, or you hit your head.  Symptoms of an allergic reaction such as rash or difficulty breathing.  Any symptoms of a stroke. "BE FAST" is an easy way to remember the main warning signs: ? B - Balance. Signs are dizziness, sudden trouble walking, or loss of balance. ? E - Eyes. Signs are trouble seeing or a sudden change in how you see. ?  F - Face. Signs are sudden weakness or loss of feeling in the face, or the face or eyelid drooping on one side. ? A - Arms. Signs are weakness or loss of feeling in an arm. This happens suddenly and usually on one side of the body. ? S - Speech. Signs are sudden trouble speaking, slurred speech, or trouble understanding what people say. ? T - Time. Time to call emergency  services. Write down what time symptoms started.  Other signs of stroke, such as: ? A sudden, very bad headache with no known cause. ? Nausea or vomiting. ? Seizure. These symptoms may represent a serious problem that is an emergency. Do not wait to see if the symptoms will go away. Get medical help right away.  Summary  Alteplase is a medicine that can dissolve blood clots. It can be used to open blocked arteries during an ischemic stroke.  To be effective, this medicine must be given very shortly after stroke symptoms begin, within 4 hours after the start of symptoms.  You will be monitored closely by your health care team during and after receiving alteplase.  Get help right away if you are showing signs of bleeding or are having symptoms of stroke. This information is not intended to replace advice given to you by your health care provider. Make sure you discuss any questions you have with your health care provider. Document Released: 04/05/2008 Document Revised: 04/24/2018 Document Reviewed: 11/15/2017 Elsevier Patient Education  2020 [employer].
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
## 2019-05-04 NOTE — Progress Notes (Signed)
Echocardiogram 2D Echocardiogram has been performed.  Mary Parker 05/04/2019, 11:03 AM

## 2019-05-04 NOTE — Evaluation (Signed)
Speech Language Pathology Evaluation Patient Details Name: Oneita HurtCasey J Francesconi MRN: [id number] DOB: 01-Oct-1981 Today's Date: 05/04/2019 Time: [id number] SLP Time Calculation (min) (ACUTE ONLY): 20 min  Problem List:  Patient Active Problem List   Diagnosis Date Noted  . 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 Acute gastroenteritis 03/29/2019  . 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 S/P abdominal supracervical subtotal hysterectomy 06/21/2017  . Pelvic adhesions 06/09/2017  . History of ovarian cyst 06/09/2017  . Right ovarian cyst 04/25/2017  . Encounter for gynecological examination with Papanicolaou smear of cervix 04/19/2017  . Rectal bleeding 04/02/2017  . LLQ pain 04/01/2017  . Constipation 04/01/2017  . Concussion with loss of consciousness 11/16/2016  . Anxiety 10/14/2016  . Chronic low back pain 05/29/2015  . Common migraine 12/19/2014  . Fibromyalgia 12/19/2014  . Rebound headache 12/19/2014  . Depression 12/11/2014  . Body aches 12/11/2014   Past Medical History:  Past Medical History:  Diagnosis Date  . Anxiety   . Arthritis    both knees  . Body aches 12/11/2014  . Chronic insomnia   . Chronic low back pain 05/29/2015  . Common migraine 12/19/2014  . Depression 12/11/2014  . Fibrocystic disease of both breasts   . Fibromyalgia 12/19/2014  . GERD (gastroesophageal reflux disease)   . History of hiatal hernia   . IBS (irritable bowel syndrome)   . Knee pain, right   . Migraines   . Rebound headache 12/19/2014  . Right ovarian cyst 04/25/2017   Complex, check CA 125 and repeat US in 6 weeks   . Sciatica of left side   . Seizures (HCC)    remote past, felt it was related to Chantix   . Vitamin D deficiency   . Wears glasses    Past Surgical History:  Past Surgical History:  Procedure Laterality Date  . BILATERAL SALPINGECTOMY Bilateral 06/21/2017   Procedure: BILATERAL SALPINGECTOMY;  Surgeon: Tilda BurrowFerguson, John V, MD;  Location: [facility];  Service: Gynecology;   Laterality: Bilateral;  . CESAREAN SECTION  [id number],2000,2004  . CHOLECYSTECTOMY  2001  . COLONOSCOPY WITH PROPOFOL N/A 04/07/2017   Dr. Lovena Neighboursoarke: Normal, abnormal CT likely artifactual  . ENDOMETRIAL ABLATION    . ESOPHAGOGASTRODUODENOSCOPY  2002   [facility]: normal  . KNEE ARTHROSCOPY WITH LATERAL RELEASE Right 04/18/2018   Procedure: Right knee arthroscopy with chondroplasty and lateral release;  Surgeon: Yolonda Kidaogers, Jason Patrick, MD;  [facility];  Service: Orthopedics;  Laterality: Right;  60 mins  . SUPRACERVICAL ABDOMINAL HYSTERECTOMY N/A 06/21/2017   Procedure: HYSTERECTOMY SUPRACERVICAL ABDOMINAL;  Surgeon: Tilda BurrowFerguson, John V, MD;  Location: [facility];  Service: Gynecology;  Laterality: N/A;  . TUBAL LIGATION     HPI:  Pt is a 38 y.o. female with PMH significant for vitamin D deficiency, seizures, rebound headache, migraines, IBS, fibromyalgia, low back pain, insomnia. [facility] department due to sudden onset of right facial droop followed by paresthesias and weakness of the right arm and leg. TPA was given and she was transferred [facility]. MRI of the brain was negative for acute changes.    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 She denied any baseline or current deficits in speech, language or cognition but stated that she still does not recall all of yesterday's events but she remembers one of the doctors commenting on her "not saying something  right" and her speech possibly being "slurred". The [facility] Cognitive Assessment 8.1 was completed to evaluate the pt's cognitive-linguistic skills. She achieved a score of 30/30 which is within the normal limits of 26 or more out of 30 and no speech/language deficits were demonstrated. Further skilled SLP services are not clinically indicated at this time. Pt and nursing were educated  regarding this and both  parties verbalized understanding as well as agreement with plan of care.    SLP Assessment  SLP Recommendation/Assessment: Patient does not need any further Speech Lanaguage Pathology Services SLP Visit Diagnosis: Cognitive communication deficit (R41.841)    Follow Up Recommendations  None    Frequency and Duration           SLP Evaluation Cognition  Overall Cognitive Status: Within Functional Limits for tasks assessed Arousal/Alertness: Awake/alert Orientation Level: Oriented X4 Attention: Focused;Sustained Focused Attention: Appears intact(Vigilance WNL: 1/1) Sustained Attention: Appears intact(Serial 7s: 3/3) Memory: Appears intact(Immediate: 5/5; delayed: 5/5) Awareness: Appears intact Problem Solving: Appears intact(5/5) Executive Function: Reasoning;Sequencing;Organizing Reasoning: Appears intact(Abstraction: 2/2) Sequencing: Appears intact(Clock drawing: 3/3) Organizing: Appears intact(Backward digit span: 1/1)       Comprehension  Auditory Comprehension Overall Auditory Comprehension: Appears within functional limits for tasks assessed Yes/No Questions: Within Functional Limits Commands: Within Functional Limits Complex Commands: (Trail completion: 1/1) Conversation: Diplomatic Services operational officer Discrimination: Within Function Limits Reading Comprehension Reading Status: Within funtional limits    Expression Expression Primary Mode of Expression: Verbal Verbal Expression Overall Verbal Expression: Appears within functional limits for tasks assessed Initiation: No impairment Level of Generative/Spontaneous Verbalization: Conversation Repetition: No impairment(Sentence: 2/2) Naming: No impairment Confrontation: Within functional limits(3/3) Divergent: (1/1) Pragmatics: No impairment Written Expression Dominant Hand: Right Written Expression: (Copying cube: 1/1)   Oral / Motor  Oral Motor/Sensory Function Overall  Oral Motor/Sensory Function: Within functional limits Motor Speech Overall Motor Speech: Appears within functional limits for tasks assessed Respiration: Within functional limits Phonation: Normal Resonance: Within functional limits Articulation: Within functional limitis Intelligibility: Intelligible Motor Planning: Witnin functional limits Motor Speech Errors: Not applicable   Charman Blasco I. Hardin Negus, [place], [place] Office number [phone] Pager [place] 05/04/2019, 9:16 AM

## 2019-05-04 NOTE — Discharge Summary (Signed)
Stroke Discharge Summary  Patient ID: Mary Parker    l   MRN: [id number]      DOB: Jul 11, 1981  Date of Admission: 05/03/2019 Date of Discharge: 05/04/2019  Attending Physician:  Marvel PlanXu, Nasire Reali, MD, Stroke MD Consultant(s):    None  Patient's PCP:  Benita StabileHall, John Z, MD  DISCHARGE DIAGNOSIS:  Principal Problem:   Stroke-like episode [facility](HCC) s/p tPA due to complicated migraine  Active Problems:   Migraine   Fibromyalgia   Cigarette smoker   Obesity, Class II, BMI 35-39.9   Past Medical History:  Diagnosis Date  . Anxiety   . Arthritis    both knees  . Body aches 12/11/2014  . Chronic insomnia   . Chronic low back pain 05/29/2015  . Common migraine 12/19/2014  . Depression 12/11/2014  . Fibrocystic disease of both breasts   . Fibromyalgia 12/19/2014  . GERD (gastroesophageal reflux disease)   . History of hiatal hernia   . IBS (irritable bowel syndrome)   . Knee pain, right   . Migraines   . Rebound headache 12/19/2014  . Right ovarian cyst 04/25/2017   Complex, check CA 125 and repeat US in 6 weeks   . Sciatica of left side   . Seizures (HCC)    remote past, felt it was related to Chantix   . Vitamin D deficiency   . Wears glasses    Past Surgical History:  Procedure Laterality Date  . BILATERAL SALPINGECTOMY Bilateral 06/21/2017   Procedure: BILATERAL SALPINGECTOMY;  Surgeon: Tilda BurrowFerguson, John V, MD;  Location: [facility];  Service: Gynecology;  Laterality: Bilateral;  . CESAREAN SECTION  [phone],2004  . CHOLECYSTECTOMY  2001  . COLONOSCOPY WITH PROPOFOL N/A 04/07/2017   Dr. Lovena Neighboursoarke: Normal, abnormal CT likely artifactual  . ENDOMETRIAL ABLATION    . ESOPHAGOGASTRODUODENOSCOPY  2002   [facility]: normal  . KNEE ARTHROSCOPY WITH LATERAL RELEASE Right 04/18/2018   Procedure: Right knee arthroscopy with chondroplasty and lateral release;  Surgeon: Yolonda Kidaogers, Jason Patrick, MD;  [facility];  Service: Orthopedics;  Laterality: Right;  60 mins  . SUPRACERVICAL ABDOMINAL  HYSTERECTOMY N/A 06/21/2017   Procedure: HYSTERECTOMY SUPRACERVICAL ABDOMINAL;  Surgeon: Tilda BurrowFerguson, John V, MD;  Location: [facility];  Service: Gynecology;  Laterality: N/A;  . TUBAL LIGATION      Allergies as of 05/04/2019      Reactions   Bupropion Other (See Comments)   Suicidal ideation   Duloxetine Hcl Hives, Itching, Rash   Gabapentin Itching   Meloxicam Other (See Comments)   Abdominal cramping and mood changes   Pregabalin Other (See Comments)   Caused TREMORS   Amitriptyline Other (See Comments)   Allergy occurred awhile back- reaction not recalled   Ciprofloxacin Hives, Swelling, Other (See Comments)   Sweating and  Dizziness, also   Desvenlafaxine Succinate Er Other (See Comments)   "Made me feel like I couldn't urinate"   Hydrocodone-acetaminophen Swelling   Milnacipran Nausea Only, Other (See Comments)   Dizziness and leg cramps, also   Nortriptyline Nausea And Vomiting, Other (See Comments)   GERD, also   Topiramate Swelling, Other (See Comments)   Dizziness and slurred speech, also   Decadron [dexamethasone] Other (See Comments)   Flushing, dizziness, and the patient PASSED OUT   Doxycycline Nausea Only   Percocet [oxycodone-acetaminophen] Nausea Only      Medication List    TAKE these medications   acetaminophen 500 MG tablet Commonly known as: TYLENOL Take 500-1,000 mg by mouth  every 6 (six) hours as needed for mild pain or headache.   albuterol (2.5 MG/3ML) 0.083% nebulizer solution Commonly known as: PROVENTIL Take 2.5 mg by nebulization every 6 (six) hours as needed for wheezing or shortness of breath.   ALPRAZolam 0.5 MG tablet Commonly known as: XANAX Take 0.5 mg by mouth at bedtime as needed for sleep.   cyanocobalamin 1000 MCG/ML injection Commonly known as: (VITAMIN B-12) Inject 1,000 mcg into the skin every 30 (thirty) days.   dicyclomine 10 MG capsule Commonly known as: BENTYL Take one up to three times a day for abdominal pain. Hold for  constipation. What changed:   how much to take  how to take this  when to take this  reasons to take this  additional instructions   diphenhydrAMINE 25 mg capsule Commonly known as: BENADRYL Take 25 mg by mouth every 6 (six) hours as needed for itching, allergies or sleep.   escitalopram 20 MG tablet Commonly known as: LEXAPRO Take 20 mg by mouth at bedtime.   Fremanezumab-vfrm 225 MG/1.5ML Soaj Commonly known as: Ajovy Inject 225 mg into the skin every 30 (thirty) days.   naproxen 500 MG tablet Commonly known as: NAPROSYN Take 1 tablet (500 mg total) by mouth 2 (two) times daily as needed for mild pain or moderate pain.   omeprazole 20 MG capsule Commonly known as: PRILOSEC Take 20 mg by mouth at bedtime.   ondansetron 4 MG disintegrating tablet Commonly known as: ZOFRAN-ODT DISSOLVE 1 TABLET(4 MG) ON THE TONGUE EVERY 8 HOURS AS NEEDED FOR NAUSEA OR VOMITING What changed: See the new instructions.   rizatriptan 10 MG disintegrating tablet Commonly known as: MAXALT-MLT Take 10 mg by mouth once as needed for migraine (and may repeat one time in 2 hours, if no relief).   Symbicort 160-4.5 MCG/ACT inhaler Generic drug: budesonide-formoterol Inhale 2 puffs into the lungs 2 (two) times daily as needed (for flares).   tiZANidine 2 MG tablet Commonly known as: ZANAFLEX Take 1 tablet (2 mg total) by mouth at bedtime.   traZODone 50 MG tablet Commonly known as: DESYREL Take 50 mg by mouth at bedtime as needed for sleep.   Vitamin D (Ergocalciferol) 1.25 MG (50000 UT) Caps capsule Commonly known as: DRISDOL Take 50,000 Units by mouth every Sunday.       LABORATORY STUDIES CBC    Component Value Date/Time   WBC 5.3 05/03/2019 1227   RBC 3.72 (L) 05/03/2019 1227   HGB 14.3 05/03/2019 1243   HCT 42.0 05/03/2019 1243   PLT 115 (L) 05/03/2019 1227   MCV 96.2 05/03/2019 1227   MCH 30.6 05/03/2019 1227   MCHC 31.8 05/03/2019 1227   RDW 13.4 05/03/2019 1227    LYMPHSABS 1.0 05/03/2019 1227   MONOABS 0.3 05/03/2019 1227   EOSABS 0.1 05/03/2019 1227   BASOSABS 0.0 05/03/2019 1227   CMP    Component Value Date/Time   NA 136 05/03/2019 1243   K 4.7 05/03/2019 1243   CL 102 05/03/2019 1243   CO2 24 05/03/2019 1227   GLUCOSE 83 05/03/2019 1243   BUN 10 05/03/2019 1243   CREATININE 0.70 05/03/2019 1243   CALCIUM 9.4 05/03/2019 1227   PROT 7.2 05/03/2019 1227   ALBUMIN 4.0 05/03/2019 1227   AST 19 05/03/2019 1227   ALT 24 05/03/2019 1227   ALKPHOS 66 05/03/2019 1227   BILITOT 0.4 05/03/2019 1227   GFRNONAA >60 05/03/2019 1227   GFRAA >60 05/03/2019 1227   COAGS Lab Results  Component Value Date   INR 1.0 05/03/2019   Lipid Panel    Component Value Date/Time   CHOL 177 05/04/2019 0527   TRIG 288 (H) 05/04/2019 0527   HDL 28 (L) 05/04/2019 0527   CHOLHDL 6.3 05/04/2019 0527   VLDL 58 (H) 05/04/2019 0527   LDLCALC 91 05/04/2019 0527   HgbA1C  Lab Results  Component Value Date   HGBA1C 4.5 (L) 05/04/2019   Urinalysis    Component Value Date/Time   COLORURINE STRAW (A) 05/03/2019 1558   APPEARANCEUR CLEAR 05/03/2019 1558   LABSPEC 1.036 (H) 05/03/2019 1558   PHURINE 6.0 05/03/2019 1558   GLUCOSEU NEGATIVE 05/03/2019 1558   HGBUR NEGATIVE 05/03/2019 1558   BILIRUBINUR NEGATIVE 05/03/2019 1558   KETONESUR NEGATIVE 05/03/2019 1558   PROTEINUR NEGATIVE 05/03/2019 1558   UROBILINOGEN 0.2 05/30/2015 1240   NITRITE NEGATIVE 05/03/2019 1558   LEUKOCYTESUR TRACE (A) 05/03/2019 1558   Urine Drug Screen     Component Value Date/Time   LABOPIA NONE DETECTED 05/03/2019 1558   COCAINSCRNUR NONE DETECTED 05/03/2019 1558   LABBENZ POSITIVE (A) 05/03/2019 1558   AMPHETMU NONE DETECTED 05/03/2019 1558   THCU NONE DETECTED 05/03/2019 1558   LABBARB NONE DETECTED 05/03/2019 1558    Alcohol Level    Component Value Date/Time   ETH <10 05/03/2019 1227     SIGNIFICANT DIAGNOSTIC STUDIES Ct Code Stroke Cta Head W/wo  Contrast  Result Date: 05/03/2019 CLINICAL DATA:  Right-sided numbness.  Recent COVID-19 exposure. EXAM: CT ANGIOGRAPHY HEAD AND NECK TECHNIQUE: Multidetector CT imaging of the head and neck was performed using the standard protocol during bolus administration of intravenous contrast. Multiplanar CT image reconstructions and MIPs were obtained to evaluate the vascular anatomy. Carotid stenosis measurements (when applicable) are obtained utilizing NASCET criteria, using the distal internal carotid diameter as the denominator. CONTRAST:  75mL OMNIPAQUE IOHEXOL 350 MG/ML SOLN COMPARISON:  05/03/2019 FINDINGS: CTA NECK FINDINGS SKELETON: There is no bony spinal canal stenosis. No lytic or blastic lesion. OTHER NECK: Normal pharynx, larynx and major salivary glands. No cervical lymphadenopathy. Unremarkable thyroid gland. UPPER CHEST: No pneumothorax or pleural effusion. No nodules or masses. AORTIC ARCH: There is no calcific atherosclerosis of the aortic arch. There is no aneurysm, dissection or hemodynamically significant stenosis of the visualized ascending aorta and aortic arch. Conventional 3 vessel aortic branching pattern. The visualized proximal subclavian arteries are widely patent. RIGHT CAROTID SYSTEM: --Common carotid artery: Widely patent origin without common carotid artery dissection or aneurysm. --Internal carotid artery: Normal without aneurysm, dissection or stenosis. --External carotid artery: No acute abnormality. LEFT CAROTID SYSTEM: --Common carotid artery: Widely patent origin without common carotid artery dissection or aneurysm. --Internal carotid artery: Normal without aneurysm, dissection or stenosis. --External carotid artery: No acute abnormality. VERTEBRAL ARTERIES: Left dominant configuration. Both origins are clearly patent. No dissection, occlusion or flow-limiting stenosis to the skull base (V1-V3 segments). CTA HEAD FINDINGS POSTERIOR CIRCULATION: --Vertebral arteries: Normal V4  segments. --Posterior inferior cerebellar arteries (PICA): Patent origins from the vertebral arteries. --Anterior inferior cerebellar arteries (AICA): Patent origins from the basilar artery. --Basilar artery: Normal. --Superior cerebellar arteries: Normal. --Posterior cerebral arteries (PCA): Normal. The right PCA is partially supplied by a posterior communicating artery (p-comm). ANTERIOR CIRCULATION: --Intracranial internal carotid arteries: Normal. --Anterior cerebral arteries (ACA): Normal. Both A1 segments are present. Patent anterior communicating artery (a-comm). --Middle cerebral arteries (MCA): Normal. VENOUS SINUSES: As permitted by contrast timing, patent. ANATOMIC VARIANTS: None Review of the MIP images confirms the above findings. IMPRESSION:  Normal CTA of the head and neck. Electronically Signed   By: Deatra RobinsonKevin  Herman M.D.   On: 05/03/2019 14:15   Dg Shoulder Right  Result Date: 04/24/2019 CLINICAL DATA:  Fall this morning with right shoulder pain EXAM: RIGHT SHOULDER - 2+ VIEW COMPARISON:  None. FINDINGS: There is no evidence of fracture or dislocation. There is no evidence of arthropathy or other focal bone abnormality. Soft tissues are unremarkable. IMPRESSION: Negative. Electronically Signed   By: Marnee SpringJonathon  Watts M.D.   On: 04/24/2019 10:05   Dg Forearm Right  Result Date: 04/24/2019 CLINICAL DATA:  Right wrist and forearm pain status post fall EXAM: RIGHT FOREARM - 2 VIEW COMPARISON:  None. FINDINGS: There is no evidence of fracture or other focal bone lesions. Soft tissues are unremarkable. IMPRESSION: Negative. Electronically Signed   By: Elige KoHetal  Patel   On: 04/24/2019 09:41   Ct Head Wo Contrast  Result Date: 05/04/2019 CLINICAL DATA:  Follow-up stroke. Acute presentation with right-sided numbness in weakness yesterday. EXAM: CT HEAD WITHOUT CONTRAST TECHNIQUE: Contiguous axial images were obtained from the base of the skull through the vertex without intravenous contrast. COMPARISON:   CT and MR studies done yesterday. FINDINGS: Brain: The brain shows a normal appearance without evidence of malformation, atrophy, old or acute small or large vessel infarction, mass lesion, hemorrhage, hydrocephalus or extra-axial collection. Vascular: No hyperdense vessel. No evidence of atherosclerotic calcification. Skull: Normal.  No traumatic finding.  No focal bone lesion. Sinuses/Orbits: Sinuses are clear. Orbits appear normal. Mastoids are clear. Other: None significant IMPRESSION: Normal head CT. Electronically Signed   By: Paulina FusiMark  Shogry M.D.   On: 05/04/2019 13:10   Ct Code Stroke Cta Neck W/wo Contrast  Result Date: 05/03/2019 CLINICAL DATA:  Right-sided numbness.  Recent COVID-19 exposure. EXAM: CT ANGIOGRAPHY HEAD AND NECK TECHNIQUE: Multidetector CT imaging of the head and neck was performed using the standard protocol during bolus administration of intravenous contrast. Multiplanar CT image reconstructions and MIPs were obtained to evaluate the vascular anatomy. Carotid stenosis measurements (when applicable) are obtained utilizing NASCET criteria, using the distal internal carotid diameter as the denominator. CONTRAST:  75mL OMNIPAQUE IOHEXOL 350 MG/ML SOLN COMPARISON:  05/03/2019 FINDINGS: CTA NECK FINDINGS SKELETON: There is no bony spinal canal stenosis. No lytic or blastic lesion. OTHER NECK: Normal pharynx, larynx and major salivary glands. No cervical lymphadenopathy. Unremarkable thyroid gland. UPPER CHEST: No pneumothorax or pleural effusion. No nodules or masses. AORTIC ARCH: There is no calcific atherosclerosis of the aortic arch. There is no aneurysm, dissection or hemodynamically significant stenosis of the visualized ascending aorta and aortic arch. Conventional 3 vessel aortic branching pattern. The visualized proximal subclavian arteries are widely patent. RIGHT CAROTID SYSTEM: --Common carotid artery: Widely patent origin without common carotid artery dissection or aneurysm.  --Internal carotid artery: Normal without aneurysm, dissection or stenosis. --External carotid artery: No acute abnormality. LEFT CAROTID SYSTEM: --Common carotid artery: Widely patent origin without common carotid artery dissection or aneurysm. --Internal carotid artery: Normal without aneurysm, dissection or stenosis. --External carotid artery: No acute abnormality. VERTEBRAL ARTERIES: Left dominant configuration. Both origins are clearly patent. No dissection, occlusion or flow-limiting stenosis to the skull base (V1-V3 segments). CTA HEAD FINDINGS POSTERIOR CIRCULATION: --Vertebral arteries: Normal V4 segments. --Posterior inferior cerebellar arteries (PICA): Patent origins from the vertebral arteries. --Anterior inferior cerebellar arteries (AICA): Patent origins from the basilar artery. --Basilar artery: Normal. --Superior cerebellar arteries: Normal. --Posterior cerebral arteries (PCA): Normal. The right PCA is partially supplied by a posterior communicating artery (  p-comm). ANTERIOR CIRCULATION: --Intracranial internal carotid arteries: Normal. --Anterior cerebral arteries (ACA): Normal. Both A1 segments are present. Patent anterior communicating artery (a-comm). --Middle cerebral arteries (MCA): Normal. VENOUS SINUSES: As permitted by contrast timing, patent. ANATOMIC VARIANTS: None Review of the MIP images confirms the above findings. IMPRESSION: Normal CTA of the head and neck. Electronically Signed   By: Ulyses Jarred M.D.   On: 05/03/2019 14:15   Mr Brain Wo Contrast  Result Date: 05/03/2019 CLINICAL DATA:  Stroke follow-up EXAM: MRI HEAD WITHOUT CONTRAST TECHNIQUE: Multiplanar, multiecho pulse sequences of the brain and surrounding structures were obtained without intravenous contrast. COMPARISON:  Head CT 05/03/2019 FINDINGS: BRAIN: There is no acute infarct, acute hemorrhage or extra-axial collection. The midline structures are normal. There is no midline shift or mass effect. The white matter  signal is normal for the patient's age. The cerebral and cerebellar volume are age-appropriate. There is no hydrocephalus. Susceptibility-sensitive sequences show no chronic microhemorrhage or superficial siderosis. VASCULAR: The major intracranial arterial and venous sinus flow voids are normal. SKULL AND UPPER CERVICAL SPINE: Calvarial bone marrow signal is normal. There is no skull base mass. The visualized upper cervical spine and soft tissues are normal. SINUSES/ORBITS: There are no fluid levels or advanced mucosal thickening. The mastoid air cells and middle ear cavities are free of fluid. The orbits are normal. IMPRESSION: Normal brain. Electronically Signed   By: Ulyses Jarred M.D.   On: 05/03/2019 19:11   Dg Hand Complete Right  Result Date: 04/24/2019 CLINICAL DATA:  Right wrist and forearm pain after a fall in the shower today. EXAM: RIGHT HAND - COMPLETE 3+ VIEW COMPARISON:  Wrist radiographs dated 06/29/2015 FINDINGS: There is no evidence of fracture or dislocation. There is no evidence of arthropathy or other focal bone abnormality. Soft tissues are unremarkable. IMPRESSION: Normal exam. Electronically Signed   By: Lorriane Shire M.D.   On: 04/24/2019 09:41   Ct Head Code Stroke Wo Contrast  Result Date: 05/03/2019 CLINICAL DATA:  Code stroke.  Numbness and weakness right side EXAM: CT HEAD WITHOUT CONTRAST TECHNIQUE: Contiguous axial images were obtained from the base of the skull through the vertex without intravenous contrast. COMPARISON:  CT head 11/25/2016 FINDINGS: Brain: No evidence of acute infarction, hemorrhage, hydrocephalus, extra-axial collection or mass lesion/mass effect. Vascular: Negative for hyperdense vessel Skull: Negative Sinuses/Orbits: Negative Other: None ASPECTS ([place] Stroke Program Early CT Score) - Ganglionic level infarction (caudate, lentiform nuclei, internal capsule, insula, M1-M3 cortex): 7 - Supraganglionic infarction (M4-M6 cortex): 3 Total score (0-10 with  10 being normal): 10 IMPRESSION: 1. Negative CT head 2. ASPECTS is 10 3. These results were called by telephone at the time of interpretation on 05/03/2019 at 12:54 pm to Dr. Fredia Sorrow , who verbally acknowledged these results. Electronically Signed   By: Franchot Gallo M.D.   On: 05/03/2019 12:55       HISTORY OF PRESENT ILLNESS SOLACE MANWARREN is a 38 y.o. female  with history of vitamin D deficiency, seizures, rebound headache, migraines, IBS, fibromyalgia, low back pain, insomnia.  Patient arrived at [facility] emergency department due to sudden onset of right facial droop followed by paresthesias and weakness of the right arm and leg at approximately 0815 on 05/03/2019 (LKW).  She then states that after the symptom  Onset she did have a throbbing headache that was 8/10.  At present time she states that her headache has significantly improved to 3/10 pain and her sensory symptoms have improved completely.  She states that she has migraine headaches, sometimes with visual aura, but that she has never had a migraine complicated with neurological issues. Of note, she is prone to having multiple migraines.  Tele-neurology was consulted at [facility]; it was felt that the likelihood of a stroke was high enough that tPA should be considered, which the patient consented to. Patient was then transferred to [facility] ED for further evaluation after having a CTA head/neck at [facility] which was negative. The patient currently has a slight headache in her right occipital region; other than that, she feels that all of her symptoms have resolved. Premorbid modified Rankin scale (mRS): 0. NIH stroke scale of 1.  She was admitted to the neuro ICU for further evaluation and treatment  HOSPITAL COURSE Ms. NICEY KRAH is a 38 y.o. female with history of vitamin D deficiency, mild seizures, rebound headache, migraines, IBS, fibromyalgia, low back pain and insomnia presenting to [facility] with right facial droop  followed by paresthesias and weakness of the right arm and leg.  Received IV TPA 05/03/2019 at 1:15 PM.  Stroke-like episode s/p tPA Final dx:  Complicated Migraine  Code Stroke CT head No acute abnormality. ASPECTS 10.     CTA head & neck normal  MRI  normal  Repeat CT head 24 hours after TPA normal   2D Echo EF > 65%   LDL 91  HgbA1c 4.5  SCDs for VTE prophylaxis  No antithrombotic prior to admission, now on No antithrombotic as no stroke dx  Therapy recommendations: No therapy needs  Disposition: Return home  Tobacco abuse  Current smoker  Smoking cessation counseling provided  Pt is willing to quit  Other Stroke Risk Factors  ETOH use, advised to drink no more than 2 drink(s) a day  Obesity, Body mass index is 39.01 kg/m., recommend weight loss, diet and exercise as appropriate   Migraines - following with Dr. Anne Hahn and on Ajovy  Other Active Problems  Fibromyalgia   DISCHARGE EXAM Blood pressure (!) 114/93, pulse 78, temperature 98.9 F (37.2 C), temperature source Oral, resp. rate (!) 33, height  (1.6 m), weight 99.9 kg, SpO2 99 %.  Temp:  [97.8 F (36.6 C)-98.9 F (37.2 C)] 97.9 F (36.6 C) (07/03 1535) Pulse Rate:  [58-78] 78 (07/03 1200) Resp:  [7-33] 33 (07/03 1200) BP: (87-130)/(42-95) 114/93 (07/03 1200) SpO2:  [94 %-99 %] 99 % (07/03 1200) Weight:  [99.9 kg] 99.9 kg (07/02 2000)  General - Well nourished, well developed, in no apparent distress.  Ophthalmologic - fundi not visualized due to noncooperation.  Cardiovascular - Regular rate and rhythm.  Mental Status -  Level of arousal and orientation to time, place, and person were intact. Language including expression, naming, repetition, comprehension was assessed and found intact. Attention span and concentration were normal. Recent and remote memory were intact. Fund of Knowledge was assessed and was intact.  Cranial Nerves II - XII - II - Visual field intact OU III,  IV, VI - Extraocular movements intact. V - Facial sensation intact bilaterally. VII - Facial movement intact bilaterally. VIII - Hearing & vestibular intact bilaterally. X - Palate elevates symmetrically. XI - Chin turning & shoulder shrug intact bilaterally. XII - Tongue protrusion intact.  Motor Strength - The patient's strength was normal in all extremities and pronator drift was absent.  Bulk was normal and fasciculations were absent.   Motor Tone - Muscle tone was assessed at the neck and appendages  and was normal.  Reflexes - The patient's reflexes were symmetrical in all extremities and she had no pathological reflexes.  Sensory - Light touch, temperature/pinprick were assessed and were symmetrical.    Coordination - The patient had normal movements in the hands and feet with no ataxia or dysmetria.  Tremor was absent  Gait and Station - deferred.   Discharge Diet   regular thin liquids  DISCHARGE PLAN  Ongoing stroke risk factor control by Primary Care Physician at time of discharge  Follow-up Benita Stabile, MD in 2 weeks.  Follow up with Dr. Anne Hahn in 4 weeks at [facility]  35 minutes were spent preparing discharge.  Marvel Plan, MD PhD Stroke Neurology 05/04/2019 6:59 PM

## 2019-05-04 NOTE — Plan of Care (Signed)
Pt NIH 0. Stable and adequate for discharge.

## 2019-05-07 ENCOUNTER — Other Ambulatory Visit: Payer: Self-pay | Admitting: Neurology

## 2019-05-07 MED ORDER — VERAPAMIL HCL ER 180 MG PO TBCR: 180.0000 mg | EXTENDED_RELEASE_TABLET | Freq: Every day | ORAL | 3 refills | Status: DC

## 2019-05-07 NOTE — Progress Notes (Signed)
Mary Parker, I sent a message back to her.  She most likely had a complicated migraine not a stroke.  I am going to send in a prescription for verapamil into her drugstore.  Please see if you at the front can get her on Dr. Tobey Grim schedule later this week

## 2019-05-07 NOTE — Progress Notes (Signed)
Sent pt mychart message

## 2019-05-10 ENCOUNTER — Encounter: Payer: Self-pay | Admitting: Neurology

## 2019-05-10 ENCOUNTER — Other Ambulatory Visit: Payer: Self-pay

## 2019-05-10 ENCOUNTER — Ambulatory Visit (INDEPENDENT_AMBULATORY_CARE_PROVIDER_SITE_OTHER): Payer: Medicaid Other | Admitting: Neurology

## 2019-05-10 VITALS — BP 109/79 | HR 67 | Temp 97.1°F | Ht 63.0 in | Wt 214.0 lb

## 2019-05-10 DIAGNOSIS — G43109 Migraine with aura, not intractable, without status migrainosus: Secondary | ICD-10-CM | POA: Diagnosis not present

## 2019-05-10 MED ORDER — PREDNISONE 5 MG PO TABS: ORAL_TABLET | ORAL | 0 refills | Status: DC

## 2019-05-10 MED ORDER — UBRELVY 100 MG PO TABS: 100.0000 mg | ORAL_TABLET | Freq: Two times a day (BID) | ORAL | 2 refills | Status: DC | PRN

## 2019-05-10 MED ORDER — MIRTAZAPINE 15 MG PO TABS: 15.0000 mg | ORAL_TABLET | Freq: Every day | ORAL | 2 refills | Status: DC

## 2019-05-10 NOTE — Progress Notes (Signed)
Reason for visit: Migraine headache  Referring physician: [facility]  Mary Parker is a 38 y.o. female  History of present illness:  Mary Parker is a 38 year old right-handed white female with a history of migraine headaches and left SI joint dysfunction with back pain and leg pain.  The patient has a longstanding history of a psychogenic left hemisensory deficit that has been documented on multiple examinations in the past.  The patient went to the emergency room on 03 May 2019 with onset of right facial weakness and right-sided numbness involving the face, arm, and leg.  The patient had heaviness of the right arm.  She went to [facility] and received TPA and was transferred to [facility].  The patient underwent MRI of the brain that was completely normal and a CT angiogram of the head and neck was unremarkable.  The patient was noted to have significant resolution of her symptoms within 3 to 4 hours but she continues to the present to complain of ongoing right sided numbness.  The patient reported that she did have slurred speech at the time of onset of deficits as well, she denied any visual field changes but she did have a headache.  It was presumed that the patient had migraine headache with neurologic features.  After discharge from the hospital, the patient was placed on verapamil.  The patient has only taken 2 doses so far.  She has had daily headaches following the hospitalization, while on Ajovy prior to the hospitalization, she was doing quite well with her headaches having only about 1 headache a month.  She has been using Maxalt if needed for the headache.  Currently, she denies any balance problems or difficulty controlling the bowels or the bladder.     Past Medical History:  Diagnosis Date  . Anxiety   . Arthritis    both knees  . Body aches 12/11/2014  . Chronic insomnia   . Chronic low back pain 05/29/2015  . Common migraine 12/19/2014  . Depression 12/11/2014  .  Fibrocystic disease of both breasts   . Fibromyalgia 12/19/2014  . GERD (gastroesophageal reflux disease)   . History of hiatal hernia   . IBS (irritable bowel syndrome)   . Knee pain, right   . Migraines   . Rebound headache 12/19/2014  . Right ovarian cyst 04/25/2017   Complex, check CA 125 and repeat US in 6 weeks   . Sciatica of left side   . Seizures ([place])    remote past, felt it was related to Chantix   . Vitamin D deficiency   . Wears glasses     Past Surgical History:  Procedure Laterality Date  . BILATERAL SALPINGECTOMY Bilateral 06/21/2017   Procedure: BILATERAL SALPINGECTOMY;  Surgeon: Jonnie Kind, MD;  Location: [facility];  Service: Gynecology;  Laterality: Bilateral;  . CESAREAN SECTION  [phone]  . CHOLECYSTECTOMY  2001  . COLONOSCOPY WITH PROPOFOL N/A 04/07/2017   Dr. Emerson Monte: Normal, abnormal CT likely artifactual  . ENDOMETRIAL ABLATION    . ESOPHAGOGASTRODUODENOSCOPY  2002   [facility]: normal  . KNEE ARTHROSCOPY WITH LATERAL RELEASE Right 04/18/2018   Procedure: Right knee arthroscopy with chondroplasty and lateral release;  Surgeon: Nicholes Stairs, MD;  Location: [facility];  Service: Orthopedics;  Laterality: Right;  60 mins  . SUPRACERVICAL ABDOMINAL HYSTERECTOMY N/A 06/21/2017   Procedure: HYSTERECTOMY SUPRACERVICAL ABDOMINAL;  Surgeon: Jonnie Kind, MD;  Location: [facility];  Service: Gynecology;  Laterality: N/A;  . TUBAL LIGATION      Family History  Problem Relation Age of Onset  . Hypertension Mother   . Other Mother        abnormal cells; had hyst  . Obesity Daughter   . Diabetes Daughter [id number]       youngest  . Obesity Son   . Cancer Maternal Grandmother   . Osteoporosis Maternal Grandmother   . Cancer Maternal Grandfather   . Obesity Daughter   . Diabetes Father   . Hypertension Father   . Alcohol abuse Father   . Colon cancer Neg Hx     Social history:  reports that she has been smoking cigarettes. She has a  6.25 pack-year smoking history. She has never used smokeless tobacco. She reports that she does not drink alcohol or use drugs.  Medications:  Prior to Admission medications   Medication Sig Start Date End Date Taking? Authorizing Provider  acetaminophen (TYLENOL) 500 MG tablet Take 500-1,000 mg by mouth every 6 (six) hours as needed for mild pain or headache.   Yes [provider]  albuterol (PROVENTIL) (2.5 MG/3ML) 0.083% nebulizer solution Take 2.5 mg by nebulization every 6 (six) hours as needed for wheezing or shortness of breath.  02/21/18  Yes [provider]  ALPRAZolam Prudy Feeler(XANAX) 0.5 MG tablet Take 0.5 mg by mouth at bedtime as needed for sleep.  10/06/18  Yes [provider]  budesonide-formoterol (SYMBICORT) 160-4.5 MCG/ACT inhaler Inhale 2 puffs into the lungs 2 (two) times daily as needed (for flares).    Yes [provider]  cyanocobalamin (,VITAMIN B-12,) 1000 MCG/ML injection Inject 1,000 mcg into the skin every 30 (thirty) days.  02/17/18  Yes [provider]  dicyclomine (BENTYL) 10 MG capsule Take one up to three times a day for abdominal pain. Hold for constipation. Patient taking differently: Take 10 mg by mouth 3 (three) times daily as needed for spasms (or abdominal pain and HOLD FOR CONSTIPATION).  03/29/19  Yes Tiffany KocherLewis, Leslie S, PA-C  diphenhydrAMINE (BENADRYL) 25 mg capsule Take 25 mg by mouth every 6 (six) hours as needed for itching, allergies or sleep.   Yes [provider]  escitalopram (LEXAPRO) 20 MG tablet Take 20 mg by mouth at bedtime.  10/31/17  Yes [provider]  Fremanezumab-vfrm (AJOVY) 225 MG/1.5ML SOAJ Inject 225 mg into the skin every 30 (thirty) days. 03/05/19  Yes York SpanielWillis, Charles K, MD  naproxen (NAPROSYN) 500 MG tablet Take 1 tablet (500 mg total) by mouth 2 (two) times daily as needed for mild pain or moderate pain. 05/04/19  Yes Layne BentonBiby, Sharon L, NP  omeprazole (PRILOSEC) 20 MG capsule Take 20 mg by  mouth at bedtime.  03/17/19  Yes [provider]  ondansetron (ZOFRAN-ODT) 4 MG disintegrating tablet DISSOLVE 1 TABLET(4 MG) ON THE TONGUE EVERY 8 HOURS AS NEEDED FOR NAUSEA OR VOMITING Patient taking differently: Take 4 mg by mouth every 8 (eight) hours as needed for nausea or vomiting (DISSOLVE ON THE TONGUE).  03/29/19  Yes York SpanielWillis, Charles K, MD  rizatriptan (MAXALT-MLT) 10 MG disintegrating tablet Take 10 mg by mouth once as needed for migraine (and may repeat one time in 2 hours, if no relief).  07/24/18  Yes [provider]  tiZANidine (ZANAFLEX) 2 MG tablet Take 1 tablet (2 mg total) by mouth at bedtime. 05/04/19  Yes Layne BentonBiby, Sharon L, NP  traZODone (DESYREL) 50 MG tablet Take 50 mg by mouth at bedtime as needed  for sleep.    Yes [provider]  verapamil (CALAN-SR) 180 MG CR tablet Take 1 tablet (180 mg total) by mouth at bedtime. 05/07/19  Yes Sater, Pearletha Furlichard A, MD  Vitamin D, Ergocalciferol, (DRISDOL) 50000 units CAPS capsule Take 50,000 Units by mouth every Sunday.  02/18/18  Yes [provider]      Allergies  Allergen Reactions  . Bupropion Other (See Comments)    Suicidal ideation   . Duloxetine Hcl Hives, Itching and Rash  . Gabapentin Itching  . Meloxicam Other (See Comments)    Abdominal cramping and mood changes  . Pregabalin Other (See Comments)    Caused TREMORS   . Amitriptyline Other (See Comments)    Allergy occurred awhile back- reaction not recalled  . Ciprofloxacin Hives, Swelling and Other (See Comments)    Sweating and  Dizziness, also  . Desvenlafaxine Succinate Er Other (See Comments)    "Made me feel like I couldn't urinate"  . Hydrocodone-Acetaminophen Swelling  . Milnacipran Nausea Only and Other (See Comments)    Dizziness and leg cramps, also   . Nortriptyline Nausea And Vomiting and Other (See Comments)    GERD, also  . Topiramate Swelling and Other (See Comments)    Dizziness and slurred speech, also  . Decadron  [Dexamethasone] Other (See Comments)    Flushing, dizziness, and the patient PASSED OUT  . Doxycycline Nausea Only  . Percocet [Oxycodone-Acetaminophen] Nausea Only    ROS:  Out of a complete 14 system review of symptoms, the patient complains only of the following symptoms, and all other reviewed systems are negative.  Headache Numbness  Blood pressure 109/79, pulse 67, temperature (!) 97.1 F (36.2 C), temperature source Temporal, height 5\' 3"  (1.6 m), weight 214 lb (97.1 kg).  Physical Exam  General: The patient is alert and cooperative at the time of the examination.  The patient is moderately obese.  Eyes: Pupils are equal, round, and reactive to light. Discs are flat bilaterally.  Neck: The neck is supple, no carotid bruits are noted.  Respiratory: The respiratory examination is clear.  Cardiovascular: The cardiovascular examination reveals a regular rate and rhythm, no obvious murmurs or rubs are noted.  Skin: Extremities are without significant edema.  Neurologic Exam  Mental status: The patient is alert and oriented x 3 at the time of the examination. The patient has apparent normal recent and remote memory, with an apparently normal attention span and concentration ability.  Cranial nerves: Facial symmetry is present. There is good sensation of the face on the left, decreased on the right.  She does not split the midline with vibratory sensation on the forehead. The strength of the facial muscles and the muscles to head turning and shoulder shrug are normal bilaterally. Speech is well enunciated, no aphasia or dysarthria is noted. Extraocular movements are full. Visual fields are full. The tongue is midline, and the patient has symmetric elevation of the soft palate. No obvious hearing deficits are noted.  Motor: The motor testing reveals 5 over 5 strength of all 4 extremities. Good symmetric motor tone is noted throughout.  Sensory: Sensory testing is notable for  decreased pinprick sensation on the right arm and right leg with decreased vibration sensation on the right arm as compared to left, symmetric on the legs.  Coordination: Cerebellar testing reveals good finger-nose-finger and heel-to-shin bilaterally.  Gait and station: Gait is normal. Tandem gait is normal. Romberg is negative. No drift is seen.  Reflexes: Deep tendon  reflexes are symmetric and normal bilaterally. Toes are downgoing bilaterally.   MRI brain 05/03/19:  IMPRESSION: Normal brain.  * MRI scan images were reviewed online. I agree with the written report.   2D echo 05/04/19:  IMPRESSIONS    1. The left ventricle has hyperdynamic systolic function, with an ejection fraction of >65%. The cavity size was normal. There is mildly increased left ventricular wall thickness. Left ventricular diastolic parameters were normal. No evidence of left  ventricular regional wall motion abnormalities.  2. The right ventricle has normal systolic function. The cavity was normal. There is no increase in right ventricular wall thickness.  3. The mitral valve is grossly normal.  4. The tricuspid valve is grossly normal.  5. The aortic valve is grossly normal. No stenosis of the aortic valve.   CTA head and neck 05/03/19:  IMPRESSION: Normal CTA of the head and neck.   Assessment/Plan:  1.  History of psychogenic left hemisensory deficit  2.  New onset right hemisensory deficit  3.  History of migraine headache  4.  History of left SI joint dysfunction, left leg pain  The patient has a normal objective clinical examination today but she still reports some ongoing right-sided sensory changes.  The patient will stop the verapamil, she will go on low-dose aspirin 81 mg daily.  She will stop the Maxalt given the recent migraine with neurologic features.  She will be converted to [place] to take if needed for the headache.  She will be given a 6-day course of prednisone as she is having  ongoing daily headaches at this time.  She will follow-up here in about 4 months.  The patient uses trazodone for sleep at night, she claims that this causes nightmares, she will stop the medication and try mirtazapine at 15 mg at night, she will call for any dose adjustments.   Marlan Palau. Keith Willis MD 05/10/2019 11:11 AM  [facility] [address] [place], [place] [id number]  Phone [phone] Fax [phone]

## 2019-05-10 NOTE — Patient Instructions (Signed)
Stop the Verapamil and the Maxalt.  May use Ubrelvy if needed for the headache.  Start Aspirin 81 mg a day.

## 2019-05-15 ENCOUNTER — Telehealth: Payer: Self-pay

## 2019-05-15 NOTE — Telephone Encounter (Signed)
PA for Alvie Heidelberg has been completed and faxed to [facility]. Fax # [phone], confirmation received.

## 2019-05-19 ENCOUNTER — Other Ambulatory Visit: Payer: Self-pay | Admitting: Gastroenterology

## 2019-05-22 ENCOUNTER — Other Ambulatory Visit: Payer: Self-pay | Admitting: Neurology

## 2019-05-22 MED ORDER — DICLOFENAC POTASSIUM 50 MG PO TABS: 50.0000 mg | ORAL_TABLET | Freq: Three times a day (TID) | ORAL | 1 refills | Status: DC | PRN

## 2019-05-22 NOTE — Telephone Encounter (Signed)
PA for ubrevly was denied through [facility]. MD with rx Diclofenac potassium.

## 2019-06-20 ENCOUNTER — Telehealth: Payer: Self-pay | Admitting: Neurology

## 2019-06-20 ENCOUNTER — Telehealth: Payer: Self-pay | Admitting: *Deleted

## 2019-06-20 MED ORDER — INDOMETHACIN 25 MG PO CAPS: ORAL_CAPSULE | ORAL | 1 refills | Status: DC

## 2019-06-20 NOTE — Telephone Encounter (Signed)
The prescription for Indomethacin has been sent to the pharmacy for her.  I responded back to her through mychart to let her know.

## 2019-06-20 NOTE — Addendum Note (Signed)
Addended by: Noberto Retort C on: 06/20/2019 10:47 AM   Modules accepted: Orders

## 2019-06-20 NOTE — Telephone Encounter (Signed)
Pt has called asking that RN Jinny Blossom be aware of the response she sent directly to Dr Jannifer Franklin on [place].  Pt messaged Dr Willis:Yes please. 06/19/19 2:34 PM Yes please i would like to see if it would help. 06/19/19 4:16 PM Pt said she still has a migraine.

## 2019-06-20 NOTE — Telephone Encounter (Signed)
Email from our office to patient:   06/19/19 1:47 PM  Hi Myriam Jacobson!  I asked my covering MD to review this message for you. Dr. Jannifer Franklin is out of the office this week. He recommended Indomethacin 25-50 mg to take PRN. You should not take more than 6 pills/week. Would you be ok with [place] sending this rx for you?  Jinny Blossom, RN

## 2019-06-20 NOTE — Telephone Encounter (Signed)
Sater, Nanine Means, MD to Anda Latina, RN    06/19/19 12:27 PM We can try indomethacin 25-50 mg prn headache.  Should not take more than 6 pills/week. If stomach upset, should stop   #30 "1-2 po prn headache" 1 refill    The patient would like to try this medication.  New rx sent to pharmacy.

## 2019-06-20 NOTE — Telephone Encounter (Signed)
I emailed the patient back letting her know the prescription has been sent to the pharmacy.  Also, informed her to avoid other OTC NSAIDS when taking this medication.

## 2019-07-12 ENCOUNTER — Other Ambulatory Visit: Payer: Self-pay

## 2019-07-12 ENCOUNTER — Ambulatory Visit (INDEPENDENT_AMBULATORY_CARE_PROVIDER_SITE_OTHER): Payer: Medicaid Other | Admitting: Neurology

## 2019-07-12 ENCOUNTER — Encounter: Payer: Self-pay | Admitting: Neurology

## 2019-07-12 DIAGNOSIS — G43711 Chronic migraine without aura, intractable, with status migrainosus: Secondary | ICD-10-CM

## 2019-07-12 MED ORDER — REYVOW 100 MG PO TABS: 100.0000 mg | ORAL_TABLET | Freq: Every day | ORAL | 1 refills | Status: DC | PRN

## 2019-07-12 NOTE — Progress Notes (Signed)
Reason for visit: Migraine headache  Mary Parker is an 38 y.o. female  History of present illness:  Mary Parker is a 38 year old right-handed white female with a history of intractable migraine headache and a history of a psychogenic right hemisensory deficit.  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  She actually received TPA and underwent a stroke work-up that was unremarkable.  She claims that she feels weak on the right side, and since that event, she has had daily headaches.  She has been doing quite well on Ajovy, she was only having about 1 headache a month, but the Ajovy has become ineffective.  She claims that the headaches are mainly on the left side of the head but occasionally may be on the right associated with some nausea and vomiting, photophobia and phonophobia.  The patient does have some neck discomfort as well.  She has missed about 11 days of work since 03 May 2019.  In the past, she has been on multiple medications for headache including trazodone, nortriptyline, Topamax, gabapentin, Lyrica, propranolol, verapamil, and Cymbalta.  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  She was given indomethacin which seemed to help slightly for the headache.  She was given a prescription for Bernita Raisin, but it was denied to her insurance.  She returns to this office for an evaluation.  She does note some episodes of dizziness, she denies any vision changes or difficulty controlling the bowels or bladder with exception she has some stress incontinence.  Past Medical History:  Diagnosis Date  . Anxiety   . Arthritis    both knees  . Body aches 12/11/2014  . Chronic insomnia   . Chronic low  back pain 05/29/2015  . Common migraine 12/19/2014  . Depression 12/11/2014  . Fibrocystic disease of both breasts   . Fibromyalgia 12/19/2014  . GERD (gastroesophageal reflux disease)   . History of hiatal hernia   . IBS (irritable bowel syndrome)   . Knee pain, right   . Migraines   . Rebound headache 12/19/2014  . Right ovarian cyst 04/25/2017   Complex, check CA 125 and repeat US in 6 weeks   . Sciatica of left side   . Seizures (HCC)    remote past, felt it was related to Chantix   . Vitamin D deficiency   . Wears glasses     Past Surgical History:  Procedure Laterality Date  . BILATERAL SALPINGECTOMY Bilateral 06/21/2017   Procedure: BILATERAL SALPINGECTOMY;  Surgeon: Tilda Burrow, MD;  Location: [facility];  Service: Gynecology;  Laterality: Bilateral;  . CESAREAN SECTION  [phone]  . CHOLECYSTECTOMY  2001  . COLONOSCOPY WITH PROPOFOL N/A 04/07/2017   Dr. Lovena Neighbours: Normal, abnormal CT likely artifactual  . ENDOMETRIAL ABLATION    . ESOPHAGOGASTRODUODENOSCOPY  2002   [facility]: normal  . KNEE ARTHROSCOPY WITH LATERAL RELEASE Right 04/18/2018   Procedure: Right knee arthroscopy with chondroplasty and lateral release;  Surgeon: Yolonda Kida, MD;  Location: [facility];  Service: Orthopedics;  Laterality: Right;  60 mins  . SUPRACERVICAL ABDOMINAL HYSTERECTOMY N/A 06/21/2017   Procedure: HYSTERECTOMY SUPRACERVICAL ABDOMINAL;  Surgeon: Tilda Burrow, MD;  Location: [facility];  Service: Gynecology;  Laterality: N/A;  . TUBAL LIGATION      Family History  Problem Relation Age of Onset  . Hypertension Mother   . Other Mother        abnormal cells; had hyst  . Obesity Daughter   . Diabetes Daughter 61       youngest  . Obesity Son   . Cancer Maternal Grandmother   . Osteoporosis Maternal Grandmother   . Cancer Maternal Grandfather   . Obesity Daughter   . Diabetes Father   . Hypertension Father   . Alcohol abuse Father   . Colon cancer Neg Hx      Social history:  reports that she has been smoking cigarettes. She has a 6.25 pack-year smoking history. She has never used smokeless tobacco. She reports that she does not drink alcohol or use drugs.    Allergies  Allergen Reactions  . Bupropion Other (See Comments)    Suicidal ideation   . Duloxetine Hcl Hives, Itching and Rash  . Gabapentin Itching  . Meloxicam Other (See Comments)    Abdominal cramping and mood changes  . Pregabalin Other (See Comments)    Caused TREMORS   . Amitriptyline Other (See Comments)    Allergy occurred awhile back- reaction not recalled  . Ciprofloxacin Hives, Swelling and Other (See Comments)    Sweating and  Dizziness, also  . Desvenlafaxine Succinate Er Other (See Comments)    "Made me feel like I couldn't urinate"  . Hydrocodone-Acetaminophen Swelling  . Milnacipran Nausea Only and Other (See Comments)    Dizziness and leg cramps, also   . Nortriptyline Nausea And Vomiting and Other (See Comments)    GERD, also  . Topiramate Swelling and Other (See Comments)    Dizziness and slurred speech, also  . Decadron [Dexamethasone] Other (See Comments)    Flushing, dizziness, and the patient PASSED OUT  . Doxycycline Nausea Only  . Percocet [Oxycodone-Acetaminophen] Nausea Only    Medications:  Prior to Admission medications   Medication Sig Start Date End Date Taking? Authorizing Provider  acetaminophen (TYLENOL) 500 MG tablet Take 500-1,000 mg by mouth every 6 (six) hours as needed for mild pain or headache.   Yes [provider]  albuterol (PROVENTIL) (2.5 MG/3ML) 0.083% nebulizer solution Take 2.5 mg by nebulization every 6 (six) hours as needed for wheezing or shortness of breath.  02/21/18  Yes [provider]  ALPRAZolam Duanne Moron) 0.5 MG tablet Take 0.5 mg by mouth at bedtime as needed for sleep.  10/06/18  Yes [provider]  aspirin 81 MG chewable tablet Chew 81 mg by mouth daily.   Yes [provider]   budesonide-formoterol (SYMBICORT) 160-4.5 MCG/ACT inhaler Inhale 2 puffs into the lungs 2 (two) times daily as needed (for flares).    Yes [provider]  cyanocobalamin (,VITAMIN B-12,) 1000 MCG/ML injection Inject 1,000 mcg into the skin every 30 (thirty) days.  02/17/18  Yes [provider]  dicyclomine (BENTYL) 10 MG capsule Take 10mg  up to three times daily as needed for spasms or abdominal pain. Hold for constipation 05/22/19  Yes Mahala Menghini, PA-C  diphenhydrAMINE (BENADRYL) 25 mg capsule Take 25 mg by mouth every 6 (six) hours as needed for itching, allergies or sleep.   Yes [provider]  escitalopram (LEXAPRO) 20 MG tablet Take 20 mg by mouth at bedtime.  10/31/17  Yes [provider]  indomethacin (INDOCIN) 25 MG capsule Take 1-2 capsules by mouth  as needed for headache.  No more than six capsules per week. 06/20/19  Yes Sater, Pearletha Furlichard A, MD  mirtazapine (REMERON) 15 MG tablet Take 1 tablet (15 mg total) by mouth at bedtime. 05/10/19  Yes York Spaniel,  K, MD  naproxen (NAPROSYN) 500 MG tablet Take 1 tablet (500 mg total) by mouth 2 (two) times daily as needed for mild pain or moderate pain. 05/04/19  Yes Layne BentonBiby, Sharon L, NP  omeprazole (PRILOSEC) 20 MG capsule Take 20 mg by mouth at bedtime.  03/17/19  Yes [provider]  ondansetron (ZOFRAN-ODT) 4 MG disintegrating tablet DISSOLVE 1 TABLET(4 MG) ON THE TONGUE EVERY 8 HOURS AS NEEDED FOR NAUSEA OR VOMITING Patient taking differently: Take 4 mg by mouth every 8 (eight) hours as needed for nausea or vomiting (DISSOLVE ON THE TONGUE).  03/29/19  Yes York Spaniel,  K, MD  tiZANidine (ZANAFLEX) 2 MG tablet Take 1 tablet (2 mg total) by mouth at bedtime. 05/04/19  Yes Layne BentonBiby, Sharon L, NP  Vitamin D, Ergocalciferol, (DRISDOL) 50000 units CAPS capsule Take 50,000 Units by mouth every Sunday.  02/18/18  Yes [provider]    ROS:  Out of a complete 14 system review of symptoms, the patient  complains only of the following symptoms, and all other reviewed systems are negative.  Headache Back pain  Blood pressure 111/78, pulse 76, temperature (!) 96.8 F (36 C), height 5\' 3"  (1.6 m), weight 223 lb 8 oz (101.4 kg).  Physical Exam  General: The patient is alert and cooperative at the time of the examination.  The patient is markedly obese.  Skin: No significant peripheral edema is noted.   Neurologic Exam  Mental status: The patient is alert and oriented x 3 at the time of the examination. The patient has apparent normal recent and remote memory, with an apparently normal attention span and concentration ability.   Cranial nerves: Facial symmetry is present. Speech is normal, no aphasia or dysarthria is noted. Extraocular movements are full. Visual fields are full.  Motor: The patient has good strength in all 4 extremities.  Sensory examination: Soft touch sensation is notable for decrease on the right face, arm, and leg.  Coordination: The patient has good finger-nose-finger and heel-to-shin bilaterally.  Gait and station: The patient has a normal gait. Tandem gait is normal. Romberg is negative. No drift is seen.  Reflexes: Deep tendon reflexes are symmetric.   Assessment/Plan:  1.  Intractable migraine headache  2.  Psychogenic right hemisensory deficit  3.  History of low back pain, SI joint dysfunction  The patient is not responding to Ajovy at this point, she has been on a multitude of medications for migraine without benefit in the past.  She will be converted to Botox treatment, she will stop the Ajovy.  The patient cannot take triptan medications as a rescue drug because of recent issues with hemiplegic migraine, the nonsteroidal anti-inflammatory medications are not fully effective, the Bernita RaisinUbrelvy was denied through her insurance, for this reason I will write a prescription for Reyvow to take as needed for headache.  She will follow-up here in 3 months.  She  is missing work because of the headache currently.  Marlan Palau. Keith  MD 07/12/2019 7:50 AM  [facility] [address] [place], [place] [id number]  Phone [phone] Fax [phone]

## 2019-07-24 ENCOUNTER — Telehealth: Payer: Self-pay

## 2019-07-24 NOTE — Telephone Encounter (Signed)
Pa has been initiated for Reyvow 100 mg # 20 tabs for a 30 day supply through [place].  Interaction ID for the call is [id number] I spoke with [place]. PA ref # is [id number].   Determination should be reached within the next 24 hours.

## 2019-07-30 NOTE — Addendum Note (Signed)
Addended by: Kathrynn Ducking on: 07/30/2019 03:38 PM   Modules accepted: Orders

## 2019-07-30 NOTE — Telephone Encounter (Signed)
PA for reyvow was denied. Will notify MD. Alvia [place] is not a formulary medication at this time for Medicaid.

## 2019-07-30 NOTE — Telephone Encounter (Signed)
I called the patient.  The Reyvow was denied, as was [place].

## 2019-08-01 ENCOUNTER — Telehealth: Payer: Self-pay

## 2019-08-01 ENCOUNTER — Other Ambulatory Visit: Payer: Self-pay | Admitting: Neurology

## 2019-08-01 NOTE — Telephone Encounter (Signed)
Dr. Jannifer Franklin would like to initiate botox therapy on this pt.

## 2019-08-09 ENCOUNTER — Other Ambulatory Visit: Payer: Self-pay

## 2019-08-09 NOTE — Patient Outreach (Signed)
First attempt at mRs. Patient requested call back.

## 2019-08-13 NOTE — Telephone Encounter (Signed)
I called to schedule the patient but she did not answer so I left a VM asking her to call me back. DW  °

## 2019-08-14 ENCOUNTER — Other Ambulatory Visit: Payer: Self-pay

## 2019-08-14 NOTE — Patient Outreach (Signed)
Second attempt to obtain mRs. Patient at work. Requested call back in the morning.

## 2019-09-03 NOTE — Telephone Encounter (Signed)
I called in prescription to [facility] at [phone].

## 2019-09-05 NOTE — Telephone Encounter (Addendum)
[facility] and they stated they lost the script. I requested to speak with the pharmacist and they stated that there were accidentally two accounts for this patient and they are working on fixing it.   I need to use account [phone]. DW

## 2019-09-05 NOTE — Telephone Encounter (Signed)
[facility] Tracks authorization [id number] (01/01/20). DW

## 2019-09-06 ENCOUNTER — Ambulatory Visit (INDEPENDENT_AMBULATORY_CARE_PROVIDER_SITE_OTHER): Payer: Medicaid Other | Admitting: Neurology

## 2019-09-06 ENCOUNTER — Telehealth: Payer: Self-pay | Admitting: Neurology

## 2019-09-06 ENCOUNTER — Ambulatory Visit: Payer: Medicaid Other | Admitting: Neurology

## 2019-09-06 ENCOUNTER — Other Ambulatory Visit: Payer: Self-pay

## 2019-09-06 DIAGNOSIS — G43109 Migraine with aura, not intractable, without status migrainosus: Secondary | ICD-10-CM | POA: Diagnosis not present

## 2019-09-06 NOTE — Progress Notes (Signed)
[facility]  100units X 2 NDC [id number] LOT [id number] exp 04-2022.  Sodium chloride 0.9% X2. [place] [id number],  LOT [id number] exp 05-01-2020

## 2019-09-06 NOTE — Procedures (Signed)
     BOTOX PROCEDURE NOTE FOR MIGRAINE HEADACHE   HISTORY: [name] is a 38 year old female with history of migraine headaches.  She complains of daily headache.  Previously, she received good benefit from [place], but medication became ineffective.  She has not had daily headache.  She has been missing several days of work, and has nearly used up all her medication.  She describes her typical headache as left frontal area.  Her insurance would not pay for Ubrelvy or Reyvow.  In the past she has tried trazodone, nortriptyline, gabapentin, Lyrica, propanolol, verapamil, Cymbalta, and Ajovy.  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  Diclofenac potassium has not been fully effective.  She presents today for her first Botox injection.   Description of procedure:  The patient was placed in a sitting position. The standard protocol was used for Botox as follows, with 5 units of Botox injected at each site:   -Procerus muscle, midline injection  -Corrugator muscle, bilateral injection  -Frontalis muscle, bilateral injection, with 2 sites each side, medial injection was performed in the upper one third of the frontalis muscle, in the region vertical from the medial inferior edge of the superior orbital rim. The lateral injection was again in the upper one third of the forehead vertically above the lateral limbus of the cornea, 1.5 cm lateral to the medial injection site.  -Temporalis muscle injection, 4 sites, bilaterally. The first injection was 3 cm above the tragus of the ear, second injection site was 1.5 cm to 3 cm up from the first injection site in line with the tragus of the ear. The third injection site was 1.5-3 cm forward between the first 2 injection sites. The fourth injection site was 1.5 cm posterior to the second injection site.  -Occipitalis muscle injection, 3 sites, bilaterally. The first injection was done one half way between the  occipital protuberance and the tip of the mastoid process behind the ear. The second injection site was done lateral and superior to the first, 1 fingerbreadth from the first injection. The third injection site was 1 fingerbreadth superiorly and medially from the first injection site.  -Cervical paraspinal muscle injection, 2 sites, bilateral, the first injection site was 1 cm from the midline of the cervical spine, 3 cm inferior to the lower border of the occipital protuberance. The second injection site was 1.5 cm superiorly and laterally to the first injection site.  -Trapezius muscle injection was performed at 3 sites, bilaterally. The first injection site was in the upper trapezius muscle halfway between the inflection point of the neck, and the acromion. The second injection site was one half way between the acromion and the first injection site. The third injection was done between the first injection site and the inflection point of the neck.   A 200 unit bottle of Botox was used, 155 units were injected, the rest of the Botox was wasted. The patient tolerated the procedure well, there were no complications of the above procedure.  Botox NDC 1937-9024-09 Lot number [id number] Expiration date 04/2022

## 2019-09-06 NOTE — Telephone Encounter (Signed)
3 mos btx °

## 2019-09-10 ENCOUNTER — Ambulatory Visit: Payer: Medicaid Other | Admitting: Neurology

## 2019-09-10 NOTE — Telephone Encounter (Signed)
I called and scheduled the patient. DW  °

## 2019-09-18 ENCOUNTER — Ambulatory Visit: Payer: Medicaid Other | Admitting: Family Medicine

## 2019-10-09 ENCOUNTER — Encounter: Payer: Self-pay | Admitting: Neurology

## 2019-10-09 ENCOUNTER — Other Ambulatory Visit: Payer: Self-pay | Admitting: Gastroenterology

## 2019-10-10 ENCOUNTER — Ambulatory Visit: Payer: Medicaid Other | Admitting: Neurology

## 2019-11-05 ENCOUNTER — Ambulatory Visit: Payer: Medicaid Other | Attending: Internal Medicine

## 2019-11-05 ENCOUNTER — Other Ambulatory Visit: Payer: Self-pay

## 2019-11-05 DIAGNOSIS — Z20822 Contact with and (suspected) exposure to covid-19: Secondary | ICD-10-CM

## 2019-11-06 LAB — NOVEL CORONAVIRUS, NAA: SARS-CoV-2, NAA: DETECTED — AB

## 2019-11-07 ENCOUNTER — Other Ambulatory Visit: Payer: Self-pay | Admitting: Neurology

## 2019-12-06 ENCOUNTER — Other Ambulatory Visit: Payer: Self-pay | Admitting: Neurology

## 2019-12-07 ENCOUNTER — Other Ambulatory Visit: Payer: Self-pay

## 2019-12-07 MED ORDER — ONDANSETRON 4 MG PO TBDP: ORAL_TABLET | ORAL | 6 refills | Status: DC

## 2019-12-12 ENCOUNTER — Other Ambulatory Visit: Payer: Self-pay

## 2019-12-12 ENCOUNTER — Telehealth: Payer: Self-pay | Admitting: Neurology

## 2019-12-12 ENCOUNTER — Ambulatory Visit: Payer: Medicaid Other | Admitting: Neurology

## 2019-12-12 DIAGNOSIS — G43711 Chronic migraine without aura, intractable, with status migrainosus: Secondary | ICD-10-CM | POA: Diagnosis not present

## 2019-12-12 NOTE — Telephone Encounter (Signed)
12 weeks botox

## 2019-12-12 NOTE — Progress Notes (Signed)
Specialty Pharmacy.   BOTOX 200Units/Vial NDC 3795-5831-67 Single dose vial Lot: [id number] Exp: 94/8347 [place] 5830 RX only [id number]  Saline Bacteriostatic 0.9% Sodium Chloride NDC 7308-5694-37 Lot: [id number] Exp: 01/31/2020  Pt signed consent. Pt understands possible side effects. Patient states she did not have any blood thinners/aspirin within 24 hrs.

## 2019-12-12 NOTE — Procedures (Signed)
     BOTOX PROCEDURE NOTE FOR MIGRAINE HEADACHE   HISTORY: Mary Parker is a 39 year old female with history of migraine headache.  She presents today for her second Botox injection.  She did excellent with her first Botox.  She had more than 90% benefit with her headaches.  She was having daily headache, in the last 3 months, has only had 1 headache, was milder.  She took indomethacin with benefit.  Prior she was missing work, after Botox, she has not missed work due to headache.  She cannot believe how helpful Botox is.  She did have Covid the end of December, developed pneumonia.  Otherwise, denies any changes to her medical history.  She presents today for Botox injection.  Description of procedure:  The patient was placed in a sitting position. The standard protocol was used for Botox as follows, with 5 units of Botox injected at each site:   -Procerus muscle, midline injection  -Corrugator muscle, bilateral injection  -Frontalis muscle, bilateral injection, with 2 sites each side, medial injection was performed in the upper one third of the frontalis muscle, in the region vertical from the medial inferior edge of the superior orbital rim. The lateral injection was again in the upper one third of the forehead vertically above the lateral limbus of the cornea, 1.5 cm lateral to the medial injection site.  -Temporalis muscle injection, 4 sites, bilaterally. The first injection was 3 cm above the tragus of the ear, second injection site was 1.5 cm to 3 cm up from the first injection site in line with the tragus of the ear. The third injection site was 1.5-3 cm forward between the first 2 injection sites. The fourth injection site was 1.5 cm posterior to the second injection site.  -Occipitalis muscle injection, 3 sites, bilaterally. The first injection was done one half way between the occipital protuberance and the tip of the mastoid process behind the ear. The second injection site was done  lateral and superior to the first, 1 fingerbreadth from the first injection. The third injection site was 1 fingerbreadth superiorly and medially from the first injection site.  -Cervical paraspinal muscle injection, 2 sites, bilateral, the first injection site was 1 cm from the midline of the cervical spine, 3 cm inferior to the lower border of the occipital protuberance. The second injection site was 1.5 cm superiorly and laterally to the first injection site.  -Trapezius muscle injection was performed at 3 sites, bilaterally. The first injection site was in the upper trapezius muscle halfway between the inflection point of the neck, and the acromion. The second injection site was one half way between the acromion and the first injection site. The third injection was done between the first injection site and the inflection point of the neck.   A 200 unit bottle of Botox was used, 155 units were injected, the rest of the Botox was wasted. The patient tolerated the procedure well, there were no complications of the above procedure.  Botox NDC 6659-9357-01 Lot number [id number] Expiration date 07/2022  Specialty Pharmacy

## 2020-01-03 ENCOUNTER — Other Ambulatory Visit: Payer: Self-pay | Admitting: Gastroenterology

## 2020-01-03 ENCOUNTER — Other Ambulatory Visit: Payer: Self-pay | Admitting: Neurology

## 2020-01-10 ENCOUNTER — Encounter: Payer: Self-pay | Admitting: Neurology

## 2020-01-17 ENCOUNTER — Telehealth: Payer: Self-pay | Admitting: Neurology

## 2020-01-17 ENCOUNTER — Encounter: Payer: Self-pay | Admitting: Neurology

## 2020-01-17 MED ORDER — PREDNISONE 5 MG PO TABS: ORAL_TABLET | ORAL | 0 refills | Status: DC

## 2020-01-17 NOTE — Telephone Encounter (Signed)
Agree with plan 

## 2020-01-17 NOTE — Telephone Encounter (Signed)
I called the patient in response to her MyChart message, reporting last week she had a 4-day headache, associated with numbness of the left leg, and right face.  She went to see her primary doctor, was felt to have a complex migraine, was given samples of Ubrelvy, that relieved the headache over the weekend.  The headache returned on Monday, has now had for 4 days.  She does not have any numbness or weakness at this time.  Her primary care has referred her for second opinion at [facility].  She is unable to take triptan medication given migraine with neurological features.  She took indomethacin without benefit.  I will send in a 6-day prednisone 5 mg taper to try to break the cycle.  She has done well with Botox, I gave her Botox 2/10 second dosing. She will let me know how she does. She says she can take prednisone.

## 2020-02-27 ENCOUNTER — Telehealth: Payer: Self-pay | Admitting: *Deleted

## 2020-02-27 MED ORDER — BOTOX 100 UNITS IJ SOLR: INTRAMUSCULAR | 3 refills | Status: DC

## 2020-02-27 NOTE — Telephone Encounter (Signed)
escribed to [facility].

## 2020-02-27 NOTE — Telephone Encounter (Signed)
Please send Botox prescription to [facility].  Patient has a Botox appointment on Mar 12, 2020.  I called [employer] to check status of PA.  I spoke to [place] who states the PA on file is no longer valid.  I filled out the PA form and faxed to [phone] with clinical notes.

## 2020-02-29 NOTE — Telephone Encounter (Signed)
Mary Parker @ [facility] is asking for a call re: a diagnosis code, Please call [phone]

## 2020-03-03 ENCOUNTER — Encounter: Payer: Self-pay | Admitting: Neurology

## 2020-03-03 NOTE — Telephone Encounter (Signed)
I called [facility] [phone] to give the diagnosis code and try to schedule delivery.  They will need to speak to the patient to go over benefits and get consent.  They will call us once they speak to her to schedule delivery.  I called [place] [employer] to check the status of the PA and spoke to [place].  She states it was approved. The DX#[id number] Valid 02/28/2020-02/22/2021. Ref# for call is [id number].

## 2020-03-03 NOTE — Telephone Encounter (Signed)
I called patient and left a message that her new pharmacy for Botox will be [facility] and to be on the lookout for a call from them. She will need to give consent and go over her benefits.  I also left their number for her to call if she does not hear from them and to try to call them as soon as possible so we can schedule the delivery.

## 2020-03-04 NOTE — Telephone Encounter (Signed)
Diane with [facility] called and scheduled medication delivery for 03/11/2020

## 2020-03-07 ENCOUNTER — Other Ambulatory Visit: Payer: Self-pay | Admitting: Gastroenterology

## 2020-03-10 NOTE — Telephone Encounter (Signed)
I called [facility] [phone] and spoke to [place].  I told her I am confused because I had spoken with Diane with [facility] on 03/04/2020 and scheduled the delivery for 03/11/2020.  I was transferred to the pharmacist, Phineas Semen, who states the needed the supervising MD information before they could ship the medication since it was a NP that ordered the Botox.  I provided her with the information.  Unfortunately the Botox will not arrive in time for patients 03/12/2020 appointment. Pharmacist states she can ship and have medication here on 03/12/2020.  I called [place] and got her voicemail.  I explained the situation and rescheduled her to 03/12/2020 appointment to 03/13/2020.  Please call if this will not work for her schedule.

## 2020-03-10 NOTE — Telephone Encounter (Signed)
[facility]  Called to schedule botox delivery [phone] option 2

## 2020-03-12 ENCOUNTER — Ambulatory Visit: Payer: Medicaid Other | Admitting: Neurology

## 2020-03-12 NOTE — Telephone Encounter (Signed)
I called [facility] [phone] to check the status of the delivery since it is 1:00PM and we still do not have it.  I spoke to [facility] [place] who states they needed the supervising MD information. I told her I was very confused because all the information was provided to them on 03/10/2020 and I was told the medication could be shipped and get here today 03/12/2020.  She took the information AGAIN and asked me to hold. States she is reaching out to [employer] to find out why it was not shipped.  After a 25 minute hold the call was disconnected.  I called back and spoke to [place]. She states that she sees a note from Darl P[facility] stating it is in transit but stuck in a hub. Tracking number [id number] [employer].  I checked the tracing on the package and it is currently in [place].  We will use our Botox stock and replace when patients arrives.

## 2020-03-13 ENCOUNTER — Ambulatory Visit: Payer: Medicaid Other | Admitting: Neurology

## 2020-03-13 ENCOUNTER — Other Ambulatory Visit: Payer: Self-pay

## 2020-03-13 ENCOUNTER — Encounter: Payer: Self-pay | Admitting: Neurology

## 2020-03-13 VITALS — BP 117/75 | HR 81 | Temp 97.2°F

## 2020-03-13 DIAGNOSIS — G43711 Chronic migraine without aura, intractable, with status migrainosus: Secondary | ICD-10-CM | POA: Diagnosis not present

## 2020-03-13 NOTE — Procedures (Signed)
     BOTOX PROCEDURE NOTE FOR MIGRAINE HEADACHE   HISTORY: Mary Parker is a 39 year old female who presents today for Botox injection for chronic migraine headache.She last had Botox December 12, 2019.  Today is her third Botox injection. Her headaches are at least 80% better with migraines, aren't as intense, due to missing work.  She is pending referral for second opinion on her headaches.  She has previously tolerated Botox well.  She signed her consent form.   Description of procedure:  The patient was placed in a sitting position. The standard protocol was used for Botox as follows, with 5 units of Botox injected at each site:   -Procerus muscle, midline injection  -Corrugator muscle, bilateral injection  -Frontalis muscle, bilateral injection, with 2 sites each side, medial injection was performed in the upper one third of the frontalis muscle, in the region vertical from the medial inferior edge of the superior orbital rim. The lateral injection was again in the upper one third of the forehead vertically above the lateral limbus of the cornea, 1.5 cm lateral to the medial injection site.  -Temporalis muscle injection, 4 sites, bilaterally. The first injection was 3 cm above the tragus of the ear, second injection site was 1.5 cm to 3 cm up from the first injection site in line with the tragus of the ear. The third injection site was 1.5-3 cm forward between the first 2 injection sites. The fourth injection site was 1.5 cm posterior to the second injection site.  -Occipitalis muscle injection, 3 sites, bilaterally. The first injection was done one half way between the occipital protuberance and the tip of the mastoid process behind the ear. The second injection site was done lateral and superior to the first, 1 fingerbreadth from the first injection. The third injection site was 1 fingerbreadth superiorly and medially from the first injection site.  -Cervical paraspinal muscle  injection, 2 sites, bilateral, the first injection site was 1 cm from the midline of the cervical spine, 3 cm inferior to the lower border of the occipital protuberance. The second injection site was 1.5 cm superiorly and laterally to the first injection site.  -Trapezius muscle injection was performed at 3 sites, bilaterally. The first injection site was in the upper trapezius muscle halfway between the inflection point of the neck, and the acromion. The second injection site was one half way between the acromion and the first injection site. The third injection was done between the first injection site and the inflection point of the neck.   A 200 unit bottle of Botox was used, 155 units were injected, the rest of the Botox was wasted. The patient tolerated the procedure well, there were no complications of the above procedure.  Botox NDC 7846-9629-52 Lot number [id number] Expiration date 05/2022/09/2022 SP

## 2020-03-13 NOTE — Telephone Encounter (Signed)
Patients Botox arrived this morning.  Office stock was replaced.

## 2020-03-13 NOTE — Progress Notes (Signed)
Botox-100 units x2 vials Lot: [id number] EXP-05/2022        [id number] EXP-09/2022  NDC: 2902-1115-52   0.9% Sodium Chloride- 54mL total Lot: [id number] Expiration: 02/22 NDC: 61224-497-53  Dx: Migraines B/B or SP  Consent signed

## 2020-04-01 ENCOUNTER — Encounter (HOSPITAL_COMMUNITY): Payer: Self-pay | Admitting: Emergency Medicine

## 2020-04-01 ENCOUNTER — Emergency Department (HOSPITAL_COMMUNITY): Payer: Medicaid Other

## 2020-04-01 ENCOUNTER — Other Ambulatory Visit: Payer: Self-pay

## 2020-04-01 ENCOUNTER — Emergency Department (HOSPITAL_COMMUNITY)
Admission: EM | Admit: 2020-04-01 | Discharge: 2020-04-01 | Disposition: A | Discharge status: Home / Self Care | Payer: Medicaid Other | Attending: Emergency Medicine | Admitting: Emergency Medicine

## 2020-04-01 DIAGNOSIS — Z7982 Long term (current) use of aspirin: Secondary | ICD-10-CM | POA: Diagnosis not present

## 2020-04-01 DIAGNOSIS — Z79899 Other long term (current) drug therapy: Secondary | ICD-10-CM | POA: Insufficient documentation

## 2020-04-01 DIAGNOSIS — Z87891 Personal history of nicotine dependence: Secondary | ICD-10-CM | POA: Diagnosis not present

## 2020-04-01 DIAGNOSIS — M79672 Pain in left foot: Secondary | ICD-10-CM | POA: Insufficient documentation

## 2020-04-01 NOTE — ED Triage Notes (Signed)
Ball of left foot, pain started last week.  Pain has increased and having issues walking.  Rates pain 10/10.

## 2020-04-01 NOTE — ED Provider Notes (Signed)
[facility] EMERGENCY DEPARTMENT Provider Note   CSN: [id number] Arrival date & time: 04/01/20  1430     History Chief Complaint  Patient presents with  . Foot Pain    Mary Parker is a 39 y.o. female with a past medical history of chronic low back pain, fibromyalgia, GERD, IBS, migraines, cigarette smoking, who presents today for evaluation of pain in her left leg.  She reports that about a week ago her left heel started hurting.  She states that recently she has been wearing flip-flops more however none of the flip-flops are new.  She then has began walking on the ball of her left foot to avoid putting weight on her left heel and over the past few days has had worsening pain in the ball of her left foot, her left ankle up into her left calf.  She states that it hurts too much to put her weight on it.  She has tried elevation at home without significant relief.  No fevers, specific injury or wounds.  HPI     Past Medical History:  Diagnosis Date  . Anxiety   . Arthritis    both knees  . Body aches 12/11/2014  . Chronic insomnia   . Chronic low back pain 05/29/2015  . Common migraine 12/19/2014  . Depression 12/11/2014  . Fibrocystic disease of both breasts   . Fibromyalgia 12/19/2014  . GERD (gastroesophageal reflux disease)   . History of hiatal hernia   . IBS (irritable bowel syndrome)   . Knee pain, right   . Migraines   . Rebound headache 12/19/2014  . Right ovarian cyst 04/25/2017   Complex, check CA 125 and repeat US in 6 weeks   . Sciatica of left side   . Seizures (HCC)    remote past, felt it was related to Chantix   . Vitamin D deficiency   . Wears glasses     Patient Active Problem List   Diagnosis Date Noted  . Chronic migraine without aura, with intractable migraine, so stated, with status migrainosus 07/12/2019  . Cigarette smoker 05/04/2019  . Obesity, Class II, BMI 35-39.9 05/04/2019  . Stroke-like episode (HCC) s/p tPA 05/03/2019  . Acute  gastroenteritis 03/29/2019  . 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 S/P abdominal supracervical subtotal hysterectomy 06/21/2017  . Pelvic adhesions 06/09/2017  . History of ovarian cyst 06/09/2017  . Right ovarian cyst 04/25/2017  . Encounter for gynecological examination with Papanicolaou smear of cervix 04/19/2017  . Rectal bleeding 04/02/2017  . LLQ pain 04/01/2017  . Constipation 04/01/2017  . Concussion with loss of consciousness 11/16/2016  . Anxiety 10/14/2016  . Chronic low back pain 05/29/2015  . Complicated migraine 12/19/2014  . Fibromyalgia 12/19/2014  . Rebound headache 12/19/2014  . Depression 12/11/2014  . Body aches 12/11/2014    Past Surgical History:  Procedure Laterality Date  . BILATERAL SALPINGECTOMY Bilateral 06/21/2017   Procedure: BILATERAL SALPINGECTOMY;  Surgeon: Tilda Burrow, MD;  Location: [facility];  Service: Gynecology;  Laterality: Bilateral;  . CESAREAN SECTION  [phone]  . CHOLECYSTECTOMY  2001  . COLONOSCOPY WITH PROPOFOL N/A 04/07/2017   Dr. Lovena Neighbours: Normal, abnormal CT likely artifactual  . ENDOMETRIAL ABLATION    . ESOPHAGOGASTRODUODENOSCOPY  2002   [facility]: normal  . KNEE ARTHROSCOPY WITH LATERAL RELEASE Right 04/18/2018   Procedure: Right knee arthroscopy with chondroplasty and lateral release;  Surgeon: Yolonda Kida, MD;  Location[facility];  Service: Orthopedics;  Laterality: Right;  60 mins  . SUPRACERVICAL ABDOMINAL HYSTERECTOMY N/A 06/21/2017   Procedure: HYSTERECTOMY SUPRACERVICAL ABDOMINAL;  Surgeon: Tilda Burrow, MD;  Location: [facility];  Service: Gynecology;  Laterality: N/A;  . TUBAL LIGATION       OB History    Gravida  3   Para  3   Term  3   Preterm      AB      Living  3     SAB      TAB      Ectopic      Multiple      Live Births  3           Family History  Problem Relation Age of Onset  . Hypertension Mother   . Other Mother        abnormal cells; had  hyst  . Obesity Daughter   . Diabetes Daughter 30       youngest  . Obesity Son   . Cancer Maternal Grandmother   . Osteoporosis Maternal Grandmother   . Cancer Maternal Grandfather   . Obesity Daughter   . Diabetes Father   . Hypertension Father   . Alcohol abuse Father   . Colon cancer Neg Hx     Social History   Tobacco Use  . Smoking status: Former Smoker    Packs/day: 0.25    Years: 25.00    Pack years: 6.25    Types: Cigarettes    Quit date: 04/02/2019    Years since quitting: 1.0  . Smokeless tobacco: Never Used  Substance Use Topics  . Alcohol use: No  . Drug use: No    Home Medications Prior to Admission medications   Medication Sig Start Date End Date Taking? Authorizing Provider  acetaminophen (TYLENOL) 500 MG tablet Take 500-1,000 mg by mouth every 6 (six) hours as needed for mild pain or headache.    [provider]  albuterol (PROVENTIL) (2.5 MG/3ML) 0.083% nebulizer solution Take 2.5 mg by nebulization every 6 (six) hours as needed for wheezing or shortness of breath.  02/21/18   [provider]  ALPRAZolam Prudy Feeler) 0.5 MG tablet Take 0.5 mg by mouth at bedtime as needed for sleep.  10/06/18   [provider]  aspirin 81 MG chewable tablet Chew 81 mg by mouth daily.    [provider]  botulinum toxin Type A (BOTOX) 100 units SOLR injection Inject 155units IM into head and neck muscles every 3 months by the provider. 02/27/20   Glean Salvo, NP  budesonide-formoterol [facility]) 160-4.5 MCG/ACT inhaler Inhale 2 puffs into the lungs 2 (two) times daily as needed (for flares).     [provider]  celecoxib (CELEBREX) 100 MG capsule Take 100 mg by mouth 2 (two) times daily. 01/02/20   [provider]  cyanocobalamin (,VITAMIN B-12,) 1000 MCG/ML injection Inject 1,000 mcg into the skin every 30 (thirty) days.  02/17/18   [provider]  dicyclomine (BENTYL) 10 MG capsule TAKE 1 CAPSULE BY MOUTH UP TO THREE  TIMES DAILY AS NEEDED FOR SPASMS OR ABDOMINAL PAIN 03/10/20   Gelene Mink, NP  diphenhydrAMINE (BENADRYL) 25 mg capsule Take 25 mg by mouth every 6 (six) hours as needed for itching, allergies or sleep.    [provider]  escitalopram (LEXAPRO) 20 MG tablet Take 20 mg by mouth at bedtime.  10/31/17   [provider]  indomethacin (INDOCIN) 25 MG capsule TAKE 1 TO  2 CAPSULES BY MOUTH AS NEEDED FOR HEADACHE. NO MORE THAN 6 CAPSULES PER WEEK 11/08/19   York Spaniel, MD  mirtazapine (REMERON) 15 MG tablet TAKE 1 TABLET(15 MG) BY MOUTH AT BEDTIME 01/03/20   York Spaniel, MD  omeprazole (PRILOSEC) 20 MG capsule Take 20 mg by mouth at bedtime.  03/17/19   [provider]  ondansetron (ZOFRAN-ODT) 4 MG disintegrating tablet DISSOLVE 1 TABLET(4 MG) ON THE TONGUE EVERY 8 HOURS AS NEEDED FOR NAUSEA OR VOMITING 12/07/19   York Spaniel, MD  tiZANidine (ZANAFLEX) 2 MG tablet Take 1 tablet (2 mg total) by mouth at bedtime. 05/04/19   Layne Benton, NP  Vitamin D, Ergocalciferol, (DRISDOL) 50000 units CAPS capsule Take 50,000 Units by mouth every Sunday.  02/18/18   [provider]    Allergies    Bupropion, Duloxetine hcl, Gabapentin, Meloxicam, Pregabalin, Amitriptyline, Ciprofloxacin, Desvenlafaxine succinate er, Hydrocodone-acetaminophen, Milnacipran, Nortriptyline, Topiramate, Decadron [dexamethasone], Doxycycline, and Percocet [oxycodone-acetaminophen]  Review of Systems   Review of Systems  Constitutional: Negative for chills and fever.  Musculoskeletal:       Pain and swelling around the left lower leg  Skin: Negative for wound.  All other systems reviewed and are negative.   Physical Exam Updated Vital Signs BP 140/69   Pulse 68   Temp 98.3 F (36.8 C) (Oral)   Resp 17   Ht 5\' 3"  (1.6 m)   Wt 110.2 kg   SpO2 100%   BMI 43.05 kg/m   Physical Exam Vitals and nursing note reviewed.  Constitutional:      General: She is not in acute distress.     Appearance: She is not ill-appearing.  HENT:     Head: Normocephalic.  Cardiovascular:     Rate and Rhythm: Normal rate.     Pulses: Normal pulses.     Comments: Left leg 2+ DP/PT pulse. Pulmonary:     Effort: Pulmonary effort is normal. No respiratory distress.  Musculoskeletal:     Left lower leg: Edema (Mild, primarily around ankle) present.     Comments: There is mild edema around the left ankle posteriorly.  There is tenderness to palpation in the left calf on the posterior lateral aspect without crepitus or deformity.  There is pain with pressure over the arch of the left foot.  The Achilles tendon is palpable, grossly intact on exam foot and ankle are grossly stable on my exam.  Skin:    General: Skin is warm and dry.     Comments: No wounds, abnormal erythema or warmth present over left lower leg and foot.   Neurological:     Mental Status: She is alert.     Sensory: No sensory deficit (Sensation intact to light touch to left leg.).     ED Results / Procedures / Treatments   Labs (all labs ordered are listed, but only abnormal results are displayed) Labs Reviewed - No data to display  EKG None  Radiology Venous Img Lower  Left (DVT Study)  Result Date: 04/01/2020 CLINICAL DATA:  Acute left lower extremity pain. EXAM: Left LOWER EXTREMITY VENOUS DOPPLER ULTRASOUND TECHNIQUE: Gray-scale sonography with compression, as well as color and duplex ultrasound, were performed to evaluate the deep venous system(s) from the level of the common femoral vein through the popliteal and proximal calf veins. COMPARISON:  None. FINDINGS: VENOUS Normal compressibility of the common femoral, superficial femoral, and popliteal veins, as well as the visualized calf veins. Visualized portions of  profunda femoral vein and great saphenous vein unremarkable. No filling defects to suggest DVT on grayscale or color Doppler imaging. Doppler waveforms show normal direction of venous flow, normal  respiratory plasticity and response to augmentation. Limited views of the contralateral common femoral vein are unremarkable. OTHER None. Limitations: none IMPRESSION: Negative. Electronically Signed   By: Marijo Conception M.D.   On: 04/01/2020 16:55   DG Foot Complete Left  Result Date: 04/01/2020 CLINICAL DATA:  Pain in ball of foot EXAM: LEFT FOOT - COMPLETE 3+ VIEW COMPARISON:  None. FINDINGS: Plantar and posterior calcaneal spurs. No acute bony abnormality. Specifically, no fracture, subluxation, or dislocation. Joint spaces maintained. Soft tissues unremarkable. IMPRESSION: No acute bony abnormality. Calcaneal spurs. Electronically Signed   By: Rolm Baptise M.D.   On: 04/01/2020 16:48    Procedures Procedures (including critical care time)  Medications Ordered in ED Medications - No data to display  ED Course  I have reviewed the triage vital signs and the nursing notes.  Pertinent labs & imaging results that were available during my care of the patient were reviewed by me and considered in my medical decision making (see chart for details).    MDM Rules/Calculators/A&P                     Patient presents today for evaluation of over a week of left foot pain.  The pain was initially in the heel of her left foot.  Because of this she was walking differently when attempting to walk on the ball of the left foot which then became sore with swelling in the calf.  Given that she has been less mobile recently due to her foot pain with pain and swelling in the calf and leg DVT study was performed without evidence of DVT.  X-rays of the foot were obtained showing plantars without other abnormalities.  Clinically exam is not consistent with infection.  I suspect that she has plantar fasciitis due to change in footwear wearing sandals that do not offer adequate support.  With this pain causing her to walk on the ball of her foot I suspect the cause secondary strain on the remainder of the foot, ankle  and lower leg causing the remainder of her symptoms.  We did discuss other possible causes including stress fracture.  Patient has multiple allergies.  She is currently on NSAIDs at home.  Recommended taking Tylenol as needed.  Clinically no indication for narcotic pain medicine.  We discussed conservative measures for treatment of plantar fasciitis.  She is given crutches for comfort.  Recommended podiatry follow-up or orthopedic follow-up.  Return precautions were discussed with patient who states their understanding.  At the time of discharge patient denied any unaddressed complaints or concerns.  Patient is agreeable for discharge home.  Note: Portions of this report may have been transcribed using voice recognition software. Every effort was made to ensure accuracy; however, inadvertent computerized transcription errors may be present  Final Clinical Impression(s) / ED Diagnoses Final diagnoses:  Left foot pain    Rx / DC Orders ED Discharge Orders    None       Ollen Gross 04/01/20 2327    Margette Fast, MD 04/02/20 1343

## 2020-04-01 NOTE — Discharge Instructions (Addendum)
Please take Tylenol (acetaminophen) to relieve your pain.  You may take tylenol, up to 1,000 mg (two extra strength pills).  Do not take more than 3,000 mg tylenol in a 24 hour period.  Please check all medication labels as many medications such as pain and cold medications may contain tylenol. Please do not drink alcohol while taking this medication.  ° °

## 2020-05-23 ENCOUNTER — Telehealth: Payer: Self-pay | Admitting: Neurology

## 2020-05-23 NOTE — Telephone Encounter (Signed)
Pt called stating that even after the injections she received her migraines continue to persist. She would like to be advised on what else she can do to relieve this pain.

## 2020-05-24 ENCOUNTER — Other Ambulatory Visit: Payer: Self-pay | Admitting: Gastroenterology

## 2020-05-26 NOTE — Telephone Encounter (Signed)
Reviewed chart, 3rd botox in May, at that time reported botox improved headaches 80%, looks like she previously tried Aimovig, was that helpful? Could try Ajovy or Emgality. Tried multitude of medications: propranolol, Lyrica, gabapentin, Cymbalta, topamax, tizanidine, nortriptyline. Could consider referral to headache clinic if she is open to that.

## 2020-05-26 NOTE — Telephone Encounter (Signed)
I called pt and LMVM for her that I returned her call, this about botox not helping with her migraines.  Please advise.

## 2020-06-05 ENCOUNTER — Telehealth: Payer: Self-pay | Admitting: Neurology

## 2020-06-05 NOTE — Telephone Encounter (Signed)
Please see my chart message sent below regarding Botox pharmacy:  You may be right that it's just close to end of botox cycle. I do see [facility] on next Monday. But the [facility] pharmacy just called to do my refill for botox as I'm supose to come in for botox on 16th i think. However with the new managed care thru medicaid [facility] isnt on my pharmacy list. I now have [facility], same id number as medicaid. [facility] said it may need to be sent to [facility], can you check to see which pharmacy my botox needs to goto. As I'd still like to keep the botox going unless [facility] says something else, because thus far the botox has helped the best of everything we have tried. Please let me know what you think or if i need to do anything else.

## 2020-06-11 MED ORDER — BOTOX 100 UNITS IJ SOLR: INTRAMUSCULAR | 3 refills | Status: DC

## 2020-06-11 NOTE — Telephone Encounter (Signed)
Done

## 2020-06-11 NOTE — Telephone Encounter (Signed)
I have sent the patient a MyChart message. I will call insurance and find out what the issue is. I know that Medicaid just changed pretty much everything so a lot of people are getting confused on this.

## 2020-06-11 NOTE — Telephone Encounter (Signed)
Can you send Botox prescription to [facility]?

## 2020-06-11 NOTE — Addendum Note (Signed)
Addended by: Guy Begin on: 06/11/2020 04:55 PM   Modules accepted: Orders

## 2020-06-18 NOTE — Telephone Encounter (Signed)
I spoke with Mary Parker at [place] to schedule Botox delivery. She states Botox will arrive tomorrow, 8/19 for patient's 8/23 appointment.

## 2020-06-19 NOTE — Telephone Encounter (Signed)
(  2) 100U vials of Botox arrived today for patient's 8/23 appointment from [facility].

## 2020-06-23 ENCOUNTER — Encounter: Payer: Self-pay | Admitting: Neurology

## 2020-06-23 ENCOUNTER — Other Ambulatory Visit: Payer: Self-pay

## 2020-06-23 ENCOUNTER — Ambulatory Visit (INDEPENDENT_AMBULATORY_CARE_PROVIDER_SITE_OTHER): Payer: Medicaid Other | Admitting: Neurology

## 2020-06-23 VITALS — BP 130/76 | HR 65 | Ht 64.0 in | Wt 243.0 lb

## 2020-06-23 DIAGNOSIS — G43711 Chronic migraine without aura, intractable, with status migrainosus: Secondary | ICD-10-CM

## 2020-06-23 DIAGNOSIS — G43019 Migraine without aura, intractable, without status migrainosus: Secondary | ICD-10-CM

## 2020-06-23 NOTE — Procedures (Signed)
     BOTOX PROCEDURE NOTE FOR MIGRAINE HEADACHE   HISTORY: Ms. Wessner is a 39 year old female with history of chronic migraine headaches.  She presents today for Botox injection.  Botox has significantly improved her headaches, at least 70% benefit.  Since her last Botox injection, she indicates she has had only 2 significant spells of migraine.  She received a second opinion at [facility], a sleep study was recommended.  A consent form was signed.   Description of procedure:  The patient was placed in a sitting position. The standard protocol was used for Botox as follows, with 5 units of Botox injected at each site:   -Procerus muscle, midline injection  -Corrugator muscle, bilateral injection  -Frontalis muscle, bilateral injection, with 2 sites each side, medial injection was performed in the upper one third of the frontalis muscle, in the region vertical from the medial inferior edge of the superior orbital rim. The lateral injection was again in the upper one third of the forehead vertically above the lateral limbus of the cornea, 1.5 cm lateral to the medial injection site.  -Temporalis muscle injection, 4 sites, bilaterally. The first injection was 3 cm above the tragus of the ear, second injection site was 1.5 cm to 3 cm up from the first injection site in line with the tragus of the ear. The third injection site was 1.5-3 cm forward between the first 2 injection sites. The fourth injection site was 1.5 cm posterior to the second injection site.  -Occipitalis muscle injection, 3 sites, bilaterally. The first injection was done one half way between the occipital protuberance and the tip of the mastoid process behind the ear. The second injection site was done lateral and superior to the first, 1 fingerbreadth from the first injection. The third injection site was 1 fingerbreadth superiorly and medially from the first injection site.  -Cervical paraspinal muscle injection, 2 sites,  bilateral, the first injection site was 1 cm from the midline of the cervical spine, 3 cm inferior to the lower border of the occipital protuberance. The second injection site was 1.5 cm superiorly and laterally to the first injection site.  -Trapezius muscle injection was performed at 3 sites, bilaterally. The first injection site was in the upper trapezius muscle halfway between the inflection point of the neck, and the acromion. The second injection site was one half way between the acromion and the first injection site. The third injection was done between the first injection site and the inflection point of the neck.   A 200 unit bottle of Botox was used, 155 units were injected, the rest of the Botox was wasted. The patient tolerated the procedure well, there were no complications of the above procedure.  Botox NDC [phone] Lot number [id number] Expiration date 10/23  She was referred for sleep evaluation at [facility] due to chronic headache, obesity, snoring, daytime fatigue, r/o OSA.

## 2020-06-23 NOTE — Progress Notes (Signed)
Botox- 200 units x 2 vials Lot: [id number] Expiration: 10/23 NDC: 9563-8756-43   Bacteriostatic 0.9% Sodium Chloride- 20mL total [id number] Expiration: 02/22  NDC: 60630-160-10   S/P

## 2020-07-22 ENCOUNTER — Encounter: Payer: Self-pay | Admitting: Internal Medicine

## 2020-07-23 IMAGING — CR RIGHT HAND - COMPLETE 3+ VIEW
1 series · 6 of 6 positions shown · non-contrast
Comparison: Wrist radiographs dated 06/29/2015

CLINICAL DATA: Right wrist and forearm pain after a fall in the
shower today.

EXAM:
RIGHT HAND - COMPLETE 3+ VIEW

[Series 1: pa · 0.17mm/px · 6 of 6 slices shown]
[im 1/6]
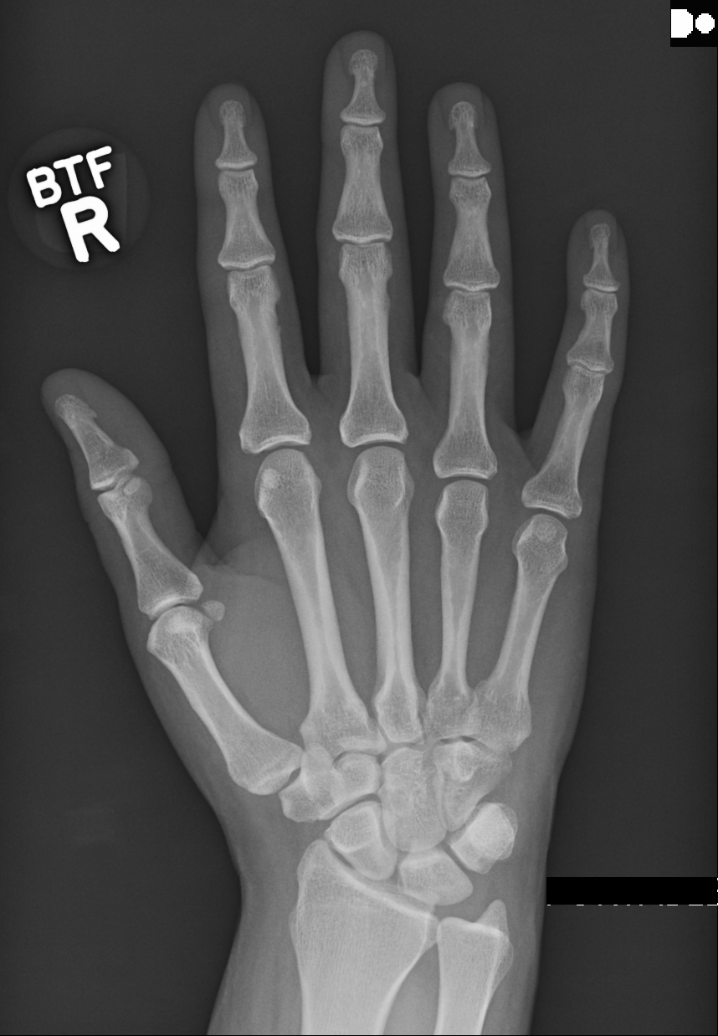
[im 2/6]
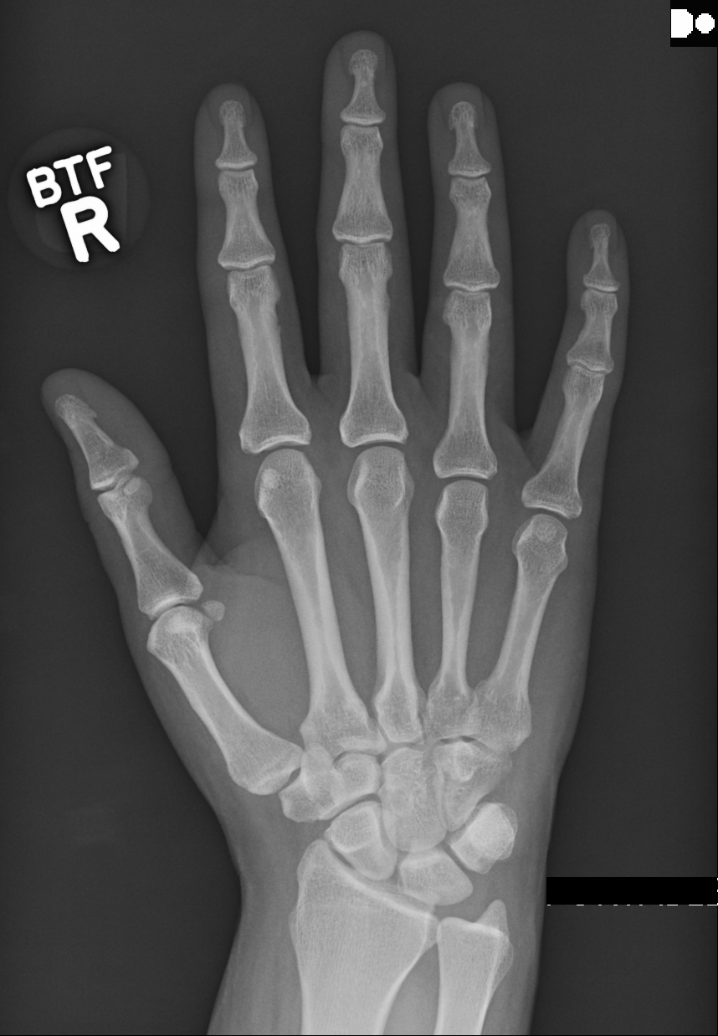
[im 3/6]
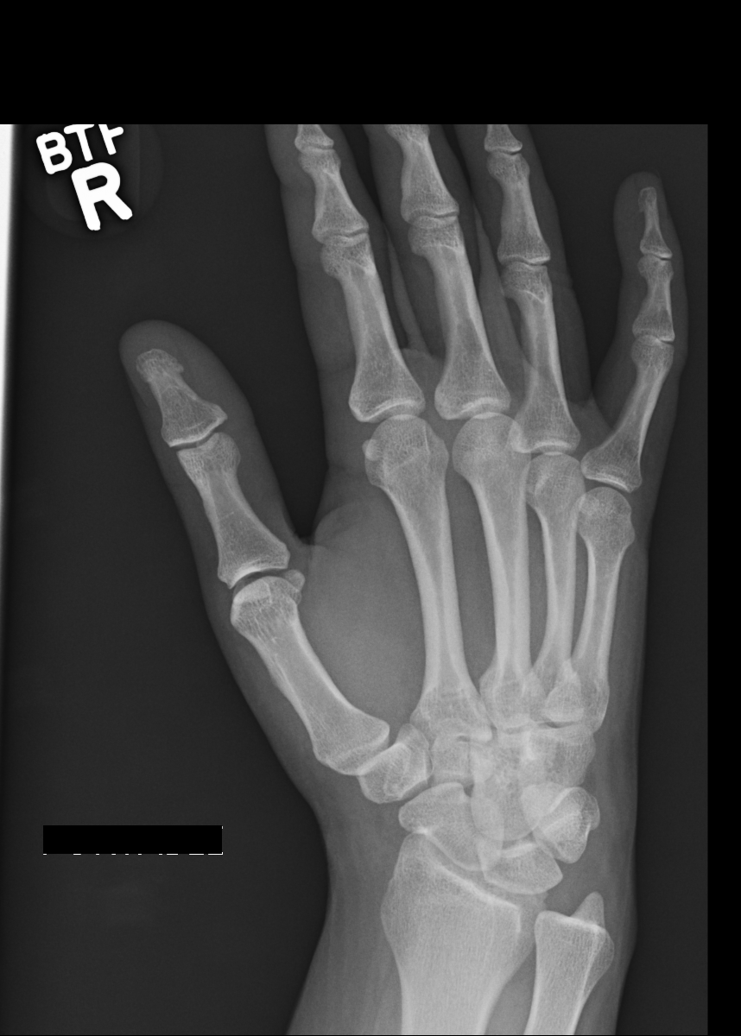
[im 4/6]
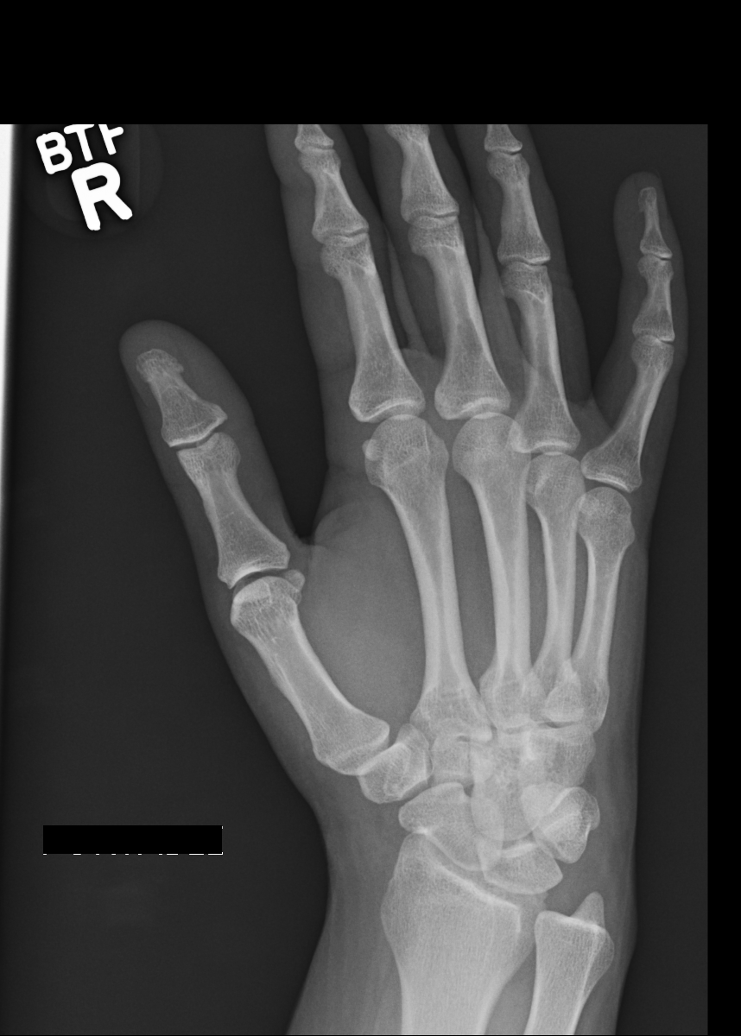
[im 5/6]
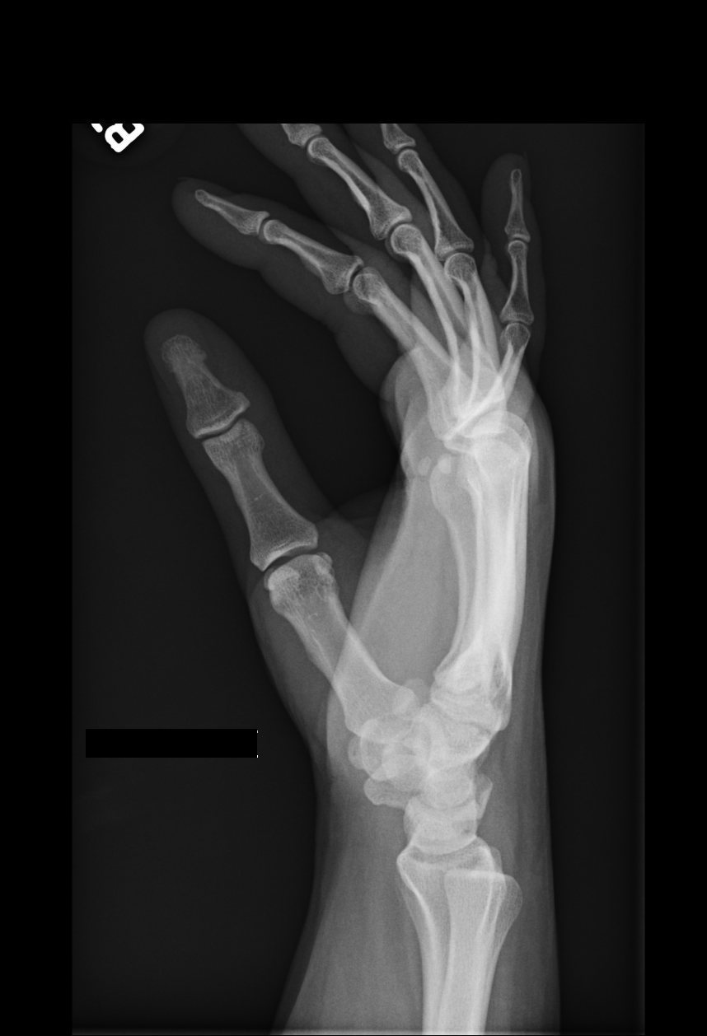
[im 6/6]
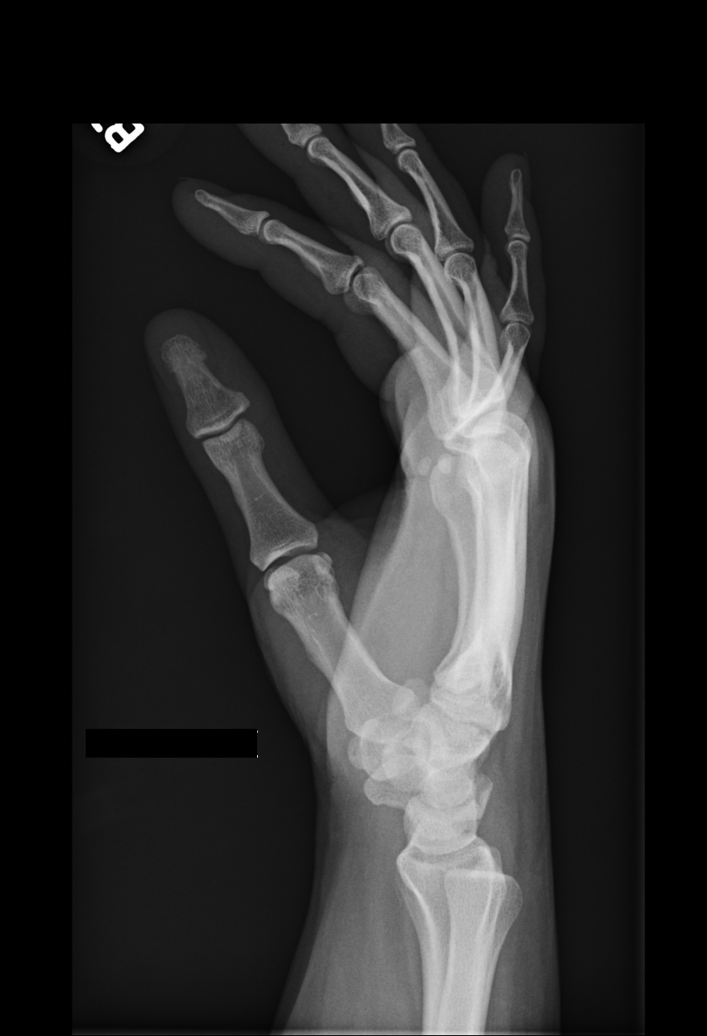

[6 of 6 positions shown; findings below may reference images not displayed]

FINDINGS: There is no evidence of fracture or dislocation. There is no
evidence of arthropathy or other focal bone abnormality. Soft
tissues are unremarkable.
IMPRESSION: Normal exam.

## 2020-07-23 IMAGING — CR RIGHT FOREARM - 2 VIEW
1 series · 4 of 4 positions shown · non-contrast
Comparison: None.

CLINICAL DATA: Right wrist and forearm pain status post fall

EXAM:
RIGHT FOREARM - 2 VIEW

[Series 1: ap · 0.17mm/px · 4 of 4 slices shown]
[im 1/4]
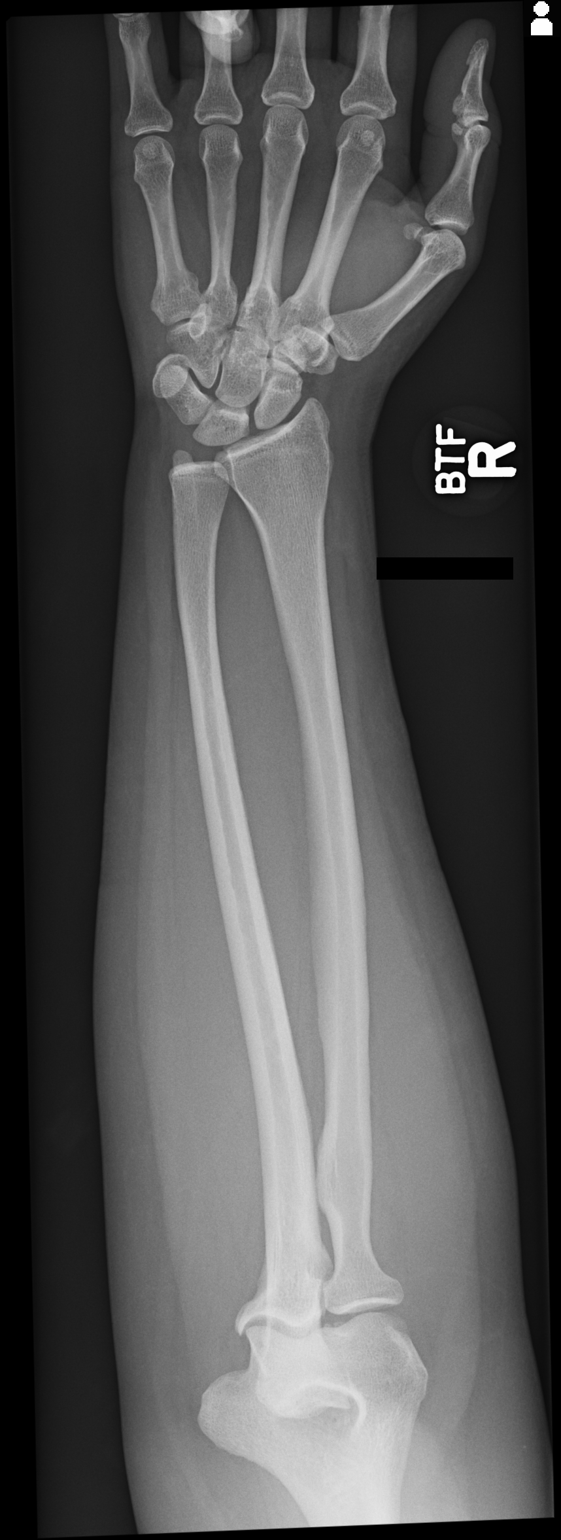
[im 2/4]
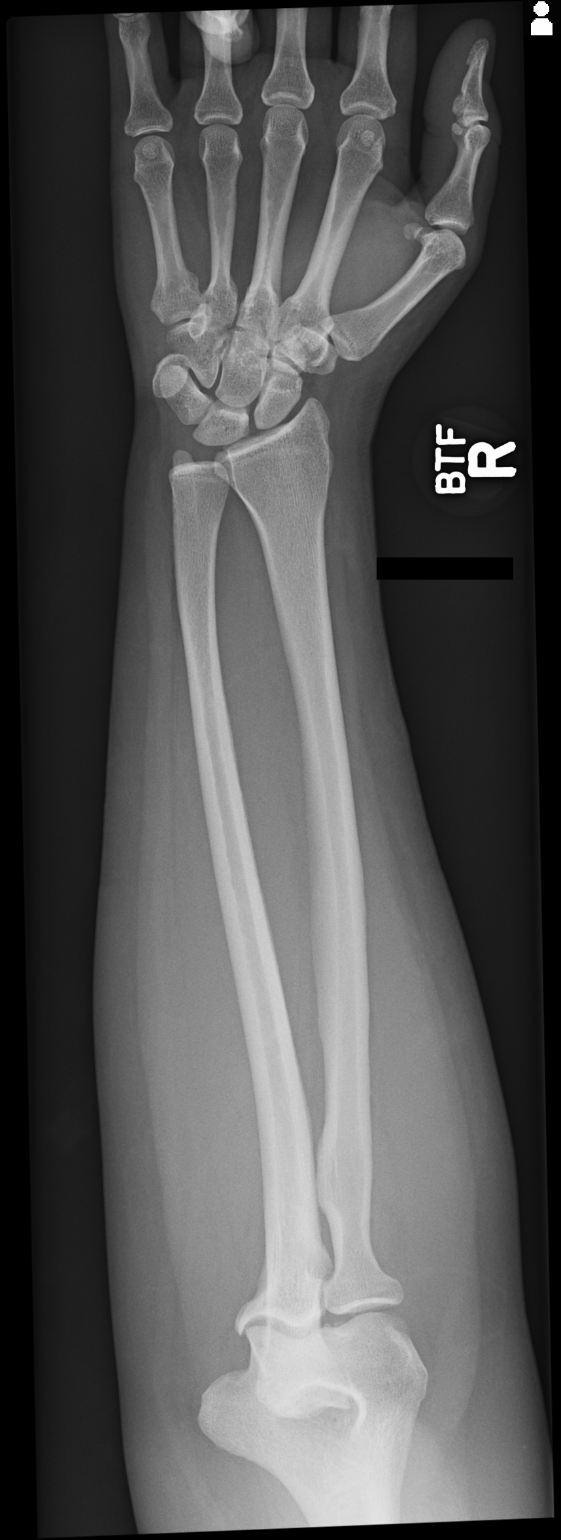
[im 3/4]
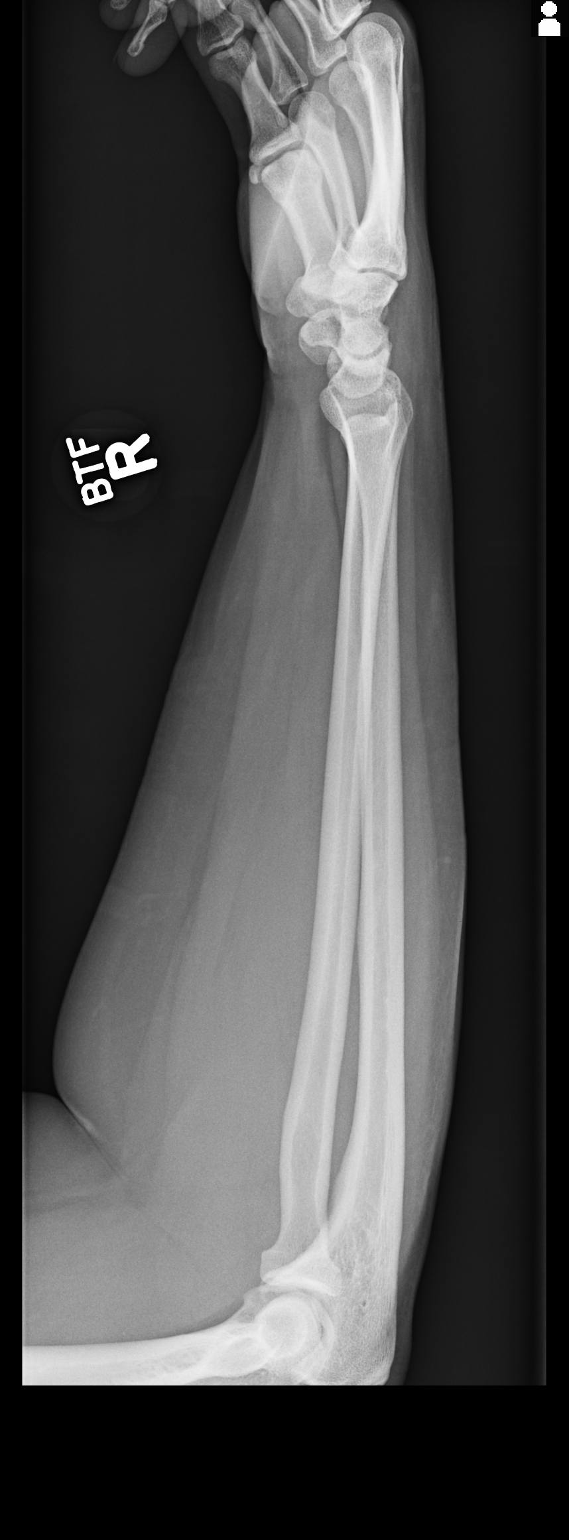
[im 4/4]
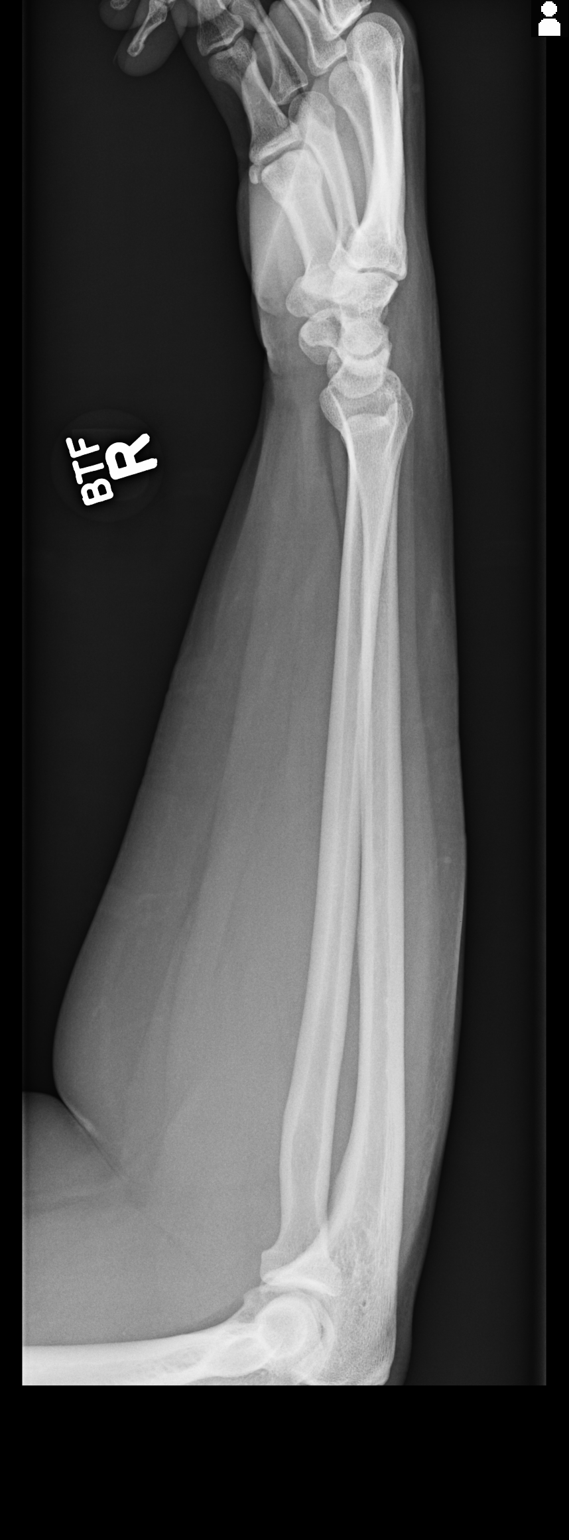

[4 of 4 positions shown; findings below may reference images not displayed]

FINDINGS: There is no evidence of fracture or other focal bone lesions. Soft
tissues are unremarkable.
IMPRESSION: Negative.

## 2020-07-30 ENCOUNTER — Other Ambulatory Visit: Payer: Self-pay

## 2020-07-30 ENCOUNTER — Ambulatory Visit (INDEPENDENT_AMBULATORY_CARE_PROVIDER_SITE_OTHER): Payer: Medicaid Other | Admitting: Neurology

## 2020-07-30 ENCOUNTER — Encounter: Payer: Self-pay | Admitting: Neurology

## 2020-07-30 VITALS — BP 127/89 | HR 74 | Ht 63.0 in | Wt 230.0 lb

## 2020-07-30 DIAGNOSIS — G43109 Migraine with aura, not intractable, without status migrainosus: Secondary | ICD-10-CM | POA: Diagnosis not present

## 2020-07-30 DIAGNOSIS — F5104 Psychophysiologic insomnia: Secondary | ICD-10-CM | POA: Insufficient documentation

## 2020-07-30 DIAGNOSIS — R0683 Snoring: Secondary | ICD-10-CM

## 2020-07-30 DIAGNOSIS — Z6841 Body Mass Index (BMI) 40.0 and over, adult: Secondary | ICD-10-CM | POA: Insufficient documentation

## 2020-07-30 DIAGNOSIS — G4719 Other hypersomnia: Secondary | ICD-10-CM

## 2020-07-30 DIAGNOSIS — F513 Sleepwalking [somnambulism]: Secondary | ICD-10-CM | POA: Diagnosis not present
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
## 2020-07-30 NOTE — Patient Instructions (Signed)
Hypersomnia Hypersomnia is a condition in which a person feels very tired during the day even though he or she gets plenty of sleep at night. A person with this condition may take naps during the day and may find it very difficult to wake up from sleep. Hypersomnia may affect a person's ability to think, concentrate, drive, or remember things. What are the causes? The cause of this condition may not be known. Possible causes include:  Certain medicines.  Sleep disorders, such as narcolepsy and sleep apnea.  Injury to the head, brain, or spinal cord.  Drug or alcohol use.  Gastroesophageal reflux disease (GERD).  Tumors.  Certain medical conditions, such as depression, diabetes, or an underactive thyroid gland (hypothyroidism). What are the signs or symptoms? The main symptoms of hypersomnia include:  Feeling very tired throughout the day, regardless of how much sleep you got the night before.  Having trouble waking up. Others may find it difficult to wake you up when you are sleeping.  Sleeping for longer and longer periods at a time.  Taking naps throughout the day. Other symptoms may include:  Feeling restless, anxious, or annoyed.  Lacking energy.  Having trouble with: ? Remembering. ? Speaking. ? Thinking.  Loss of appetite.  Seeing, hearing, tasting, smelling, or feeling things that are not real (hallucinations). How is this diagnosed? This condition may be diagnosed based on:  Your symptoms and medical history.  Your sleeping habits. Your health care provider may ask you to write down your sleeping habits in a daily sleep log, along with any symptoms you have.  A series of tests that are done while you sleep (sleep study or polysomnogram).  A test that measures how quickly you can fall asleep during the day (daytime nap study or multiple sleep latency test). How is this treated? Treatment can help you manage your condition. Treatment may  include:  Following a regular sleep routine.  Lifestyle changes, such as changing your eating habits, getting regular exercise, and avoiding alcohol or caffeinated beverages.  Taking medicines to make you more alert (stimulants) during the day.  Treating any underlying medical causes of hypersomnia. Follow these instructions at home: Sleep routine   Schedule the same bedtime and wake-up time each day.  Practice a relaxing bedtime routine. This may include reading, meditation, deep breathing, or taking a warm bath before going to sleep.  Get regular exercise each day. Avoid strenuous exercise in the evening hours.  Keep your sleep environment at a cooler temperature, darkened, and quiet.  Sleep with pillows and a mattress that are comfortable and supportive.  Schedule short 20-minute naps for when you feel sleepiest during the day.  Talk with your employer or teachers about your hypersomnia. If possible, adjust your schedule so that: ? You have a regular daytime work schedule. ? You can take a scheduled nap during the day. ? You do not have to work or be active at night.  Do not eat a heavy meal for a few hours before bedtime. Eat your meals at about the same times every day.  Avoid drinking alcohol or caffeinated beverages. Safety   Do not drive or use heavy machinery if you are sleepy. Ask your health care provider if it is safe for you to drive.  Wear a life jacket when swimming or spending time near water. General instructions  Take supplements and over-the-counter and prescription medicines only as told by your health care provider.  Keep a sleep log that will help   your doctor manage your condition. This may include information about: ? What time you go to bed each night. ? How often you wake up at night. ? How many hours you sleep at night. ? How often and for how long you nap during the day. ? Any observations from others, such as leg movements during sleep,  sleep walking, or snoring.  Keep all follow-up visits as told by your health care provider. This is important. Contact a health care provider if:  You have new symptoms.  Your symptoms get worse. Get help right away if:  You have serious thoughts about hurting yourself or someone else. If you ever feel like you may hurt yourself or others, or have thoughts about taking your own life, get help right away. You can go to your nearest emergency department or call:  Your local emergency services (911 in the [place]  A suicide crisis helpline, such as the National Suicide Prevention Lifeline at 820-731-6784. This is open 24 hours a day. Summary  Hypersomnia refers to a condition in which you feel very tired during the day even though you get plenty of sleep at night.  A person with this condition may take naps during the day and may find it very difficult to wake up from sleep.  Hypersomnia may affect a person's ability to think, concentrate, drive, or remember things.  Treatment, such as following a regular sleep routine and making some lifestyle changes, can help you manage your condition. This information is not intended to replace advice given to you by your health care provider. Make sure you discuss any questions you have with your health care provider. Document Revised: 10/20/2017 Document Reviewed: 10/20/2017 Elsevier Patient Education  2020 [employer] Post-Traumatic Stress Disorder, Adult Post-traumatic stress disorder (PTSD) is a mental health disorder that can occur after a traumatic event, such as a threat to life, serious injury, or sexual violence. Sometimes, PTSD can occur in people who hear about trauma that occurs to a close family member or friend. PTSD can happen to anyone at any age. What are the causes? The condition may be caused by experiencing a traumatic event. What increases the risk? This condition is more likely to occur in:  Medical illustrator.  People who are in circumstances where their lives are threatened.  People who have been the victim of, or witness to, a traumatic event, such as: ? Domestic violence. ? Physical or sexual abuse. ? Rape. ? A terrorist act or gun violence. ? Natural disasters. ? Accidents involving serious injury. What are the signs or symptoms? PTSD symptoms may start soon after a frightening event or months or years later. Symptoms last at least one month and tend to disrupt relationships, work, and daily activities. Symptoms of PTSD can be grouped into several categories. Intrusive symptoms This is when you re-experience the physical and emotional sensations of the traumatic event through one or more of the following ways:  Having upsetting dreams.  Feeling fear, horror, intense sadness, or anger in response to a reminder of the trauma.  Having unwanted upsetting memories while awake.  Having physical reactions triggered by reminders of the trauma, such as increased heart rate, shortness of breath, sweating, and shaking.  Having flashbacks, or feeling like you are going through the event again. Avoidance symptoms This is when you avoid anything that reminds you of the trauma. Symptoms may also include:  Losing interest or not participating in daily activities.  Feeling disconnected from or  avoiding other people.  Isolating yourself. Increased arousal symptoms You may have physical or emotional reactions triggered by your environment. Symptoms may include:  Being easily startled.  Behaving in a careless or self-destructive way.  Becoming easily irritated.  Feeling worried and nervous.  Having trouble concentrating.  Yelling at or hitting other people or objects.  Having trouble sleeping. Negative mood and thoughts  Believing that you or others are bad.  Feeling fear, horror, anger, sadness, guilt, or shame regularly.  Not being able to remember certain parts of  the traumatic event.  Blaming yourself or others for the trauma.  Being unable to experience positive emotions, such as happiness or love. How is this diagnosed? PTSD is diagnosed through an assessment by a mental health professional. Bonita Quin will be asked questions about your symptoms. How is this treated? Treatment for this condition may include any of the following or a combination:  Taking medicines to reduce PTSD symptoms.  Having counseling with a mental health professional or therapist who is experienced in treating PTSD.  Doing eye movement desensitization and reprocessing therapy (EMDR). This type of therapy occurs with a specialized therapist. If you have other mental health concerns, these conditions will also be treated. Follow these instructions at home: Lifestyle  Find a support group in your community. Groups are often available for [employer] veterans, trauma victims, and family members or caregivers.  Try to get 7-9 hours of sleep each night. To help with sleep: ? Keep your bedroom cool and dark. ? Do not eat a heavy meal within 1 hour of bedtime. ? Do not drink alcohol or caffeinated drinks before bed. ? Avoid screen time, such as television, computers, tablets, or mobile phones, before bed.  Do not use illegal drugs.  Contact a local organization to find out if you are eligible for a service dog. Activity  Exercise regularly. Try to do at least 30 minutes of physical activity most days of the week.  Practice self-calming through: ? Breathing exercises. ? Meditation. ? Yoga. ? Listening to quiet music.  Do not isolate yourself. Make connections with other people.  Consider volunteering. Volunteering can help you feel more connected. Eating and drinking  Do not drink alcohol if: ? Your health care provider tells you not to drink. ? You are pregnant, may be pregnant, or are planning to become pregnant.  If you drink alcohol: ? Limit how much you use  to:  0-1 drink a day for women.  0-2 drinks a day for men. ? Be aware of how much alcohol is in your drink. In the [place] one drink equals one 12 oz bottle of beer (355 mL), one 5 oz glass of wine (148 mL), or one 1 oz glass of hard liquor (44 mL). General instructions  Take steps to help yourself feel safer at home, such as by installing a security system.  Work with a health care provider or therapist to help manage your symptoms.  Take over-the-counter and prescription medicines as told by your health care provider.  Let others know that you have PTSD and the things that may trigger symptoms. This can protect you and help them understand you better.  If your PTSD is affecting your marriage or family, seek help from a family therapist.  Make sure to let all of your health care providers know you have PTSD. This is especially important if you are having surgery or need to be admitted to the hospital.  Keep all follow-up visits as told  by your health care provider. This is important. Contact a health care provider if:  Your symptoms do not get better.  You are feeling overwhelmed by your symptoms. Get help right away if:  You have thoughts of hurting yourself or others. If you ever feel like you may hurt yourself or others, or have thoughts about taking your own life, get help right away. You can go to your nearest emergency department or call:  Your local emergency services (911 in the [place]  A suicide crisis helpline, such as the National Suicide Prevention Lifeline at 913-517-8133. This is open 24 hours a day. Summary  Post-traumatic stress disorder (PTSD) is a mental health disorder that can occur after a traumatic event.  Treatment for PTSD may include medicines, counseling, eye movement desensitization and reprocessing therapy (EMDR), or a combination of therapies.  Find a support group in your community.  Get help right away if you have thoughts of hurting yourself  or others. This information is not intended to replace advice given to you by your health care provider. Make sure you discuss any questions you have with your health care provider. Document Revised: 12/28/2018 Document Reviewed: 12/28/2018 Elsevier Patient Education  2020 [employer].

## 2020-08-01 ENCOUNTER — Emergency Department (HOSPITAL_COMMUNITY)
Admission: EM | Admit: 2020-08-01 | Discharge: 2020-08-01 | Disposition: A | Discharge status: Home / Self Care | Payer: Medicaid Other | Attending: Emergency Medicine | Admitting: Emergency Medicine

## 2020-08-01 ENCOUNTER — Other Ambulatory Visit: Payer: Self-pay | Admitting: Neurology

## 2020-08-01 ENCOUNTER — Encounter (HOSPITAL_COMMUNITY): Payer: Self-pay | Admitting: Emergency Medicine

## 2020-08-01 ENCOUNTER — Emergency Department (HOSPITAL_COMMUNITY): Payer: Medicaid Other

## 2020-08-01 ENCOUNTER — Other Ambulatory Visit: Payer: Self-pay

## 2020-08-01 DIAGNOSIS — Z79899 Other long term (current) drug therapy: Secondary | ICD-10-CM | POA: Insufficient documentation

## 2020-08-01 DIAGNOSIS — Z87891 Personal history of nicotine dependence: Secondary | ICD-10-CM | POA: Diagnosis not present

## 2020-08-01 DIAGNOSIS — Z8669 Personal history of other diseases of the nervous system and sense organs: Secondary | ICD-10-CM | POA: Insufficient documentation

## 2020-08-01 DIAGNOSIS — G510 Bell's palsy: Secondary | ICD-10-CM | POA: Insufficient documentation

## 2020-08-01 DIAGNOSIS — R29810 Facial weakness: Secondary | ICD-10-CM | POA: Diagnosis present

## 2020-08-01 LAB — COMPREHENSIVE METABOLIC PANEL
ALT: 21 U/L (ref 0–44)
AST: 24 U/L (ref 15–41)
Albumin: 3.8 g/dL (ref 3.5–5.0)
Alkaline Phosphatase: 49 U/L (ref 38–126)
Anion gap: 9 (ref 5–15)
BUN: 7 mg/dL (ref 6–20)
CO2: 23 mmol/L (ref 22–32)
Calcium: 8.9 mg/dL (ref 8.9–10.3)
Chloride: 104 mmol/L (ref 98–111)
Creatinine, Ser: 0.66 mg/dL (ref 0.44–1.00)
GFR calc Af Amer: >60 mL/min (ref >60)
GFR calc non Af Amer: >60 mL/min (ref >60)
Glucose, Bld: 109 mg/dL — ABNORMAL HIGH (ref 70–99)
Potassium: 3.5 mmol/L (ref 3.5–5.1)
Sodium: 136 mmol/L (ref 135–145)
Total Bilirubin: 0.6 mg/dL (ref 0.3–1.2)
Total Protein: 7.1 g/dL (ref 6.5–8.1)

## 2020-08-01 LAB — CBC WITH DIFFERENTIAL/PLATELET
Abs Immature Granulocytes: 0.02 10*3/uL (ref 0.00–0.07)
Basophils Absolute: 0 10*3/uL (ref 0.0–0.1)
Basophils Relative: 1 %
Eosinophils Absolute: 0.2 10*3/uL (ref 0.0–0.5)
Eosinophils Relative: 2 %
HCT: 41.3 % (ref 36.0–46.0)
Hemoglobin: 13 g/dL (ref 12.0–15.0)
Immature Granulocytes: 0 %
Lymphocytes Relative: 14 %
Lymphs Abs: 1 10*3/uL (ref 0.7–4.0)
MCH: 29.3 pg (ref 26.0–34.0)
MCHC: 31.5 g/dL (ref 30.0–36.0)
MCV: 93 fL (ref 80.0–100.0)
Monocytes Absolute: 0.4 10*3/uL (ref 0.1–1.0)
Monocytes Relative: 6 %
Neutro Abs: 5.4 10*3/uL (ref 1.7–7.7)
Neutrophils Relative %: 77 %
Platelets: 227 10*3/uL (ref 150–400)
RBC: 4.44 MIL/uL (ref 3.87–5.11)
RDW: 13.6 % (ref 11.5–15.5)
WBC: 7.1 10*3/uL (ref 4.0–10.5)
nRBC: 0 % (ref 0.0–0.2)

## 2020-08-01 LAB — CBG MONITORING, ED: Glucose-Capillary: 109 mg/dL — ABNORMAL HIGH (ref 70–99)

## 2020-08-01 MED ORDER — PREDNISONE 20 MG PO TABS
ORAL_TABLET | ORAL | 0 refills | Status: DC
Start: 2020-08-01 — End: 2020-10-15

## 2020-08-01 MED ORDER — VALACYCLOVIR HCL 1 G PO TABS
1000.0000 mg | ORAL_TABLET | Freq: Three times a day (TID) | ORAL | 0 refills | Status: AC
Start: 2020-08-01 — End: 2020-08-08

## 2020-08-01 NOTE — ED Triage Notes (Addendum)
Pt reports migraine x3 days. Pt reports woke up at 7am unable to move right side of face. Pt alert and oriented. Speech clear. Pt reports was given 0.5 mg of xanax from family prior to arrival due to having a "panic attack" prior to arrival. LKW 9pm. Pt repots bilateral hand tingling and received TPA x1 year ago. Pt ambulated to stretcher with steady gait. nad noted.

## 2020-08-01 NOTE — Discharge Instructions (Addendum)
Follow up with your neurologist next week.

## 2020-08-01 NOTE — ED Provider Notes (Signed)
[facility] EMERGENCY DEPARTMENT Provider Note   CSN: [id number] Arrival date & time: 08/01/20  0747     History Chief Complaint  Patient presents with  . Facial Droop    Mary Parker is a 39 y.o. female.  Patient has a history of migraines.  She had a headache for 3 days and now has right-sided facial numbness and weakness.  This is happened to her before.  The headache has improved.  It was decided last time that she was having symptoms from migraine  The history is provided by the patient and medical records. No language interpreter was used.  Headache Pain location:  R parietal Quality:  Dull Radiates to:  Does not radiate Severity currently:  4/10 Severity at highest:  7/10 Onset quality:  Sudden Timing:  Constant Progression:  Waxing and waning Chronicity:  Recurrent Similar to prior headaches: yes   Associated symptoms: no abdominal pain, no back pain, no congestion, no cough, no diarrhea, no fatigue, no seizures and no sinus pressure        Past Medical History:  Diagnosis Date  . Anxiety   . Arthritis    both knees  . Body aches 12/11/2014  . Chronic insomnia   . Chronic low back pain 05/29/2015  . Common migraine 12/19/2014  . Depression 12/11/2014  . Fibrocystic disease of both breasts   . Fibromyalgia 12/19/2014  . GERD (gastroesophageal reflux disease)   . History of hiatal hernia   . IBS (irritable bowel syndrome)   . Knee pain, right   . Migraines   . Rebound headache 12/19/2014  . Right ovarian cyst 04/25/2017   Complex, check CA 125 and repeat US in 6 weeks   . Sciatica of left side   . Seizures (HCC)    remote past, felt it was related to Chantix   . Vitamin D deficiency   . Wears glasses     Patient Active Problem List   Diagnosis Date Noted  . Sleep walking and eating 07/30/2020  . Excessive daytime sleepiness 07/30/2020  . Class 3 severe obesity due to excess calories without serious comorbidity with body mass index (BMI) of 40.0 to  44.9 in adult (HCC) 07/30/2020  . Psychophysiological insomnia 07/30/2020  . Loud snoring 07/30/2020  . Chronic migraine without aura, with intractable migraine, so stated, with status migrainosus 07/12/2019  . Cigarette smoker 05/04/2019  . Obesity, Class II, BMI 35-39.9 05/04/2019  . Stroke-like episode (HCC) s/p tPA 05/03/2019  . Acute gastroenteritis 03/29/2019  . 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 S/P abdominal supracervical subtotal hysterectomy 06/21/2017  . Pelvic adhesions 06/09/2017  . History of ovarian cyst 06/09/2017  . Right ovarian cyst 04/25/2017  . Encounter for gynecological examination with Papanicolaou smear of cervix 04/19/2017  . Rectal bleeding 04/02/2017  . LLQ pain 04/01/2017  . Constipation 04/01/2017  . Concussion with loss of consciousness 11/16/2016  . Anxiety 10/14/2016  . Chronic low back pain 05/29/2015  . Complicated migraine 12/19/2014  . Fibromyalgia 12/19/2014  . Rebound headache 12/19/2014  . Depression 12/11/2014  . Body aches 12/11/2014    Past Surgical History:  Procedure Laterality Date  . BILATERAL SALPINGECTOMY Bilateral 06/21/2017   Procedure: BILATERAL SALPINGECTOMY;  Surgeon: Tilda Burrow, MD;  Location: [facility];  Service: Gynecology;  Laterality: Bilateral;  . CESAREAN SECTION  [phone]  . CHOLECYSTECTOMY  2001  . COLONOSCOPY WITH PROPOFOL N/A 04/07/2017   Dr. Lovena Neighbours: Normal, abnormal CT likely artifactual  . ENDOMETRIAL  ABLATION    . ESOPHAGOGASTRODUODENOSCOPY  2002   [facility]: normal  . KNEE ARTHROSCOPY WITH LATERAL RELEASE Right 04/18/2018   Procedure: Right knee arthroscopy with chondroplasty and lateral release;  Surgeon: Yolonda Kidaogers, Jason Patrick, MD;  [facility];  Service: Orthopedics;  Laterality: Right;  60 mins  . SUPRACERVICAL ABDOMINAL HYSTERECTOMY N/A 06/21/2017   Procedure: HYSTERECTOMY SUPRACERVICAL ABDOMINAL;  Surgeon: Tilda BurrowFerguson, John V, MD;  Location: [facility];  Service: Gynecology;   Laterality: N/A;  . TUBAL LIGATION       OB History    Gravida  3   Para  3   Term  3   Preterm      AB      Living  3     SAB      TAB      Ectopic      Multiple      Live Births  3           Family History  Problem Relation Age of Onset  . Hypertension Mother   . Other Mother        abnormal cells; had hyst  . Obesity Daughter   . Diabetes Daughter [id number]       youngest  . Obesity Son   . Cancer Maternal Grandmother   . Osteoporosis Maternal Grandmother   . Cancer Maternal Grandfather   . Obesity Daughter   . Diabetes Father   . Hypertension Father   . Alcohol abuse Father   . Colon cancer Neg Hx     Social History   Tobacco Use  . Smoking status: Former Smoker    Packs/day: 0.25    Years: 25.00    Pack years: 6.25    Types: Cigarettes    Quit date: 04/02/2019    Years since quitting: 1.3  . Smokeless tobacco: Never Used  Vaping Use  . Vaping Use: Never used  Substance Use Topics  . Alcohol use: No  . Drug use: No    Home Medications Prior to Admission medications   Medication Sig Start Date End Date Taking? Authorizing Provider  ALPRAZolam Prudy Feeler(XANAX) 0.5 MG tablet Take 0.5 mg by mouth at bedtime as needed for sleep.  10/06/18  Yes [provider]  budesonide-formoterol (SYMBICORT) 160-4.5 MCG/ACT inhaler Inhale 2 puffs into the lungs 2 (two) times daily as needed (for flares).    Yes [provider]  cyanocobalamin (,VITAMIN B-12,) 1000 MCG/ML injection Inject 1,000 mcg into the skin every 30 (thirty) days.  02/17/18  Yes [provider]  dicyclomine (BENTYL) 10 MG capsule TAKE 1 CAPSULE BY MOUTH UP TO THREE TIMES DAILY AS NEEDED FOR SPASMS OR ABDOMINAL PAIN Patient taking differently: Take 10 mg by mouth 3 (three) times daily as needed for spasms.  05/28/20  Yes Gelene MinkBoone, Anna W, NP  escitalopram (LEXAPRO) 20 MG tablet Take 20 mg by mouth at bedtime.  10/31/17  Yes [provider]  indomethacin (INDOCIN) 25 MG  capsule TAKE 1 TO 2 CAPSULES BY MOUTH AS NEEDED FOR HEADACHE. NO MORE THAN 6 CAPSULES PER WEEK Patient taking differently: Take 25-50 mg by mouth daily as needed (headache). NO MORE THAN 6 CAPSULES PER WEEK 11/08/19  Yes York SpanielWillis, Charles K, MD  omeprazole (PRILOSEC) 20 MG capsule Take 20 mg by mouth 3 times/day as needed-between meals & bedtime (heartburn).  03/17/19  Yes [provider]  ondansetron (ZOFRAN-ODT) 4 MG disintegrating tablet DISSOLVE 1 TABLET(4 MG) ON THE TONGUE EVERY 8  HOURS AS NEEDED FOR NAUSEA OR VOMITING Patient taking differently: Take 4 mg by mouth every 8 (eight) hours as needed for nausea or vomiting.  12/07/19  Yes York Spaniel, MD  tiZANidine (ZANAFLEX) 2 MG tablet Take 1 tablet (2 mg total) by mouth at bedtime. 05/04/19  Yes Layne Benton, NP  Vitamin D, Ergocalciferol, (DRISDOL) 50000 units CAPS capsule Take 50,000 Units by mouth every Sunday.  02/18/18  Yes [provider]  botulinum toxin Type A (BOTOX) 100 units SOLR injection Inject 155units IM into head and neck muscles every 3 months by the provider. 06/11/20   Glean Salvo, NP  predniSONE (DELTASONE) 20 MG tablet Take 3 pills a day 08/01/20   Bethann Berkshire, MD  valACYclovir (VALTREX) 1000 MG tablet Take 1 tablet (1,000 mg total) by mouth 3 (three) times daily for 7 days. 08/01/20 08/08/20  Bethann Berkshire, MD    Allergies    Bupropion, Duloxetine hcl, Gabapentin, Meloxicam, Pregabalin, Amitriptyline, Ciprofloxacin, Desvenlafaxine succinate er, Hydrocodone-acetaminophen, Milnacipran, Nortriptyline, Topiramate, Decadron [dexamethasone], Doxycycline, and Percocet [oxycodone-acetaminophen]  Review of Systems   Review of Systems  Constitutional: Negative for appetite change and fatigue.  HENT: Negative for congestion, ear discharge and sinus pressure.   Eyes: Negative for discharge.  Respiratory: Negative for cough.   Cardiovascular: Negative for chest pain.  Gastrointestinal: Negative for abdominal  pain and diarrhea.  Genitourinary: Negative for frequency and hematuria.  Musculoskeletal: Negative for back pain.  Skin: Negative for rash.  Neurological: Positive for headaches. Negative for seizures.       Facial droop on the right  Psychiatric/Behavioral: Negative for hallucinations.    Physical Exam Updated Vital Signs BP (!) 110/53   Pulse 69   Temp 99.2 F (37.3 C) (Oral)   Resp 15   Ht 5\' 3"  (1.6 m)   Wt 104 kg   SpO2 98%   BMI 40.61 kg/m   Physical Exam Vitals and nursing note reviewed.  Constitutional:      Appearance: She is well-developed.  HENT:     Head: Normocephalic.     Nose: Nose normal.  Eyes:     General: No scleral icterus.    Conjunctiva/sclera: Conjunctivae normal.  Neck:     Thyroid: No thyromegaly.  Cardiovascular:     Rate and Rhythm: Normal rate and regular rhythm.     Heart sounds: No murmur heard.  No friction rub. No gallop.   Pulmonary:     Breath sounds: No stridor. No wheezing or rales.  Chest:     Chest wall: No tenderness.  Abdominal:     General: There is no distension.     Tenderness: There is no abdominal tenderness. There is no rebound.  Musculoskeletal:        General: Normal range of motion.     Cervical back: Neck supple.  Lymphadenopathy:     Cervical: No cervical adenopathy.  Skin:    Findings: No erythema or rash.  Neurological:     Mental Status: She is alert and oriented to person, place, and time.     Motor: No abnormal muscle tone.     Coordination: Coordination normal.     Comments: Mild facial droop on the right consistent with Bell's palsy  Psychiatric:        Behavior: Behavior normal.     ED Results / Procedures / Treatments   Labs (all labs ordered are listed, but only abnormal results are displayed) Labs Reviewed  COMPREHENSIVE METABOLIC PANEL - Abnormal;  Notable for the following components:      Result Value   Glucose, Bld 109 (*)    All other components within normal limits  CBG  MONITORING, ED - Abnormal; Notable for the following components:   Glucose-Capillary 109 (*)    All other components within normal limits  CBC WITH DIFFERENTIAL/PLATELET    EKG None  Radiology MR BRAIN WO CONTRAST  Result Date: 08/01/2020 CLINICAL DATA:  Neuro deficit, migraine. EXAM: MRI HEAD WITHOUT CONTRAST TECHNIQUE: Multiplanar, multiecho pulse sequences of the brain and surrounding structures were obtained without intravenous contrast. COMPARISON:  05/04/2019 head CT and prior. 05/03/2019 MRI head and prior. FINDINGS: Please note that motion artifact limits evaluation. Brain: No focal parenchymal signal abnormality. No acute infarct or intracranial hemorrhage. No midline shift, ventriculomegaly or extra-axial fluid collection. No mass lesion. Vascular: Normal flow voids. Skull and upper cervical spine: Normal marrow signal. Sinuses/Orbits: Normal orbits. Clear paranasal sinuses. No mastoid effusion. Other: None. IMPRESSION: No acute intracranial process.  Normal noncontrast MRI brain. Electronically Signed   By: Stana Bunting M.D.   On: 08/01/2020 10:14    Procedures Procedures (including critical care time)  Medications Ordered in ED Medications - No data to display  ED Course  I have reviewed the triage vital signs and the nursing notes.  Pertinent labs & imaging results that were available during my care of the patient were reviewed by me and considered in my medical decision making (see chart for details).    MDM Rules/Calculators/A&P                          MRI was negative.  Suspect migraine variant.   Patient will be placed on Valtrex and prednisone for Bell's palsy and follow-up with her neurologist      This patient presents to the ED for concern of headache, this involves an extensive number of treatment options, and is a complaint that carries with it a high risk of complications and morbidity.  The differential diagnosis includes migraine stroke   Lab  Tests:   I Ordered, reviewed, and interpreted labs, which included CBC chemistries which were unremarkable  Medicines ordered:     Imaging Studies ordered:   I ordered imaging studies which included MRI  I independently visualized and interpreted imaging which showed negative  Additional history obtained:   Additional history obtained from record  Previous records obtained and reviewed.  Consultations Obtained:     Reevaluation:  After the interventions stated above, I reevaluated the patient and found mild improvement  Critical Interventions:  .   Final Clinical Impression(s) / ED Diagnoses Final diagnoses:  Bell's palsy    Rx / DC Orders ED Discharge Orders         Ordered    predniSONE (DELTASONE) 20 MG tablet        08/01/20 1127    valACYclovir (VALTREX) 1000 MG tablet  3 times daily        08/01/20 1127           Bethann Berkshire, MD 08/06/20 [phone]

## 2020-08-06 ENCOUNTER — Ambulatory Visit (INDEPENDENT_AMBULATORY_CARE_PROVIDER_SITE_OTHER): Payer: Medicaid Other | Admitting: Neurology

## 2020-08-06 ENCOUNTER — Other Ambulatory Visit: Payer: Self-pay

## 2020-08-06 DIAGNOSIS — G4733 Obstructive sleep apnea (adult) (pediatric): Secondary | ICD-10-CM

## 2020-08-06 DIAGNOSIS — Z6841 Body Mass Index (BMI) 40.0 and over, adult: Secondary | ICD-10-CM

## 2020-08-06 DIAGNOSIS — R0683 Snoring: Secondary | ICD-10-CM

## 2020-08-06 DIAGNOSIS — G43109 Migraine with aura, not intractable, without status migrainosus: Secondary | ICD-10-CM

## 2020-08-06 DIAGNOSIS — F513 Sleepwalking [somnambulism]: Secondary | ICD-10-CM

## 2020-08-06 DIAGNOSIS — F5104 Psychophysiologic insomnia: Secondary | ICD-10-CM

## 2020-08-06 DIAGNOSIS — G4719 Other hypersomnia: Secondary | ICD-10-CM

## 2020-08-20 ENCOUNTER — Encounter: Payer: Self-pay | Admitting: Neurology

## 2020-08-20 ENCOUNTER — Telehealth: Payer: Self-pay | Admitting: Neurology

## 2020-08-20 NOTE — Telephone Encounter (Signed)
-----   Message from Melvyn Novas, MD sent at 08/20/2020 11:57 AM EDT ----- IMPRESSION:   1. Moderate-severe Obstructive Sleep Apnea (OSA), The total  APNEA/HYPOPNEA INDEX (AHI) was 29.1/h consistent of mostly  hypopneas- with strong REM sleep dependence. The REM AHI was   75.2 /hour.  2. Snoring  3. Some hypoxia was recorded, a possible cause of morning  headaches or cluster headaches.  4. High sleep efficiency and short sleep latency. No sleep walking/ talking/ eating was observed.     RECOMMENDATIONS:   1. Advise full night, attended, CPAP titration study to optimize  therapy.   2. If insurance will not permit this, will need to start on  autotitration CPAP, mask of patient's comfort and choice, heated  humidification, 5-15 cm water, 2 cm EPR.

## 2020-08-20 NOTE — Progress Notes (Signed)
IMPRESSION:   1. Moderate-severe Obstructive Sleep Apnea (OSA), The total  APNEA/HYPOPNEA INDEX (AHI) was 29.1/h consistent of mostly  hypopneas- with strong REM sleep dependence. The REM AHI was   75.2 /hour.  2. Snoring  3. Some hypoxia was recorded, a possible cause of morning  headaches or cluster headaches.  4. High sleep efficiency and short sleep latency. No sleep walking/ talking/ eating was observed.     RECOMMENDATIONS:   1. Advise full night, attended, CPAP titration study to optimize  therapy.   2. If insurance will not permit this, will need to start on  autotitration CPAP, mask of patient's comfort and choice, heated  humidification, 5-15 cm water, 2 cm EPR.

## 2020-08-20 NOTE — Procedures (Signed)
PATIENT'S NAME:  Rosi, Secrist  DOB:      07-May-1981      MR#:    [id number]     DATE OF RECORDING: 08/06/2020 REFERRING M.D.:  C.Keith Anne Hahn, MD  Study Performed:   Baseline Polysomnogram HISTORY:    I have the pleasure of seeing LISAMARIE COKE on 07-30-2020, a right handed - Caucasian female with a medical history of Anxiety, Arthritis, Body aches (12/11/2014), Chronic insomnia, Chronic low back pain (05/29/2015), Common migraine (12/19/2014), Depression (12/11/2014), Fibrocystic disease of both breasts, Fibromyalgia (12/19/2014), GERD (gastroesophageal reflux disease), History of hiatal hernia, IBS (irritable bowel syndrome), Knee pain, right, Migraines, Rebound headache (12/19/2014), Right ovarian cyst (04/25/2017), Sciatica of left side,  Vitamin D deficiency, and Wears glasses.. she is chronically nauseated, has photophobia and lost 15 pounds since her last visit with [facility].    HISTORY OF PRESENT ILLNESS:  RABIAH GOESER is a 39 y.o. year old White or Caucasian female patient seen here upon a consultation requested by NP on 07/30/2020 (from Margie Ege, NP and Dr. Lesia Sago).   Chief concern according to patient:  Headaches, obesity, and migraines since teenage. The patient has last been seen at [facility] by Margie Ege on 06-23-20 and she received a Botox injection which has significantly improved her headaches she reports a benefit of a 70% reduction.  She had only 2 significant spells of migraines within 6 weeks.  She received a second opinion at [facility] and a sleep study was recommended.  Sarah injected the procerus muscle corrugator muscle frontalis and temporalis occipitalis, the cervical paraspinal muscles and the trapezius.  200 units of Botox were used.  She had been doing quite well previously on Ajovy reducing her headaches to once a month but then the Ajovy became ineffective.  She has worked for 2 jobs but has missed about 11 days of work since July 2020.  She has failed medication such as  trazodone, nortriptyline, topiramate, gabapentin, Lyrica, propranolol, verapamil, and Cymbalta.  The patient endorsed the Epworth Sleepiness Scale at 16 points.   The patient's weight 229 pounds with a height of 63 (inches), resulting in a BMI of 40.6 kg/m2. The patient's neck circumference measured 15 inches.  CURRENT MEDICATIONS: Vitamin D, Zanaflex, Zofran-ODT, Prilosec, Indocin, Lexapro, Benadryl, Vit B12, Symbicort, Botox, Xanax, Proventil, Tylenol. No sleep aid was given.    PROCEDURE:  This is a multichannel digital polysomnogram utilizing the Somnostar 11.2 system.  Electrodes and sensors were applied and monitored per AASM Specifications.   EEG, EOG, Chin and Limb EMG, were sampled at 200 Hz.  ECG, Snore and Nasal Pressure, Thermal Airflow, Respiratory Effort, CPAP Flow and Pressure, Oximetry was sampled at 50 Hz. Digital video and audio were recorded.      BASELINE STUDY: Lights Out was at 23:30 and Lights On at 05:00.  Total recording time (TRT) was 330.5 minutes, with a total sleep time (TST) of 309 minutes.   The patient's sleep latency was 8 minutes.  REM latency was 104 minutes.  The sleep efficiency was 93.5 %.     SLEEP ARCHITECTURE: WASO (Wake after sleep onset) was 11.5 minutes.  There were 21.5 minutes in Stage N1, 221 minutes Stage N2, 21 minutes Stage N3 and 45.5 minutes in Stage REM.  The percentage of Stage N1 was 7.%, Stage N2 was 71.5%, Stage N3 was 6.8% and Stage R (REM sleep) was 14.7%.     RESPIRATORY ANALYSIS:  There were a total of 150 respiratory events:  8 obstructive apneas, 0 central apneas and 0 mixed apneas with a total of 8 apneas and an apnea index (AI) of 1.6 /hour. There were 142 hypopneas with a hypopnea index of 27.6 /hour.      The total APNEA/HYPOPNEA INDEX (AHI) was 29.1/h.  57 events occurred in REM sleep and 186 events in NREM. The REM AHI was  75.2 /hour, versus a non-REM AHI of 21.2. The patient spent 165 minutes of total sleep time in the supine  position and 144 minutes in non-supine. The supine AHI was 25.8 versus a non-supine AHI of 32.9/h.  OXYGEN SATURATION & C02:  The Wake baseline 02 saturation was 94%, with the lowest being 81%. Time spent below 89% saturation equaled 12 minutes.  The arousals were noted as: 57 were spontaneous, 0 were associated with PLMs, 95 were associated with respiratory events. The patient had a total of 0 Periodic Limb Movements.   Audio and video analysis did not show any abnormal or unusual movements, behaviors, phonations or vocalizations.  Snoring was noted. EKG was in keeping with normal sinus rhythm (NSR).    IMPRESSION:  1. Moderate-severe Obstructive Sleep Apnea (OSA), The total APNEA/HYPOPNEA INDEX (AHI) was 29.1/h consistent of mostly hypopneas- with strong REM sleep dependence. The REM AHI was  75.2 /hour. 2. Snoring  3. Some hypoxia was recorded, a possible cause of morning headaches or cluster headaches.  4. High sleep efficiency and short sleep latency.     RECOMMENDATIONS:  1. Advise full night, attended, CPAP titration study to optimize therapy.   2. If insurance will not permit this, will need to start on autotitration CPAP, mask of patient's comfort and choice, heated humidification, 5-15 cm water, 2 cm EPR.     I certify that I have reviewed the entire raw data recording prior to the issuance of this report in accordance with the Standards of Accreditation of the American Academy of Sleep Medicine (AASM  Melvyn Novas, MD Diplomat, American Board of Psychiatry and Neurology  Diplomat, American Board of Sleep Medicine Wellsite geologist, [employer] [employer]

## 2020-08-20 NOTE — Telephone Encounter (Signed)
I called pt. I advised pt that Dr. Dohmeier reviewed their sleep study results and found that has moderate to severe sleep apnea and recommends that pt be treated with a cpap. Dr. Dohmeier recommends that pt return for a repeat sleep study in order to properly titrate the cpap and ensure a good mask fit. Pt is agreeable to returning for a titration study. I advised pt that our sleep lab will file with pt's insurance and call pt to schedule the sleep study when we hear back from the pt's insurance regarding coverage of this sleep study. Pt verbalized understanding of results. Pt had no questions at this time but was encouraged to call back if questions arise.   

## 2020-08-20 NOTE — Addendum Note (Signed)
Addended by: Melvyn Novas on: 08/20/2020 11:57 AM   Modules accepted: Orders

## 2020-08-22 ENCOUNTER — Ambulatory Visit: Payer: Medicaid Other | Admitting: Internal Medicine

## 2020-08-27 ENCOUNTER — Ambulatory Visit (INDEPENDENT_AMBULATORY_CARE_PROVIDER_SITE_OTHER): Payer: Medicaid Other | Admitting: Neurology

## 2020-08-27 ENCOUNTER — Other Ambulatory Visit: Payer: Self-pay

## 2020-08-27 DIAGNOSIS — G4733 Obstructive sleep apnea (adult) (pediatric): Secondary | ICD-10-CM | POA: Diagnosis not present

## 2020-08-27 DIAGNOSIS — G43109 Migraine with aura, not intractable, without status migrainosus: Secondary | ICD-10-CM

## 2020-08-27 DIAGNOSIS — G4719 Other hypersomnia: Secondary | ICD-10-CM

## 2020-08-27 DIAGNOSIS — R0683 Snoring: Secondary | ICD-10-CM

## 2020-08-27 DIAGNOSIS — F513 Sleepwalking [somnambulism]: Secondary | ICD-10-CM

## 2020-08-29 ENCOUNTER — Ambulatory Visit (INDEPENDENT_AMBULATORY_CARE_PROVIDER_SITE_OTHER): Payer: Medicaid Other | Admitting: Internal Medicine

## 2020-08-29 ENCOUNTER — Other Ambulatory Visit: Payer: Self-pay

## 2020-08-29 ENCOUNTER — Encounter: Payer: Self-pay | Admitting: Internal Medicine

## 2020-08-29 VITALS — BP 128/79 | HR 84 | Temp 96.8°F | Ht 63.0 in | Wt 226.8 lb

## 2020-08-29 DIAGNOSIS — K219 Gastro-esophageal reflux disease without esophagitis: Secondary | ICD-10-CM

## 2020-08-29 DIAGNOSIS — R1032 Left lower quadrant pain: Secondary | ICD-10-CM | POA: Diagnosis not present

## 2020-08-29 MED ORDER — PANTOPRAZOLE SODIUM 40 MG PO TBEC
40.0000 mg | DELAYED_RELEASE_TABLET | Freq: Every day | ORAL | 11 refills | Status: DC
Start: 2020-08-29 — End: 2020-10-20

## 2020-08-29 NOTE — Patient Instructions (Signed)
Begin Protonix 40 mg daily dispense 30 with 11 refills  Take every morning before breakfast without fail  Stop omeprazole  Stop Bentyl  Begin digestive advantage probiotic (for gas bloat) -1 capsule daily  Begin Linzess 72   -  1 capsule daily.  Samples provided  Pursue treatment for sleep apnea.  Office visit with Lewie Loron in 6 weeks

## 2020-08-29 NOTE — Progress Notes (Signed)
Primary Care Physician:  Benita Stabile, MD Primary Gastroenterologist:  Dr.   Pre-Procedure History & Physical: HPI:  Mary Parker is a 39 y.o. female with history of IBS, fibromyalgia, depression, anxiety -  recently diagnosed with sleep apnea, GERD and IBS here for recurrent/persistent left lower quadrant abdominal pain and bloating. Lots of abdomina bloating after she eats; states she looks 9 months pregnant from time to time.  Tendency towards constipation  - having 1 bowel movement every other day.  Sometimes wipes a little blood.  Known internal hemorrhoids -  2018 colonoscopy (otherwise negative).  GERD poorly controlled on omeprazole 20 mg every other day (patient fearful of side effects).  Was prescribed Linzess after colonoscopy 2018.  Never took it.  Has been on Bentyl nightly for her abdominal complaints.  Titrating for CPAP treatment in the near future.  Previous EGD normal elsewhere. Patient states she has lost 20 pounds this year because of the above-mentioned symptoms. Underwent salpingectomy and hysterectomy 2018-not much improvement in her chronic left lower quadrant abdominal pain.  Patient has been getting Botox injections for migraine headaches.  Past Medical History:  Diagnosis Date  . Anxiety   . Arthritis    both knees  . Body aches 12/11/2014  . Chronic insomnia   . Chronic low back pain 05/29/2015  . Common migraine 12/19/2014  . Depression 12/11/2014  . Fibrocystic disease of both breasts   . Fibromyalgia 12/19/2014  . GERD (gastroesophageal reflux disease)   . History of hiatal hernia   . IBS (irritable bowel syndrome)   . Knee pain, right   . Migraines   . Rebound headache 12/19/2014  . Right ovarian cyst 04/25/2017   Complex, check CA 125 and repeat US in 6 weeks   . Sciatica of left side   . Seizures (HCC)    remote past, felt it was related to Chantix   . Vitamin D deficiency   . Wears glasses     Past Surgical History:  Procedure Laterality Date   . BILATERAL SALPINGECTOMY Bilateral 06/21/2017   Procedure: BILATERAL SALPINGECTOMY;  Surgeon: Tilda Burrow, MD;  Location: [facility];  Service: Gynecology;  Laterality: Bilateral;  . CESAREAN SECTION  [phone]  . CHOLECYSTECTOMY  2001  . COLONOSCOPY WITH PROPOFOL N/A 04/07/2017   Dr. Lovena Neighbours: Normal, abnormal CT likely artifactual  . ENDOMETRIAL ABLATION    . ESOPHAGOGASTRODUODENOSCOPY  2002   [facility]: normal  . KNEE ARTHROSCOPY WITH LATERAL RELEASE Right 04/18/2018   Procedure: Right knee arthroscopy with chondroplasty and lateral release;  Surgeon: Yolonda Kida, MD;  Location: [facility];  Service: Orthopedics;  Laterality: Right;  60 mins  . SUPRACERVICAL ABDOMINAL HYSTERECTOMY N/A 06/21/2017   Procedure: HYSTERECTOMY SUPRACERVICAL ABDOMINAL;  Surgeon: Tilda Burrow, MD;  Location: [facility];  Service: Gynecology;  Laterality: N/A;  . TUBAL LIGATION      Prior to Admission medications   Medication Sig Start Date End Date Taking? Authorizing Provider  ALPRAZolam Prudy Feeler) 0.5 MG tablet Take 0.5 mg by mouth at bedtime as needed for sleep.  10/06/18  Yes [provider]  botulinum toxin Type A (BOTOX) 100 units SOLR injection Inject 155units IM into head and neck muscles every 3 months by the provider. 06/11/20  Yes Glean Salvo, NP  budesonide-formoterol [facility]) 160-4.5 MCG/ACT inhaler Inhale 2 puffs into the lungs 2 (two) times daily as needed (for flares).    Yes [provider]  cyanocobalamin (,VITAMIN B-12,)  1000 MCG/ML injection Inject 1,000 mcg into the skin every 30 (thirty) days.  02/17/18  Yes [provider]  dicyclomine (BENTYL) 10 MG capsule TAKE 1 CAPSULE BY MOUTH UP TO THREE TIMES DAILY AS NEEDED FOR SPASMS OR ABDOMINAL PAIN Patient taking differently: at bedtime. TAKE 1 CAPSULE BY MOUTH UP TO THREE TIMES DAILY AS NEEDED FOR SPASMS OR ABDOMINAL PAIN 05/28/20  Yes Gelene Mink, NP  escitalopram (LEXAPRO) 20 MG tablet  Take 20 mg by mouth at bedtime.  10/31/17  Yes [provider]  indomethacin (INDOCIN) 25 MG capsule TAKE 1 TO 2 CAPSULES BY MOUTH AS NEEDED FOR HEADACHE. NO MORE THAN 6 CAPSULES PER WEEK Patient taking differently: Take 25-50 mg by mouth daily as needed (headache). NO MORE THAN 6 CAPSULES PER WEEK 11/08/19  Yes York Spaniel, MD  omeprazole (PRILOSEC) 20 MG capsule Take 20 mg by mouth at bedtime.  03/17/19  Yes [provider]  ondansetron (ZOFRAN-ODT) 4 MG disintegrating tablet DISSOLVE 1 TABLET(4 MG) ON THE TONGUE EVERY 8 HOURS AS NEEDED FOR NAUSEA OR VOMITING Patient taking differently: Take 4 mg by mouth as needed for nausea or vomiting.  12/07/19  Yes York Spaniel, MD  tiZANidine (ZANAFLEX) 2 MG tablet Take 1 tablet (2 mg total) by mouth at bedtime. 05/04/19  Yes Layne Benton, NP  Vitamin D, Ergocalciferol, (DRISDOL) 50000 units CAPS capsule Take 50,000 Units by mouth every Sunday.  02/18/18  Yes [provider]  predniSONE (DELTASONE) 20 MG tablet Take 3 pills a day Patient not taking: Reported on 08/29/2020 08/01/20   Bethann Berkshire, MD    Allergies as of 08/29/2020 - Review Complete 08/29/2020  Allergen Reaction Noted  . Bupropion Other (See Comments) 05/07/2015  . Duloxetine hcl Hives, Itching, and Rash 04/04/2017  . Gabapentin Itching 03/03/2017  . Meloxicam Other (See Comments) 05/28/2016  . Pregabalin Other (See Comments) 04/04/2017  . Amitriptyline Other (See Comments) 10/23/2018  . Ciprofloxacin Hives, Swelling, and Other (See Comments) 12/11/2014  . Desvenlafaxine succinate er Other (See Comments) 11/09/2017  . Hydrocodone-acetaminophen Swelling 04/18/2018  . Milnacipran Nausea Only and Other (See Comments) 06/15/2017  . Nortriptyline Nausea And Vomiting and Other (See Comments) 04/01/2015  . Topiramate Swelling and Other (See Comments) 04/01/2015  . Decadron [dexamethasone] Other (See Comments) 03/05/2019  . Doxycycline Nausea Only 06/29/2017    . Percocet [oxycodone-acetaminophen] Nausea Only 06/29/2017    Family History  Problem Relation Age of Onset  . Hypertension Mother   . Other Mother        abnormal cells; had hyst  . Obesity Daughter   . Diabetes Daughter 29       youngest  . Obesity Son   . Cancer Maternal Grandmother   . Osteoporosis Maternal Grandmother   . Cancer Maternal Grandfather   . Obesity Daughter   . Diabetes Father   . Hypertension Father   . Alcohol abuse Father   . Colon cancer Neg Hx     Social History   Socioeconomic History  . Marital status: Divorced    Spouse name: Not on file  . Number of children: 3  . Years of education: HS  . Highest education level: Not on file  Occupational History  . Occupation: [employer]  Tobacco Use  . Smoking status: Former Smoker    Packs/day: 0.25    Years: 25.00    Pack years: 6.25    Types: Cigarettes    Quit date: 04/02/2019  Years since quitting: 1.4  . Smokeless tobacco: Never Used  Vaping Use  . Vaping Use: Never used  Substance and Sexual Activity  . Alcohol use: No  . Drug use: No  . Sexual activity: Not Currently    Birth control/protection: Surgical    Comment: supracervical hyst  Other Topics Concern  . Not on file  Social History Narrative   Patient is right handed.   Patient drinks 1-2 cups of caffeine dailyy.   Patient lives mom stepdad, and children.   Social Determinants of Health   Financial Resource Strain:   . Difficulty of Paying Living Expenses: Not on file  Food Insecurity:   . Worried About Programme researcher, broadcasting/film/video in the Last Year: Not on file  . Ran Out of Food in the Last Year: Not on file  Transportation Needs:   . Lack of Transportation (Medical): Not on file  . Lack of Transportation (Non-Medical): Not on file  Physical Activity:   . Days of Exercise per Week: Not on file  . Minutes of Exercise per Session: Not on file  Stress:   . Feeling of Stress : Not on file  Social Connections:   . Frequency  of Communication with Friends and Family: Not on file  . Frequency of Social Gatherings with Friends and Family: Not on file  . Attends Religious Services: Not on file  . Active Member of Clubs or Organizations: Not on file  . Attends Banker Meetings: Not on file  . Marital Status: Not on file  Intimate Partner Violence:   . Fear of Current or Ex-Partner: Not on file  . Emotionally Abused: Not on file  . Physically Abused: Not on file  . Sexually Abused: Not on file    Review of Systems: See HPI, otherwise negative ROS  Physical Exam: BP 128/79   Pulse 84   Temp (!) 96.8 F (36 C) (Temporal)   Ht 5\' 3"  (1.6 m)   Wt 226 lb 12.8 oz (102.9 kg)   BMI 40.18 kg/m  General:   Alert,  Well-developed, well-nourished, pleasant and cooperative in NAD Neck:  Supple; no masses or thyromegaly. No significant cervical adenopathy. Lungs:  Clear throughout to auscultation.   No wheezes, crackles, or rhonchi. No acute distress. Heart:  Regular rate and rhythm; no murmurs, clicks, rubs,  or gallops. Abdomen: Nondistended.  Positive bowel sounds soft some localized left lower quadrant abdominal tenderness to palpation.  No mass or rebound.  \ Pulses:  Normal pulses noted. Extremities:  Without clubbing or edema. Rectal: No external lesions.  Good sphincter tone.  No mass in rectal vault.  No stool in the rectal vault.  Mucus is Hemoccult negative   Impression/Plan: 39 year old lady with chronic left lower quadrant abdominal pain, bloating tendency towards constipation and GERD in the setting of anxiety neurosis, fibromyalgia and IBS  --  now with recently diagnosed sleep apnea.  As discussed with the patient, untreated sleep apnea could well be negatively impacting many of her GI and non-GI issues.  Recommendations:  Begin Protonix 40 mg daily dispense 30 with 11 refills  Take every morning before breakfast without fail  Stop omeprazole  Stop Bentyl  Begin digestive  advantage probiotic (for gas bloat) -1 capsule daily  Begin Linzess 72   -  1 capsule daily.  Samples provided  Pursue treatment for sleep apnea.  Office visit with 46 in 6 weeks   Notice: This dictation was prepared with Dragon dictation  along with smaller phrase technology. Any transcriptional errors that result from this process are unintentional and may not be corrected upon review.

## 2020-09-01 ENCOUNTER — Telehealth: Payer: Self-pay | Admitting: Neurology

## 2020-09-01 NOTE — Telephone Encounter (Signed)
Patient has a Botox appointment on 11/23. I called [facility] and spoke to Hilda Lias to schedule Botox delivery. Botox TBD 11/4.

## 2020-09-03 ENCOUNTER — Ambulatory Visit: Payer: Medicaid Other | Admitting: Physical Therapy

## 2020-09-08 DIAGNOSIS — G4733 Obstructive sleep apnea (adult) (pediatric): Secondary | ICD-10-CM | POA: Insufficient documentation

## 2020-09-08 NOTE — Procedures (Signed)
PATIENT'S NAME:  Mary Parker, Mary Parker DOB:      1981-03-04      MR#:    [id number]     DATE OF RECORDING: 08/27/2020 REFERRING M.D.:  Benita Stabile, MD Study Performed:   Titration to positive airway pressure.  HISTORY:  Patient is here following these results of his PSG from 10.6.2021:    1. Moderate-severe Obstructive Sleep Apnea (OSA), The total APNEA/HYPOPNEA INDEX (AHI) was 29.1/h consistent of mostly hypopneas- with strong REM sleep dependence. The REM AHI was    75.2 /hour.  2. Snoring  3. Some hypoxia was recorded, a possible cause of morning headaches or cluster headaches.  4. High sleep efficiency and short sleep latency.  No sleep walking/ talking/ eating was observed.     The patient endorsed the Epworth Sleepiness Scale at 16 points.   The patient's weight 229 pounds with a height of 63 (inches), resulting in a BMI of 40.6 kg/m2. The patient's neck circumference measured 15 inches.  CURRENT MEDICATIONS: Vitamin D, Zanaflex, Zofran-ODT, Prilosec, Indocin, Lexapro, Benadryl, Vit B12, Symbicort, Botox, Xanax, Proventil, Tylenol    PROCEDURE:  This is a multichannel digital polysomnogram utilizing the SomnoStar 11.2 system.  Electrodes and sensors were applied and monitored per AASM Specifications.   EEG, EOG, Chin and Limb EMG, were sampled at 200 Hz.  ECG, Snore and Nasal Pressure, Thermal Airflow, Respiratory Effort, CPAP Flow and Pressure, Oximetry was sampled at 50 Hz. Digital video and audio were recorded.      CPAP was initiated at 5 cmH20 with heated humidity per AASM split night standards and pressure was advanced to 13 cmH20 because of hypopneas, apneas and desaturations.  CPAP tolerance was limited at the higher pressures and the patient was changed to BiPAP: beginning at 14/10cm and explored to a final Bi- PAP pressure of 15/11 cmH20, where there was a reduction of the AHI to 0.0/h. with improvement of sleep apnea.  Lights Out was at 22:35 and Lights On at 04:34. Total recording  time (TRT) was 360 minutes, with a total sleep time (TST) of 327.5 minutes. The patient's sleep latency was 10 minutes. REM latency was 64.5 minutes.  The sleep efficiency was 91.0 %.    SLEEP ARCHITECTURE: WASO (Wake after sleep onset) was 27.5 minutes.  There were 18 minutes in Stage N1, 86.5 minutes Stage N2, 50.5 minutes Stage N3 and 172.5 minutes in Stage REM.  The percentage of Stage N1 was 5.5%, Stage N2 was 26.4%, Stage N3 was 15.4% and Stage R (REM sleep) was 52.7%. The sleep architecture was notable for an unusual high REM sleep proportion.     RESPIRATORY ANALYSIS:  There was a total of 36 respiratory events: 0 obstructive apneas, 0 central apneas and 0 mixed apneas with a total of 0 apneas and an apnea index (AI) of 0 /hour. There were 36 hypopneas with a hypopnea index of 6.6/hour.  The total APNEA/HYPOPNEA INDEX (AHI) was 6.6 /hour.  20 events occurred in REM sleep and 16 events in NREM. The REM AHI was 7.0 /hour versus a non-REM AHI of 6.2 /hour.  The patient spent 0 minutes of total sleep time in the supine position and 328 minutes in non-supine.  The supine AHI was 0.0, versus a non-supine AHI of 6.6/h.  OXYGEN SATURATION & C02:  The baseline 02 saturation was 94%, with the lowest being 77%. Time spent below 89% saturation equaled 6 minutes. The arousals were noted as: 37 were spontaneous, 0 were associated with PLMs,  23 were associated with respiratory events. The patient had a total of 0 Periodic Limb Movements.   Audio and video analysis did not show any abnormal or unusual movements, behaviors, phonations or vocalizations.  The patient took one bathroom break. Snoring was noted on CPAP, not on BiPAP.  EKG was in keeping with normal sinus rhythm (NSR).  DIAGNOSIS 1. Obstructive Sleep Apnea responded well to BiPAP after CPAP could not be tolerated at pressures necessary to control AHI and snoring. The final BIPAP setting was 15/11 cm water under a ResMed P10 nasal pillow in  extra small,   PLANS/RECOMMENDATIONS: Above named therapy choice of BiPAP under a P10 nasal pillow will be ordered.   DISCUSSION: A follow up appointment will be scheduled in the Sleep Clinic at [facility].   Please call [phone] with any questions.      I certify that I have reviewed the entire raw data recording prior to the issuance of this report in accordance with the Standards of Accreditation of the American Academy of Sleep Medicine (AASM)   Melvyn Novas, M.D. Diplomat, Biomedical engineer of Psychiatry and Neurology  Diplomat, Biomedical engineer of Sleep Medicine Wellsite geologist, [employer] [employer]

## 2020-09-08 NOTE — Addendum Note (Signed)
Addended by: Melvyn Novas on: 09/08/2020 05:26 PM   Modules accepted: Orders

## 2020-09-08 NOTE — Progress Notes (Signed)
Patient of Dr. Anne Hahn and Loni Dolly, for Headaches/ Migraines  DIAGNOSIS  1. Obstructive Sleep Apnea responded well to BiPAP after CPAP  could not be tolerated at pressures necessary to control AHI and  snoring.  The final BIPAP setting was 15/11 cm water under a ResMed P10 nasal pillow in extra small size.   PLANS/RECOMMENDATIONS: Above named therapy choice of BiPAP under a P10 nasal pillow will be ordered.

## 2020-09-09 ENCOUNTER — Telehealth: Payer: Self-pay | Admitting: Neurology

## 2020-09-09 ENCOUNTER — Encounter: Payer: Self-pay | Admitting: Neurology

## 2020-09-09 NOTE — Telephone Encounter (Signed)
Patient's Botox was not delivered on 11/4. I received a call from [facility] Karena Addison) to reschedule delivery for 11/11.

## 2020-09-09 NOTE — Telephone Encounter (Signed)
I called pt. I advised pt that Dr. Vickey Huger reviewed their sleep study results and found that pt best treated with a BiPAP. Dr. Vickey Huger recommends that pt starts BiPAP at 15/11 cm water pressure. I reviewed PAP compliance expectations with the pt. Pt is agreeable to starting a BiPAP. I advised pt that an order will be sent to a DME, [facility] and [facility] will call the pt within about one week after they file with the pt's insurance. [facility]) will show the pt how to use the machine, fit for masks, and troubleshoot the CPAP if needed. A follow up appt will need to be made for insurance purposes with Dr. Vickey Huger or NP. Pt verbalized understanding to call and schedule this appointment once they are scheduled to pick up there machine. A letter with all of this information in it will be sent to the pt as a reminder. Pt verbalized understanding of results. Pt had no questions at this time but was encouraged to call back if questions arise. I have sent the order to [facility]) and have received confirmation that they have received the order.

## 2020-09-09 NOTE — Telephone Encounter (Signed)
-----   Message from Melvyn Novas, MD sent at 09/08/2020  5:26 PM EST ----- Patient of Dr. Anne Hahn and Loni Dolly, for Headaches/ Migraines  DIAGNOSIS  1. Obstructive Sleep Apnea responded well to BiPAP after CPAP  could not be tolerated at pressures necessary to control AHI and  snoring.  The final BIPAP setting was 15/11 cm water under a ResMed P10 nasal pillow in extra small size.   PLANS/RECOMMENDATIONS: Above named therapy choice of BiPAP under a P10 nasal pillow will be ordered.

## 2020-09-10 ENCOUNTER — Ambulatory Visit: Payer: Medicaid Other | Admitting: Physical Therapy

## 2020-09-11 NOTE — Telephone Encounter (Signed)
(  2) 100U vials of Botox delivered today for patient's 11/23 appointment.

## 2020-09-16 ENCOUNTER — Telehealth: Payer: Self-pay

## 2020-09-16 NOTE — Telephone Encounter (Signed)
Pt sent a mychart message stating the Linzess 72 mcg samples worked well. Pt would like RX sent to her pharmacy. Please advise.

## 2020-09-17 ENCOUNTER — Other Ambulatory Visit: Payer: Self-pay

## 2020-09-17 MED ORDER — LINACLOTIDE 72 MCG PO CAPS: 72.0000 ug | ORAL_CAPSULE | Freq: Every day | ORAL | 3 refills | Status: DC

## 2020-09-17 NOTE — Telephone Encounter (Signed)
She can have 30 grams of [employer] hemorrhoid cream -  add lidocaine- apply to anorectum TID prn .  One refill.  Would probably benefit from hemorrhoid banding.  Send her a pamphlet on banding and offer appt with Lewie Loron.  Thanks.

## 2020-09-17 NOTE — Telephone Encounter (Signed)
Spoke with pt. Pt is aware that CA hemorrhoid cream w lido was called in. They won't have the cream ready until tomorrow and pt is ok with this. Hemorrhoid info mailed to pt.

## 2020-09-18 ENCOUNTER — Encounter: Payer: Medicaid Other | Admitting: Physical Therapy

## 2020-09-23 ENCOUNTER — Encounter: Payer: Self-pay | Admitting: Neurology

## 2020-09-23 ENCOUNTER — Ambulatory Visit: Payer: Medicaid Other | Admitting: Neurology

## 2020-09-23 DIAGNOSIS — G43019 Migraine without aura, intractable, without status migrainosus: Secondary | ICD-10-CM

## 2020-09-23 NOTE — Progress Notes (Signed)
Botox- 100 units x 2 vials Lot: [id number] Expiration: 12/2022 NDC: 3013-1438-88  Bacteriostatic 0.9% Sodium Chloride- 1mL total Lot: [id number] Expiration: 12/2021 NDC: 2060-1561-53  Dx: G43.019 S/P  Consent was given and signed today.

## 2020-09-23 NOTE — Procedures (Signed)
     BOTOX PROCEDURE NOTE FOR MIGRAINE HEADACHE   HISTORY: Mary Parker is a 39 year old female with history of chronic migraine headaches, presents today for subsequent Botox injection.  Botox has been 90% helpful for her migraines, in the last 3 months, has only had 3 significant migraines.  Will be starting BiPAP soon, encouraged this could further help headaches.  She has responded well to Botox, and tolerated it well.    Description of procedure:  The patient was placed in a sitting position. The standard protocol was used for Botox as follows, with 5 units of Botox injected at each site:   -Procerus muscle, midline injection  -Corrugator muscle, bilateral injection  -Frontalis muscle, bilateral injection, with 2 sites each side, medial injection was performed in the upper one third of the frontalis muscle, in the region vertical from the medial inferior edge of the superior orbital rim. The lateral injection was again in the upper one third of the forehead vertically above the lateral limbus of the cornea, 1.5 cm lateral to the medial injection site.  -Temporalis muscle injection, 4 sites, bilaterally. The first injection was 3 cm above the tragus of the ear, second injection site was 1.5 cm to 3 cm up from the first injection site in line with the tragus of the ear. The third injection site was 1.5-3 cm forward between the first 2 injection sites. The fourth injection site was 1.5 cm posterior to the second injection site.  -Occipitalis muscle injection, 3 sites, bilaterally. The first injection was done one half way between the occipital protuberance and the tip of the mastoid process behind the ear. The second injection site was done lateral and superior to the first, 1 fingerbreadth from the first injection. The third injection site was 1 fingerbreadth superiorly and medially from the first injection site.  -Cervical paraspinal muscle injection, 2 sites, bilateral, the first  injection site was 1 cm from the midline of the cervical spine, 3 cm inferior to the lower border of the occipital protuberance. The second injection site was 1.5 cm superiorly and laterally to the first injection site.  -Trapezius muscle injection was performed at 3 sites, bilaterally. The first injection site was in the upper trapezius muscle halfway between the inflection point of the neck, and the acromion. The second injection site was one half way between the acromion and the first injection site. The third injection was done between the first injection site and the inflection point of the neck.   A 200 unit bottle of Botox was used, 155 units were injected, the rest of the Botox was wasted. The patient tolerated the procedure well, there were no complications of the above procedure.  Botox NDC 0947-0962-83 Lot number [id number] Expiration date 12/2022 S/P

## 2020-10-09 ENCOUNTER — Encounter: Payer: Self-pay | Admitting: Neurology

## 2020-10-14 ENCOUNTER — Encounter: Payer: Self-pay | Admitting: Neurology

## 2020-10-15 ENCOUNTER — Telehealth: Payer: Self-pay | Admitting: Internal Medicine

## 2020-10-15 ENCOUNTER — Telehealth: Payer: Self-pay

## 2020-10-15 ENCOUNTER — Other Ambulatory Visit: Payer: Self-pay

## 2020-10-15 ENCOUNTER — Ambulatory Visit (INDEPENDENT_AMBULATORY_CARE_PROVIDER_SITE_OTHER): Payer: Medicaid Other | Admitting: Gastroenterology

## 2020-10-15 ENCOUNTER — Encounter: Payer: Self-pay | Admitting: Gastroenterology

## 2020-10-15 DIAGNOSIS — R1011 Right upper quadrant pain: Secondary | ICD-10-CM

## 2020-10-15 NOTE — Progress Notes (Signed)
Referring Provider: Benita Stabile, MD Primary Care Physician:  Benita Stabile, MD Primary GI: Dr. Jena Gauss   Chief Complaint  Patient presents with  . Abdominal Pain    R side abd pain radiating to back, n/v, unable to eat    HPI:   Mary Parker is a 39 y.o. female presenting today with a history of chronic LLQ abdominal pain, constipation, GERD, here for follow-up.   Acute onset of RUQ pain wrapping around to back on Monday. Constant. In the bed last 3 days. Only thing that makes it better is laying very still. Eating worsens. Associated N/V. No hematemesis. No melena. Stool looks oily when having BM, which is diarrhea. Not consistent BMs. Was taking Linzess 72 mcg until a few days ago. Monday was unable to eat anything. Yesterday had chicken noodle soup. Had worsened pain with bread. Able to take meds. Has to mentally make herself swallow to get it down.   Things were getting back to normal GI-wise over past few months since last seen in Oct 2021.   Past Medical History:  Diagnosis Date  . Anxiety   . Arthritis    both knees  . Body aches 12/11/2014  . Chronic insomnia   . Chronic low back pain 05/29/2015  . Common migraine 12/19/2014  . Depression 12/11/2014  . Fibrocystic disease of both breasts   . Fibromyalgia 12/19/2014  . GERD (gastroesophageal reflux disease)   . History of hiatal hernia   . IBS (irritable bowel syndrome)   . Knee pain, right   . Migraines   . Rebound headache 12/19/2014  . Right ovarian cyst 04/25/2017   Complex, check CA 125 and repeat US in 6 weeks   . Sciatica of left side   . Seizures (HCC)    remote past, felt it was related to Chantix   . Vitamin D deficiency   . Wears glasses     Past Surgical History:  Procedure Laterality Date  . BILATERAL SALPINGECTOMY Bilateral 06/21/2017   Procedure: BILATERAL SALPINGECTOMY;  Surgeon: Tilda Burrow, MD;  Location: [facility];  Service: Gynecology;  Laterality: Bilateral;  . CESAREAN SECTION   [phone]  . CHOLECYSTECTOMY  2001  . COLONOSCOPY WITH PROPOFOL N/A 04/07/2017   Dr. Lovena Neighbours: Normal, abnormal CT likely artifactual  . ENDOMETRIAL ABLATION    . ESOPHAGOGASTRODUODENOSCOPY  2002   [facility]: normal  . KNEE ARTHROSCOPY WITH LATERAL RELEASE Right 04/18/2018   Procedure: Right knee arthroscopy with chondroplasty and lateral release;  Surgeon: Yolonda Kida, MD;  Location: [facility];  Service: Orthopedics;  Laterality: Right;  60 mins  . SUPRACERVICAL ABDOMINAL HYSTERECTOMY N/A 06/21/2017   Procedure: HYSTERECTOMY SUPRACERVICAL ABDOMINAL;  Surgeon: Tilda Burrow, MD;  Location: [facility];  Service: Gynecology;  Laterality: N/A;  . TUBAL LIGATION      Current Outpatient Medications  Medication Sig Dispense Refill  . ALPRAZolam (XANAX) 0.5 MG tablet Take 0.5 mg by mouth at bedtime as needed for sleep.     . botulinum toxin Type A (BOTOX) 100 units SOLR injection Inject 155units IM into head and neck muscles every 3 months by the provider. 2 each 3  . budesonide-formoterol (SYMBICORT) 160-4.5 MCG/ACT inhaler Inhale 2 puffs into the lungs 2 (two) times daily as needed (for flares).     . cyanocobalamin (,VITAMIN B-12,) 1000 MCG/ML injection Inject 1,000 mcg into the skin every 30 (thirty) days.   0  . escitalopram (LEXAPRO) 20 MG tablet  Take 20 mg by mouth at bedtime.   0  . indomethacin (INDOCIN) 25 MG capsule TAKE 1 TO 2 CAPSULES BY MOUTH AS NEEDED FOR HEADACHE. NO MORE THAN 6 CAPSULES PER WEEK (Patient taking differently: Take 25-50 mg by mouth daily as needed (headache). NO MORE THAN 6 CAPSULES PER WEEK) 30 capsule 1  . ondansetron (ZOFRAN-ODT) 4 MG disintegrating tablet DISSOLVE 1 TABLET(4 MG) ON THE TONGUE EVERY 8 HOURS AS NEEDED FOR NAUSEA OR VOMITING (Patient taking differently: Take 4 mg by mouth as needed for nausea or vomiting.) 30 tablet 6  . pantoprazole (PROTONIX) 40 MG tablet Take 1 tablet (40 mg total) by mouth daily. 40 tablet 11  .  tiZANidine (ZANAFLEX) 2 MG tablet Take 1 tablet (2 mg total) by mouth at bedtime.    . Vitamin D, Ergocalciferol, (DRISDOL) 50000 units CAPS capsule Take 50,000 Units by mouth every Sunday.   0  . linaclotide (LINZESS) 72 MCG capsule Take 1 capsule (72 mcg total) by mouth daily before breakfast. (Patient not taking: Reported on 10/15/2020) 30 capsule 3   No current facility-administered medications for this visit.    Allergies as of 10/15/2020 - Review Complete 10/15/2020  Allergen Reaction Noted  . Bupropion Other (See Comments) 05/07/2015  . Duloxetine hcl Hives, Itching, and Rash 04/04/2017  . Gabapentin Itching 03/03/2017  . Meloxicam Other (See Comments) 05/28/2016  . Pregabalin Other (See Comments) 04/04/2017  . Amitriptyline Other (See Comments) 10/23/2018  . Ciprofloxacin Hives, Swelling, and Other (See Comments) 12/11/2014  . Desvenlafaxine succinate er Other (See Comments) 11/09/2017  . Hydrocodone-acetaminophen Swelling 04/18/2018  . Milnacipran Nausea Only and Other (See Comments) 06/15/2017  . Nortriptyline Nausea And Vomiting and Other (See Comments) 04/01/2015  . Topiramate Swelling and Other (See Comments) 04/01/2015  . Decadron [dexamethasone] Other (See Comments) 03/05/2019  . Doxycycline Nausea Only 06/29/2017  . Percocet [oxycodone-acetaminophen] Nausea Only 06/29/2017    Family History  Problem Relation Age of Onset  . Hypertension Mother   . Other Mother        abnormal cells; had hyst  . Obesity Daughter   . Diabetes Daughter 3       youngest  . Obesity Son   . Cancer Maternal Grandmother   . Osteoporosis Maternal Grandmother   . Cancer Maternal Grandfather   . Obesity Daughter   . Diabetes Father   . Hypertension Father   . Alcohol abuse Father   . Colon cancer Neg Hx     Social History   Socioeconomic History  . Marital status: Divorced    Spouse name: Not on file  . Number of children: 3  . Years of education: HS  . Highest education  level: Not on file  Occupational History  . Occupation: [employer]  Tobacco Use  . Smoking status: Former Smoker    Packs/day: 0.25    Years: 25.00    Pack years: 6.25    Types: Cigarettes    Quit date: 04/02/2019    Years since quitting: 1.5  . Smokeless tobacco: Never Used  Vaping Use  . Vaping Use: Never used  Substance and Sexual Activity  . Alcohol use: No  . Drug use: No  . Sexual activity: Not Currently    Birth control/protection: Surgical    Comment: supracervical hyst  Other Topics Concern  . Not on file  Social History Narrative   Patient is right handed.   Patient drinks 1-2 cups of caffeine dailyy.   Patient lives mom  stepdad, and children.   Social Determinants of Health   Financial Resource Strain: Not on file  Food Insecurity: Not on file  Transportation Needs: Not on file  Physical Activity: Not on file  Stress: Not on file  Social Connections: Not on file    Review of Systems: Gen: see hPI CV: Denies chest pain, palpitations, syncope, peripheral edema, and claudication. Resp: Denies dyspnea at rest, cough, wheezing, coughing up blood, and pleurisy. GI: see HPI Derm: Denies rash, itching, dry skin Psych: Denies depression, anxiety, memory loss, confusion. No homicidal or suicidal ideation.  Heme: Denies bruising, bleeding, and enlarged lymph nodes.  Physical Exam: BP 113/75   Pulse 84   Temp (!) 96.8 F (36 C) (Temporal)   Ht 5\' 4"  (1.626 m)   Wt 224 lb (101.6 kg)   BMI 38.45 kg/m  General:   Alert and oriented. In obvious discomfort Head:  Normocephalic and atraumatic. Eyes:  Conjuctiva clear without scleral icterus. Mouth:  Mask in place Abdomen:  +BS, soft, TTP epigastric, RUQ, right lateral abdomen and non-distended. No rebound or guarding. No HSM or masses noted. Msk:  Symmetrical without gross deformities. Normal posture. Extremities:  Without edema. Neurologic:  Alert and  oriented x4 Psych:  Alert and cooperative. Normal mood  and affect.  ASSESSMENT: Mary Parker is a 39 y.o. female presenting today with long-standing history of predominantly LLQ abdominal pain, constipation, GERD, now presenting with acute onset of RUQ several days ago that radiates to back. Gallbladder absent. Associated N/V, decreased appetite, and inability to tolerate foods noted. She has been staying hydrated with liquids. Afebrile.  Although gallbladder absent, unable to exclude CBD stone, possible pancreatitis, less likely ulcerative process or colitis/diverticulitis. Appendix remains but pain not typical for this.    PLAN:  Stat labs today Stat CT abd/pelvis Further recommendations to follow   44, PhD, ANP-BC [facility]

## 2020-10-15 NOTE — Telephone Encounter (Signed)
PA is pending.

## 2020-10-15 NOTE — Progress Notes (Signed)
abd

## 2020-10-15 NOTE — Telephone Encounter (Signed)
Patient called asking when her ct scan would be scheduled

## 2020-10-15 NOTE — Patient Instructions (Signed)
We are arranging a CT scan today to be done stat.  Please have blood work done at the hospital. We will try to manage this as outpatient; however, we will determine next steps after review of labs and imaging!  I enjoyed seeing you again today! As you know, I value our relationship and want to provide genuine, compassionate, and quality care. I welcome your feedback. If you receive a survey regarding your visit,  I greatly appreciate you taking time to fill this out. See you next time!  Gelene Mink, PhD, ANP-BC [facility]  t

## 2020-10-15 NOTE — Progress Notes (Signed)
Cc'ed to pcp °

## 2020-10-15 NOTE — Telephone Encounter (Signed)
Informed pt at OV CT abd/pelvis would need PA before scheduling. Will call her when it's been approved and scheduled.   PA for CT abd/pelvis w/contrast submitted via [facility] website. Case went to clinical review. Will be notified by fax within 2 business days if additional clinical info is needed. May call [phone] if need to speak to representative. Clinical notes uploaded.

## 2020-10-16 NOTE — Telephone Encounter (Signed)
PA pending

## 2020-10-17 LAB — COMPLETE METABOLIC PANEL WITH GFR
AG Ratio: 1.5 (calc) (ref 1.0–2.5)
ALT: 25 U/L (ref 6–29)
AST: 17 U/L (ref 10–30)
Albumin: 4.1 g/dL (ref 3.6–5.1)
Alkaline phosphatase (APISO): 67 U/L (ref 31–125)
BUN/Creatinine Ratio: 7 (calc) (ref 6–22)
BUN: 6 mg/dL — ABNORMAL LOW (ref 7–25)
CO2: 29 mmol/L (ref 20–32)
Calcium: 9.8 mg/dL (ref 8.6–10.2)
Chloride: 103 mmol/L (ref 98–110)
Creat: 0.83 mg/dL (ref 0.50–1.10)
GFR, Est African American: 102 mL/min/{1.73_m2} (ref >=60)
GFR, Est Non African American: 88 mL/min/{1.73_m2} (ref >=60)
Globulin: 2.7 g/dL (calc) (ref 1.9–3.7)
Glucose, Bld: 87 mg/dL (ref 65–99)
Potassium: 4 mmol/L (ref 3.5–5.3)
Sodium: 140 mmol/L (ref 135–146)
Total Bilirubin: 0.8 mg/dL (ref 0.2–1.2)
Total Protein: 6.8 g/dL (ref 6.1–8.1)

## 2020-10-17 LAB — CBC WITH DIFFERENTIAL/PLATELET
Absolute Monocytes: 432 cells/uL (ref 200–950)
Basophils Absolute: 33 cells/uL (ref 0–200)
Basophils Relative: 0.4 %
Eosinophils Absolute: 174 cells/uL (ref 15–500)
Eosinophils Relative: 2.1 %
HCT: 39 % (ref 35.0–45.0)
Hemoglobin: 12.7 g/dL (ref 11.7–15.5)
Lymphs Abs: 1062 cells/uL (ref 850–3900)
MCH: 29.8 pg (ref 27.0–33.0)
MCHC: 32.6 g/dL (ref 32.0–36.0)
MCV: 91.5 fL (ref 80.0–100.0)
MPV: 9.9 fL (ref 7.5–12.5)
Monocytes Relative: 5.2 %
Neutro Abs: 6599 cells/uL (ref 1500–7800)
Neutrophils Relative %: 79.5 %
Platelets: 228 10*3/uL (ref 140–400)
RBC: 4.26 10*6/uL (ref 3.80–5.10)
RDW: 13 % (ref 11.0–15.0)
Total Lymphocyte: 12.8 %
WBC: 8.3 10*3/uL (ref 3.8–10.8)

## 2020-10-17 LAB — LIPASE: Lipase: 13 U/L (ref 7–60)

## 2020-10-17 NOTE — Telephone Encounter (Signed)
PA pending this morning per [facility] website. Pt called office yesterday afternoon and Misty Stanley informed her PA for CT was pending.  I called [employer], spoke to clinical nurse reviewer Melanee Spry. Case was reviewed by nurse and unable to approve on nurse level d/t no lab results or prior imaging. Case has been sent to physician review.  Per chart, pt hasn't done stat labs ordered by Tobi Bastos. Called pt, informed her PA for CT is pending. Advised her to have lab work done today and if she gets worse over the weekend go to ED. Tobi Bastos will return to office 10/20/20. Stated she sent Tobi Bastos a MyChart message re: fever. Will forward message to Wynne Dust NP covering for today. Advised her again to do labs today and go to ED if she gets worse over the weekend.

## 2020-10-20 ENCOUNTER — Telehealth: Payer: Self-pay | Admitting: Internal Medicine

## 2020-10-20 ENCOUNTER — Telehealth: Payer: Self-pay | Admitting: Gastroenterology

## 2020-10-20 ENCOUNTER — Other Ambulatory Visit: Payer: Self-pay

## 2020-10-20 ENCOUNTER — Emergency Department (HOSPITAL_COMMUNITY)
Admission: EM | Admit: 2020-10-20 | Discharge: 2020-10-20 | Disposition: A | Discharge status: Home / Self Care | Payer: Medicaid Other | Attending: Emergency Medicine | Admitting: Emergency Medicine

## 2020-10-20 ENCOUNTER — Emergency Department (HOSPITAL_COMMUNITY): Payer: Medicaid Other

## 2020-10-20 ENCOUNTER — Encounter (HOSPITAL_COMMUNITY): Payer: Self-pay

## 2020-10-20 DIAGNOSIS — R1084 Generalized abdominal pain: Secondary | ICD-10-CM | POA: Diagnosis present

## 2020-10-20 DIAGNOSIS — Z87891 Personal history of nicotine dependence: Secondary | ICD-10-CM | POA: Insufficient documentation

## 2020-10-20 DIAGNOSIS — R509 Fever, unspecified: Secondary | ICD-10-CM | POA: Diagnosis not present

## 2020-10-20 DIAGNOSIS — R1110 Vomiting, unspecified: Secondary | ICD-10-CM | POA: Insufficient documentation

## 2020-10-20 DIAGNOSIS — R531 Weakness: Secondary | ICD-10-CM | POA: Insufficient documentation

## 2020-10-20 DIAGNOSIS — K219 Gastro-esophageal reflux disease without esophagitis: Secondary | ICD-10-CM | POA: Diagnosis not present

## 2020-10-20 DIAGNOSIS — Z20822 Contact with and (suspected) exposure to covid-19: Secondary | ICD-10-CM | POA: Diagnosis not present

## 2020-10-20 HISTORY — DX: Sleep apnea, unspecified: G47.30

## 2020-10-20 LAB — COMPREHENSIVE METABOLIC PANEL
ALT: 20 U/L (ref 0–44)
AST: 19 U/L (ref 15–41)
Albumin: 3.7 g/dL (ref 3.5–5.0)
Alkaline Phosphatase: 52 U/L (ref 38–126)
Anion gap: 7 (ref 5–15)
BUN: 6 mg/dL (ref 6–20)
CO2: 28 mmol/L (ref 22–32)
Calcium: 9 mg/dL (ref 8.9–10.3)
Chloride: 101 mmol/L (ref 98–111)
Creatinine, Ser: 0.82 mg/dL (ref 0.44–1.00)
GFR, Estimated: >60 mL/min (ref >60)
Glucose, Bld: 99 mg/dL (ref 70–99)
Potassium: 3.4 mmol/L — ABNORMAL LOW (ref 3.5–5.1)
Sodium: 136 mmol/L (ref 135–145)
Total Bilirubin: 0.7 mg/dL (ref 0.3–1.2)
Total Protein: 6.8 g/dL (ref 6.5–8.1)

## 2020-10-20 LAB — RESP PANEL BY RT-PCR (FLU A&B, COVID) ARPGX2
Influenza A by PCR: NEGATIVE
Influenza B by PCR: NEGATIVE
SARS Coronavirus 2 by RT PCR: NEGATIVE

## 2020-10-20 LAB — CBC
HCT: 36.2 % (ref 36.0–46.0)
Hemoglobin: 11.6 g/dL — ABNORMAL LOW (ref 12.0–15.0)
MCH: 30.1 pg (ref 26.0–34.0)
MCHC: 32 g/dL (ref 30.0–36.0)
MCV: 94 fL (ref 80.0–100.0)
Platelets: 187 10*3/uL (ref 150–400)
RBC: 3.85 MIL/uL — ABNORMAL LOW (ref 3.87–5.11)
RDW: 13.2 % (ref 11.5–15.5)
WBC: 5.8 10*3/uL (ref 4.0–10.5)
nRBC: 0 % (ref 0.0–0.2)

## 2020-10-20 LAB — LIPASE, BLOOD: Lipase: 20 U/L (ref 11–51)

## 2020-10-20 MED ORDER — DICYCLOMINE HCL 20 MG PO TABS: 20.0000 mg | ORAL_TABLET | Freq: Three times a day (TID) | ORAL | 0 refills | Status: DC | PRN

## 2020-10-20 MED ORDER — MORPHINE SULFATE (PF) 4 MG/ML IV SOLN
4.0000 mg | Freq: Once | INTRAVENOUS | Status: AC
  Administered 2020-10-20: 4 mg via INTRAVENOUS
  Filled 2020-10-20: qty 1

## 2020-10-20 MED ORDER — ONDANSETRON HCL 4 MG/2ML IJ SOLN
4.0000 mg | Freq: Once | INTRAMUSCULAR | Status: AC
  Administered 2020-10-20: 4 mg via INTRAVENOUS
  Filled 2020-10-20: qty 2

## 2020-10-20 MED ORDER — PANTOPRAZOLE SODIUM 40 MG PO TBEC: 40.0000 mg | DELAYED_RELEASE_TABLET | Freq: Two times a day (BID) | ORAL | 3 refills | Status: DC

## 2020-10-20 MED ORDER — SODIUM CHLORIDE 0.9 % IV BOLUS
1000.0000 mL | Freq: Once | INTRAVENOUS | Status: AC
  Administered 2020-10-20: 1000 mL via INTRAVENOUS

## 2020-10-20 MED ORDER — IOHEXOL 300 MG/ML  SOLN
100.0000 mL | Freq: Once | INTRAMUSCULAR | Status: AC | PRN
  Administered 2020-10-20: 100 mL via INTRAVENOUS

## 2020-10-20 NOTE — Telephone Encounter (Signed)
Noted. Pt was notified that lab work was normal when called this morning.

## 2020-10-20 NOTE — ED Notes (Signed)
Pt ambulatory to waiting room. Pt verbalized understanding of discharge instructions.   

## 2020-10-20 NOTE — Telephone Encounter (Signed)
Noted. Labs done while I was off. CBC, CMP, lipase all unrevealing. Agree with ED evaluation.

## 2020-10-20 NOTE — ED Provider Notes (Signed)
Emergency Department Provider Note   I have reviewed the triage vital signs and the nursing notes.   HISTORY  Chief Complaint Abdominal Pain   HPI Mary Parker is a 39 y.o. female with past medical history reviewed below including chronic abdominal pain presents to the emergency department for evaluation of new and worsening abdominal discomfort.  [facility] and was seen there on 12/15.  Her pain at that time was different from her chronic abdominal discomfort and she was referred for an outpatient CT scan.  With worsening pain over the last several days she was advised to present to the emergency department for evaluation.  She is having mainly right-sided abdominal pain over the past week.  She notes some subjective fever with weakness and vomiting along with diarrhea.  Her appetite has been poor and she feels like her abdomen is more distended than normal despite little appetite. No blood/black in the emesis or stool. No URI symptoms. No radiation of symptoms or modifying factors.   Past Medical History:  Diagnosis Date  . Anxiety   . Arthritis    both knees  . Body aches 12/11/2014  . Chronic insomnia   . Chronic low back pain 05/29/2015  . Common migraine 12/19/2014  . Depression 12/11/2014  . Fibrocystic disease of both breasts   . Fibromyalgia 12/19/2014  . GERD (gastroesophageal reflux disease)   . History of hiatal hernia   . IBS (irritable bowel syndrome)   . Knee pain, right   . Migraines   . Rebound headache 12/19/2014  . Right ovarian cyst 04/25/2017   Complex, check CA 125 and repeat US in 6 weeks   . Sciatica of left side   . Seizures (HCC)    remote past, felt it was related to Chantix   . Sleep apnea   . Vitamin D deficiency   . Wears glasses     Patient Active Problem List   Diagnosis Date Noted  . RUQ abdominal pain 10/15/2020  . OSA (obstructive sleep apnea) 09/08/2020  . Sleep walking and eating 07/30/2020  .  Excessive daytime sleepiness 07/30/2020  . Class 3 severe obesity due to excess calories without serious comorbidity with body mass index (BMI) of 40.0 to 44.9 in adult (HCC) 07/30/2020  . Psychophysiological insomnia 07/30/2020  . Loud snoring 07/30/2020  . Chronic migraine without aura, with intractable migraine, so stated, with status migrainosus 07/12/2019  . Cigarette smoker 05/04/2019  . Obesity, Class II, BMI 35-39.9 05/04/2019  . Stroke-like episode (HCC) s/p tPA 05/03/2019  . Acute gastroenteritis 03/29/2019  . 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 S/P abdominal supracervical subtotal hysterectomy 06/21/2017  . Pelvic adhesions 06/09/2017  . History of ovarian cyst 06/09/2017  . Right ovarian cyst 04/25/2017  . Encounter for gynecological examination with Papanicolaou smear of cervix 04/19/2017  . Rectal bleeding 04/02/2017  . LLQ pain 04/01/2017  . Constipation 04/01/2017  . Concussion with loss of consciousness 11/16/2016  . Anxiety 10/14/2016  . Chronic low back pain 05/29/2015  . Complicated migraine 12/19/2014  . Fibromyalgia 12/19/2014  . Rebound headache 12/19/2014  . Depression 12/11/2014  . Body aches 12/11/2014    Past Surgical History:  Procedure Laterality Date  . BILATERAL SALPINGECTOMY Bilateral 06/21/2017   Procedure: BILATERAL SALPINGECTOMY;  Surgeon: Tilda BurrowFerguson, John V, MD;  Location: [facility];  Service: Gynecology;  Laterality: Bilateral;  . CESAREAN SECTION  [phone],2000,2004  . CHOLECYSTECTOMY  2001  . COLONOSCOPY WITH PROPOFOL  N/A 04/07/2017   Dr. Lovena Neighbours: Normal, abnormal CT likely artifactual  . ENDOMETRIAL ABLATION    . ESOPHAGOGASTRODUODENOSCOPY  2002   [facility]: normal  . KNEE ARTHROSCOPY WITH LATERAL RELEASE Right 04/18/2018   Procedure: Right knee arthroscopy with chondroplasty and lateral release;  Surgeon: Yolonda Kida, MD;  Location: [facility];  Service: Orthopedics;  Laterality: Right;  60 mins  . SUPRACERVICAL  ABDOMINAL HYSTERECTOMY N/A 06/21/2017   Procedure: HYSTERECTOMY SUPRACERVICAL ABDOMINAL;  Surgeon: Tilda Burrow, MD;  Location: [facility];  Service: Gynecology;  Laterality: N/A;  . TUBAL LIGATION      Allergies Bupropion, Duloxetine hcl, Gabapentin, Meloxicam, Pregabalin, Amitriptyline, Ciprofloxacin, Desvenlafaxine succinate er, Hydrocodone-acetaminophen, Milnacipran, Nortriptyline, Topiramate, Decadron [dexamethasone], Doxycycline, and Percocet [oxycodone-acetaminophen]  Family History  Problem Relation Age of Onset  . Hypertension Mother   . Other Mother        abnormal cells; had hyst  . Obesity Daughter   . Diabetes Daughter 28       youngest  . Obesity Son   . Cancer Maternal Grandmother   . Osteoporosis Maternal Grandmother   . Cancer Maternal Grandfather   . Obesity Daughter   . Diabetes Father   . Hypertension Father   . Alcohol abuse Father   . Colon cancer Neg Hx     Social History Social History   Tobacco Use  . Smoking status: Former Smoker    Packs/day: 0.25    Years: 25.00    Pack years: 6.25    Types: Cigarettes    Quit date: 04/02/2019    Years since quitting: 1.5  . Smokeless tobacco: Never Used  Vaping Use  . Vaping Use: Never used  Substance Use Topics  . Alcohol use: No  . Drug use: No    Review of Systems  Constitutional: Subjective fever/chills and fatigue.  Eyes: No visual changes. ENT: No sore throat. Cardiovascular: Denies chest pain. Respiratory: Denies shortness of breath. Gastrointestinal: Positive right abdominal pain. Positive nausea, vomiting, and diarrhea.  No constipation. Genitourinary: Negative for dysuria. Musculoskeletal: Negative for back pain. Skin: Negative for rash. Neurological: Negative for headaches, focal weakness or numbness.  10-point ROS otherwise negative.  ____________________________________________   PHYSICAL EXAM:  VITAL SIGNS: ED Triage Vitals  Enc Vitals Group     BP 10/20/20 0955 100/70      Pulse Rate 10/20/20 0955 69     Resp 10/20/20 0955 18     Temp 10/20/20 0955 98.2 F (36.8 C)     Temp Source 10/20/20 0955 Oral     SpO2 10/20/20 0955 100 %     Weight 10/20/20 0954 224 lb (101.6 kg)     Height 10/20/20 0954 5\' 4"  (1.626 m)   Constitutional: Alert and oriented. Well appearing and in no acute distress. Eyes: Conjunctivae are normal.  Head: Atraumatic. Nose: No congestion/rhinnorhea. Mouth/Throat: Mucous membranes are moist.  Oropharynx non-erythematous. Neck: No stridor.  Cardiovascular: Normal rate, regular rhythm. Good peripheral circulation. Grossly normal heart sounds.   Respiratory: Normal respiratory effort.  No retractions. Lungs CTAB. Gastrointestinal: Soft with more diffuse right sided tenderness. Negative Murphy's sign. Mild distention.  Musculoskeletal: No gross deformities of extremities. Neurologic:  Normal speech and language.  Skin:  Skin is warm, dry and intact. No rash noted.  ____________________________________________   LABS (all labs ordered are listed, but only abnormal results are displayed)  Labs Reviewed  COMPREHENSIVE METABOLIC PANEL - Abnormal; Notable for the following components:      Result  Value   Potassium 3.4 (*)    All other components within normal limits  CBC - Abnormal; Notable for the following components:   RBC 3.85 (*)    Hemoglobin 11.6 (*)    All other components within normal limits  RESP PANEL BY RT-PCR (FLU A&B, COVID) ARPGX2  LIPASE, BLOOD   ____________________________________________  RADIOLOGY  CT ABDOMEN PELVIS W CONTRAST  Result Date: 10/20/2020 CLINICAL DATA:  Right lower quadrant pain for 1 week EXAM: CT ABDOMEN AND PELVIS WITH CONTRAST TECHNIQUE: Multidetector CT imaging of the abdomen and pelvis was performed using the standard protocol following bolus administration of intravenous contrast. CONTRAST:  OMNIPAQUE IOHEXOL 300 MG/ML  SOLN COMPARISON:  04/04/2017 FINDINGS: Lower chest: Lung bases  are free of acute infiltrate or sizable effusion. Hepatobiliary: Fatty infiltration of the liver is noted. The gallbladder has been surgically removed. Pancreas: Unremarkable. No pancreatic ductal dilatation or surrounding inflammatory changes. Spleen: Normal in size without focal abnormality. Adrenals/Urinary Tract: Adrenal glands are within normal limits. Kidneys demonstrate a normal enhancement pattern bilaterally. Delayed images demonstrate normal excretion of contrast material. No obstructive changes are seen. The bladder is well distended. Stomach/Bowel: No obstructive or inflammatory changes of the colon are noted. The appendix is well visualized and within normal limits. Stomach is decompressed. A single fluid-filled loop of small bowel is noted in the left abdomen although no true obstructive changes are seen. Vascular/Lymphatic: No significant vascular findings are present. No enlarged abdominal or pelvic lymph nodes. Reproductive: Changes of partial hysterectomy are noted. Simple appearing left adnexal cyst is noted on the left measures 2.9 cm. No right adnexal lesion is seen. Other: No abdominal wall hernia or abnormality. No abdominopelvic ascites. Musculoskeletal: No acute or significant osseous findings. IMPRESSION: Normal-appearing appendix. Simple appearing left adnexal cyst measuring 2.9 cm. No follow-up imaging recommended. Note: This recommendation does not apply to premenarchal patients and to those with increased risk (genetic, family history, elevated tumor markers or other high-risk factors) of ovarian cancer. Reference: JACR 2020 Feb; 17(2):248-254 Postsurgical changes as described. Fatty liver. Electronically Signed   By: Alcide Clever M.D.   On: 10/20/2020 14:30    ____________________________________________   PROCEDURES  Procedure(s) performed:   Procedures  None  ____________________________________________   INITIAL IMPRESSION / ASSESSMENT AND PLAN / ED  COURSE  Pertinent labs & imaging results that were available during my care of the patient were reviewed by me and considered in my medical decision making (see chart for details).   Patient presents emergency department with abdominal pain with mild distention.  Some right-sided tenderness on exam.  Lab work here is reassuring.  Patient has no UTI symptoms to suspect developing pyelonephritis or GU etiology for pain.  Subjective fever with weakness but afebrile here.  No leukocytosis.  Lab work is largely unremarkable.  COVID and flu are negative.  CT abdomen pelvis obtained showing no acute findings in the abdomen.  She does have an adrenal lesion but radiology does not recommend routine follow-up.  I have mentioned this in the AVS for discussion with PCP.   After CT imaging resulted I called the patient's gastroenterologist and spoke with Lewie Loron NP with [facility] to discuss the patient's presentation and CT findings.  They will follow with the patient as an outpatient.  They will call in an increased dose of the patient's PPI.  I will add Bentyl.  No further work-up at this time on an emergent basis.  Discussed symptoms and results with patient who  will follow with GI and PCP as an outpatient. Discussed ED return precautions.    ____________________________________________  FINAL CLINICAL IMPRESSION(S) / ED DIAGNOSES  Final diagnoses:  Generalized abdominal pain     MEDICATIONS GIVEN DURING THIS VISIT:  Medications  morphine 4 MG/ML injection 4 mg (4 mg Intravenous Given 10/20/20 1213)  ondansetron (ZOFRAN) injection 4 mg (4 mg Intravenous Given 10/20/20 1212)  sodium chloride 0.9 % bolus 1,000 mL (1,000 mLs Intravenous New Bag/Given 10/20/20 1303)  iohexol (OMNIPAQUE) 300 MG/ML solution 100 mL (100 mLs Intravenous Contrast Given 10/20/20 1422)     NEW OUTPATIENT MEDICATIONS STARTED DURING THIS VISIT:  New Prescriptions   DICYCLOMINE (BENTYL) 20 MG TABLET    Take 1 tablet  (20 mg total) by mouth 3 (three) times daily as needed for spasms (abdominal cramping).    Note:  This document was prepared using Dragon voice recognition software and may include unintentional dictation errors.  Alona Bene, MD, [facility]    Allena Pietila, Arlyss Repress, MD 10/20/20 [id number]

## 2020-10-20 NOTE — ED Triage Notes (Addendum)
Pt reports right sided abdominal for pain x one week. Pt advised to have CT per provider . Pt reports weakness, fever, vomiting and diarrhea. Pt reports her stomach is swollen and tight. Seen by GI last Wednesday

## 2020-10-20 NOTE — Telephone Encounter (Signed)
Noted.  Agree with ED evaluation.

## 2020-10-20 NOTE — Telephone Encounter (Signed)
Please call patient, she has been very sick and needs to speak to a nurse, wants to know about her labs

## 2020-10-20 NOTE — Telephone Encounter (Signed)
Spoke with pt. Pt was notified of Lewie Loron Np's recommendations. She is aware that she will increase Pantoprazole 40 mg twice daily 30 mins before pts breakfast and evening meal, follow a bland diet, and take Bentyl as needed for loose stool. Pt will hold Bentyl if constipation occurs and will hold Linzess while having loose stool. Pt is also aware that she doesn't need to take Linzess and Bentyl together per Lewie Loron, NP. Pt will call back if not improving.

## 2020-10-20 NOTE — Telephone Encounter (Signed)
Spoke with pt. Pt is having some confusion this morning and sounds very different on the phone. Pt has had a fever for 4 days and pts fever was very high. Pt couldn't remember how high her fever was when she took it but remember it was high, she felt dizzy, vision is blurry and mouth is dry. Pt was asked if she had someone to take her to the ED for evaluation or pt was notified that she needs to call EMS. Per pt her daughters are at the house with her and they will take her to the ED for eval.   Lewie Loron, PA please review pts labs completed on Friday.

## 2020-10-20 NOTE — Telephone Encounter (Signed)
Received fax from [facility], CT abd/pelvis was denied. Per fax she would need and US abdomen first.  Per telephone note from today, Helmut Muster advised pt to go to ED.  Routing to [employer] NP.

## 2020-10-20 NOTE — Discharge Instructions (Signed)
You were seen in the emergency department today with abdominal pain.  You do have some small lesions on your adrenal gland but the radiologist does not feel this needs follow-up and looks benign.  This does not appear to be causing your pain.  I spoke with your gastroenterology team and they will be increasing your proton pump inhibitor medication.  I have also called in medication to your pharmacy.  Return to the emergency department any new or suddenly worsening symptoms.

## 2020-10-20 NOTE — Telephone Encounter (Signed)
Reviewed ED visit. Spoke with ED physician.  Labs unrevealing. CT with left adnexal cyst.   ED physician sending home with Bentyl. I am increasing Protonix to BID.   I saw patient last week in office. Please have her follow bland diet, PPI BID (sending into pharmacy), and give progress report in 24-48 hours. I won't be here, so please address with another provider.   If she continues to have RUQ pain, would recommend EGD. Gallbladder is absent.

## 2020-10-21 NOTE — Telephone Encounter (Signed)
Noted, no further recommendations at this time. 

## 2020-10-28 ENCOUNTER — Telehealth: Payer: Self-pay | Admitting: Gastroenterology

## 2020-10-28 ENCOUNTER — Other Ambulatory Visit: Payer: Self-pay

## 2020-10-28 DIAGNOSIS — R197 Diarrhea, unspecified: Secondary | ICD-10-CM

## 2020-10-28 NOTE — Telephone Encounter (Signed)
NOTED LAB FORM FAXED TO [facility] PER ANNA BOONE

## 2020-10-28 NOTE — Telephone Encounter (Signed)
Mary Parker,  Patient needs to complete stool studies. I have placed them, just need to be released and faxed to [facility].

## 2020-11-21 ENCOUNTER — Telehealth: Payer: Self-pay | Admitting: Neurology

## 2020-11-21 NOTE — Telephone Encounter (Signed)
Patient has a Botox appointment on 2/23. I called [facility] and spoke with Rynedell to check order status. Botox TBD 1/26.

## 2020-11-26 ENCOUNTER — Ambulatory Visit: Payer: Medicaid Other | Admitting: Physical Therapy

## 2020-11-26 NOTE — Telephone Encounter (Signed)
(  2) 100U vials of Botox delivered today from [facility]. 

## 2020-12-04 NOTE — Telephone Encounter (Signed)
Filled out PA form for [facility] in case plan requires auth. Gave to Maralyn Sago to sign and faxed it to [phone] with notes.

## 2020-12-04 NOTE — Telephone Encounter (Signed)
Received fax from [facility] stating "we received a prior authorization request for the member and product listed above. The Community and [facility] Prior Authorization Team is not able to review this request because the requested product currently does not require prior authorization. Based on information reviewed, the requested prescription is currently authorized for coverage by the plan until 02/22/21. Please resubmit this request within 30 days of authorization expiration date." Reference #[id number].

## 2020-12-09 LAB — C. DIFFICILE GDH AND TOXIN A/B
GDH ANTIGEN: DETECTED
MICRO NUMBER:: 11504094
SPECIMEN QUALITY:: ADEQUATE
TOXIN A AND B: NOT DETECTED

## 2020-12-09 LAB — CLOSTRIDIUM DIFFICILE TOXIN B, QUALITATIVE, REAL-TIME PCR: Toxigenic C. Difficile by PCR: NOT DETECTED

## 2020-12-10 ENCOUNTER — Telehealth: Payer: Self-pay | Admitting: Neurology

## 2020-12-10 MED ORDER — PREDNISONE 5 MG PO TABS: ORAL_TABLET | ORAL | 0 refills | Status: DC

## 2020-12-10 NOTE — Telephone Encounter (Signed)
Called pt and relayed about prednisone taper pack ordered.  I asked her about Bernita Raisin, she said was not approved.  I told her that may try again to see if will approve.  Will let SS/NP know.  She wanted to let SS/NP know last botox injection sites hurt/sore.  Caused her 2 wks migraine.  FYI.

## 2020-12-10 NOTE — Telephone Encounter (Signed)
Pt called, having migraines on left side, started Sunday 7 pm. Monday morning, bell palsy on right side has come back. Tuesday  bell palsy and migraine. Tuesday migraine was light. Wednesday morning, 4 am felt pressure in my head, felt like eye balls would pop out of my head. Right now most of the pain is gone, but I can't make my eyes open, pain coming back on the left side again. Would like a call from the nurse.

## 2020-12-10 NOTE — Telephone Encounter (Signed)
Please call, clarify, what is meant by Bell's palsy, is that drooping? she has history of migraines with neurological symptoms. I am not clear if she referencing a new problem. Might be best to have her PCP/urgenct care look at her, make sure not acute Bell's palsy.

## 2020-12-10 NOTE — Addendum Note (Signed)
Addended by: Glean Salvo on: 12/10/2020 04:41 PM   Modules accepted: Orders

## 2020-12-10 NOTE — Telephone Encounter (Signed)
I sent in prednisone taper pack 5 mg, 6 day as has been done before. I can't remember if we have tried Bernita Raisin, but we could do that too if needed.

## 2020-12-10 NOTE — Telephone Encounter (Signed)
I called pt.  She has migraines, gets BOTOX every 3 months.  Scheduled 12-24-20.  Migraine started Sunday 1900 L side head/eye.  Monday AM noted R eye pain and R eyelid/lip droop, has had previously been to [facility] (told migraine).  That subsided but headache back tues am then Wednesday woke her up from sleep.  She has taken endomethacin Tuesday (3 tabs) tylenol today, ondansetron today.  Severe pain Wednesday.  Has taken prednisone taper before for migraine per Dr. Anne Hahn,  No infusion.  Not pregnant.  Please advise.

## 2020-12-11 LAB — GASTROINTESTINAL PATHOGEN PANEL PCR

## 2020-12-11 MED ORDER — UBRELVY 100 MG PO TABS: 100.0000 mg | ORAL_TABLET | ORAL | 11 refills | Status: DC | PRN

## 2020-12-11 NOTE — Telephone Encounter (Signed)
Continue the prednisone, try the Bernita Raisin, I sent script this morning.

## 2020-12-11 NOTE — Telephone Encounter (Signed)
I sent Mary Parker to try again, see if better coverage.

## 2020-12-11 NOTE — Telephone Encounter (Signed)
Spoke to pt and let her know that SS/NP did send in prescription for ubrelvy, per her pharmacy will require PA.  I relayed will got to this as soon as we can.

## 2020-12-11 NOTE — Telephone Encounter (Signed)
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  Agree with the use of prednisone currently.  The patient has alprazolam to use if needed for sleep.

## 2020-12-11 NOTE — Addendum Note (Signed)
Addended by: Glean Salvo on: 12/11/2020 06:01 AM   Modules accepted: Orders

## 2020-12-11 NOTE — Telephone Encounter (Signed)
Pt has called to report she got the prednisone around 7 last night.  Pt was up all night.  Pt is asking if something else can be called in for the pain of the migraine.  Pt has tried Tylenol as well.  Please call, pt was asked if she would prefer to [facility].  Pt states unable to open her eyes to do so.

## 2020-12-11 NOTE — Telephone Encounter (Signed)
(  Key: [id number]) Rx #: [id number] Ubrelvy 100MG  tablets  Wait for Determination Please wait for [employer] 2017 [employer] to return a determination.

## 2020-12-12 ENCOUNTER — Encounter: Payer: Self-pay | Admitting: Neurology

## 2020-12-15 ENCOUNTER — Telehealth: Payer: Self-pay | Admitting: Neurology

## 2020-12-15 ENCOUNTER — Telehealth: Payer: Self-pay

## 2020-12-15 NOTE — Telephone Encounter (Signed)
I don't think this response is from steroid taper for prolonged migraine. She probably needs to go to the ER or urgent care to find out what is going on. Difficult to assess at this point.  Hi Mary Parker  The last thing I remember is thismorning my face was burning hot but rest of my body cold,  I sat down at kitchen table with cool rag feeling like i was going to pass out. Im not sure if i passed out blacked out or what happened, but i just came too and i was in my room but i dont remember. My mom said i talked to her. But i dont remember i am soo confused. I am extremely thirsty, dace burning up it is 101.7 but rest of my body ice cold, feels like head is boiling, and very confused.

## 2020-12-15 NOTE — Telephone Encounter (Signed)
Spoke to pt.  She states that she took 6 tablets on day one Thursday.  Friday took 1 one tablet, felt like was going to pass out/did black out.   Only difference was took blister pack of prednisone and not bottle of tablets. (ingredients??).  She has allergy of dexamethasone of passing out.  She said her migraine did break, she just feels tired.  I told her to hydrate well.  She has appt 12-24-20 for BOTOX and will discuss then.

## 2020-12-15 NOTE — Telephone Encounter (Signed)
PA for Bernita Raisin denied   (Key: [id number]) Rx #: [id number] Bernita Raisin 100MG  tablets  Here are the policy requirements your request did not meet: Per your health plan's criteria, this drug is covered if you meet the following: (1) You have not had more than 15 headache days per month during the past 6 months. (2) You do not have kidney failure (end-stage renal disease, creatinine clearance less than 28mL/min). (3) You have tried at least two preferred triptans: rizatriptan tablet/orally disintegrating tablet (generic for Maxalt/Maxalt MLT) and sumatriptan nasal spray/tablet/vial (generic for Imitrex). The information provided does not show that you meet the criteria listed above. Please speak with your doctor about your choices. This decision was made per the 12m of [facility] Migraine Therapy - CGRP Guideline.

## 2020-12-16 ENCOUNTER — Encounter: Payer: Self-pay | Admitting: Neurology

## 2020-12-17 ENCOUNTER — Encounter: Payer: Self-pay | Admitting: *Deleted

## 2020-12-17 ENCOUNTER — Encounter: Payer: Self-pay | Admitting: Neurology

## 2020-12-17 NOTE — Telephone Encounter (Signed)
Pending expedited Appeal  Phone #[phone]   Note for APPEALS dept   "In the past, she has been on multiple medications for headache including trazodone, nortriptyline, Topamax, gabapentin, Lyrica, propranolol, verapamil, and Cymbalta.  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 The patient cannot take triptan medications as a rescue drug because of recent issues with hemiplegic migraine, the nonsteroidal anti-inflammatory medications are not fully effective"  C. Lesia Sago MD 07/12/2019 7:50 AM

## 2020-12-19 ENCOUNTER — Other Ambulatory Visit: Payer: Self-pay | Admitting: Gastroenterology

## 2020-12-19 MED ORDER — VANCOMYCIN HCL 125 MG PO CAPS: 125.0000 mg | ORAL_CAPSULE | Freq: Four times a day (QID) | ORAL | 0 refills | Status: AC

## 2020-12-22 ENCOUNTER — Encounter: Payer: Self-pay | Admitting: Gastroenterology

## 2020-12-22 ENCOUNTER — Encounter (HOSPITAL_COMMUNITY): Payer: Self-pay | Admitting: *Deleted

## 2020-12-22 ENCOUNTER — Emergency Department (HOSPITAL_COMMUNITY)
Admission: EM | Admit: 2020-12-22 | Discharge: 2020-12-22 | Disposition: A | Discharge status: Home / Self Care | Payer: Medicaid Other | Attending: Emergency Medicine | Admitting: Emergency Medicine

## 2020-12-22 ENCOUNTER — Telehealth: Payer: Self-pay | Admitting: Gastroenterology

## 2020-12-22 ENCOUNTER — Ambulatory Visit (INDEPENDENT_AMBULATORY_CARE_PROVIDER_SITE_OTHER): Payer: Medicaid Other | Admitting: Gastroenterology

## 2020-12-22 ENCOUNTER — Other Ambulatory Visit: Payer: Self-pay

## 2020-12-22 VITALS — BP 112/78 | HR 83 | Temp 96.8°F | Ht 64.0 in | Wt 214.8 lb

## 2020-12-22 DIAGNOSIS — R109 Unspecified abdominal pain: Secondary | ICD-10-CM | POA: Insufficient documentation

## 2020-12-22 DIAGNOSIS — Z5321 Procedure and treatment not carried out due to patient leaving prior to being seen by health care provider: Secondary | ICD-10-CM | POA: Diagnosis not present

## 2020-12-22 DIAGNOSIS — R6881 Early satiety: Secondary | ICD-10-CM

## 2020-12-22 DIAGNOSIS — R110 Nausea: Secondary | ICD-10-CM | POA: Insufficient documentation

## 2020-12-22 DIAGNOSIS — R634 Abnormal weight loss: Secondary | ICD-10-CM | POA: Diagnosis not present

## 2020-12-22 DIAGNOSIS — R1084 Generalized abdominal pain: Secondary | ICD-10-CM

## 2020-12-22 DIAGNOSIS — K6289 Other specified diseases of anus and rectum: Secondary | ICD-10-CM | POA: Insufficient documentation

## 2020-12-22 DIAGNOSIS — R140 Abdominal distension (gaseous): Secondary | ICD-10-CM

## 2020-12-22 LAB — CBC
HCT: 41.3 % (ref 36.0–46.0)
Hemoglobin: 12.9 g/dL (ref 12.0–15.0)
MCH: 29.5 pg (ref 26.0–34.0)
MCHC: 31.2 g/dL (ref 30.0–36.0)
MCV: 94.5 fL (ref 80.0–100.0)
Platelets: 270 10*3/uL (ref 150–400)
RBC: 4.37 MIL/uL (ref 3.87–5.11)
RDW: 13.8 % (ref 11.5–15.5)
WBC: 9.8 10*3/uL (ref 4.0–10.5)
nRBC: 0 % (ref 0.0–0.2)

## 2020-12-22 LAB — COMPREHENSIVE METABOLIC PANEL
ALT: 18 U/L (ref 0–44)
AST: 18 U/L (ref 15–41)
Albumin: 4 g/dL (ref 3.5–5.0)
Alkaline Phosphatase: 71 U/L (ref 38–126)
Anion gap: 8 (ref 5–15)
BUN: 5 mg/dL — ABNORMAL LOW (ref 6–20)
CO2: 27 mmol/L (ref 22–32)
Calcium: 9.3 mg/dL (ref 8.9–10.3)
Chloride: 101 mmol/L (ref 98–111)
Creatinine, Ser: 0.83 mg/dL (ref 0.44–1.00)
GFR, Estimated: >60 mL/min (ref >60)
Glucose, Bld: 91 mg/dL (ref 70–99)
Potassium: 3.8 mmol/L (ref 3.5–5.1)
Sodium: 136 mmol/L (ref 135–145)
Total Bilirubin: 0.6 mg/dL (ref 0.3–1.2)
Total Protein: 7.7 g/dL (ref 6.5–8.1)

## 2020-12-22 LAB — LIPASE, BLOOD: Lipase: 21 U/L (ref 11–51)

## 2020-12-22 NOTE — Progress Notes (Signed)
Referring Provider: Benita StabileHall, John Z, MD Primary Care Physician:  Benita StabileHall, John Z, MD Primary GI Physician: Dr. Jena Gaussourk  Chief Complaint  Patient presents with  . Diarrhea    Had episode Friday, no bm since. Started Vancomycin for C. Diff Friday  . Abdominal Pain    Mostly left side    HPI:   Mary Parker is a 40 y.o. female presenting today with a history of chronic LLQ abdominal pain, constipation, and GERD presenting today for acute visit due to worsening generalized abdominal pain, bloating, and nausea without vomiting.  She was last seen in December 2021 and reported acute onset of RUQ pain wrapping around to back x3 days with associated nausea/vomiting. Pain improved with laying still, worsened by eating. No hematemesis. No melena. Stool looks oily when having BM, which is diarrhea. Not consistent BMs. Was taking Linzess 72 mcg until a few days ago. Planned for labs and stat CT.   CBC, CMP, and Lipase all normal.  Ultimately, she presented to the emergency room 10/20/2020 and had CT A/P with contrast with evidence of fatty liver, postcholecystectomy, unremarkable pancreas, normal appendix, single fluid-filled loop of small bowel although no true obstructive changes, simple appearing left adnexal cyst measuring 2.9 cm.  She was discharged with Bentyl.  Lewie LoronAnna Boone, NP reviewed ED visit and increase Protonix to twice daily.  Patient message on 12/27 reporting diarrhea, bloating.  Had 8 BMs by noon.  Stool studies were ordered.  Stool studies were completed 12/08/2020.  GI pathogen panel canceled due to nonamplification of internal control suggesting presence of PCR inhibitors.  C. difficile testing with GDH antigen detected, toxin A/B not detected, and PCR testing negative.  Diarrhea had improved with 4 loose BMs some days and other days no BMs. Continued with postprandial right sided abdominal pain and swelling. Recommended continuing Bentyl as needed, PPI BID, and OV to discuss arranging EGD.    Patient message 12/19/2020 reporting return of diarrhea. She was prescribed a 10-day course of vancomycin was called into the pharmacy on 12/19/2020.  Patient message 2/21 stating she has been up since 5 AM with abdominal pain, feeling full and round. No diarrhea since starting vancomycin on Friday and has not taken Bentyl. She was scheduled for OV today.   Today she states:  Friday she had a bright yellow watery BM early in the morning and continued on with significant watery BMs. Had at least 8 BMs on Friday. Prior to onset, she had a Migraine for 2 weeks and was taking Tylenol and indomethacin. Doesn't remember taking any antibiotics prior to onset of diarrhea in December. In general, bowels fluctuate from loose/watery to formed. This is chronic with history of IBS. She doesn't usually have a BM daily. Started Vamcomycin Friday QID. Hasn't had a BM since Friday. Passed a little gas yesterday and this morning, but none since. Abdomen is very distended. States she looks like she is 9 months pregnant. Feels like she should pass gas but can't. When she tries, she feels her "insides are ripping". Abdomen feels like it is bubbling. This morning, she woke up at 5 AM with severe severe left sided abdominal pain along with generalized abdominal pain. Pain is currently about 9/10 and generalized. Nausea also acutely worsened today, feels she needs to vomit but hasn't.  Ribs were hurting over the weekend. No blood in the stool. No black stools.   Feels dehydrated. Not eating much. Hasn't been eating much for several weeks. Early satiety has  been a problem since seeing Tobi Bastos in December. Migraines also impact her ability to eat. Had 1 piece of bread yesterday. Has been following a bland diet. Pain worseness with eating. When eating or drinking, she has worsening pain and feels nauseated. Down 10 lbs over the last 2 months. No GERD symptoms. Feels very dizzy. States she may have passed out this morning or just fell  asleep. Couldn't drive herself here. Doesn't remember peeing today. Can't tell me the color of her urine.    Last took Bentyl sometime last week, before Friday. Hasn't been taking Linzess for a while.  Reports low grade fever over the weekend. Not above 100.1.   Past Medical History:  Diagnosis Date  . Anxiety   . Arthritis    both knees  . Body aches 12/11/2014  . Chronic insomnia   . Chronic low back pain 05/29/2015  . Common migraine 12/19/2014  . Depression 12/11/2014  . Fibrocystic disease of both breasts   . Fibromyalgia 12/19/2014  . GERD (gastroesophageal reflux disease)   . History of hiatal hernia   . IBS (irritable bowel syndrome)   . Knee pain, right   . Migraines   . Rebound headache 12/19/2014  . Right ovarian cyst 04/25/2017   Complex, check CA 125 and repeat US in 6 weeks   . Sciatica of left side   . Seizures (HCC)    remote past, felt it was related to Chantix   . Sleep apnea   . Vitamin D deficiency   . Wears glasses     Past Surgical History:  Procedure Laterality Date  . BILATERAL SALPINGECTOMY Bilateral 06/21/2017   Procedure: BILATERAL SALPINGECTOMY;  Surgeon: Tilda Burrow, MD;  Location: [facility];  Service: Gynecology;  Laterality: Bilateral;  . CESAREAN SECTION  [phone]  . CHOLECYSTECTOMY  2001  . COLONOSCOPY WITH PROPOFOL N/A 04/07/2017   Dr. Lovena Neighbours: Normal, abnormal CT likely artifactual  . ENDOMETRIAL ABLATION    . ESOPHAGOGASTRODUODENOSCOPY  2002   [facility]: normal  . KNEE ARTHROSCOPY WITH LATERAL RELEASE Right 04/18/2018   Procedure: Right knee arthroscopy with chondroplasty and lateral release;  Surgeon: Yolonda Kida, MD;  Location: [facility];  Service: Orthopedics;  Laterality: Right;  60 mins  . SUPRACERVICAL ABDOMINAL HYSTERECTOMY N/A 06/21/2017   Procedure: HYSTERECTOMY SUPRACERVICAL ABDOMINAL;  Surgeon: Tilda Burrow, MD;  Location: [facility];  Service: Gynecology;  Laterality: N/A;  . TUBAL LIGATION       Current Outpatient Medications  Medication Sig Dispense Refill  . ALPRAZolam (XANAX) 0.5 MG tablet Take 0.5 mg by mouth at bedtime as needed for sleep.     . botulinum toxin Type A (BOTOX) 100 units SOLR injection Inject 155units IM into head and neck muscles every 3 months by the provider. 2 each 3  . budesonide-formoterol (SYMBICORT) 160-4.5 MCG/ACT inhaler Inhale 2 puffs into the lungs 2 (two) times daily as needed (for flares).     . cyanocobalamin (,VITAMIN B-12,) 1000 MCG/ML injection Inject 1,000 mcg into the skin every 30 (thirty) days.   0  . escitalopram (LEXAPRO) 20 MG tablet Take 20 mg by mouth at bedtime.   0  . indomethacin (INDOCIN) 25 MG capsule TAKE 1 TO 2 CAPSULES BY MOUTH AS NEEDED FOR HEADACHE. NO MORE THAN 6 CAPSULES PER WEEK (Patient taking differently: Take 25-50 mg by mouth daily as needed (headache). NO MORE THAN 6 CAPSULES PER WEEK) 30 capsule 1  . ondansetron (ZOFRAN-ODT) 4 MG disintegrating tablet  DISSOLVE 1 TABLET(4 MG) ON THE TONGUE EVERY 8 HOURS AS NEEDED FOR NAUSEA OR VOMITING (Patient taking differently: Take 4 mg by mouth as needed for nausea or vomiting.) 30 tablet 6  . pantoprazole (PROTONIX) 40 MG tablet Take 1 tablet (40 mg total) by mouth 2 (two) times daily before a meal. 60 tablet 3  . tiZANidine (ZANAFLEX) 2 MG tablet Take 1 tablet (2 mg total) by mouth at bedtime.    Marland Kitchen Ubrogepant (UBRELVY) 100 MG TABS Take 100 mg by mouth as needed (take 1 tablet at onset of headache, may repeat 2 hours after initial dose, max is 200 mg/24 hours). 12 tablet 11  . vancomycin (VANCOCIN) 125 MG capsule Take 1 capsule (125 mg total) by mouth 4 (four) times daily for 10 days. 40 capsule 0  . Vitamin D, Ergocalciferol, (DRISDOL) 50000 units CAPS capsule Take 50,000 Units by mouth every Sunday.   0  . dicyclomine (BENTYL) 20 MG tablet Take 1 tablet (20 mg total) by mouth 3 (three) times daily as needed for spasms (abdominal cramping). (Patient not taking: Reported on 12/22/2020)  20 tablet 0  . linaclotide (LINZESS) 72 MCG capsule Take 1 capsule (72 mcg total) by mouth daily before breakfast. (Patient not taking: No sig reported) 30 capsule 3   No current facility-administered medications for this visit.    Allergies as of 12/22/2020 - Review Complete 12/22/2020  Allergen Reaction Noted  . Bupropion Other (See Comments) 05/07/2015  . Duloxetine hcl Hives, Itching, and Rash 04/04/2017  . Gabapentin Itching 03/03/2017  . Meloxicam Other (See Comments) 05/28/2016  . Pregabalin Other (See Comments) 04/04/2017  . Amitriptyline Other (See Comments) 10/23/2018  . Ciprofloxacin Hives, Swelling, and Other (See Comments) 12/11/2014  . Desvenlafaxine succinate er Other (See Comments) 11/09/2017  . Hydrocodone-acetaminophen Swelling 04/18/2018  . Milnacipran Nausea Only and Other (See Comments) 06/15/2017  . Nortriptyline Nausea And Vomiting and Other (See Comments) 04/01/2015  . Topiramate Swelling and Other (See Comments) 04/01/2015  . Decadron [dexamethasone] Other (See Comments) 03/05/2019  . Prednisone  12/15/2020  . Doxycycline Nausea Only 06/29/2017  . Percocet [oxycodone-acetaminophen] Nausea Only 06/29/2017    Family History  Problem Relation Age of Onset  . Hypertension Mother   . Other Mother        abnormal cells; had hyst  . Obesity Daughter   . Diabetes Daughter 65       youngest  . Obesity Son   . Cancer Maternal Grandmother   . Osteoporosis Maternal Grandmother   . Cancer Maternal Grandfather   . Obesity Daughter   . Diabetes Father   . Hypertension Father   . Alcohol abuse Father   . Colon cancer Neg Hx     Social History   Socioeconomic History  . Marital status: Divorced    Spouse name: Not on file  . Number of children: 3  . Years of education: HS  . Highest education level: Not on file  Occupational History  . Occupation: [employer]  Tobacco Use  . Smoking status: Former Smoker    Packs/day: 0.25    Years: 25.00    Pack  years: 6.25    Types: Cigarettes    Quit date: 04/02/2019    Years since quitting: 1.7  . Smokeless tobacco: Never Used  Vaping Use  . Vaping Use: Never used  Substance and Sexual Activity  . Alcohol use: No  . Drug use: No  . Sexual activity: Not Currently    Birth  control/protection: Surgical    Comment: supracervical hyst  Other Topics Concern  . Not on file  Social History Narrative   Patient is right handed.   Patient drinks 1-2 cups of caffeine dailyy.   Patient lives mom stepdad, and children.   Social Determinants of Health   Financial Resource Strain: Not on file  Food Insecurity: Not on file  Transportation Needs: Not on file  Physical Activity: Not on file  Stress: Not on file  Social Connections: Not on file    Review of Systems: Gen: Denies cold or flu like symptoms.   CV: Denies chest pain. Resp: Denies dyspnea.  GI: See HPI Heme: See HPI  Physical Exam: BP 112/78   Pulse 83   Temp (!) 96.8 F (36 C) (Temporal)   Ht 5\' 4"  (1.626 m)   Wt 214 lb 12.8 oz (97.4 kg)   BMI 36.87 kg/m  General:   Alert and oriented. Pleasant and cooperative but looks like she doesn't feel well. Moving very slowly, holding her lower abdomen when walking.  Head:  Normocephalic and atraumatic. Heart:  S1, S2 present without murmurs appreciated. Lungs:  Clear to auscultation bilaterally. No wheezes, rales, or rhonchi. No distress.  Abdomen:  +BS. Abdomen appears distended but is fairly soft. Mild generalized TTP with moderate TTP in LLQ, RUQ, and epigastric area. No rebound or guarding. No HSM or masses noted. Msk:  Symmetrical without gross deformities. Normal posture. Extremities:  Without edema. Neurologic:  Alert and  oriented x4 Psych:  Alert and cooperative. Normal mood and affect.

## 2020-12-22 NOTE — Assessment & Plan Note (Addendum)
Addressed under abdominal pain and early satiety.  

## 2020-12-22 NOTE — Assessment & Plan Note (Addendum)
40 y.o. female with history of GERD, IBS, chronic LLQ abdominal pain, intermittent abdominal bloating, previously with tendency towards constipation although some alternation with diarrhea, who has more recently reported RUQ abdominal pain, early satiety, nausea, and diarrhea (no longer taking Linzess). Evaluation with CBC, CMP, Lipase, and CT A/P unrevealing in December 2021, now empirically started on vancomycin for possible C. diff after indeterminate C. diff testing on 12/09/19. GI pathogen panel was cancelled by the lab due to non-amplification of control suggesting PCR inhibitors. Vancomycin was started on 2/18. Had previously been prescribed Bentyl for diarrhea, but hasn't taken this since sometime last week. Since starting vancomycin, she hasn't had a BM, woke up with significantly worsening generalized abdomin pain this morning, most severe on the left side, with worsening bloating and distension, trouble passing gas this afternoon with associated pain, and worsening nausea without vomiting. Also with decreased oral intake as eating worsens abdominal pain, 10 lb weight loss over the last 2 months, feels dehydrated, lightheaded, and can't remember if she has urinated at all today. Reports mild fever, not above 100.1 over the weekend. No fever today.  Patient doesn't appear to feel well today. Is walking slowly, holding the bottom of her abdomen, has lightheadedness when changing positions. States she wasn't able to drive herself today due to dizziness. Abdominal exam with protuberant/somewhat distended abdomen, but fairly soft with mild generalized TTP, moderate TTP in the LLQ, epigastric and RUQ region. Prior colonoscopy in 2018 with normal exam.   Concerned patient may be developing an obstruction. May also have colitis. Less consistent with diverticulitis. Can't rule out celiac disease considering abdominal pain and bloating. May also have upper GI etiology contributing to her abdominal pain and bloating  such as PUD, gastritis, duodenitis, H. pylori, and/or gastric outlet obstruction. In general, I am not convinced patient has C. diff. Query whether diarrhea flares may be related to IBS. I feel she needs a CT STAT and STAT labs; however, with significant dizziness, decreased oral intake, and inability to remember if she has urinated today, I have recommended she proceed to the emergency room for further evaluation as she likely needs IV fluids.   She will continue her current medications for now including Vancomycin and PPI BID. Continue to hold Bentyl and Linzess. I will review ED evaluation and have further recommendations to follow.   She will need EGD in the near future for further evaluation of early satiety, RUQ abdominal pain, and weight loss. We are holding off on scheduling this for now.

## 2020-12-22 NOTE — Telephone Encounter (Signed)
Patient coming in today  

## 2020-12-22 NOTE — Assessment & Plan Note (Addendum)
Few month history of early satiety with associated RUQ abdominal pain, nausea, bloating, decreased oral intake, and weight loss of 10 lbs over the last 2 months, 29 lb weight loss over the last 8 months. CBC, CMP, Lipase, and CT A/P with contrast in December 2021 unrevealing. History of cholecystectomy. Also with diarrhea as discussed above and empirically started on vancomycin due to indeterminate C. difficile testing.  Unfortunately, since starting vancomycin on 2/18, she has not had a bowel movement and has worsening abdominal pain with abdominal distention, bloating, and worsening nausea without vomiting as discussed above.  This is the primary focus of today's visit and is discussed above. She has been referred to the ED today. I will follow-up on their evaluation with additional recommendations to follow.   Patient will need an EGD in the near future for further evaluation of her upper GI symptoms to evaluate for H. Pylori, PUD, gastritis, duodenitis, gastric outlet obstruction. However, we are holding off on scheduling at this time. Celiac disease also in the differential. She is to continue Protonix 40 mg BID for now.

## 2020-12-22 NOTE — Telephone Encounter (Signed)
Stacey,  Can we see if there is an opening this week for patient to come in? Having abdominal pain, diarrhea.

## 2020-12-22 NOTE — ED Triage Notes (Signed)
Pt c/o abdominal pain and nausea that worsened this morning but started Friday. Pt reports she has colitis and is currently taking Vancomycin. She was seen by GI NP today and they told her to come to ED for further evaluation. Denies vomiting.

## 2020-12-22 NOTE — Telephone Encounter (Signed)
Routing...

## 2020-12-22 NOTE — Patient Instructions (Addendum)
Please proceed to [facility] Emergency Department for further evaluation of your abdominal pain, abdominal distension, nausea, and lightheadedness.   I will follow-up on their evaluation and call you with any additional recommendations.   Continue taking Vancomycin 4 times daily for now.   Continue Protonix 40 mg twice daily for now.   Ermalinda Memos, PA-C [facility]

## 2020-12-23 ENCOUNTER — Ambulatory Visit (HOSPITAL_COMMUNITY)
Admission: RE | Admit: 2020-12-23 | Discharge: 2020-12-23 | Disposition: A | Discharge status: Home / Self Care | Payer: Medicaid Other | Source: Ambulatory Visit | Attending: Gastroenterology | Admitting: Gastroenterology

## 2020-12-23 ENCOUNTER — Other Ambulatory Visit: Payer: Self-pay | Admitting: Gastroenterology

## 2020-12-23 DIAGNOSIS — R197 Diarrhea, unspecified: Secondary | ICD-10-CM | POA: Insufficient documentation

## 2020-12-23 DIAGNOSIS — R140 Abdominal distension (gaseous): Secondary | ICD-10-CM | POA: Diagnosis present

## 2020-12-23 DIAGNOSIS — K5900 Constipation, unspecified: Secondary | ICD-10-CM | POA: Insufficient documentation

## 2020-12-23 NOTE — Progress Notes (Signed)
Spoke with patient this afternoon.  She is feeling much better today.  She has drank 3 bottles of water.  She has not eaten any food as of yet.  No real nausea today. No vomiting. Abdominal pain and abdominal distention significantly improved.  Mild residual pain primarily in the RUQ as previously reported prior to this acute worsening.  She is passing gas today.  Still with no BM.  Feels she is thinking much clearer today.  I recommended abdominal x-ray today and celiac serologies.  I have placed orders.  She will have abdominal x-ray at [facility] and prefers to have celiac serologies completed at [facility].  Also advised that she can follow a liquid diet today to see how she continues to improve. Continue vancomycin 4 times daily. Continue to hold Bentyl and Linzess. Further recommendations to follow.

## 2020-12-24 ENCOUNTER — Ambulatory Visit: Payer: Medicaid Other | Admitting: Neurology

## 2020-12-24 ENCOUNTER — Telehealth: Payer: Self-pay | Admitting: Gastroenterology

## 2020-12-24 ENCOUNTER — Encounter: Payer: Self-pay | Admitting: Neurology

## 2020-12-24 LAB — IGA: Immunoglobulin A: 319 mg/dL — ABNORMAL HIGH (ref 47–310)

## 2020-12-24 LAB — TISSUE TRANSGLUTAMINASE, IGA: (tTG) Ab, IgA: <1 U/mL

## 2020-12-24 NOTE — Telephone Encounter (Signed)
Received patient message today stating she again was feeling lightheaded, light sensitive, and tired.  She recently had a severe migraine, but is not currently having her typical migraine symptoms.  No weakness at this time.  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 States lightheadedness is not new for her and has been going on intermittently for "a while".   Regarding GI symptoms, she had a small oily loose BM yesterday without BRBPR or melena.  Abdominal discomfort primarily in RUQ and stable since yesterday. No nausea or vomiting. Not taking Bentyl.  I am still waiting on celiac testing and abdominal x-ray to result.  Regarding report of lightheadedness, light sensitivity, and tiredness, advised to reach out to her PCP to discuss further.  I do not think this is related to her GI symptoms.  Query neurologic etiology.  Advised if she has any worsening symptoms, confusion, or weakness, she should proceed to the emergency room.

## 2020-12-31 ENCOUNTER — Encounter: Payer: Self-pay | Admitting: Neurology

## 2020-12-31 ENCOUNTER — Ambulatory Visit (INDEPENDENT_AMBULATORY_CARE_PROVIDER_SITE_OTHER): Payer: Medicaid Other | Admitting: Neurology

## 2020-12-31 VITALS — BP 110/73 | HR 74 | Ht 64.0 in | Wt 216.0 lb

## 2020-12-31 DIAGNOSIS — G4733 Obstructive sleep apnea (adult) (pediatric): Secondary | ICD-10-CM | POA: Diagnosis not present

## 2020-12-31 DIAGNOSIS — G4719 Other hypersomnia: Secondary | ICD-10-CM | POA: Diagnosis not present

## 2020-12-31 DIAGNOSIS — G43109 Migraine with aura, not intractable, without status migrainosus: Secondary | ICD-10-CM

## 2020-12-31 MED ORDER — EMGALITY 120 MG/ML ~~LOC~~ SOAJ: 240.0000 mg | SUBCUTANEOUS | 0 refills | Status: DC

## 2020-12-31 MED ORDER — EMGALITY 120 MG/ML ~~LOC~~ SOAJ: 120.0000 mg | SUBCUTANEOUS | 11 refills | Status: DC
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
## 2020-12-31 NOTE — Patient Instructions (Signed)
Start the [facility] for migraine prevention  1st dose is 240 mg monthly injection x 1 Then 120 mg every month forward Continue Ubrelvy at onset of headache  Start using Bipap, will decrease settings See you back in 3 months

## 2020-12-31 NOTE — Progress Notes (Signed)
I have read the note, and I agree with the clinical assessment and plan.  Charles K Willis   

## 2021-01-01 NOTE — Progress Notes (Signed)
Cm sent to [facility] 

## 2021-01-05 ENCOUNTER — Encounter: Payer: Self-pay | Admitting: Neurology

## 2021-01-05 ENCOUNTER — Telehealth: Payer: Self-pay | Admitting: Emergency Medicine

## 2021-01-05 NOTE — Telephone Encounter (Signed)
PA for Emgality started on CMM. [id number]  Awaiting determination from [facility] community plan.

## 2021-01-05 NOTE — Telephone Encounter (Signed)
Request Reference Number: [id number]. EMGALITY INJ 120MG /ML is approved through 04/07/2021. For further questions, call 06/07/2021 at [phone].

## 2021-01-07 ENCOUNTER — Ambulatory Visit: Payer: Medicaid Other | Admitting: Physical Therapy

## 2021-01-12 ENCOUNTER — Emergency Department (HOSPITAL_COMMUNITY): Payer: Medicaid Other

## 2021-01-12 ENCOUNTER — Other Ambulatory Visit: Payer: Self-pay

## 2021-01-12 ENCOUNTER — Emergency Department (HOSPITAL_COMMUNITY)
Admission: EM | Admit: 2021-01-12 | Discharge: 2021-01-12 | Disposition: A | Discharge status: Home / Self Care | Payer: Medicaid Other | Attending: Emergency Medicine | Admitting: Emergency Medicine

## 2021-01-12 ENCOUNTER — Encounter (HOSPITAL_COMMUNITY): Payer: Self-pay | Admitting: *Deleted

## 2021-01-12 DIAGNOSIS — G43809 Other migraine, not intractable, without status migrainosus: Secondary | ICD-10-CM | POA: Diagnosis not present

## 2021-01-12 DIAGNOSIS — Z87891 Personal history of nicotine dependence: Secondary | ICD-10-CM | POA: Diagnosis not present

## 2021-01-12 DIAGNOSIS — R200 Anesthesia of skin: Secondary | ICD-10-CM | POA: Diagnosis not present

## 2021-01-12 DIAGNOSIS — R519 Headache, unspecified: Secondary | ICD-10-CM | POA: Diagnosis present

## 2021-01-12 LAB — CBC WITH DIFFERENTIAL/PLATELET
Abs Immature Granulocytes: 0.02 10*3/uL (ref 0.00–0.07)
Basophils Absolute: 0 10*3/uL (ref 0.0–0.1)
Basophils Relative: 0 %
Eosinophils Absolute: 0.1 10*3/uL (ref 0.0–0.5)
Eosinophils Relative: 1 %
HCT: 38.7 % (ref 36.0–46.0)
Hemoglobin: 12.1 g/dL (ref 12.0–15.0)
Immature Granulocytes: 0 %
Lymphocytes Relative: 12 %
Lymphs Abs: 0.9 10*3/uL (ref 0.7–4.0)
MCH: 29.3 pg (ref 26.0–34.0)
MCHC: 31.3 g/dL (ref 30.0–36.0)
MCV: 93.7 fL (ref 80.0–100.0)
Monocytes Absolute: 0.4 10*3/uL (ref 0.1–1.0)
Monocytes Relative: 5 %
Neutro Abs: 5.8 10*3/uL (ref 1.7–7.7)
Neutrophils Relative %: 82 %
Platelets: 213 10*3/uL (ref 150–400)
RBC: 4.13 MIL/uL (ref 3.87–5.11)
RDW: 13.7 % (ref 11.5–15.5)
WBC: 7.2 10*3/uL (ref 4.0–10.5)
nRBC: 0 % (ref 0.0–0.2)

## 2021-01-12 LAB — BASIC METABOLIC PANEL
Anion gap: 9 (ref 5–15)
BUN: 8 mg/dL (ref 6–20)
CO2: 23 mmol/L (ref 22–32)
Calcium: 8.7 mg/dL — ABNORMAL LOW (ref 8.9–10.3)
Chloride: 103 mmol/L (ref 98–111)
Creatinine, Ser: 0.81 mg/dL (ref 0.44–1.00)
GFR, Estimated: >60 mL/min (ref >60)
Glucose, Bld: 90 mg/dL (ref 70–99)
Potassium: 3.6 mmol/L (ref 3.5–5.1)
Sodium: 135 mmol/L (ref 135–145)

## 2021-01-12 LAB — PROTIME-INR
INR: 1.1 (ref 0.8–1.2)
Prothrombin Time: 13.7 seconds (ref 11.4–15.2)

## 2021-01-12 LAB — APTT: aPTT: 31 seconds (ref 24–36)

## 2021-01-12 MED ORDER — PROCHLORPERAZINE EDISYLATE 10 MG/2ML IJ SOLN
10.0000 mg | Freq: Once | INTRAMUSCULAR | Status: AC
  Administered 2021-01-12: 10 mg via INTRAVENOUS
  Filled 2021-01-12: qty 2

## 2021-01-12 MED ORDER — DIPHENHYDRAMINE HCL 50 MG/ML IJ SOLN
12.5000 mg | Freq: Once | INTRAMUSCULAR | Status: AC
  Administered 2021-01-12: 12.5 mg via INTRAVENOUS
  Filled 2021-01-12: qty 1

## 2021-01-12 MED ORDER — IOHEXOL 350 MG/ML SOLN
75.0000 mL | Freq: Once | INTRAVENOUS | Status: AC | PRN
  Administered 2021-01-12: 75 mL via INTRAVENOUS

## 2021-01-12 MED ORDER — SODIUM CHLORIDE 0.9 % IV BOLUS
1000.0000 mL | Freq: Once | INTRAVENOUS | Status: AC
  Administered 2021-01-12: 1000 mL via INTRAVENOUS

## 2021-01-12 NOTE — ED Triage Notes (Signed)
Pt with HA since Tuesday, noted right facial numbness since around 1130 while sitting at the doctor's office.  Pt not able to remember any of the details.

## 2021-01-12 NOTE — ED Provider Notes (Signed)
[facility] EMERGENCY DEPARTMENT Provider Note   CSN: [id number] Arrival date & time: 01/12/21  1311     History Chief Complaint  Patient presents with  . Headache    Mary Parker is a 40 y.o. 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  Began on Tuesday, proximately 5 to 6 days ago.  Noted she has had some right-sided facial numbness.  She denies any difficulty with word finding.  Felt a few days ago like her eyes were "spinning."  She felt dizzy.  She takes Bernita Raisin for her breakthrough migraines, she took last week or she has not taken since.  She is followed by [facility] neurology.  At 1 point states that her similar is feeling her chronic headache and then states that this does not feel like her typical migraine.  She denies any recent head trauma.  Was seen by PCP earlier today.   Per last neurology note 12/31/2020 patient has right-sided facial numbness, weakness with migraines.  Had MRI in October 2021.  Treated for Bell's palsy however expected to have migraine variant.  States she does have some posterior neck pain.  States she does occasionally get this with her migraines her this is not a chronic variant.  She denies fever, chills, nausea, vomiting, chest pain, shortness of breath abdominal pain, diarrhea, dysuria.  Does have some mild photophobia.   Rates her current head pain 5/10.  Denies additional aggravating or alleviating factors.  History obtained from patient and past medical records.  No interpreter used  HPI     Past Medical History:  Diagnosis Date  . Anxiety   . Arthritis    both knees  . Body aches 12/11/2014  . Chronic insomnia   . Chronic low back pain 05/29/2015  . Common migraine 12/19/2014  . Depression 12/11/2014  . Fibrocystic disease of both breasts   . Fibromyalgia 12/19/2014  . GERD (gastroesophageal reflux disease)   . History of  hiatal hernia   . IBS (irritable bowel syndrome)   . Knee pain, right   . Migraines   . Rebound headache 12/19/2014  . Right ovarian cyst 04/25/2017   Complex, check CA 125 and repeat US in 6 weeks   . Sciatica of left side   . Seizures (HCC)    remote past, felt it was related to Chantix   . Sleep apnea   . Vitamin D deficiency   . Wears glasses     Patient Active Problem List   Diagnosis Date Noted  . Abdominal pain 12/22/2020  . Nausea without vomiting 12/22/2020  . Early satiety 12/22/2020  . Loss of weight 12/22/2020  . RUQ abdominal pain 10/15/2020  . OSA (obstructive sleep apnea) 09/08/2020  . Sleep walking and eating 07/30/2020  . Excessive daytime sleepiness 07/30/2020  . Class 3 severe obesity due to excess calories without serious comorbidity with body mass index (BMI) of 40.0 to 44.9 in adult (HCC) 07/30/2020  . Psychophysiological insomnia 07/30/2020  . Loud snoring 07/30/2020  . Chronic migraine without aura, with intractable migraine, so stated, with status migrainosus 07/12/2019  . Cigarette smoker 05/04/2019  . Obesity, Class II, BMI 35-39.9 05/04/2019  . Stroke-like episode (HCC) s/p tPA 05/03/2019  . Acute gastroenteritis 03/29/2019  . 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 S/P abdominal supracervical subtotal hysterectomy 06/21/2017  . Pelvic adhesions 06/09/2017  . History of ovarian cyst 06/09/2017  .  Right ovarian cyst 04/25/2017  . Encounter for gynecological examination with Papanicolaou smear of cervix 04/19/2017  . Rectal bleeding 04/02/2017  . LLQ pain 04/01/2017  . Constipation 04/01/2017  . Concussion with loss of consciousness 11/16/2016  . Anxiety 10/14/2016  . Chronic low back pain 05/29/2015  . Complicated migraine 12/19/2014  . Fibromyalgia 12/19/2014  . Rebound headache 12/19/2014  . Depression 12/11/2014  . Body aches 12/11/2014    Past Surgical History:  Procedure Laterality Date  . BILATERAL SALPINGECTOMY Bilateral 06/21/2017    Procedure: BILATERAL SALPINGECTOMY;  Surgeon: Tilda Burrow, MD;  Location: [facility];  Service: Gynecology;  Laterality: Bilateral;  . CESAREAN SECTION  [phone]  . CHOLECYSTECTOMY  2001  . COLONOSCOPY WITH PROPOFOL N/A 04/07/2017   Dr. Lovena Neighbours: Normal, abnormal CT likely artifactual  . ENDOMETRIAL ABLATION    . ESOPHAGOGASTRODUODENOSCOPY  2002   [facility]: normal  . KNEE ARTHROSCOPY WITH LATERAL RELEASE Right 04/18/2018   Procedure: Right knee arthroscopy with chondroplasty and lateral release;  Surgeon: Yolonda Kida, MD;  Location: [facility];  Service: Orthopedics;  Laterality: Right;  60 mins  . SUPRACERVICAL ABDOMINAL HYSTERECTOMY N/A 06/21/2017   Procedure: HYSTERECTOMY SUPRACERVICAL ABDOMINAL;  Surgeon: Tilda Burrow, MD;  Location: [facility];  Service: Gynecology;  Laterality: N/A;  . TUBAL LIGATION       OB History    Gravida  3   Para  3   Term  3   Preterm      AB      Living  3     SAB      IAB      Ectopic      Multiple      Live Births  3           Family History  Problem Relation Age of Onset  . Hypertension Mother   . Other Mother        abnormal cells; had hyst  . Obesity Daughter   . Diabetes Daughter 26       youngest  . Obesity Son   . Cancer Maternal Grandmother   . Osteoporosis Maternal Grandmother   . Cancer Maternal Grandfather   . Obesity Daughter   . Diabetes Father   . Hypertension Father   . Alcohol abuse Father   . Colon cancer Neg Hx     Social History   Tobacco Use  . Smoking status: Former Smoker    Packs/day: 0.25    Years: 25.00    Pack years: 6.25    Types: Cigarettes    Quit date: 04/02/2019    Years since quitting: 1.7  . Smokeless tobacco: Never Used  Vaping Use  . Vaping Use: Never used  Substance Use Topics  . Alcohol use: No  . Drug use: No    Home Medications Prior to Admission medications   Medication Sig Start Date End Date Taking? Authorizing Provider   ALPRAZolam Prudy Feeler) 0.5 MG tablet Take 0.5 mg by mouth at bedtime as needed for sleep.  10/06/18  Yes [provider]  budesonide-formoterol (SYMBICORT) 160-4.5 MCG/ACT inhaler Inhale 2 puffs into the lungs 2 (two) times daily as needed (for flares).    Yes [provider]  cyanocobalamin (,VITAMIN B-12,) 1000 MCG/ML injection Inject 1,000 mcg into the skin every 30 (thirty) days.  02/17/18  Yes [provider]  escitalopram (LEXAPRO) 20 MG tablet Take 20 mg by mouth at bedtime.  10/31/17  Yes [provider]  Galcanezumab-gnlm (EMGALITY) 120 MG/ML SOAJ Inject 240 mg into the skin every 30 (thirty) days. 12/31/20  Yes Glean Salvo, NP  tiZANidine (ZANAFLEX) 2 MG tablet Take 1 tablet (2 mg total) by mouth at bedtime. 05/04/19  Yes Layne Benton, NP  Ubrogepant (UBRELVY) 100 MG TABS Take 100 mg by mouth as needed (take 1 tablet at onset of headache, may repeat 2 hours after initial dose, max is 200 mg/24 hours). 12/11/20  Yes Glean Salvo, NP  Vitamin D, Ergocalciferol, (DRISDOL) 50000 units CAPS capsule Take 50,000 Units by mouth every Sunday.  02/18/18  Yes [provider]  botulinum toxin Type A (BOTOX) 100 units SOLR injection Inject 155units IM into head and neck muscles every 3 months by the provider. Patient not taking: Reported on 01/12/2021 06/11/20   Glean Salvo, NP  Galcanezumab-gnlm [facility] 120 MG/ML SOAJ Inject 120 mg into the skin every 30 (thirty) days. 12/31/20   Glean Salvo, NP  pantoprazole (PROTONIX) 40 MG tablet Take 1 tablet (40 mg total) by mouth 2 (two) times daily before a meal. Patient not taking: Reported on 01/12/2021 10/20/20   Gelene Mink, NP    Allergies    Bupropion, Duloxetine hcl, Gabapentin, Meloxicam, Pregabalin, Amitriptyline, Ciprofloxacin, Desvenlafaxine succinate er, Hydrocodone-acetaminophen, Milnacipran, Nortriptyline, Topiramate, Decadron [dexamethasone], Prednisone, Doxycycline, and Percocet  [oxycodone-acetaminophen]  Review of Systems   Review of Systems  Constitutional: Negative.   HENT: Negative.   Respiratory: Negative.   Cardiovascular: Negative.   Gastrointestinal: Negative.   Genitourinary: Negative.   Musculoskeletal: Negative.   Skin: Negative.   Neurological: Positive for dizziness, light-headedness, numbness and headaches. Negative for facial asymmetry.  All other systems reviewed and are negative.   Physical Exam Updated Vital Signs BP 140/81 (BP Location: Left Arm)   Pulse 62   Temp 97.8 F (36.6 C) (Oral)   Resp 14   Ht  (1.626 m)   Wt 90.7 kg   SpO2 98%   BMI 34.33 kg/m   Physical Exam Physical Exam  Constitutional: Pt is oriented to person, place, and time. Pt appears well-developed and well-nourished. No distress.  HENT:  Head: Normocephalic and atraumatic.  Mouth/Throat: Oropharynx is clear and moist.  Eyes: Conjunctivae and EOM are normal. Pupils are equal, round, and reactive to light. No scleral icterus.  No horizontal, vertical or rotational nystagmus  Neck: Normal range of motion. Neck supple.  Full active and passive ROM without pain No midline or paraspinal tenderness No nuchal rigidity or meningeal signs  Cardiovascular: Normal rate, regular rhythm and intact distal pulses.   Pulmonary/Chest: Effort normal and breath sounds normal. No respiratory distress. Pt has no wheezes. No rales.  Abdominal: Soft. Bowel sounds are normal. There is no tenderness. There is no rebound and no guarding.  Musculoskeletal: Normal range of motion.  Lymphadenopathy:    No cervical adenopathy.  Neurological: Pt. is alert and oriented to person, place, and time. He has normal reflexes. No cranial nerve deficit.  Exhibits normal muscle tone. Coordination normal.  Mental Status:  Alert, oriented, thought content appropriate. Speech fluent without evidence of aphasia. Able to follow 2 step commands without difficulty.  Alert to person, place,  time. Cranial Nerves:  II:  Peripheral visual fields grossly normal, pupils equal, round, reactive to light III,IV, VI: ptosis not present, extra-ocular motions intact bilaterally  V,VII: smile symmetric, States sensation "feels different to right face." VIII: hearing grossly normal bilaterally  IX,X: midline uvula rise  XI: bilateral shoulder shrug  equal and strong XII: midline tongue extension  Motor:  5/5 in upper and lower extremities bilaterally including strong and equal grip strength and dorsiflexion/plantar flexion Sensory: Pinprick and light touch normal in all extremities.  Deep Tendon Reflexes: 2+ and symmetric  Cerebellar: normal finger-to-nose with bilateral upper extremities Gait: normal gait and balance CV: distal pulses palpable throughout   Skin: Skin is warm and dry. No rash noted. Pt is not diaphoretic.  Psychiatric: Pt has a normal mood and affect. Behavior is normal. Judgment and thought content normal.  Nursing note and vitals reviewed. ED Results / Procedures / Treatments   Labs (all labs ordered are listed, but only abnormal results are displayed) Labs Reviewed  BASIC METABOLIC PANEL - Abnormal; Notable for the following components:      Result Value   Calcium 8.7 (*)    All other components within normal limits  CBC WITH DIFFERENTIAL/PLATELET  PROTIME-INR  APTT    EKG EKG Interpretation  Date/Time:  Monday January 12 2021 13:57:37 EDT Ventricular Rate:  58 PR Interval:    QRS Duration: 82 QT Interval:  475 QTC Calculation: 467 R Axis:   57 Text Interpretation: Sinus rhythm Confirmed by Vanetta MuldersZackowski, Scott [phone]([id number]) on 01/12/2021 2:00:15 PM   Radiology CT Angio Head W or Wo Contrast  Result Date: 01/12/2021 CLINICAL DATA:  Headache, dizziness and right facial droop. Negative MRI. EXAM: CT ANGIOGRAPHY HEAD AND NECK TECHNIQUE: Multidetector CT imaging of the head and neck was performed using the standard protocol during bolus administration of intravenous  contrast. Multiplanar CT image reconstructions and MIPs were obtained to evaluate the vascular anatomy. Carotid stenosis measurements (when applicable) are obtained utilizing NASCET criteria, using the distal internal carotid diameter as the denominator. CONTRAST:  75mL OMNIPAQUE IOHEXOL 350 MG/ML SOLN COMPARISON:  MRI same day.  Previous CT study 05/03/2019. FINDINGS: CT HEAD FINDINGS Brain: The brain shows a normal appearance without evidence of malformation, atrophy, old or acute small or large vessel infarction, mass lesion, hemorrhage, hydrocephalus or extra-axial collection. Vascular: No hyperdense vessel. No evidence of atherosclerotic calcification. Skull: Normal.  No traumatic finding.  No focal bone lesion. Sinuses/Orbits: Sinuses are clear. Orbits appear normal. Mastoids are clear. Other: None significant CTA NECK FINDINGS Aortic arch: Normal. No atherosclerotic change or dissection. Branching pattern is normal. Right carotid system: Normal. No atherosclerotic disease. Carotid bifurcation is normal. Cervical ICA is tortuous but otherwise normal. Left carotid system: Common carotid artery widely patent to the bifurcation. Carotid bifurcation is normal. Cervical ICA is tortuous but otherwise normal. Vertebral arteries: Both vertebral artery origins are widely patent. Both vertebral arteries are normal through the cervical region to the foramen magnum. Skeleton: Normal Other neck: No mass or lymphadenopathy. Upper chest: Normal Review of the MIP images confirms the above findings CTA HEAD FINDINGS Anterior circulation: Both internal carotid arteries are patent through the skull base and siphon regions. The anterior and middle cerebral vessels are normal without proximal stenosis, aneurysm or vascular malformation. No large or medium vessel occlusion. Posterior circulation: Both vertebral arteries widely patent to the basilar. No basilar stenosis. Posterior circulation branch vessels are normal. Venous  sinuses: Patent and normal. Anatomic variants: None significant. Review of the MIP images confirms the above findings IMPRESSION: 1. Normal head CT. 2. Normal CT angiography of the neck vessels. 3. Normal intracranial CT angiography. Electronically Signed   By: Paulina FusiMark  Shogry M.D.   On: 01/12/2021 15:06   CT Angio Neck W and/or Wo Contrast  Result Date: 01/12/2021 CLINICAL DATA:  Headache, dizziness and right facial droop. Negative MRI. EXAM: CT ANGIOGRAPHY HEAD AND NECK TECHNIQUE: Multidetector CT imaging of the head and neck was performed using the standard protocol during bolus administration of intravenous contrast. Multiplanar CT image reconstructions and MIPs were obtained to evaluate the vascular anatomy. Carotid stenosis measurements (when applicable) are obtained utilizing NASCET criteria, using the distal internal carotid diameter as the denominator. CONTRAST:  75mL OMNIPAQUE IOHEXOL 350 MG/ML SOLN COMPARISON:  MRI same day.  Previous CT study 05/03/2019. FINDINGS: CT HEAD FINDINGS Brain: The brain shows a normal appearance without evidence of malformation, atrophy, old or acute small or large vessel infarction, mass lesion, hemorrhage, hydrocephalus or extra-axial collection. Vascular: No hyperdense vessel. No evidence of atherosclerotic calcification. Skull: Normal.  No traumatic finding.  No focal bone lesion. Sinuses/Orbits: Sinuses are clear. Orbits appear normal. Mastoids are clear. Other: None significant CTA NECK FINDINGS Aortic arch: Normal. No atherosclerotic change or dissection. Branching pattern is normal. Right carotid system: Normal. No atherosclerotic disease. Carotid bifurcation is normal. Cervical ICA is tortuous but otherwise normal. Left carotid system: Common carotid artery widely patent to the bifurcation. Carotid bifurcation is normal. Cervical ICA is tortuous but otherwise normal. Vertebral arteries: Both vertebral artery origins are widely patent. Both vertebral arteries are  normal through the cervical region to the foramen magnum. Skeleton: Normal Other neck: No mass or lymphadenopathy. Upper chest: Normal Review of the MIP images confirms the above findings CTA HEAD FINDINGS Anterior circulation: Both internal carotid arteries are patent through the skull base and siphon regions. The anterior and middle cerebral vessels are normal without proximal stenosis, aneurysm or vascular malformation. No large or medium vessel occlusion. Posterior circulation: Both vertebral arteries widely patent to the basilar. No basilar stenosis. Posterior circulation branch vessels are normal. Venous sinuses: Patent and normal. Anatomic variants: None significant. Review of the MIP images confirms the above findings IMPRESSION: 1. Normal head CT. 2. Normal CT angiography of the neck vessels. 3. Normal intracranial CT angiography. Electronically Signed   By: Paulina Fusi M.D.   On: 01/12/2021 15:06   MR BRAIN WO CONTRAST  Result Date: 01/12/2021 CLINICAL DATA:  Headache, dizziness and right facial droop beginning 5 days ago. EXAM: MRI HEAD WITHOUT CONTRAST TECHNIQUE: Multiplanar, multiecho pulse sequences of the brain and surrounding structures were obtained without intravenous contrast. COMPARISON:  MRI 08/01/2020 FINDINGS: Brain: The brain has a normal appearance without evidence of malformation, atrophy, old or acute small or large vessel infarction, mass lesion, hemorrhage, hydrocephalus or extra-axial collection. Vascular: Major vessels at the base of the brain show flow. Venous sinuses appear patent. Skull and upper cervical spine: Normal. Sinuses/Orbits: Clear/normal. Other: None significant. IMPRESSION: Normal examination. No abnormality seen to explain the presenting symptoms. Electronically Signed   By: Paulina Fusi M.D.   On: 01/12/2021 14:40    Procedures Procedures   Medications Ordered in ED Medications  sodium chloride 0.9 % bolus 1,000 mL (1,000 mLs Intravenous New Bag/Given  01/12/21 1442)  prochlorperazine (COMPAZINE) injection 10 mg (10 mg Intravenous Given 01/12/21 1440)  diphenhydrAMINE (BENADRYL) injection 12.5 mg (12.5 mg Intravenous Given 01/12/21 1442)  iohexol (OMNIPAQUE) 350 MG/ML injection 75 mL (75 mLs Intravenous Contrast Given 01/12/21 1502)    ED Course  I have reviewed the triage vital signs and the nursing notes.  Pertinent labs & imaging results that were available during my care of the patient were reviewed by me and considered in my medical decision making (see chart for details).  40 year old here with  complicated migraines, prior TIA who presents for evaluation of headache which began 6 days ago and possible right-sided facial tingling which she is unsure when this started.  Unfortunate has history of chronic migraines followed by neurology.  Took her breakthrough medication last week however none since.  She does note that she felt like her vision a few days ago is "all over the place" however denies any current diplopia, visual field cuts.  No obvious facial droop, equal handgrip bilaterally.  She does states she feels her sensation to the right side of her face is different than the left.  Her tongue is midline.  She has negative Romberg, pronator drift.  We will plan on labs, imaging and treat for migraine.  Labs and imaging personally reviewed and interpreted:  MR brain WO acute process CTA head, CTA neck WO acute abnormality EKG WO ischemic changes CBC without leukocytosis Metabolic panel with calcium 8.7 INR 1.1  Patient reassessed.  She is sitting on side of bed with legs dangling, texting on phone.  Feels like her headache has significantly improved with migraine cocktail.  She has a nonfocal neuro exam without deficits.  Feel patient symptoms like related to complicated migraine which she has had previously.  She has normal mentation.  She would like to follow-up with her neurologist.  I feel this is reasonable. I have low suspicion for  sepsis, acute electrolyte abnormality, mass, CVA, ICH, MS, dissection as cause of her symptoms.  The patient has been appropriately medically screened and/or stabilized in the ED. I have low suspicion for any other emergent medical condition which would require further screening, evaluation or treatment in the ED or require inpatient management.  Patient is hemodynamically stable and in no acute distress.  Patient able to ambulate in department prior to ED.  Evaluation does not show acute pathology that would require ongoing or additional emergent interventions while in the emergency department or further inpatient treatment.  I have discussed the diagnosis with the patient and answered all questions.  Pain is been managed while in the emergency department and patient has no further complaints prior to discharge.  Patient is comfortable with plan discussed in room and is stable for discharge at this time.  I have discussed strict return precautions for returning to the emergency department.  Patient was encouraged to follow-up with PCP/specialist refer to at discharge.  Discussed with attending, Dr. Gustavus Messing who is agreeable for above treatment, plan and disposition.    MDM Rules/Calculators/A&P                           Final Clinical Impression(s) / ED Diagnoses Final diagnoses:  Other migraine without status migrainosus, not intractable    Rx / DC Orders ED Discharge Orders    None       Henderly, Britni A, PA-C 01/12/21 1526    Vanetta Mulders, MD 01/20/21 [phone]

## 2021-01-12 NOTE — Discharge Instructions (Addendum)
Follow-up with neurology.  Return for any worsening symptoms.

## 2021-01-13 ENCOUNTER — Telehealth: Payer: Self-pay | Admitting: Neurology

## 2021-01-13 NOTE — Telephone Encounter (Signed)
Received a note that Mary Parker was recently in the ER with typical presentation of right-sided facial numbness.  MRI of the brain was normal  CT angio head and neck was normal.  CBC, BMP were unremarkable.  Please make sure she was able to pick up Emgality, and start the medication.  She has Ubrelvy to take.

## 2021-01-13 NOTE — Telephone Encounter (Signed)
I called pt and LMVM for her that received notice went to ED, testing good.  Wanted to make sure taking emgality,  To call back as needed.

## 2021-01-14 ENCOUNTER — Encounter: Payer: Self-pay | Admitting: Neurology

## 2021-01-22 ENCOUNTER — Ambulatory Visit: Payer: Medicaid Other | Admitting: Gastroenterology

## 2021-02-06 ENCOUNTER — Other Ambulatory Visit: Payer: Self-pay

## 2021-02-06 ENCOUNTER — Ambulatory Visit
Admission: EM | Admit: 2021-02-06 | Discharge: 2021-02-06 | Disposition: A | Discharge status: Home / Self Care | Payer: Medicaid Other | Attending: Internal Medicine | Admitting: Internal Medicine

## 2021-02-06 ENCOUNTER — Encounter: Payer: Self-pay | Admitting: Emergency Medicine

## 2021-02-06 DIAGNOSIS — B085 Enteroviral vesicular pharyngitis: Secondary | ICD-10-CM | POA: Diagnosis not present

## 2021-02-06 MED ORDER — LIDOCAINE HCL URETHRAL/MUCOSAL 2 % EX GEL: CUTANEOUS | 0 refills | Status: DC

## 2021-02-06 MED ORDER — VALACYCLOVIR HCL 1 G PO TABS: 1000.0000 mg | ORAL_TABLET | Freq: Three times a day (TID) | ORAL | 0 refills | Status: AC

## 2021-02-06 NOTE — Discharge Instructions (Addendum)
Check with with Functional/ Integrative medicine doctors to find the root cause of what is going on. [facility] has a team and another group called or : [facility] Facebook [phone]  Health & medical [address], [place]  [phone]

## 2021-02-06 NOTE — ED Triage Notes (Signed)
Swollen tongue today states she was taking gabapentin and had a reaction a few weeks ago.

## 2021-02-06 NOTE — ED Provider Notes (Signed)
[facility]    CSN: [id number] Arrival date & time: 02/06/21  1457      History   Chief Complaint No chief complaint on file.   HPI Mary Parker is a 40 y.o. female who presents with L tongue swelling and pain  X 2 days. Has been on new med x 1 month.  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 Has not had a fever or cold symptoms.     Past Medical History:  Diagnosis Date  . Anxiety   . Arthritis    both knees  . Body aches 12/11/2014  . Chronic insomnia   . Chronic low back pain 05/29/2015  . Common migraine 12/19/2014  . Depression 12/11/2014  . Fibrocystic disease of both breasts   . Fibromyalgia 12/19/2014  . GERD (gastroesophageal reflux disease)   . History of hiatal hernia   . IBS (irritable bowel syndrome)   . Knee pain, right   . Migraines   . Rebound headache 12/19/2014  . Right ovarian cyst 04/25/2017   Complex, check CA 125 and repeat US in 6 weeks   . Sciatica of left side   . Seizures (HCC)    remote past, felt it was related to Chantix   . Sleep apnea   . Vitamin D deficiency   . Wears glasses     Patient Active Problem List   Diagnosis Date Noted  . Abdominal pain 12/22/2020  . Nausea without vomiting 12/22/2020  . Early satiety 12/22/2020  . Loss of weight 12/22/2020  . RUQ abdominal pain 10/15/2020  . OSA (obstructive sleep apnea) 09/08/2020  . Sleep walking and eating 07/30/2020  . Excessive daytime sleepiness 07/30/2020  . Class 3 severe obesity due to excess calories without serious comorbidity with body mass index (BMI) of 40.0 to 44.9 in adult (HCC) 07/30/2020  . Psychophysiological insomnia 07/30/2020  . Loud snoring 07/30/2020  . Chronic migraine without aura, with intractable migraine, so stated, with status migrainosus 07/12/2019  .  Cigarette smoker 05/04/2019  . Obesity, Class II, BMI 35-39.9 05/04/2019  . Stroke-like episode (HCC) s/p tPA 05/03/2019  . Acute gastroenteritis 03/29/2019  . 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 S/P abdominal supracervical subtotal hysterectomy 06/21/2017  . Pelvic adhesions 06/09/2017  . History of ovarian cyst 06/09/2017  . Right ovarian cyst 04/25/2017  . Encounter for gynecological examination with Papanicolaou smear of cervix 04/19/2017  . Rectal bleeding 04/02/2017  . LLQ pain 04/01/2017  . Constipation 04/01/2017  . Concussion with loss of consciousness 11/16/2016  . Anxiety 10/14/2016  . Chronic low back pain 05/29/2015  . Complicated migraine 12/19/2014  . Fibromyalgia 12/19/2014  . Rebound headache 12/19/2014  . Depression 12/11/2014  . Body aches 12/11/2014    Past Surgical History:  Procedure Laterality Date  . BILATERAL SALPINGECTOMY Bilateral 06/21/2017   Procedure: BILATERAL SALPINGECTOMY;  Surgeon: Tilda Burrow, MD;  Location: [facility];  Service: Gynecology;  Laterality: Bilateral;  . CESAREAN SECTION  [id number]  . CHOLECYSTECTOMY  2001  . COLONOSCOPY WITH PROPOFOL N/A 04/07/2017   Dr. Lovena Neighbours: Normal, abnormal CT likely artifactual  . ENDOMETRIAL ABLATION    . ESOPHAGOGASTRODUODENOSCOPY  2002   [facility]: normal  . KNEE ARTHROSCOPY WITH LATERAL RELEASE Right 04/18/2018   Procedure: Right knee arthroscopy with chondroplasty and lateral  release;  Surgeon: Yolonda Kida, MD;  Location: [facility];  Service: Orthopedics;  Laterality: Right;  60 mins  . SUPRACERVICAL ABDOMINAL HYSTERECTOMY N/A 06/21/2017   Procedure: HYSTERECTOMY SUPRACERVICAL ABDOMINAL;  Surgeon: Tilda Burrow, MD;  Location: [facility];  Service: Gynecology;  Laterality: N/A;  . TUBAL LIGATION      OB History    Gravida  3   Para  3   Term  3   Preterm      AB      Living  3     SAB      IAB      Ectopic      Multiple      Live Births  3             Home Medications    Prior to Admission medications   Medication Sig Start Date End Date Taking? Authorizing Provider  lidocaine (XYLOCAINE) 2 % jelly Apply to tongue with a q-tip for pain 02/06/21  Yes Rodriguez-Southworth, Nettie Elm, PA-C  valACYclovir (VALTREX) 1000 MG tablet Take 1 tablet (1,000 mg total) by mouth 3 (three) times daily for 7 days. 02/06/21 02/13/21 Yes Rodriguez-Southworth, Nettie Elm, PA-C  ALPRAZolam Prudy Feeler) 0.5 MG tablet Take 0.5 mg by mouth at bedtime as needed for sleep.  10/06/18   [provider]  botulinum toxin Type A (BOTOX) 100 units SOLR injection Inject 155units IM into head and neck muscles every 3 months by the provider. Patient not taking: Reported on 01/12/2021 06/11/20   Glean Salvo, NP  budesonide-formoterol [facility]) 160-4.5 MCG/ACT inhaler Inhale 2 puffs into the lungs 2 (two) times daily as needed (for flares).     [provider]  cyanocobalamin (,VITAMIN B-12,) 1000 MCG/ML injection Inject 1,000 mcg into the skin every 30 (thirty) days.  02/17/18   [provider]  escitalopram (LEXAPRO) 20 MG tablet Take 20 mg by mouth at bedtime.  10/31/17   [provider]  Galcanezumab-gnlm (EMGALITY) 120 MG/ML SOAJ Inject 240 mg into the skin every 30 (thirty) days. 12/31/20   Glean Salvo, NP  Galcanezumab-gnlm (EMGALITY) 120 MG/ML SOAJ Inject 120 mg into the skin every 30 (thirty) days. 12/31/20   Glean Salvo, NP  pantoprazole (PROTONIX) 40 MG tablet Take 1 tablet (40 mg total) by mouth 2 (two) times daily before a meal. Patient not taking: Reported on 01/12/2021 10/20/20   Gelene Mink, NP  tiZANidine (ZANAFLEX) 2 MG tablet Take 1 tablet (2 mg total) by mouth at bedtime. 05/04/19   Layne Benton, NP  Ubrogepant (UBRELVY) 100 MG TABS Take 100 mg by mouth as needed (take 1 tablet at onset of headache, may repeat 2 hours after initial dose, max is 200 mg/24 hours). 12/11/20   Glean Salvo, NP  Vitamin D, Ergocalciferol,  (DRISDOL) 50000 units CAPS capsule Take 50,000 Units by mouth every Sunday.  02/18/18   [provider]    Family History Family History  Problem Relation Age of Onset  . Hypertension Mother   . Other Mother        abnormal cells; had hyst  . Obesity Daughter   . Diabetes Daughter 38       youngest  . Obesity Son   . Cancer Maternal Grandmother   . Osteoporosis Maternal Grandmother   . Cancer Maternal Grandfather   . Obesity Daughter   . Diabetes Father   . Hypertension Father   . Alcohol abuse Father   .  Colon cancer Neg Hx     Social History Social History   Tobacco Use  . Smoking status: Former Smoker    Packs/day: 0.25    Years: 25.00    Pack years: 6.25    Types: Cigarettes    Quit date: 04/02/2019    Years since quitting: 1.8  . Smokeless tobacco: Never Used  Vaping Use  . Vaping Use: Never used  Substance Use Topics  . Alcohol use: No  . Drug use: No     Allergies   Bupropion, Duloxetine hcl, Gabapentin, Meloxicam, Pregabalin, Amitriptyline, Ciprofloxacin, Desvenlafaxine succinate er, Hydrocodone-acetaminophen, Milnacipran, Nortriptyline, Topiramate, Decadron [dexamethasone], Prednisone, Doxycycline, and Percocet [oxycodone-acetaminophen]   Review of Systems Review of Systems  Constitutional: Positive for appetite change. Negative for fever.  HENT: Negative for congestion, dental problem, ear discharge, ear pain, mouth sores, rhinorrhea, sinus pain, sore throat and trouble swallowing.   Eyes: Negative for itching.  Respiratory: Negative for cough.   Skin: Negative for rash.  Hematological: Negative for adenopathy.     Physical Exam Triage Vital Signs ED Triage Vitals  Enc Vitals Group     BP 02/06/21 1506 120/84     Pulse Rate 02/06/21 1506 80     Resp 02/06/21 1506 18     Temp 02/06/21 1506 98.9 F (37.2 C)     Temp Source 02/06/21 1506 Oral     SpO2 02/06/21 1506 97 %     Weight --      Height --      Head Circumference --       Peak Flow --      Pain Score 02/06/21 1510 10     Pain Loc --      Pain Edu? --      Excl. in GC? --    No data found.  Updated Vital Signs BP 120/84 (BP Location: Right Arm)   Pulse 80   Temp 98.9 F (37.2 C) (Oral)   Resp 18   SpO2 97%   Visual Acuity Right Eye Distance:   Left Eye Distance:   Bilateral Distance:    Right Eye Near:   Left Eye Near:    Bilateral Near:     Physical Exam Vitals and nursing note reviewed.  Constitutional:      Comments: Talks with sound of swollen tongue  HENT:     Head: Normocephalic.     Right Ear: External ear normal.     Left Ear: External ear normal.     Mouth/Throat:     Mouth: Mucous membranes are moist.     Comments: TONGUE- mild swelling on the L noted, and has erythematous papules on L tongue and end of R side, but only the L side of her tongue is tender to touch it. She does not have oral lesions. Tongue movement is normal.  Eyes:     General: Scleral icterus present.     Conjunctiva/sclera: Conjunctivae normal.  Pulmonary:     Effort: Pulmonary effort is normal.  Musculoskeletal:     Cervical back: Neck supple.  Lymphadenopathy:     Cervical: No cervical adenopathy.  Skin:    General: Skin is warm and dry.     Findings: No rash.  Neurological:     Mental Status: She is alert and oriented to person, place, and time.     Gait: Gait normal.     Comments: R face looks paralyzed  Psychiatric:        Behavior: Behavior normal.  Thought Content: Thought content normal.        Judgment: Judgment normal.     Comments: tearful    UC Treatments / Results  Labs (all labs ordered are listed, but only abnormal results are displayed) Labs Reviewed - No data to display  EKG   Radiology No results found.  Procedures Procedures (including critical care time)  Medications Ordered in UC Medications - No data to display  Initial Impression / Assessment and Plan / UC Course  I have reviewed the triage vital signs  and the nursing notes. 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 Needs to Fu with her neurologist, but will also look for answers through Integrative/ Functional Med clinic.    Final Clinical Impressions(s) / UC Diagnoses   Final diagnoses:  Herpangina     Discharge Instructions     Check with with Functional/ Integrative medicine doctors to find the root cause of what is going on. [facility] has a team and another group called or : [facility] Facebook [phone]  [facility] [address], [place]  [phone]    ED Prescriptions    Medication Sig Dispense Auth. Provider   valACYclovir (VALTREX) 1000 MG tablet Take 1 tablet (1,000 mg total) by mouth 3 (three) times daily for 7 days. 21 tablet Rodriguez-Southworth, Breck [place], PA-C   lidocaine (XYLOCAINE) 2 % jelly Apply to tongue with a q-tip for pain 30 mL Rodriguez-Southworth, Nettie ElmSylvia, PA-C     PDMP not reviewed this encounter.   Garey HamRodriguez-Southworth, Miabella Shannahan, Cordelia Poche-C 02/06/21 2106

## 2021-02-10 NOTE — Telephone Encounter (Signed)
Patient has not called the office to schedule a Botox appointment after cancelling her appointment in February. I called patient and LVM requesting she call us back if she would like to make an appointment.

## 2021-03-03 ENCOUNTER — Ambulatory Visit: Payer: Medicaid Other | Admitting: Gastroenterology

## 2021-03-03 ENCOUNTER — Encounter: Payer: Self-pay | Admitting: Internal Medicine

## 2021-03-25 ENCOUNTER — Ambulatory Visit: Payer: Medicaid Other | Admitting: Neurology

## 2021-04-08 ENCOUNTER — Ambulatory Visit: Payer: Medicaid Other | Admitting: Neurology

## 2021-07-13 ENCOUNTER — Other Ambulatory Visit: Payer: Self-pay | Admitting: Gastroenterology

## 2021-07-21 ENCOUNTER — Other Ambulatory Visit: Payer: Self-pay

## 2021-07-21 MED ORDER — ONDANSETRON 4 MG PO TBDP: ORAL_TABLET | ORAL | 6 refills | Status: DC

## 2021-08-06 ENCOUNTER — Other Ambulatory Visit: Payer: Self-pay | Admitting: Family Medicine

## 2021-08-17 ENCOUNTER — Other Ambulatory Visit: Payer: Self-pay | Admitting: Family Medicine

## 2021-08-17 DIAGNOSIS — M5432 Sciatica, left side: Secondary | ICD-10-CM

## 2021-08-19 ENCOUNTER — Ambulatory Visit (HOSPITAL_COMMUNITY): Payer: BC Managed Care – PPO | Attending: Family Medicine | Admitting: Physical Therapy

## 2021-08-19 ENCOUNTER — Encounter (HOSPITAL_COMMUNITY): Payer: Self-pay | Admitting: Physical Therapy

## 2021-08-19 ENCOUNTER — Other Ambulatory Visit: Payer: Self-pay

## 2021-08-19 DIAGNOSIS — N3946 Mixed incontinence: Secondary | ICD-10-CM | POA: Diagnosis not present

## 2021-08-19 NOTE — Therapy (Signed)
[facility] [address] [place], [place], [address] Phone: [phone]   Fax:  [phone]  Physical Therapy Evaluation  Patient Details  Name: Mary Parker MRN: [id number] Date of Birth: December 23, 1980 Referring Provider (PT): Leticia Penna   Encounter Date: 08/19/2021   PT End of Session - 08/19/21 1349     Visit Number 1    Number of Visits 4    Date for PT Re-Evaluation 09/18/21    Authorization Type UHC medicaid    Progress Note Due on Visit 4    PT Start Time 1315    PT Stop Time 1345    PT Time Calculation (min) 30 min    Activity Tolerance Patient tolerated treatment well    Behavior During Therapy [facility] for tasks assessed/performed             Past Medical History:  Diagnosis Date   Anxiety    Arthritis    both knees   Body aches 12/11/2014   Chronic insomnia    Chronic low back pain 05/29/2015   Common migraine 12/19/2014   Depression 12/11/2014   Fibrocystic disease of both breasts    Fibromyalgia 12/19/2014   GERD (gastroesophageal reflux disease)    History of hiatal hernia    IBS (irritable bowel syndrome)    Knee pain, right    Migraines    Rebound headache 12/19/2014   Right ovarian cyst 04/25/2017   Complex, check CA 125 and repeat US in 6 weeks    Sciatica of left side    Seizures (HCC)    remote past, felt it was related to Chantix    Sleep apnea    Vitamin D deficiency    Wears glasses     Past Surgical History:  Procedure Laterality Date   BILATERAL SALPINGECTOMY Bilateral 06/21/2017   Procedure: BILATERAL SALPINGECTOMY;  Surgeon: Tilda Burrow, MD;  Location: [facility];  Service: Gynecology;  Laterality: Bilateral;   CESAREAN SECTION  [id number]   CHOLECYSTECTOMY  2001   COLONOSCOPY WITH PROPOFOL N/A 04/07/2017   Dr. Lovena Neighbours: Normal, abnormal CT likely artifactual   ENDOMETRIAL ABLATION     ESOPHAGOGASTRODUODENOSCOPY  2002   [facility]: normal   KNEE ARTHROSCOPY WITH LATERAL RELEASE Right  04/18/2018   Procedure: Right knee arthroscopy with chondroplasty and lateral release;  Surgeon: Yolonda Kida, MD;  Location: [facility];  Service: Orthopedics;  Laterality: Right;  60 mins   SUPRACERVICAL ABDOMINAL HYSTERECTOMY N/A 06/21/2017   Procedure: HYSTERECTOMY SUPRACERVICAL ABDOMINAL;  Surgeon: Tilda Burrow, MD;  Location: [facility];  Service: Gynecology;  Laterality: N/A;   TUBAL LIGATION      There were no vitals filed for this visit.    Subjective Assessment - 08/19/21 1321     Subjective [name] states that she has had three children and she is having difficulty holding her urine.  On more than one occasion she has not been able to get to the restroom prior to her body starting to urinate.    Currently in Pain? No/denies                [facility] PT Assessment - 08/19/21 0001       Assessment   Medical Diagnosis Overactive bladder    Referring Provider (PT) Leticia Penna    Onset Date/Surgical Date 08/01/21    Next MD Visit not scheduled    Prior Therapy none      Precautions   Precautions  None      Restrictions   Weight Bearing Restrictions No      Balance Screen   Has the patient fallen in the past 6 months No    Has the patient had a decrease in activity level because of a fear of falling?  No    Is the patient reluctant to leave their home because of a fear of falling?  No      Prior Function   Level of Independence Independent      Cognition   Overall Cognitive Status Within Functional Limits for tasks assessed      ROM / Strength   AROM / PROM / Strength --   vaginal 2/5; lower ab 3-                       Objective measurements completed on examination: See above findings.     Pelvic Floor Special Questions - 08/19/21 0001     Prior Pregnancies Yes    Number of Pregnancies 3    Number of C-Sections 3    Urinary Leakage Yes    How often 1-2 x a day    Pad use --   panty liner if she goes out    Activities that cause leaking With strong urge;Coughing;Sneezing;Laughing;Exercising    Urinary urgency Yes    Urinary frequency almost all the time    Caffeine beverages 2              [facility] Adult PT Treatment/Exercise - 08/19/21 0001       Exercises   Exercises Lumbar      Lumbar Exercises: Seated   Other Seated Lumbar Exercises Kegal 5" rest 10"x 7; fast twitch 2" rest x 4" x 5; transverse ab 5" x 10                     PT Education - 08/19/21 1349     Education Details HEP    Person(s) Educated Patient    Methods Explanation    Comprehension Verbalized understanding              PT Short Term Goals - 08/19/21 1358       PT SHORT TERM GOAL #1   Title Pt to be I in HEP to improve pelvic and core strength    Time 2    Period Weeks    Status New               PT Long Term Goals - 08/19/21 1358       PT LONG TERM GOAL #1   Title PT to be completing an advance HEP to improve pelvic mm strength so that pt does not feel that she needs to use a liner when going out    Time 4    Period Weeks    Status New      PT LONG TERM GOAL #2   Title PT  to states that she does not have to use the restroom to urinate any sooner than every three hours.    Time 4    Period Weeks    Status New                    Plan - 08/19/21 1350     Clinical Impression Statement Ms. Livolsi is a 40 yo female who has been experiencing urinary incontinence for the past year.  It has gotten to the point  where she is wearing liners when she goes out to prevent an accident.  She has been  referred to skilled therapy.  Evaluation demonstrates decreased strength and endurance of the pelvic mm.    Examination-Activity Limitations Toileting;Hygiene/Grooming;Dressing    Examination-Participation Restrictions Other    Stability/Clinical Decision Making Stable/Uncomplicated    Clinical Decision Making Low    Rehab Potential Good    PT Frequency 1x / week    PT  Duration 4 weeks    PT Treatment/Interventions Patient/family education;Manual techniques;Therapeutic exercise    PT Next Visit Plan increase kegal long holds, increase quantity of kegal quick flicks, beging ab set in all four postition as well as supine, begin heel slides and hip abduciton.    PT Home Exercise Plan long and short hold kegals, transverse abdominal contraction.             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     Problem List Patient Active Problem List   Diagnosis Date Noted   Abdominal pain 12/22/2020   Nausea without vomiting 12/22/2020   Early satiety 12/22/2020   Loss of weight 12/22/2020   RUQ abdominal pain 10/15/2020   OSA (obstructive sleep apnea) 09/08/2020   Sleep walking and eating 07/30/2020   Excessive daytime sleepiness 07/30/2020   Class 3 severe obesity due to excess calories without serious comorbidity with body mass index (BMI) of 40.0 to 44.9 in adult (HCC) 07/30/2020   Psychophysiological insomnia 07/30/2020   Loud snoring 07/30/2020   Chronic migraine without aura, with intractable migraine, so stated, with status migrainosus 07/12/2019   Cigarette smoker 05/04/2019   Obesity, Class II, BMI 35-39.9 05/04/2019   Stroke-like episode (HCC) s/p tPA 05/03/2019   Acute gastroenteritis 03/29/2019   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   S/P abdominal supracervical subtotal hysterectomy 06/21/2017   Pelvic adhesions 06/09/2017   History of ovarian cyst 06/09/2017   Right ovarian cyst 04/25/2017   Encounter for gynecological examination with Papanicolaou smear of cervix 04/19/2017   Rectal bleeding 04/02/2017   LLQ pain 04/01/2017   Constipation 04/01/2017   Concussion with loss of consciousness 11/16/2016   Anxiety 10/14/2016   Chronic low back pain 05/29/2015   Complicated  migraine 12/19/2014   Fibromyalgia 12/19/2014   Rebound headache 12/19/2014   Depression 12/11/2014   Body aches 12/11/2014   Virgina Organ, PT CLT [phone]  08/19/2021, 2:02 PM  [facility] [address] [place], [place], [address] Phone: [phone]   Fax:  [phone]  Name: KARILYN WIND MRN: [id number] Date of Birth: 12/03/1980

## 2021-08-20 ENCOUNTER — Ambulatory Visit
Admission: RE | Admit: 2021-08-20 | Discharge: 2021-08-20 | Disposition: A | Discharge status: Home / Self Care | Payer: Medicaid Other | Source: Ambulatory Visit | Attending: Family Medicine | Admitting: Family Medicine

## 2021-08-20 DIAGNOSIS — M5432 Sciatica, left side: Secondary | ICD-10-CM

## 2021-08-20 MED ORDER — IOPAMIDOL (ISOVUE-M 200) INJECTION 41%
1.0000 mL | Freq: Once | INTRAMUSCULAR | Status: AC
  Administered 2021-08-20: 1 mL via INTRA_ARTICULAR

## 2021-08-20 MED ORDER — METHYLPREDNISOLONE ACETATE 40 MG/ML INJ SUSP (RADIOLOG
80.0000 mg | Freq: Once | INTRAMUSCULAR | Status: AC
  Administered 2021-08-20: 80 mg via INTRA_ARTICULAR

## 2021-08-20 NOTE — Discharge Instructions (Signed)

## 2021-08-24 ENCOUNTER — Ambulatory Visit (HOSPITAL_COMMUNITY): Payer: BC Managed Care – PPO | Admitting: Physical Therapy

## 2021-08-24 ENCOUNTER — Telehealth (HOSPITAL_COMMUNITY): Payer: Self-pay | Admitting: Physical Therapy

## 2021-08-24 NOTE — Telephone Encounter (Signed)
Called pt re missed appointment pt states she had a death at her employment and she totally forgot about her appointment.  Pt was reminded that her next appointment is next Friday.  Virgina Organ, PT CLT [phone]

## 2021-09-04 ENCOUNTER — Telehealth (HOSPITAL_COMMUNITY): Payer: Self-pay | Admitting: Physical Therapy

## 2021-09-04 ENCOUNTER — Encounter (HOSPITAL_COMMUNITY): Payer: Medicaid Other | Admitting: Physical Therapy

## 2021-09-04 NOTE — Telephone Encounter (Signed)
First no show:  Called pt no answer; reminded pt that her next appointment would be on 11/16.    Virgina Organ, PT CLT [phone]

## 2021-09-09 ENCOUNTER — Other Ambulatory Visit: Payer: Medicaid Other | Admitting: Adult Health

## 2021-09-16 ENCOUNTER — Ambulatory Visit (HOSPITAL_COMMUNITY): Payer: Medicaid Other | Attending: Family Medicine | Admitting: Physical Therapy

## 2021-09-16 ENCOUNTER — Telehealth (HOSPITAL_COMMUNITY): Payer: Self-pay | Admitting: Physical Therapy

## 2021-09-16 NOTE — Telephone Encounter (Signed)
First no show:   Called pt left message that she does not have a follow up visit.  If she desires more appointments she will need to call the department.   Virgina Organ, PT CLT [phone]

## 2021-10-13 ENCOUNTER — Ambulatory Visit: Payer: Medicaid Other | Admitting: Neurology

## 2021-10-13 ENCOUNTER — Other Ambulatory Visit: Payer: Self-pay | Admitting: Neurology

## 2021-10-13 ENCOUNTER — Encounter: Payer: Self-pay | Admitting: Neurology
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
## 2021-11-04 ENCOUNTER — Other Ambulatory Visit (HOSPITAL_COMMUNITY)
Admission: RE | Admit: 2021-11-04 | Discharge: 2021-11-04 | Disposition: A | Discharge status: Home / Self Care | Payer: BC Managed Care – PPO | Source: Ambulatory Visit | Attending: Adult Health | Admitting: Adult Health

## 2021-11-04 ENCOUNTER — Ambulatory Visit (INDEPENDENT_AMBULATORY_CARE_PROVIDER_SITE_OTHER): Payer: BC Managed Care – PPO | Admitting: Adult Health

## 2021-11-04 ENCOUNTER — Encounter: Payer: Self-pay | Admitting: Adult Health

## 2021-11-04 ENCOUNTER — Other Ambulatory Visit: Payer: Self-pay

## 2021-11-04 VITALS — BP 113/87 | HR 74 | Ht 64.0 in | Wt 199.0 lb

## 2021-11-04 DIAGNOSIS — Z1231 Encounter for screening mammogram for malignant neoplasm of breast: Secondary | ICD-10-CM | POA: Insufficient documentation

## 2021-11-04 DIAGNOSIS — Z113 Encounter for screening for infections with a predominantly sexual mode of transmission: Secondary | ICD-10-CM | POA: Insufficient documentation

## 2021-11-04 DIAGNOSIS — Z1211 Encounter for screening for malignant neoplasm of colon: Secondary | ICD-10-CM | POA: Diagnosis not present

## 2021-11-04 DIAGNOSIS — Z01419 Encounter for gynecological examination (general) (routine) without abnormal findings: Secondary | ICD-10-CM | POA: Diagnosis not present

## 2021-11-04 DIAGNOSIS — Z90711 Acquired absence of uterus with remaining cervical stump: Secondary | ICD-10-CM

## 2021-11-04 LAB — HEMOCCULT GUIAC POC 1CARD (OFFICE): Fecal Occult Blood, POC: NEGATIVE
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
## 2021-11-05 LAB — HEPATITIS C ANTIBODY: Hep C Virus Ab: <0.1 s/co ratio (ref 0.0–0.9)

## 2021-11-05 LAB — RPR: RPR Ser Ql: NONREACTIVE

## 2021-11-05 LAB — HIV ANTIBODY (ROUTINE TESTING W REFLEX): HIV Screen 4th Generation wRfx: NONREACTIVE

## 2021-11-05 LAB — HEPATITIS B SURFACE ANTIBODY, QUANTITATIVE: Hepatitis B Surf Ab Quant: >1000 m[IU]/mL (ref >9.9)

## 2021-11-09 ENCOUNTER — Encounter: Payer: Self-pay | Admitting: Adult Health

## 2021-11-09 DIAGNOSIS — R87613 High grade squamous intraepithelial lesion on cytologic smear of cervix (HGSIL): Secondary | ICD-10-CM

## 2021-11-09 HISTORY — DX: High grade squamous intraepithelial lesion on cytologic smear of cervix (HGSIL): R87.613

## 2021-11-09 LAB — CYTOLOGY - PAP
Adequacy: ABSENT
Comment: NEGATIVE
Diagnosis: HIGH — AB
High risk HPV: NEGATIVE

## 2021-11-12 ENCOUNTER — Ambulatory Visit (HOSPITAL_COMMUNITY)
Admission: RE | Admit: 2021-11-12 | Discharge: 2021-11-12 | Disposition: A | Discharge status: Home / Self Care | Payer: BC Managed Care – PPO | Source: Ambulatory Visit | Attending: Adult Health | Admitting: Adult Health

## 2021-11-12 ENCOUNTER — Other Ambulatory Visit: Payer: Self-pay

## 2021-11-12 ENCOUNTER — Telehealth: Payer: Self-pay | Admitting: Adult Health

## 2021-11-12 DIAGNOSIS — Z1231 Encounter for screening mammogram for malignant neoplasm of breast: Secondary | ICD-10-CM | POA: Insufficient documentation

## 2021-11-12 NOTE — Telephone Encounter (Signed)
Patient calling stating that she got a message in my chart about her getting a biopsy  I don't see anything in her chart at all. Please advise if something needs to be scheduled.

## 2021-11-18 ENCOUNTER — Encounter: Payer: Self-pay | Admitting: Women's Health

## 2021-11-18 ENCOUNTER — Ambulatory Visit (INDEPENDENT_AMBULATORY_CARE_PROVIDER_SITE_OTHER): Payer: BC Managed Care – PPO | Admitting: Women's Health

## 2021-11-18 ENCOUNTER — Telehealth: Payer: Self-pay

## 2021-11-18 ENCOUNTER — Other Ambulatory Visit: Payer: Self-pay

## 2021-11-18 ENCOUNTER — Other Ambulatory Visit (HOSPITAL_COMMUNITY)
Admission: RE | Admit: 2021-11-18 | Discharge: 2021-11-18 | Disposition: A | Discharge status: Home / Self Care | Payer: BC Managed Care – PPO | Source: Ambulatory Visit | Attending: Women's Health | Admitting: Women's Health

## 2021-11-18 VITALS — BP 109/75 | HR 85 | Ht 63.0 in | Wt 197.0 lb

## 2021-11-18 DIAGNOSIS — R87613 High grade squamous intraepithelial lesion on cytologic smear of cervix (HGSIL): Secondary | ICD-10-CM

## 2021-11-18 DIAGNOSIS — D069 Carcinoma in situ of cervix, unspecified: Secondary | ICD-10-CM | POA: Diagnosis not present

## 2021-11-18 NOTE — Telephone Encounter (Signed)
PA for Roselyn Meier has been sent.  (Key: [id number])  Your information has been sent to [employer].

## 2021-11-18 NOTE — Progress Notes (Signed)
° °  COLPOSCOPY PROCEDURE NOTE Patient name: Mary Parker MRN [id number]  Date of birth: 08-May-1981 Subjective Findings:   SCARLETTE HOGSTON is a 41 y.o. G42P3003 Caucasian female being seen today for a colposcopy. 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 Patient has had a hysterectomy. Contraception: status post hysterectomy & abstinence, no change in sex partners Hysterectomy: yes 64yr s/p endometrial ablation for hematometra .   Smoker: former . Immunocompromised: no.   The risks and benefits were explained and informed consent was obtained, and written copy is in chart. Pertinent History Reviewed:   Reviewed past medical,surgical, social, obstetrical and family history.  Reviewed problem list, medications and allergies. Objective Findings & Procedure:   Vitals:   11/18/21 1131  BP: 109/75  Pulse: 85  Weight: 197 lb (89.4 kg)  Height: 5\' 3"  (1.6 m)  Body mass index is 34.9 kg/m.  No results found for this or any previous visit (from the past 24 hour(s)).   Time out was performed.  Speculum placed in the vagina, cervix fully visualized. SCJ: fully visualized. Cervix swabbed x 3 with acetic acid.  Acetowhitening present: Yes Cervix: no visible lesions, no mosaicism, no punctation, no abnormal vasculature, and acetowhite lesion(s) noted at 7 & 9 o'clock. Endocervical curettage performed and Cervical biopsies taken at 7, 9 , 3 o'clock; hemostasis achieved w/ Monsels Vagina: vaginal colposcopy not performed Vulva: vulvar colposcopy not performed  Specimens: 4  Complications: none  Chaperone:    Colposcopic Impression & Plan:   Colposcopy findings consistent with HSIL Plan: Post biopsy instructions given, Will notify patient of results when back, and Will base plan of care on pathology  results and ASCCP guidelines  Return in about 1 year (around 11/18/2022) for Pap & physical.  11/20/2022 CNM, WHNP-BC 11/18/2021 1:05 PM

## 2021-11-18 NOTE — Patient Instructions (Signed)
Colposcopy, Care After ?The following information offers guidance on how to care for yourself after your procedure. Your health care provider may also give you more specific instructions. If you have problems or questions, contact your health care provider. ?What can I expect after the procedure? ?If you had a colposcopy without a biopsy, you can expect to feel fine right away after your procedure. However, you may have some spotting of blood for a few days. You can return to your normal activities. ?If you had a colposcopy with a biopsy, it is common after the procedure to have: ?Soreness and mild pain. These may last for a few days. ?Mild vaginal bleeding or discharge that is dark-colored and grainy. This may last for a few days. The discharge may be caused by a liquid (solution) that was used during the procedure. You may need to wear a sanitary pad during this time. ?Spotting of blood for at least 48 hours after the procedure. ?Follow these instructions at home: ?Medicines ?Take over-the-counter and prescription medicines only as told by your health care provider. ?Talk with your health care provider about what type of over-the-counter pain medicines and prescription medicines you can start to take again. It is especially important to talk with your health care provider if you take blood thinners. ?Activity ?Avoid using douche products, using tampons, and having sex for at least 3 days after the procedure or for as long as told by your health care provider. ?Return to your normal activities as told by your health care provider. Ask your health care provider what activities are safe for you. ?General instructions ?Ask your health care provider if you may take baths, swim, or use a hot tub. You may take showers. ?If you use birth control (contraception), continue to use it. ?Keep all follow-up visits. This is important. ?Contact a health care provider if: ?You have a fever or chills. ?You faint or feel  light-headed. ?Get help right away if: ?You have heavy bleeding from your vagina or pass blood clots. Heavy bleeding is bleeding that soaks through a sanitary pad in less than 1 hour. ?You have vaginal discharge that is abnormal, is yellow in color, or smells bad. This could be a sign of infection. ?You have severe pain or cramps in your lower abdomen that do not go away with medicine. ?Summary ?If you had a colposcopy without a biopsy, you can expect to feel fine right away, but you may have some spotting of blood for a few days. You can return to your normal activities. ?If you had a colposcopy with a biopsy, it is common to have mild pain for a few days and spotting for 48 hours after the procedure. ?Avoid using douche products, using tampons, and having sex for at least 3 days after the procedure or for as long as told by your health care provider. ?Get help right away if you have heavy bleeding, severe pain, or signs of infection. ?This information is not intended to replace advice given to you by your health care provider. Make sure you discuss any questions you have with your health care provider. ?Document Revised: 03/15/2021 Document Reviewed: 03/15/2021 ?Elsevier Patient Education ? 2022 [employer] ? ?

## 2021-11-19 NOTE — Telephone Encounter (Signed)
Further information was needed for PA. I have completed and faxed back to [facility] Fax # [phone].

## 2021-11-19 NOTE — Telephone Encounter (Signed)
[id number] approved through 11/18/2022.

## 2021-11-20 LAB — SURGICAL PATHOLOGY

## 2021-11-25 ENCOUNTER — Other Ambulatory Visit: Payer: Self-pay

## 2021-11-25 ENCOUNTER — Ambulatory Visit (INDEPENDENT_AMBULATORY_CARE_PROVIDER_SITE_OTHER): Payer: BC Managed Care – PPO | Admitting: Gastroenterology

## 2021-11-25 ENCOUNTER — Encounter: Payer: Self-pay | Admitting: Gastroenterology

## 2021-11-25 DIAGNOSIS — K641 Second degree hemorrhoids: Secondary | ICD-10-CM

## 2021-11-25 NOTE — Progress Notes (Signed)
[facility] Banding Note:   Mary Parker is a 41 y.o. female presenting today for consideration of hemorrhoid banding. Last colonoscopy 2018 unrevealing. She has had symptomatic hemorrhoids with bleeding, itching, discomfort rare at times.    The patient presents with symptomatic grade 2 hemorrhoids, unresponsive to maximal medical therapy, requesting rubber band ligation of her hemorrhoidal disease. All risks, benefits, and alternative forms of therapy were described and informed consent was obtained.  In the left lateral decubitus position, anoscopic examination revealed grade 2 hemorrhoids in the left lateral and right posterior positions predominantly.    The decision was made to band the left lateral internal hemorrhoid, and the [facility]'Regan System was used to perform band ligation without complication. Digital anorectal examination was then performed to assure proper positioning of the band, and to adjust the banded tissue as required. The patient was discharged home without pain or other issues. Dietary and behavioral recommendations were given and (if necessary prescriptions were given), along with follow-up instructions. The patient will return for followup and possible additional banding as required.  No complications were encountered and the patient tolerated the procedure well.   Gelene Mink, PhD, ANP-BC [facility]

## 2021-11-25 NOTE — Patient Instructions (Signed)
Continue to avoid straining, and limit toilet time to 2-3 minutes.  We will see you in follow-up for further banding!  I enjoyed seeing you again today! As you know, I value our relationship and want to provide genuine, compassionate, and quality care. I welcome your feedback. If you receive a survey regarding your visit,  I greatly appreciate you taking time to fill this out. See you next time!  Gelene Mink, PhD, ANP-BC [facility]

## 2021-12-01 ENCOUNTER — Ambulatory Visit: Payer: BC Managed Care – PPO | Admitting: Obstetrics & Gynecology

## 2021-12-16 ENCOUNTER — Other Ambulatory Visit: Payer: Self-pay | Admitting: *Deleted

## 2021-12-18 ENCOUNTER — Encounter: Payer: Self-pay | Admitting: Obstetrics & Gynecology

## 2021-12-18 ENCOUNTER — Other Ambulatory Visit: Payer: Self-pay

## 2021-12-18 ENCOUNTER — Ambulatory Visit (INDEPENDENT_AMBULATORY_CARE_PROVIDER_SITE_OTHER): Payer: BC Managed Care – PPO | Admitting: Obstetrics & Gynecology

## 2021-12-18 VITALS — BP 105/75 | HR 75 | Ht 64.0 in | Wt 201.0 lb

## 2021-12-18 DIAGNOSIS — R87613 High grade squamous intraepithelial lesion on cytologic smear of cervix (HGSIL): Secondary | ICD-10-CM | POA: Diagnosis not present

## 2021-12-18 NOTE — Progress Notes (Signed)
Follow up appointment for results  Chief Complaint  Patient presents with   discuss result colpo    Blood pressure 105/75, pulse 75, height 5\' 4"  (1.626 m), weight 201 lb (91.2 kg).  Cervical biopsy: Multifocal high grade cervical dysplasia    MEDS ordered this encounter: No orders of the defined types were placed in this encounter.   Orders for this encounter: No orders of the defined types were placed in this encounter.   Impression: High grade cervical dysplasia, multifocal based on biopsy/colposcopy findings  Plan: Laser ablation of the cervix  Follow Up: Return in about 3 weeks (around 01/08/2022) for Post Op, with Dr 03/10/2022.      All questions were answered.  Past Medical History:  Diagnosis Date   Anxiety    Arthritis    both knees   Body aches 12/11/2014   Chronic insomnia    Chronic low back pain 05/29/2015   Common migraine 12/19/2014   Depression 12/11/2014   Fibrocystic disease of both breasts    Fibromyalgia 12/19/2014   GERD (gastroesophageal reflux disease)    History of hiatal hernia    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/09/2021   11/09/21 needs colposcopy   IBS (irritable bowel syndrome)    Knee pain, right    Migraines    Rebound headache 12/19/2014   Right ovarian cyst 04/25/2017   Complex, check CA 125 and repeat 04/27/2017 in 6 weeks    Sciatica of left side    Seizures (HCC)    remote past, felt it was related to Chantix    Sleep apnea    Vitamin D deficiency    Wears glasses     Past Surgical History:  Procedure Laterality Date   BILATERAL SALPINGECTOMY Bilateral 06/21/2017   Procedure: BILATERAL SALPINGECTOMY;  Surgeon: 06/23/2017, MD;  Location: [facility];  Service: Gynecology;  Laterality: Bilateral;   CESAREAN SECTION  [phone]   CHOLECYSTECTOMY  2001   COLONOSCOPY WITH PROPOFOL N/A 04/07/2017   Dr. 06/07/2017: Normal, abnormal CT likely artifactual   ENDOMETRIAL ABLATION      ESOPHAGOGASTRODUODENOSCOPY  2002   [facility]: normal   KNEE ARTHROSCOPY WITH LATERAL RELEASE Right 04/18/2018   Procedure: Right knee arthroscopy with chondroplasty and lateral release;  Surgeon: 04/20/2018, MD;  Location: [facility];  Service: Orthopedics;  Laterality: Right;  60 mins   SUPRACERVICAL ABDOMINAL HYSTERECTOMY N/A 06/21/2017   Procedure: HYSTERECTOMY SUPRACERVICAL ABDOMINAL;  Surgeon: 06/23/2017, MD;  Location: [facility];  Service: Gynecology;  Laterality: N/A;   TUBAL LIGATION      OB History     Gravida  3   Para  3   Term  3   Preterm      AB      Living  3      SAB      IAB      Ectopic      Multiple      Live Births  3           Allergies  Allergen Reactions   Bupropion Other (See Comments)    Suicidal ideation    Duloxetine Hcl Hives, Itching and Rash   Gabapentin Itching   Meloxicam Other (See Comments)    Abdominal cramping and mood changes   Pregabalin Other (See Comments)    Caused TREMORS    Amitriptyline Other (See Comments)    Allergy occurred awhile back- reaction not  recalled   Ciprofloxacin Hives, Swelling and Other (See Comments)    Sweating and  Dizziness, also   Desvenlafaxine Succinate Er Other (See Comments)    "Made me feel like I couldn't urinate"   Hydrocodone-Acetaminophen Swelling   Milnacipran Nausea Only and Other (See Comments)    Dizziness and leg cramps, also    Nortriptyline Nausea And Vomiting and Other (See Comments)    GERD, also   Topiramate Swelling and Other (See Comments)    Dizziness and slurred speech, also   Decadron [Dexamethasone] Other (See Comments)    Flushing, dizziness, and the patient PASSED OUT   Prednisone     Tablets- caused black/pass out.  Ok when used Blister pack per pt.   Doxycycline Nausea Only   Percocet [Oxycodone-Acetaminophen] Nausea Only    Social History   Socioeconomic History   Marital status: Divorced    Spouse name: Not on file    Number of children: 3   Years of education: HS   Highest education level: Not on file  Occupational History   Occupation: [employer]  Tobacco Use   Smoking status: Former    Packs/day: 0.25    Years: 25.00    Pack years: 6.25    Types: Cigarettes    Quit date: 04/02/2019    Years since quitting: 2.7   Smokeless tobacco: Never  Vaping Use   Vaping Use: Never used  Substance and Sexual Activity   Alcohol use: No   Drug use: No   Sexual activity: Not Currently    Birth control/protection: Surgical    Comment: supracervical hyst and tubal  Other Topics Concern   Not on file  Social History Narrative   Patient is right handed.   Patient drinks 1-2 cups of caffeine dailyy.   Patient lives mom stepdad, and children.   Social Determinants of Health   Financial Resource Strain: Low Risk    Difficulty of Paying Living Expenses: Not very hard  Food Insecurity: Food Insecurity Present   Worried About Running Out of Food in the Last Year: Sometimes true   Ran Out of Food in the Last Year: Sometimes true  Transportation Needs: No Transportation Needs   Lack of Transportation (Medical): No   Lack of Transportation (Non-Medical): No  Physical Activity: Inactive   Days of Exercise per Week: 0 days   Minutes of Exercise per Session: 0 min  Stress: Stress Concern Present   Feeling of Stress : Very much  Social Connections: Socially Isolated   Frequency of Communication with Friends and Family: Once a week   Frequency of Social Gatherings with Friends and Family: Once a week   Attends Religious Services: Never   Database administrator or Organizations: No   Attends Engineer, structural: Never   Marital Status: Divorced    Family History  Problem Relation Age of Onset   Cancer Maternal Grandmother    Osteoporosis Maternal Grandmother    Cancer Maternal Grandfather    Diabetes Father    Hypertension Father    Alcohol abuse Father    Breast cancer Mother     Hypertension Mother    Other Mother        abnormal cells; had hyst   Obesity Daughter    Diabetes Daughter 6       youngest   Obesity Daughter    Obesity Son    Colon cancer Neg Hx

## 2021-12-21 ENCOUNTER — Telehealth: Payer: Self-pay | Admitting: Obstetrics & Gynecology

## 2021-12-21 NOTE — Telephone Encounter (Signed)
Patient called stating that she is having surgery in march and she would like to know how many days she needs to be off of work before returning. She is going to inform her employer. Please contact pt

## 2021-12-21 NOTE — Telephone Encounter (Signed)
Spoke with patient. Informed average time out for this procedure is 2-3 days.  Patient states she works some Saturdays so would like to be out until the following Monday. Letter sent to patient via mychart.

## 2021-12-24 NOTE — Patient Instructions (Signed)
Your procedure is scheduled on: 12/30/2021  Report to [facility] Main Entrance at   7:30  AM.  Call this number if you have problems the morning of surgery: [phone]   Remember:   Do not Eat or Drink after midnight         No Smoking the morning of surgery  :  Take these medicines the morning of surgery with A SIP OF WATER: xanax, lexapro, and Ubrelvy if needed   Do not wear jewelry, make-up or nail polish.  Do not wear lotions, powders, or perfumes. You may wear deodorant.  Do not shave 48 hours prior to surgery. Men may shave face and neck.  Do not bring valuables to the hospital.  Contacts, dentures or bridgework may not be worn into surgery.  Leave suitcase in the car. After surgery it may be brought to your room.  For patients admitted to the hospital, checkout time is 11:00 AM the day of discharge.   Patients discharged the day of surgery will not be allowed to drive home.    Special Instructions: Shower using CHG night before surgery and shower the day of surgery use CHG.  Use special wash - you have one bottle of CHG for all showers.  You should use approximately 1/2 of the bottle for each shower.  How to Use Chlorhexidine for Bathing Chlorhexidine gluconate (CHG) is a germ-killing (antiseptic) solution that is used to clean the skin. It can get rid of the bacteria that normally live on the skin and can keep them away for about 24 hours. To clean your skin with CHG, you may be given: A CHG solution to use in the shower or as part of a sponge bath. A prepackaged cloth that contains CHG. Cleaning your skin with CHG may help lower the risk for infection: While you are staying in the intensive care unit of the hospital. If you have a vascular access, such as a central line, to provide short-term or long-term access to your veins. If you have a catheter to drain urine from your bladder. If you are on a ventilator. A ventilator is a machine that helps you breathe by moving air  in and out of your lungs. After surgery. What are the risks? Risks of using CHG include: A skin reaction. Hearing loss, if CHG gets in your ears and you have a perforated eardrum. Eye injury, if CHG gets in your eyes and is not rinsed out. The CHG product catching fire. Make sure that you avoid smoking and flames after applying CHG to your skin. Do not use CHG: If you have a chlorhexidine allergy or have previously reacted to chlorhexidine. On babies younger than 63 months of age. How to use CHG solution Use CHG only as told by your health care provider, and follow the instructions on the label. Use the full amount of CHG as directed. Usually, this is one bottle. During a shower Follow these steps when using CHG solution during a shower (unless your health care provider gives you different instructions): Start the shower. Use your normal soap and shampoo to wash your face and hair. Turn off the shower or move out of the shower stream. Pour the CHG onto a clean washcloth. Do not use any type of brush or rough-edged sponge. Starting at your neck, lather your body down to your toes. Make sure you follow these instructions: If you will be having surgery, pay special attention to the part of your body where you  will be having surgery. Scrub this area for at least 1 minute. Do not use CHG on your head or face. If the solution gets into your ears or eyes, rinse them well with water. Avoid your genital area. Avoid any areas of skin that have broken skin, cuts, or scrapes. Scrub your back and under your arms. Make sure to wash skin folds. Let the lather sit on your skin for 1-2 minutes or as long as told by your health care provider. Thoroughly rinse your entire body in the shower. Make sure that all body creases and crevices are rinsed well. Dry off with a clean towel. Do not put any substances on your body afterward--such as powder, lotion, or perfume--unless you are told to do so by your health  care provider. Only use lotions that are recommended by the manufacturer. Put on clean clothes or pajamas. If it is the night before your surgery, sleep in clean sheets.  During a sponge bath Follow these steps when using CHG solution during a sponge bath (unless your health care provider gives you different instructions): Use your normal soap and shampoo to wash your face and hair. Pour the CHG onto a clean washcloth. Starting at your neck, lather your body down to your toes. Make sure you follow these instructions: If you will be having surgery, pay special attention to the part of your body where you will be having surgery. Scrub this area for at least 1 minute. Do not use CHG on your head or face. If the solution gets into your ears or eyes, rinse them well with water. Avoid your genital area. Avoid any areas of skin that have broken skin, cuts, or scrapes. Scrub your back and under your arms. Make sure to wash skin folds. Let the lather sit on your skin for 1-2 minutes or as long as told by your health care provider. Using a different clean, wet washcloth, thoroughly rinse your entire body. Make sure that all body creases and crevices are rinsed well. Dry off with a clean towel. Do not put any substances on your body afterward--such as powder, lotion, or perfume--unless you are told to do so by your health care provider. Only use lotions that are recommended by the manufacturer. Put on clean clothes or pajamas. If it is the night before your surgery, sleep in clean sheets. How to use CHG prepackaged cloths Only use CHG cloths as told by your health care provider, and follow the instructions on the label. Use the CHG cloth on clean, dry skin. Do not use the CHG cloth on your head or face unless your health care provider tells you to. When washing with the CHG cloth: Avoid your genital area. Avoid any areas of skin that have broken skin, cuts, or scrapes. Before surgery Follow these  steps when using a CHG cloth to clean before surgery (unless your health care provider gives you different instructions): Using the CHG cloth, vigorously scrub the part of your body where you will be having surgery. Scrub using a back-and-forth motion for 3 minutes. The area on your body should be completely wet with CHG when you are done scrubbing. Do not rinse. Discard the cloth and let the area air-dry. Do not put any substances on the area afterward, such as powder, lotion, or perfume. Put on clean clothes or pajamas. If it is the night before your surgery, sleep in clean sheets.  For general bathing Follow these steps when using CHG cloths for general bathing (  unless your health care provider gives you different instructions). Use a separate CHG cloth for each area of your body. Make sure you wash between any folds of skin and between your fingers and toes. Wash your body in the following order, switching to a new cloth after each step: The front of your neck, shoulders, and chest. Both of your arms, under your arms, and your hands. Your stomach and groin area, avoiding the genitals. Your right leg and foot. Your left leg and foot. The back of your neck, your back, and your buttocks. Do not rinse. Discard the cloth and let the area air-dry. Do not put any substances on your body afterward--such as powder, lotion, or perfume--unless you are told to do so by your health care provider. Only use lotions that are recommended by the manufacturer. Put on clean clothes or pajamas. Contact a health care provider if: Your skin gets irritated after scrubbing. You have questions about using your solution or cloth. You swallow any chlorhexidine. Call your local poison control center [phone] in the [place] Get help right away if: Your eyes itch badly, or they become very red or swollen. Your skin itches badly and is red or swollen. Your hearing changes. You have trouble seeing. You have  swelling or tingling in your mouth or throat. You have trouble breathing. These symptoms may represent a serious problem that is an emergency. Do not wait to see if the symptoms will go away. Get medical help right away. Call your local emergency services (911 in the [place] Do not drive yourself to the hospital. Summary Chlorhexidine gluconate (CHG) is a germ-killing (antiseptic) solution that is used to clean the skin. Cleaning your skin with CHG may help to lower your risk for infection. You may be given CHG to use for bathing. It may be in a bottle or in a prepackaged cloth to use on your skin. Carefully follow your health care provider's instructions and the instructions on the product label. Do not use CHG if you have a chlorhexidine allergy. Contact your health care provider if your skin gets irritated after scrubbing. This information is not intended to replace advice given to you by your health care provider. Make sure you discuss any questions you have with your health care provider. Document Revised: 12/29/2020 Document Reviewed: 12/29/2020 Elsevier Patient Education  2022 [employer] Cervical Laser Surgery, Care After This sheet gives you information about how to care for yourself after your procedure. Your health care provider may also give you more specific instructions. If you have problems or questions, contact your health care provider. What can I expect after the procedure? After the procedure, it is common to have: Pain or discomfort. Mild cramping. Bleeding, spotting, or brownish discharge from your vagina. Follow these instructions at home: Activity  Rest as told by your health care provider. Do not lift anything that is heavier than 10 lb (4.5 kg), or the limit that you are told, until your health care provider says that it is safe. Do not have sex until your health care provider says it is okay. Return to your normal activities as told by your health care provider.  Ask your health care provider what activities are safe for you. General instructions Take over-the-counter and prescription medicines only as told by your health care provider. Ask your health care provider if the medicine prescribed to you requires you to avoid driving or using heavy machinery. Wear sanitary pads to absorb any bleeding, spotting, and discharge. Do  not douche or put anything into your vagina, including tampons, until your health care provider says it is okay. It is up to you to get the results of your procedure. Ask your health care provider, or the department that is doing the procedure, when your results will be ready. Keep all follow-up visits as told by your health care provider. This is important. Contact a health care provider if: Your pain or cramping does not improve. Your periods are more painful than usual. You do not get your period as expected. Get help right away if you have: Any symptoms of infection, such as: A fever. Chills. Discharge that smells bad. Severe pain in your abdomen. Heavy bleeding from your vagina (more than a normal period). Vaginal bleeding with clumps of blood (blood clots). Summary After this procedure, it is common to have pain or discomfort and mild cramping. It is also common to have bleeding, spotting, or brownish discharge from your vagina. Do not have sex, douche, use tampons, or put anything in your vagina until your health care provider says it is okay. Return to your normal activities as told by your health care provider. Ask your health care provider what activities are safe for you. Take over-the-counter and prescription medicines only as told by your health care provider. You may need to wear sanitary pads to absorb any bleeding, spotting, and discharge. This information is not intended to replace advice given to you by your health care provider. Make sure you discuss any questions you have with your health care  provider. Document Revised: 04/02/2019 Document Reviewed: 04/02/2019 Elsevier Patient Education  2022 [employer]. General Anesthesia, Adult, Care After This sheet gives you information about how to care for yourself after your procedure. Your health care provider may also give you more specific instructions. If you have problems or questions, contact your health care provider. What can I expect after the procedure? After the procedure, the following side effects are common: Pain or discomfort at the IV site. Nausea. Vomiting. Sore throat. Trouble concentrating. Feeling cold or chills. Feeling weak or tired. Sleepiness and fatigue. Soreness and body aches. These side effects can affect parts of the body that were not involved in surgery. Follow these instructions at home: For the time period you were told by your health care provider:  Rest. Do not participate in activities where you could fall or become injured. Do not drive or use machinery. Do not drink alcohol. Do not take sleeping pills or medicines that cause drowsiness. Do not make important decisions or sign legal documents. Do not take care of children on your own. Eating and drinking Follow any instructions from your health care provider about eating or drinking restrictions. When you feel hungry, start by eating small amounts of foods that are soft and easy to digest (bland), such as toast. Gradually return to your regular diet. Drink enough fluid to keep your urine pale yellow. If you vomit, rehydrate by drinking water, juice, or clear broth. General instructions If you have sleep apnea, surgery and certain medicines can increase your risk for breathing problems. Follow instructions from your health care provider about wearing your sleep device: Anytime you are sleeping, including during daytime naps. While taking prescription pain medicines, sleeping medicines, or medicines that make you drowsy. Have a responsible  adult stay with you for the time you are told. It is important to have someone help care for you until you are awake and alert. Return to your normal activities as told  by your health care provider. Ask your health care provider what activities are safe for you. Take over-the-counter and prescription medicines only as told by your health care provider. If you smoke, do not smoke without supervision. Keep all follow-up visits as told by your health care provider. This is important. Contact a health care provider if: You have nausea or vomiting that does not get better with medicine. You cannot eat or drink without vomiting. You have pain that does not get better with medicine. You are unable to pass urine. You develop a skin rash. You have a fever. You have redness around your IV site that gets worse. Get help right away if: You have difficulty breathing. You have chest pain. You have blood in your urine or stool, or you vomit blood. Summary After the procedure, it is common to have a sore throat or nausea. It is also common to feel tired. Have a responsible adult stay with you for the time you are told. It is important to have someone help care for you until you are awake and alert. When you feel hungry, start by eating small amounts of foods that are soft and easy to digest (bland), such as toast. Gradually return to your regular diet. Drink enough fluid to keep your urine pale yellow. Return to your normal activities as told by your health care provider. Ask your health care provider what activities are safe for you. This information is not intended to replace advice given to you by your health care provider. Make sure you discuss any questions you have with your health care provider. Document Revised: 07/03/2020 Document Reviewed: 01/31/2020 Elsevier Patient Education  2022 [employer].

## 2021-12-25 ENCOUNTER — Other Ambulatory Visit: Payer: Self-pay | Admitting: Obstetrics & Gynecology

## 2021-12-25 DIAGNOSIS — Z01818 Encounter for other preprocedural examination: Secondary | ICD-10-CM

## 2021-12-28 ENCOUNTER — Encounter (HOSPITAL_COMMUNITY)
Admission: RE | Admit: 2021-12-28 | Discharge: 2021-12-28 | Disposition: A | Discharge status: Home / Self Care | Payer: BC Managed Care – PPO | Source: Ambulatory Visit | Attending: Obstetrics & Gynecology | Admitting: Obstetrics & Gynecology

## 2021-12-28 ENCOUNTER — Encounter (HOSPITAL_COMMUNITY): Payer: BC Managed Care – PPO

## 2021-12-28 ENCOUNTER — Encounter (HOSPITAL_COMMUNITY): Payer: Self-pay

## 2021-12-28 DIAGNOSIS — Z01818 Encounter for other preprocedural examination: Secondary | ICD-10-CM

## 2021-12-28 DIAGNOSIS — R569 Unspecified convulsions: Secondary | ICD-10-CM | POA: Diagnosis not present

## 2021-12-28 DIAGNOSIS — G709 Myoneural disorder, unspecified: Secondary | ICD-10-CM | POA: Diagnosis not present

## 2021-12-28 DIAGNOSIS — R87613 High grade squamous intraepithelial lesion on cytologic smear of cervix (HGSIL): Secondary | ICD-10-CM | POA: Diagnosis not present

## 2021-12-28 DIAGNOSIS — Z01812 Encounter for preprocedural laboratory examination: Secondary | ICD-10-CM | POA: Insufficient documentation

## 2021-12-28 DIAGNOSIS — Z90710 Acquired absence of both cervix and uterus: Secondary | ICD-10-CM | POA: Diagnosis not present

## 2021-12-28 DIAGNOSIS — M797 Fibromyalgia: Secondary | ICD-10-CM | POA: Diagnosis not present

## 2021-12-28 DIAGNOSIS — K219 Gastro-esophageal reflux disease without esophagitis: Secondary | ICD-10-CM | POA: Diagnosis not present

## 2021-12-28 DIAGNOSIS — F32A Depression, unspecified: Secondary | ICD-10-CM | POA: Diagnosis not present

## 2021-12-28 DIAGNOSIS — G4730 Sleep apnea, unspecified: Secondary | ICD-10-CM | POA: Diagnosis not present

## 2021-12-28 DIAGNOSIS — Z8673 Personal history of transient ischemic attack (TIA), and cerebral infarction without residual deficits: Secondary | ICD-10-CM | POA: Diagnosis not present

## 2021-12-28 DIAGNOSIS — F419 Anxiety disorder, unspecified: Secondary | ICD-10-CM | POA: Diagnosis not present

## 2021-12-28 DIAGNOSIS — Z87891 Personal history of nicotine dependence: Secondary | ICD-10-CM | POA: Diagnosis not present

## 2021-12-28 HISTORY — DX: Cerebral infarction, unspecified: I63.9

## 2021-12-28 HISTORY — DX: Bell's palsy: G51.0

## 2021-12-28 LAB — URINALYSIS, ROUTINE W REFLEX MICROSCOPIC
Bilirubin Urine: NEGATIVE
Glucose, UA: NEGATIVE mg/dL
Hgb urine dipstick: NEGATIVE
Ketones, ur: NEGATIVE mg/dL
Nitrite: NEGATIVE
Protein, ur: NEGATIVE mg/dL
Specific Gravity, Urine: 1.018 (ref 1.005–1.030)
pH: 5 (ref 5.0–8.0)

## 2021-12-28 LAB — CBC
HCT: 37.6 % (ref 36.0–46.0)
Hemoglobin: 11.9 g/dL — ABNORMAL LOW (ref 12.0–15.0)
MCH: 30.9 pg (ref 26.0–34.0)
MCHC: 31.6 g/dL (ref 30.0–36.0)
MCV: 97.7 fL (ref 80.0–100.0)
Platelets: 192 10*3/uL (ref 150–400)
RBC: 3.85 MIL/uL — ABNORMAL LOW (ref 3.87–5.11)
RDW: 13.2 % (ref 11.5–15.5)
WBC: 6.3 10*3/uL (ref 4.0–10.5)
nRBC: 0 % (ref 0.0–0.2)

## 2021-12-28 LAB — COMPREHENSIVE METABOLIC PANEL
ALT: 13 U/L (ref 0–44)
AST: 13 U/L — ABNORMAL LOW (ref 15–41)
Albumin: 3.4 g/dL — ABNORMAL LOW (ref 3.5–5.0)
Alkaline Phosphatase: 53 U/L (ref 38–126)
Anion gap: 4 — ABNORMAL LOW (ref 5–15)
BUN: 6 mg/dL (ref 6–20)
CO2: 27 mmol/L (ref 22–32)
Calcium: 8.5 mg/dL — ABNORMAL LOW (ref 8.9–10.3)
Chloride: 104 mmol/L (ref 98–111)
Creatinine, Ser: 0.62 mg/dL (ref 0.44–1.00)
GFR, Estimated: >60 mL/min (ref >60)
Glucose, Bld: 77 mg/dL (ref 70–99)
Potassium: 3.4 mmol/L — ABNORMAL LOW (ref 3.5–5.1)
Sodium: 135 mmol/L (ref 135–145)
Total Bilirubin: 0.3 mg/dL (ref 0.3–1.2)
Total Protein: 6.3 g/dL — ABNORMAL LOW (ref 6.5–8.1)

## 2021-12-28 LAB — RAPID HIV SCREEN (HIV 1/2 AB+AG)
HIV 1/2 Antibodies: NONREACTIVE
HIV-1 P24 Antigen - HIV24: NONREACTIVE

## 2021-12-28 LAB — HCG, QUANTITATIVE, PREGNANCY: hCG, Beta Chain, Quant, S: <1 m[IU]/mL (ref <5)

## 2021-12-30 ENCOUNTER — Encounter (HOSPITAL_COMMUNITY)
Admission: RE | Disposition: A | Discharge status: Home / Self Care | Payer: Self-pay | Source: Home / Self Care | Attending: Obstetrics & Gynecology

## 2021-12-30 ENCOUNTER — Ambulatory Visit (HOSPITAL_COMMUNITY)
Admission: RE | Admit: 2021-12-30 | Discharge: 2021-12-30 | Disposition: A | Discharge status: Home / Self Care | Payer: BC Managed Care – PPO | Attending: Obstetrics & Gynecology | Admitting: Obstetrics & Gynecology

## 2021-12-30 ENCOUNTER — Other Ambulatory Visit: Payer: Self-pay

## 2021-12-30 ENCOUNTER — Ambulatory Visit (HOSPITAL_COMMUNITY): Payer: BC Managed Care – PPO | Admitting: Anesthesiology

## 2021-12-30 DIAGNOSIS — F32A Depression, unspecified: Secondary | ICD-10-CM | POA: Diagnosis not present

## 2021-12-30 DIAGNOSIS — F419 Anxiety disorder, unspecified: Secondary | ICD-10-CM | POA: Insufficient documentation

## 2021-12-30 DIAGNOSIS — K219 Gastro-esophageal reflux disease without esophagitis: Secondary | ICD-10-CM | POA: Insufficient documentation

## 2021-12-30 DIAGNOSIS — Z90710 Acquired absence of both cervix and uterus: Secondary | ICD-10-CM | POA: Diagnosis not present

## 2021-12-30 DIAGNOSIS — G709 Myoneural disorder, unspecified: Secondary | ICD-10-CM | POA: Insufficient documentation

## 2021-12-30 DIAGNOSIS — G4730 Sleep apnea, unspecified: Secondary | ICD-10-CM | POA: Diagnosis not present

## 2021-12-30 DIAGNOSIS — M797 Fibromyalgia: Secondary | ICD-10-CM | POA: Insufficient documentation

## 2021-12-30 DIAGNOSIS — R569 Unspecified convulsions: Secondary | ICD-10-CM | POA: Insufficient documentation

## 2021-12-30 DIAGNOSIS — R87613 High grade squamous intraepithelial lesion on cytologic smear of cervix (HGSIL): Secondary | ICD-10-CM | POA: Insufficient documentation

## 2021-12-30 DIAGNOSIS — Z87891 Personal history of nicotine dependence: Secondary | ICD-10-CM | POA: Insufficient documentation

## 2021-12-30 DIAGNOSIS — Z8673 Personal history of transient ischemic attack (TIA), and cerebral infarction without residual deficits: Secondary | ICD-10-CM | POA: Diagnosis not present

## 2021-12-30 HISTORY — PX: LASER ABLATION CONDOLAMATA: SHX5941

## 2021-12-30 SURGERY — ABLATION, CONDYLOMA, USING LASER
Anesthesia: General | Site: Cervix

## 2021-12-30 MED ORDER — FERRIC SUBSULFATE 259 MG/GM EX SOLN
CUTANEOUS | Status: AC
  Filled 2021-12-30: qty 8

## 2021-12-30 MED ORDER — FENTANYL CITRATE (PF) 100 MCG/2ML IJ SOLN
INTRAMUSCULAR | Status: AC
  Filled 2021-12-30: qty 2

## 2021-12-30 MED ORDER — FENTANYL CITRATE (PF) 100 MCG/2ML IJ SOLN
INTRAMUSCULAR | Status: DC | PRN
  Administered 2021-12-30: 100 ug via INTRAVENOUS

## 2021-12-30 MED ORDER — CHLORHEXIDINE GLUCONATE 0.12 % MT SOLN
15.0000 mL | Freq: Once | OROMUCOSAL | Status: AC
  Administered 2021-12-30: 15 mL via OROMUCOSAL

## 2021-12-30 MED ORDER — FENTANYL CITRATE PF 50 MCG/ML IJ SOSY: 25.0000 ug | PREFILLED_SYRINGE | INTRAMUSCULAR | Status: DC | PRN

## 2021-12-30 MED ORDER — MEPERIDINE HCL 50 MG/ML IJ SOLN: 6.2500 mg | INTRAMUSCULAR | Status: DC | PRN

## 2021-12-30 MED ORDER — KETOROLAC TROMETHAMINE 10 MG PO TABS: 10.0000 mg | ORAL_TABLET | Freq: Three times a day (TID) | ORAL | 0 refills | Status: DC | PRN

## 2021-12-30 MED ORDER — ONDANSETRON HCL 4 MG/2ML IJ SOLN: 4.0000 mg | Freq: Once | INTRAMUSCULAR | Status: DC | PRN

## 2021-12-30 MED ORDER — PROPOFOL 10 MG/ML IV BOLUS
INTRAVENOUS | Status: DC | PRN
  Administered 2021-12-30: 200 mg via INTRAVENOUS

## 2021-12-30 MED ORDER — ORAL CARE MOUTH RINSE: 15.0000 mL | Freq: Once | OROMUCOSAL | Status: AC

## 2021-12-30 MED ORDER — LACTATED RINGERS IV SOLN: INTRAVENOUS | Status: DC

## 2021-12-30 MED ORDER — ONDANSETRON 8 MG PO TBDP: 8.0000 mg | ORAL_TABLET | Freq: Three times a day (TID) | ORAL | 0 refills | Status: DC | PRN

## 2021-12-30 MED ORDER — CEFAZOLIN SODIUM-DEXTROSE 2-4 GM/100ML-% IV SOLN
2.0000 g | INTRAVENOUS | Status: AC
  Administered 2021-12-30: 2 g via INTRAVENOUS
  Filled 2021-12-30: qty 100

## 2021-12-30 MED ORDER — PROPOFOL 10 MG/ML IV BOLUS
INTRAVENOUS | Status: AC
  Filled 2021-12-30: qty 20

## 2021-12-30 MED ORDER — ACETIC ACID 5 % SOLN
Status: DC | PRN
  Administered 2021-12-30: 1 via TOPICAL

## 2021-12-30 MED ORDER — MIDAZOLAM HCL 2 MG/2ML IJ SOLN
INTRAMUSCULAR | Status: AC
  Filled 2021-12-30: qty 2

## 2021-12-30 MED ORDER — MIDAZOLAM HCL 5 MG/5ML IJ SOLN
INTRAMUSCULAR | Status: DC | PRN
Start: 2021-12-30 — End: 2021-12-30
  Administered 2021-12-30: 2 mg via INTRAVENOUS

## 2021-12-30 MED ORDER — KETOROLAC TROMETHAMINE 30 MG/ML IJ SOLN
30.0000 mg | Freq: Once | INTRAMUSCULAR | Status: AC
  Administered 2021-12-30: 30 mg via INTRAVENOUS
  Filled 2021-12-30: qty 1

## 2021-12-30 MED ORDER — POVIDONE-IODINE 10 % EX SWAB: 2.0000 "application " | Freq: Once | CUTANEOUS | Status: DC

## 2021-12-30 MED ORDER — SILVER SULFADIAZINE 1 % EX CREA
TOPICAL_CREAM | CUTANEOUS | Status: AC
  Filled 2021-12-30: qty 50

## 2021-12-30 MED ORDER — ONDANSETRON HCL 4 MG/2ML IJ SOLN
INTRAMUSCULAR | Status: DC | PRN
  Administered 2021-12-30: 4 mg via INTRAVENOUS

## 2021-12-30 MED ORDER — FERRIC SUBSULFATE 259 MG/GM EX SOLN
CUTANEOUS | Status: DC | PRN
  Administered 2021-12-30: 1

## 2021-12-30 MED ORDER — WATER FOR IRRIGATION, STERILE IR SOLN
Status: DC | PRN
  Administered 2021-12-30: 1000 mL

## 2021-12-30 SURGICAL SUPPLY — 31 items
APL SWBSTK 6 STRL LF DISP (MISCELLANEOUS) ×1
APPLICATOR COTTON TIP 6 STRL (MISCELLANEOUS) IMPLANT
APPLICATOR COTTON TIP 6IN STRL (MISCELLANEOUS) ×2
BAG HAMPER (MISCELLANEOUS) ×3 IMPLANT
CLOTH BEACON ORANGE TIMEOUT ST (SAFETY) ×3 IMPLANT
COVER LIGHT HANDLE STERIS (MISCELLANEOUS) ×6 IMPLANT
ELECT REM PT RETURN 9FT ADLT (ELECTROSURGICAL) ×2
ELECTRODE REM PT RTRN 9FT ADLT (ELECTROSURGICAL) ×2 IMPLANT
GAUZE 4X4 16PLY ~~LOC~~+RFID DBL (SPONGE) ×4 IMPLANT
GLOVE ECLIPSE 8.0 STRL XLNG CF (GLOVE) ×3 IMPLANT
GLOVE SRG 8 PF TXTR STRL LF DI (GLOVE) ×2 IMPLANT
GLOVE SURG UNDER POLY LF SZ7 (GLOVE) ×6 IMPLANT
GLOVE SURG UNDER POLY LF SZ8 (GLOVE) ×2
GOWN STRL REUS W/TWL LRG LVL3 (GOWN DISPOSABLE) ×3 IMPLANT
GOWN STRL REUS W/TWL XL LVL3 (GOWN DISPOSABLE) ×3 IMPLANT
KIT TURNOVER KIT A (KITS) ×3 IMPLANT
LASER FIBER DISP 1000U (UROLOGICAL SUPPLIES) ×3 IMPLANT
MANIFOLD NEPTUNE II (INSTRUMENTS) ×3 IMPLANT
MARKER SKIN DUAL TIP RULER LAB (MISCELLANEOUS) ×1 IMPLANT
NS IRRIG 1000ML POUR BTL (IV SOLUTION) ×3 IMPLANT
PACK BASIC III (CUSTOM PROCEDURE TRAY) ×2
PACK SRG BSC III STRL LF ECLPS (CUSTOM PROCEDURE TRAY) ×2 IMPLANT
PAD ARMBOARD 7.5X6 YLW CONV (MISCELLANEOUS) ×3 IMPLANT
PREFILTER SMOKE EVAC (FILTER) ×3 IMPLANT
SCOPETTES 8  STERILE (MISCELLANEOUS) ×2
SCOPETTES 8 STERILE (MISCELLANEOUS) ×2 IMPLANT
SET BASIN LINEN APH (SET/KITS/TRAYS/PACK) ×3 IMPLANT
SHEET LAVH (DRAPES) ×3 IMPLANT
TOWEL OR 17X26 4PK STRL BLUE (TOWEL DISPOSABLE) ×3 IMPLANT
TUBING SMOKE EVAC CO2 (TUBING) ×3 IMPLANT
WATER STERILE IRR 1000ML POUR (IV SOLUTION) ×3 IMPLANT

## 2021-12-30 NOTE — Op Note (Signed)
Preoperative Diagnosis:  High Grade Squamous Intraepithelial lesion, adequate colposcopy, multifocal disease, wide extension ? ?Postoperative Diagnosis:  Same as above ? ?Procedure:  Laser ablation of the cervix ? ?Surgeon:  Lazaro Arms MD ? ?Anaesthesia:  Laryngeal Mask Airway ? ?Findings:  Patient had an abnormal pap smear which was evaluated in the office with colposcopy and directed biopsies.  Pathology report returned as high Grade SIL.  The colposcopy was adequate.  As a result, the patient is admitted for laser ablation of the cervix. ? ?Description of Note:  Patient was taken to the OR and placed in the supine position where she underwent laryngeal mask airway anaesthesia.  She was placed in the dorsal lithotomy position.  She was draped for laser.  Graves speculum was placed and 3% acetic acid used and the laser microscope employed to perform colposcopy which confirmed the office findings.  Laser was used on typical cervical settings and used to vaporized the squamocolumnar junction to  depth of  5-7 mm peripherally and 7-9 mm centrally.  Surgical margin of several mm was employed beyond the acetowhite epithelium.  Hemostasis was achieved with the laser and Monsel's solution.  Patient was awakened from anaesthesia in good stable condition and all counts were correct.  She received Ancef 2 gram and Toradol 30 mg IV preoperatively prophylactically.  ? ?Lazaro Arms, MD ?12/30/2021 ?8:05 AM ? ?

## 2021-12-30 NOTE — Anesthesia Preprocedure Evaluation (Signed)
Anesthesia Evaluation  ?Patient identified by MRN, date of birth, ID band ?Patient awake ? ? ? ?Reviewed: ?Allergy & Precautions, NPO status , Patient's Chart, lab work & pertinent test results ? ?Airway ?Mallampati: II ? ?TM Distance: >3 FB ?Neck ROM: Full ? ? ? Dental ? ?(+) Dental Advisory Given, Missing, Chipped,  ?  ?Pulmonary ?sleep apnea (non complaint with BIPAP) , former smoker,  ?  ?Pulmonary exam normal ?breath sounds clear to auscultation ? ? ? ? ? ? Cardiovascular ?Exercise Tolerance: Good ?negative cardio ROS ?Normal cardiovascular exam ?Rhythm:Regular Rate:Normal ? ? ?  ?Neuro/Psych ? Headaches, Seizures -,  PSYCHIATRIC DISORDERS Anxiety Depression  Neuromuscular disease CVA   ? GI/Hepatic ?Neg liver ROS, hiatal hernia, GERD  Medicated and Controlled,  ?Endo/Other  ?negative endocrine ROS ? Renal/GU ?negative Renal ROS  ?negative genitourinary ?  ?Musculoskeletal ? ?(+) Arthritis , Osteoarthritis,  Fibromyalgia - ? Abdominal ?  ?Peds ?negative pediatric ROS ?(+)  Hematology ?negative hematology ROS ?(+)   ?Anesthesia Other Findings ? ? Reproductive/Obstetrics ?negative OB ROS ? ?  ? ? ? ? ? ? ? ? ? ? ? ? ? ?  ?  ? ? ? ? ? ? ? ?Anesthesia Physical ?Anesthesia Plan ? ?ASA: 3 ? ?Anesthesia Plan: General  ? ?Post-op Pain Management: Dilaudid IV  ? ?Induction: Intravenous ? ?PONV Risk Score and Plan: 4 or greater and Ondansetron, Dexamethasone, Midazolam and Metaclopromide ? ?Airway Management Planned: LMA ? ?Additional Equipment:  ? ?Intra-op Plan:  ? ?Post-operative Plan: Extubation in OR ? ?Informed Consent: I have reviewed the patients History and Physical, chart, labs and discussed the procedure including the risks, benefits and alternatives for the proposed anesthesia with the patient or authorized representative who has indicated his/her understanding and acceptance.  ? ? ? ?Dental advisory given ? ?Plan Discussed with: CRNA and Surgeon ? ?Anesthesia Plan Comments:    ? ? ? ? ? ?Anesthesia Quick Evaluation ? ?

## 2021-12-30 NOTE — Anesthesia Procedure Notes (Signed)
Procedure Name: LMA Insertion ?Date/Time: 12/30/2021 7:37 AM ?Performed by: Jonna Munro, CRNA ?Pre-anesthesia Checklist: Patient identified, Emergency Drugs available, Suction available, Patient being monitored and Timeout performed ?Patient Re-evaluated:Patient Re-evaluated prior to induction ?Oxygen Delivery Method: Circle system utilized ?Preoxygenation: Pre-oxygenation with 100% oxygen ?Induction Type: IV induction ?LMA: LMA inserted ?LMA Size: 4.0 ?Number of attempts: 1 ?Placement Confirmation: positive ETCO2 and breath sounds checked- equal and bilateral ?Tube secured with: Tape ?Dental Injury: Teeth and Oropharynx as per pre-operative assessment  ? ? ? ? ?

## 2021-12-30 NOTE — Transfer of Care (Signed)
Immediate Anesthesia Transfer of Care Note ? ?Patient: Mary Parker ? ?Procedure(s) Performed: LASER ABLATION of the cervix (Cervix) ? ?Patient Location: PACU ? ?Anesthesia Type:General ? ?Level of Consciousness: awake, alert , oriented and patient cooperative ? ?Airway & Oxygen Therapy: Patient Spontanous Breathing ? ?Post-op Assessment: Report given to RN, Post -op Vital signs reviewed and stable and Patient moving all extremities X 4 ? ?Post vital signs: Reviewed and stable ? ?Last Vitals:  ?Vitals Value Taken Time  ?BP    ?Temp    ?Pulse 65 12/30/21 0811  ?Resp 15 12/30/21 0811  ?SpO2 96 % 12/30/21 0811  ?Vitals shown include unvalidated device data. ? ?Last Pain:  ?Vitals:  ? 12/30/21 0653  ?TempSrc: Oral  ?PainSc: 0-No pain  ?   ? ?Patients Stated Pain Goal: 7 (12/30/21 3664) ? ?Complications: No notable events documented. ?

## 2021-12-30 NOTE — Anesthesia Postprocedure Evaluation (Signed)
Anesthesia Post Note ? ?Patient: KENNETHIA LYKES ? ?Procedure(s) Performed: LASER ABLATION of the cervix (Cervix) ? ?Patient location during evaluation: Phase II ?Anesthesia Type: General ?Level of consciousness: awake and alert and oriented ?Pain management: pain level controlled ?Vital Signs Assessment: post-procedure vital signs reviewed and stable ?Respiratory status: spontaneous breathing, nonlabored ventilation and respiratory function stable ?Cardiovascular status: blood pressure returned to baseline and stable ?Postop Assessment: no apparent nausea or vomiting ?Anesthetic complications: no ? ? ?No notable events documented. ? ? ?Last Vitals:  ?Vitals:  ? 12/30/21 0815 12/30/21 0832  ?BP: 113/70 130/90  ?Pulse: 62 60  ?Resp: 12 14  ?Temp:  36.6 ?C  ?SpO2: 97% 100%  ?  ?Last Pain:  ?Vitals:  ? 12/30/21 0832  ?TempSrc: Oral  ?PainSc: 0-No pain  ? ? ?  ?  ?  ?  ?  ?  ? ?Kazaria Gaertner C Luellen Howson ? ? ? ? ?

## 2021-12-30 NOTE — H&P (Signed)
Preoperative History and Physical  Mary Parker is a 41 y.o. [phone] with No LMP recorded. Patient has had a hysterectomy. admitted for a laser ablation of the cervix for multifocal/large lesion high grade cervical dysplasia.    PMH:    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/2021   11/09/21 needs colposcopy   IBS (irritable bowel syndrome)    Knee pain, right    Migraines    Rebound headache 12/19/2014   Right ovarian cyst 04/25/2017   Complex, check CA 125 and repeat US in 6 weeks    Sciatica of left side    Seizures (HCC)    remote past, felt it was related to Chantix    Sleep apnea    Stroke (HCC)    migraine induced stroke   Vitamin D deficiency    Wears glasses     PSH:     Past Surgical History:  Procedure Laterality Date   BILATERAL SALPINGECTOMY Bilateral 06/21/2017   Procedure: BILATERAL SALPINGECTOMY;  Surgeon: Tilda Burrow, MD;  Location: [facility];  Service: Gynecology;  Laterality: Bilateral;   CESAREAN SECTION  [phone]   CHOLECYSTECTOMY  2001   COLONOSCOPY WITH PROPOFOL N/A 04/07/2017   Dr. Lovena Neighbours: Normal, abnormal CT likely artifactual   ENDOMETRIAL ABLATION     ESOPHAGOGASTRODUODENOSCOPY  2002   [facility]: normal   KNEE ARTHROSCOPY WITH LATERAL RELEASE Right 04/18/2018   Procedure: Right knee arthroscopy with chondroplasty and lateral release;  Surgeon: Yolonda Kida, MD;  Location: [facility];  Service: Orthopedics;  Laterality: Right;  60 mins   SUPRACERVICAL ABDOMINAL  HYSTERECTOMY N/A 06/21/2017   Procedure: HYSTERECTOMY SUPRACERVICAL ABDOMINAL;  Surgeon: Tilda Burrow, MD;  Location: [facility];  Service: Gynecology;  Laterality: N/A;   TUBAL LIGATION      POb/GynH:      OB History     Gravida  3   Para  3   Term  3   Preterm      AB      Living  3      SAB      IAB      Ectopic      Multiple      Live Births  3           SH:   Social History   Tobacco Use   Smoking status: Former    Packs/day: 0.25    Years: 25.00    Pack years: 6.25    Types: Cigarettes    Quit date: 04/02/2019    Years since quitting: 2.7   Smokeless tobacco: Never  Vaping Use   Vaping Use: Never used  Substance Use Topics   Alcohol use: No   Drug use: No    FH:    Family History  Problem Relation Age of Onset   Cancer Maternal Grandmother    Osteoporosis Maternal Grandmother    Cancer Maternal Grandfather    Diabetes Father  Hypertension Father    Alcohol abuse Father    Breast cancer Mother    Hypertension Mother    Other Mother        abnormal cells; had hyst   Obesity Daughter    Diabetes Daughter [phone]       youngest   Obesity Daughter    Obesity Son    Colon cancer Neg Hx      Allergies:  Allergies  Allergen Reactions   Bupropion Other (See Comments)    Suicidal ideation    Duloxetine Hcl Hives, Itching and Rash   Gabapentin Itching   Meloxicam Other (See Comments)    Abdominal cramping and mood changes   Pregabalin Other (See Comments)    Caused TREMORS    Amitriptyline Other (See Comments)    Allergy occurred awhile back- reaction not recalled   Ciprofloxacin Hives, Swelling and Other (See Comments)    Sweating and  Dizziness, also   Desvenlafaxine Succinate Er Other (See Comments)    "Made me feel like I couldn't urinate"   Hydrocodone-Acetaminophen Swelling   Milnacipran Nausea Only and Other (See Comments)    Dizziness and leg cramps, also    Nortriptyline Nausea And Vomiting and Other (See Comments)     GERD, also   Topiramate Swelling and Other (See Comments)    Dizziness and slurred speech, also   Decadron [Dexamethasone] Other (See Comments)    Flushing, dizziness, and the patient PASSED OUT   Prednisone     Tablets- caused black/pass out.  Ok when used Blister pack per pt.   Doxycycline Nausea Only   Percocet [Oxycodone-Acetaminophen] Nausea Only    Medications:       Current Facility-Administered Medications:    ceFAZolin (ANCEF) IVPB 2g/100 mL premix, 2 g, Intravenous, On Call to OR, Lazaro ArmsEure, Vonya Ohalloran H, MD   lactated ringers infusion, , Intravenous, Continuous, Battula, Rajamani C, MD, Last Rate: 50 mL/hr at 12/30/21 0716, New Bag at 12/30/21 0716   povidone-iodine 10 % swab 2 application, 2 application, Topical, Once, Lazaro ArmsEure, Emerson Schreifels H, MD  Review of Systems:   Review of Systems  Constitutional: Negative for fever, chills, weight loss, malaise/fatigue and diaphoresis.  HENT: Negative for hearing loss, ear pain, nosebleeds, congestion, sore throat, neck pain, tinnitus and ear discharge.   Eyes: Negative for blurred vision, double vision, photophobia, pain, discharge and redness.  Respiratory: Negative for cough, hemoptysis, sputum production, shortness of breath, wheezing and stridor.   Cardiovascular: Negative for chest pain, palpitations, orthopnea, claudication, leg swelling and PND.  Gastrointestinal: Positive for abdominal pain. Negative for heartburn, nausea, vomiting, diarrhea, constipation, blood in stool and melena.  Genitourinary: Negative for dysuria, urgency, frequency, hematuria and flank pain.  Musculoskeletal: Negative for myalgias, back pain, joint pain and falls.  Skin: Negative for itching and rash.  Neurological: Negative for dizziness, tingling, tremors, sensory change, speech change, focal weakness, seizures, loss of consciousness, weakness and headaches.  Endo/Heme/Allergies: Negative for environmental allergies and polydipsia. Does not bruise/bleed easily.   Psychiatric/Behavioral: Negative for depression, suicidal ideas, hallucinations, memory loss and substance abuse. The patient is not nervous/anxious and does not have insomnia.      PHYSICAL EXAM:  Blood pressure (!) 104/58, pulse 65, temperature 97.6 F (36.4 C), temperature source Oral, resp. rate 18, height 5\' 4"  (1.626 m), weight 91.2 kg, SpO2 99 %.    Vitals reviewed. Constitutional: She is oriented to person, place, and time. She appears well-developed and well-nourished.  HENT:  Head: Normocephalic and atraumatic.  Right Ear: External ear normal.  Left Ear: External ear normal.  Nose: Nose normal.  Mouth/Throat: Oropharynx is clear and moist.  Eyes: Conjunctivae and EOM are normal. Pupils are equal, round, and reactive to light. Right eye exhibits no discharge. Left eye exhibits no discharge. No scleral icterus.  Neck: Normal range of motion. Neck supple. No tracheal deviation present. No thyromegaly present.  Cardiovascular: Normal rate, regular rhythm, normal heart sounds and intact distal pulses.  Exam reveals no gallop and no friction rub.   No murmur heard. Respiratory: Effort normal and breath sounds normal. No respiratory distress. She has no wheezes. She has no rales. She exhibits no tenderness.  GI: Soft. Bowel sounds are normal. She exhibits no distension and no mass. There is tenderness. There is no rebound and no guarding.  Genitourinary:       Vulva is normal without lesions Vagina is pink moist without discharge Cervix per colpo Uterus is normal size, contour, position, consistency, mobility, non-tender Adnexa is negative with normal sized ovaries by sonogram  Musculoskeletal: Normal range of motion. She exhibits no edema and no tenderness.  Neurological: She is alert and oriented to person, place, and time. She has normal reflexes. She displays normal reflexes. No cranial nerve deficit. She exhibits normal muscle tone. Coordination normal.  Skin: Skin is warm  and dry. No rash noted. No erythema. No pallor.  Psychiatric: She has a normal mood and affect. Her behavior is normal. Judgment and thought content normal.    Labs: Results for orders placed or performed during the hospital encounter of 12/28/21 (from the past 336 hour(s))  CBC   Collection Time: 12/28/21 10:24 AM  Result Value Ref Range   WBC 6.3 4.0 - 10.5 K/uL   RBC 3.85 (L) 3.87 - 5.11 MIL/uL   Hemoglobin 11.9 (L) 12.0 - 15.0 g/dL   HCT 40.9 81.1 - 91.4 %   MCV 97.7 80.0 - 100.0 fL   MCH 30.9 26.0 - 34.0 pg   MCHC 31.6 30.0 - 36.0 g/dL   RDW 78.2 95.6 - 21.3 %   Platelets 192 150 - 400 K/uL   nRBC 0.0 0.0 - 0.2 %  Comprehensive metabolic panel   Collection Time: 12/28/21 10:24 AM  Result Value Ref Range   Sodium 135 135 - 145 mmol/L   Potassium 3.4 (L) 3.5 - 5.1 mmol/L   Chloride 104 98 - 111 mmol/L   CO2 27 22 - 32 mmol/L   Glucose, Bld 77 70 - 99 mg/dL   BUN 6 6 - 20 mg/dL   Creatinine, Ser 0.86 0.44 - 1.00 mg/dL   Calcium 8.5 (L) 8.9 - 10.3 mg/dL   Total Protein 6.3 (L) 6.5 - 8.1 g/dL   Albumin 3.4 (L) 3.5 - 5.0 g/dL   AST 13 (L) 15 - 41 U/L   ALT 13 0 - 44 U/L   Alkaline Phosphatase 53 38 - 126 U/L   Total Bilirubin 0.3 0.3 - 1.2 mg/dL   GFR, Estimated >57 >84 mL/min   Anion gap 4 (L) 5 - 15  hCG, quantitative, pregnancy   Collection Time: 12/28/21 10:24 AM  Result Value Ref Range   hCG, Beta Chain, Quant, S <1 <5 mIU/mL  Rapid HIV screen (HIV 1/2 Ab+Ag)   Collection Time: 12/28/21 10:24 AM  Result Value Ref Range   HIV-1 P24 Antigen - HIV24 NON REACTIVE NON REACTIVE   HIV 1/2 Antibodies NON REACTIVE NON REACTIVE   Interpretation (HIV Ag Ab)  A non reactive test result means that HIV 1 or HIV 2 antibodies and HIV 1 p24 antigen were not detected in the specimen.  Urinalysis, Routine w reflex microscopic Urine, Clean Catch   Collection Time: 12/28/21 10:24 AM  Result Value Ref Range   Color, Urine YELLOW YELLOW   APPearance HAZY (A) CLEAR   Specific  Gravity, Urine 1.018 1.005 - 1.030   pH 5.0 5.0 - 8.0   Glucose, UA NEGATIVE NEGATIVE mg/dL   Hgb urine dipstick NEGATIVE NEGATIVE   Bilirubin Urine NEGATIVE NEGATIVE   Ketones, ur NEGATIVE NEGATIVE mg/dL   Protein, ur NEGATIVE NEGATIVE mg/dL   Nitrite NEGATIVE NEGATIVE   Leukocytes,Ua LARGE (A) NEGATIVE   RBC / HPF 0-5 0 - 5 RBC/hpf   WBC, UA 6-10 0 - 5 WBC/hpf   Bacteria, UA RARE (A) NONE SEEN   Squamous Epithelial / LPF 11-20 0 - 5   Mucus PRESENT     EKG: Orders placed or performed in visit on 01/12/21   EKG 12-Lead    Imaging Studies: No results found.    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

## 2022-01-04 ENCOUNTER — Encounter (HOSPITAL_COMMUNITY): Payer: Self-pay | Admitting: Obstetrics & Gynecology

## 2022-01-08 ENCOUNTER — Encounter: Payer: BC Managed Care – PPO | Admitting: Obstetrics & Gynecology

## 2022-01-08 ENCOUNTER — Ambulatory Visit (INDEPENDENT_AMBULATORY_CARE_PROVIDER_SITE_OTHER): Payer: BC Managed Care – PPO | Admitting: Obstetrics & Gynecology

## 2022-01-08 ENCOUNTER — Other Ambulatory Visit: Payer: Self-pay

## 2022-01-08 ENCOUNTER — Encounter: Payer: Self-pay | Admitting: Obstetrics & Gynecology

## 2022-01-08 VITALS — BP 106/73 | HR 76 | Ht 62.0 in | Wt 201.0 lb

## 2022-01-08 DIAGNOSIS — Z9889 Other specified postprocedural states: Secondary | ICD-10-CM

## 2022-01-08 NOTE — Progress Notes (Signed)
?  HPI: ?Patient returns for routine postoperative follow-up having undergone laser ablation of the cervix on 12/30/21.  ?The patient's immediate postoperative recovery has been unremarkable. ?Since hospital discharge the patient reports no problems. ? ? ?Current Outpatient Medications: ?ALPRAZolam (XANAX) 1 MG tablet, Take 1 mg by mouth 2 (two) times daily., Disp: , Rfl:  ?budesonide-formoterol (SYMBICORT) 160-4.5 MCG/ACT inhaler, Inhale 2 puffs into the lungs 2 (two) times daily as needed (for flares)., Disp: , Rfl:  ?cyanocobalamin (,VITAMIN B-12,) 1000 MCG/ML injection, Inject 1,000 mcg into the skin every 30 (thirty) days. , Disp: , Rfl: 0 ?dicyclomine (BENTYL) 10 MG capsule, TAKE 1 CAPSULE BY MOUTH UP TO THREE TIMES DAILY AS NEEDED FOR SPASMS OR ABDOMINAL PAIN, Disp: 120 capsule, Rfl: 1 ?escitalopram (LEXAPRO) 10 MG tablet, Take 10 mg by mouth at bedtime., Disp: , Rfl: 0 ?indomethacin (INDOCIN) 25 MG capsule, Take 25 mg by mouth daily as needed (migraine)., Disp: , Rfl:  ?ketorolac (TORADOL) 10 MG tablet, Take 1 tablet (10 mg total) by mouth every 8 (eight) hours as needed., Disp: 15 tablet, Rfl: 0 ?MELATONIN PO, Take 1 tablet by mouth at bedtime., Disp: , Rfl:  ?omeprazole (PRILOSEC) 20 MG capsule, Take 20 mg by mouth daily., Disp: , Rfl:  ?ondansetron (ZOFRAN-ODT) 4 MG disintegrating tablet, DISSOLVE 1 TABLET(4 MG) ON THE TONGUE EVERY 8 HOURS AS NEEDED FOR NAUSEA OR VOMITING, Disp: 30 tablet, Rfl: 6 ?ondansetron (ZOFRAN-ODT) 8 MG disintegrating tablet, Take 1 tablet (8 mg total) by mouth every 8 (eight) hours as needed for nausea or vomiting., Disp: 8 tablet, Rfl: 0 ?tiZANidine (ZANAFLEX) 2 MG tablet, Take 1 tablet (2 mg total) by mouth at bedtime., Disp: , Rfl:  ?TRUDHESA 0.725 MG/ACT AERS, Place 2 sprays into both nostrils as needed (migraine)., Disp: , Rfl:  ?Ubrogepant (UBRELVY) 100 MG TABS, Take 100 mg by mouth as needed (take 1 tablet at onset of headache, may repeat 2 hours after initial dose, max is 200  mg/24 hours)., Disp: 12 tablet, Rfl: 11 ?Vitamin D, Ergocalciferol, (DRISDOL) 50000 units CAPS capsule, Take 50,000 Units by mouth every Sunday. , Disp: , Rfl: 0 ? ?No current facility-administered medications for this visit. ? ? ? ?Blood pressure 106/73, pulse 76, height 5\' 2"  (1.575 m), weight 201 lb (91.2 kg). ? ?Physical Exam: ?Cervix healing appropriately post ablation ? ?Diagnostic Tests: ? ? ?Pathology: ?HSIL ? ?Impression: ?1. S/P laser ablation of the uterus for HSIL ? ?  ? ? ?Plan: ?Cytology only every 6 months for 2 years ? ? ? ?Follow up: ?No follow-ups on file. ? ? ? , MD ? ?

## 2022-01-19 ENCOUNTER — Encounter: Payer: Self-pay | Admitting: Internal Medicine

## 2022-01-19 ENCOUNTER — Encounter: Payer: BC Managed Care – PPO | Admitting: Gastroenterology

## 2022-02-25 DIAGNOSIS — I10 Essential (primary) hypertension: Secondary | ICD-10-CM | POA: Diagnosis not present

## 2022-02-25 DIAGNOSIS — E538 Deficiency of other specified B group vitamins: Secondary | ICD-10-CM | POA: Diagnosis not present

## 2022-04-08 NOTE — Telephone Encounter (Signed)
Mary Parker, patient needs urgent office visit tomorrow or Monday to evaluate rectum.

## 2022-04-08 NOTE — Progress Notes (Signed)
Referring Provider: Benita Stabile, MD Primary Care Physician:  Benita Stabile, MD Primary GI Physician: Dr. Jena Gauss  Chief Complaint  Patient presents with   Hemorrhoids    Hemorrhoids are very painful.     HPI:   Mary Parker is a 41 y.o. female presenting today for an acute visit due to perirectal pain.  She has known history of grade 2 hemorrhoids, unresponsive to maximal medical therapy, and underwent hemorrhoid banding of left lateral internal hemorrhoid on 11/25/2021.  Patient message on 6/8 with patient reporting hard, pea-sized, suspected hemorrhoid on the outside of her buttock that was causing significant pain.  She was using hemorrhoid cream, but was almost out.  Stated she had soaked in Epsom salt, but nothing was helping.  Recommended supportive care, ER evaluation of severe pain, and office visit to at least be looked at.  Today: Rectal pain started 3 days ago. First noticed a hard pea sized bump at her rectum. Today feels like size of her thumb. Excruciating pain. Using hemorrhoid cream 3 times a day. Epsom salt baths twice a day. No improvement yet. Little toilet tissue hematochezia the first day. No BM in 2 days. Has IBS. Bowels fluctuate from normal, occasional diarrhea, rare constipation. Here recently, bowels had been moving very well, soft, formed, stools. Prior to symptom onset, she had been going into work early and reports she does a lot of sitting and wondered if this may have flared her symptoms.      Past Medical History:  Diagnosis Date   Anxiety    Arthritis    both knees   Bell's palsy    migraine induced   Body aches 12/11/2014   Chronic insomnia    Chronic low back pain 05/29/2015   Common migraine 12/19/2014   migraine induced stroke   Depression 12/11/2014   Fibrocystic disease of both breasts    Fibromyalgia 12/19/2014   GERD (gastroesophageal reflux disease)    History of hiatal hernia    HSIL (high grade squamous intraepithelial lesion) on  Pap smear of cervix 11/09/2021   11/09/21 needs colposcopy   IBS (irritable bowel syndrome)    Knee pain, right    Migraines    Rebound headache 12/19/2014   Right ovarian cyst 04/25/2017   Complex, check CA 125 and repeat US in 6 weeks    Sciatica of left side    Seizures (HCC)    remote past, felt it was related to Chantix    Sleep apnea    Stroke [facility])    migraine induced stroke   Vitamin D deficiency    Wears glasses     Past Surgical History:  Procedure Laterality Date   BILATERAL SALPINGECTOMY Bilateral 06/21/2017   Procedure: BILATERAL SALPINGECTOMY;  Surgeon: Tilda Burrow, MD;  Location: [facility];  Service: Gynecology;  Laterality: Bilateral;   CESAREAN SECTION  [phone]   CHOLECYSTECTOMY  2001   COLONOSCOPY WITH PROPOFOL N/A 04/07/2017   Dr. Lovena Neighbours: Normal, abnormal CT likely artifactual   ENDOMETRIAL ABLATION     ESOPHAGOGASTRODUODENOSCOPY  2002   [facility]: normal   KNEE ARTHROSCOPY WITH LATERAL RELEASE Right 04/18/2018   Procedure: Right knee arthroscopy with chondroplasty and lateral release;  Surgeon: Yolonda Kida, MD;  Location: [facility];  Service: Orthopedics;  Laterality: Right;  60 mins   LASER ABLATION CONDOLAMATA N/A 12/30/2021   Procedure: LASER ABLATION of the cervix;  Surgeon: Lazaro Arms, MD;  Location: [facility];  Service: Gynecology;  Laterality: N/A;   SUPRACERVICAL ABDOMINAL HYSTERECTOMY N/A 06/21/2017   Procedure: HYSTERECTOMY SUPRACERVICAL ABDOMINAL;  Surgeon: Jonnie Kind, MD;  Location: [facility];  Service: Gynecology;  Laterality: N/A;   TUBAL LIGATION      Current Outpatient Medications  Medication Sig Dispense Refill   ALPRAZolam (XANAX) 1 MG tablet Take 1 mg by mouth 2 (two) times daily.     budesonide-formoterol (SYMBICORT) 160-4.5 MCG/ACT inhaler Inhale 2 puffs into the lungs 2 (two) times daily as needed (for flares).     cyanocobalamin (,VITAMIN B-12,) 1000 MCG/ML injection Inject 1,000 mcg into the skin  every 30 (thirty) days.   0   dicyclomine (BENTYL) 10 MG capsule TAKE 1 CAPSULE BY MOUTH UP TO THREE TIMES DAILY AS NEEDED FOR SPASMS OR ABDOMINAL PAIN 120 capsule 1   escitalopram (LEXAPRO) 10 MG tablet Take 10 mg by mouth at bedtime.  0   indomethacin (INDOCIN) 25 MG capsule Take 25 mg by mouth daily as needed (migraine).     ketorolac (TORADOL) 10 MG tablet Take 1 tablet (10 mg total) by mouth every 8 (eight) hours as needed. 15 tablet 0   MELATONIN PO Take 1 tablet by mouth at bedtime.     omeprazole (PRILOSEC) 20 MG capsule Take 20 mg by mouth daily.     ondansetron (ZOFRAN-ODT) 4 MG disintegrating tablet DISSOLVE 1 TABLET(4 MG) ON THE TONGUE EVERY 8 HOURS AS NEEDED FOR NAUSEA OR VOMITING 30 tablet 6   ondansetron (ZOFRAN-ODT) 8 MG disintegrating tablet Take 1 tablet (8 mg total) by mouth every 8 (eight) hours as needed for nausea or vomiting. 8 tablet 0   tiZANidine (ZANAFLEX) 2 MG tablet Take 1 tablet (2 mg total) by mouth at bedtime.     TRUDHESA 0.725 MG/ACT AERS Place 2 sprays into both nostrils as needed (migraine).     Ubrogepant (UBRELVY) 100 MG TABS Take 100 mg by mouth as needed (take 1 tablet at onset of headache, may repeat 2 hours after initial dose, max is 200 mg/24 hours). 12 tablet 11   Vitamin D, Ergocalciferol, (DRISDOL) 50000 units CAPS capsule Take 50,000 Units by mouth every Sunday.   0   No current facility-administered medications for this visit.    Allergies as of 04/09/2022 - Review Complete 04/09/2022  Allergen Reaction Noted   Bupropion Other (See Comments) 05/07/2015   Duloxetine hcl Hives, Itching, and Rash 04/04/2017   Gabapentin Itching 03/03/2017   Meloxicam Other (See Comments) 05/28/2016   Pregabalin Other (See Comments) 04/04/2017   Amitriptyline Other (See Comments) 10/23/2018   Ciprofloxacin Hives, Swelling, and Other (See Comments) 12/11/2014   Desvenlafaxine succinate er Other (See Comments) 11/09/2017   Hydrocodone-acetaminophen Swelling  04/18/2018   Milnacipran Nausea Only and Other (See Comments) 06/15/2017   Nortriptyline Nausea And Vomiting and Other (See Comments) 04/01/2015   Topiramate Swelling and Other (See Comments) 04/01/2015   Decadron [dexamethasone] Other (See Comments) 03/05/2019   Prednisone  12/15/2020   Doxycycline Nausea Only 06/29/2017   Percocet [oxycodone-acetaminophen] Nausea Only 06/29/2017    Family History  Problem Relation Age of Onset   Cancer Maternal Grandmother    Osteoporosis Maternal Grandmother    Cancer Maternal Grandfather    Diabetes Father    Hypertension Father    Alcohol abuse Father    Breast cancer Mother    Hypertension Mother    Other Mother        abnormal cells; had hyst   Obesity Daughter  Diabetes Daughter 74       youngest   Obesity Daughter    Obesity Son    Colon cancer Neg Hx     Social History   Socioeconomic History   Marital status: Divorced    Spouse name: Not on file   Number of children: 3   Years of education: HS   Highest education level: Not on file  Occupational History   Occupation: [employer]  Tobacco Use   Smoking status: Former    Packs/day: 0.25    Years: 25.00    Total pack years: 6.25    Types: Cigarettes    Quit date: 04/02/2019    Years since quitting: 3.0   Smokeless tobacco: Never  Vaping Use   Vaping Use: Never used  Substance and Sexual Activity   Alcohol use: No   Drug use: No   Sexual activity: Not Currently    Birth control/protection: Surgical    Comment: supracervical hyst and tubal  Other Topics Concern   Not on file  Social History Narrative   Patient is right handed.   Patient drinks 1-2 cups of caffeine dailyy.   Patient lives mom stepdad, and children.   Social Determinants of Health   Financial Resource Strain: Low Risk  (11/04/2021)   Overall Financial Resource Strain (CARDIA)    Difficulty of Paying Living Expenses: Not very hard  Food Insecurity: Food Insecurity Present (11/04/2021)   Hunger  Vital Sign    Worried About Running Out of Food in the Last Year: Sometimes true    Ran Out of Food in the Last Year: Sometimes true  Transportation Needs: No Transportation Needs (11/04/2021)   PRAPARE - Hydrologist (Medical): No    Lack of Transportation (Non-Medical): No  Physical Activity: Inactive (11/04/2021)   Exercise Vital Sign    Days of Exercise per Week: 0 days    Minutes of Exercise per Session: 0 min  Stress: Stress Concern Present (11/04/2021)   [place]    Feeling of Stress : Very much  Social Connections: Socially Isolated (11/04/2021)   Social Connection and Isolation Panel [NHANES]    Frequency of Communication with Friends and Family: Once a week    Frequency of Social Gatherings with Friends and Family: Once a week    Attends Religious Services: Never    Marine scientist or Organizations: No    Attends Music therapist: Never    Marital Status: Divorced    Review of Systems: Gen: Denies fever, chills, pre-syncope, syncope.  GI: See HPI Heme: See HPI  Physical Exam: BP 120/64   Pulse 96   Temp 97.7 F (36.5 C) (Temporal)   Ht 5\' 2"  (1.575 m)   Wt 191 lb 3.2 oz (86.7 kg)   BMI 34.97 kg/m  General:   Alert and oriented. Pleasant and cooperative. Unable to sit in exam chair due to rectal pain, laying on exam table on her side.  Head:  Normocephalic and atraumatic. Rectal: Tender, thrombosed external hemorrhoid.  Neurologic:  Alert and  oriented x4 Psych:  Normal mood and affect.    Assessment:  41 y.o. female with known history of hemorrhoids, presenting today for an acute visit due to perirectal pain x 3 days, found to have thrombosed external hemorrhoid on rectal exam. She had been sitting at work for longer periods of time over the last week and wonders if this  may have contributed to her flare. Bowels had been moving very well recently though  no BM now in 2 days.   We discussed that management of thrombosed hemorrhoids is primarily supportive. She has [facility] Hemorrhoid Cream compounded with lidocaine (confirmed with pharmacist today). Advised she use this 3 times daily, sitz baths 3 times daily, limit toilet time, and use colace and/or miralax to keep stools soft and prevent straining during this time.  She is aware that it can take up to a week or so for symptoms to completely resolve, but was advised to call back if any persistent or worsening symptoms.  She is also aware to proceed to the emergency room if severe symptoms.   Plan:  [facility] hemorrhoid cream compounded with lidocaine 3 times daily. Sitz bath's 3 times daily. Limit toilet time to 2-3 minutes. Avoid straining. If having trouble with mild constipation, take Colace 100-200 mg daily.  May also try MiraLAX 17 g daily in 8 ounces of water. Patient will call back if any persistent or worsening symptoms. Emergency room evaluation for any severe symptoms. We provided a work note for the next 2 days. She will call back if she needs additional leave from work.   Aliene Altes, PA-C [facility] 04/09/2022

## 2022-04-09 ENCOUNTER — Ambulatory Visit (INDEPENDENT_AMBULATORY_CARE_PROVIDER_SITE_OTHER): Payer: BC Managed Care – PPO | Admitting: Gastroenterology

## 2022-04-09 ENCOUNTER — Encounter: Payer: Self-pay | Admitting: Gastroenterology

## 2022-04-09 ENCOUNTER — Encounter: Payer: Self-pay | Admitting: *Deleted

## 2022-04-09 VITALS — BP 120/64 | HR 96 | Temp 97.7°F | Ht 62.0 in | Wt 191.2 lb

## 2022-04-09 DIAGNOSIS — K645 Perianal venous thrombosis: Secondary | ICD-10-CM

## 2022-04-09 NOTE — Patient Instructions (Addendum)
You have a thrombosed hemorrhoid meaning there is a small clot of blood inside the hemorrhoid vain. We will have to let your body resorb this clot on its own and treat supportively. It may take a week or so for your symptoms to completely resolve.   Continue using [facility] Hemorrhoid Cream 3 times daily.   Continue sitz baths 3 times daily.   Limit toilet time to 2-3 minutes.   Avoid straining.   If you are having trouble having bowel movements, try taking colace 100-200 mg daily. You can also try MiraLAX 17g daily in 8 oz of water.   Call back with any persistent or worsening symptoms.   Aliene Altes, PA-C [facility]

## 2022-04-12 ENCOUNTER — Emergency Department (HOSPITAL_COMMUNITY)
Admission: EM | Admit: 2022-04-12 | Discharge: 2022-04-12 | Disposition: A | Discharge status: Home / Self Care | Payer: BC Managed Care – PPO | Attending: Emergency Medicine | Admitting: Emergency Medicine

## 2022-04-12 ENCOUNTER — Encounter (HOSPITAL_COMMUNITY): Payer: Self-pay | Admitting: *Deleted

## 2022-04-12 ENCOUNTER — Telehealth: Payer: Self-pay

## 2022-04-12 ENCOUNTER — Other Ambulatory Visit: Payer: Self-pay

## 2022-04-12 DIAGNOSIS — K649 Unspecified hemorrhoids: Secondary | ICD-10-CM | POA: Diagnosis not present

## 2022-04-12 MED ORDER — IBUPROFEN 400 MG PO TABS
600.0000 mg | ORAL_TABLET | Freq: Once | ORAL | Status: DC
Start: 2022-04-12 — End: 2022-04-13

## 2022-04-12 NOTE — ED Notes (Signed)
Dc instructions reviewed with pt no questions at this time.

## 2022-04-12 NOTE — Telephone Encounter (Signed)
Pt seen Baxter Hire on 04/09/2022 message states she is still having issues to please call.

## 2022-04-12 NOTE — Discharge Instructions (Signed)
Continue your topical medications per your gastroenterologist.  Follow-up in surgical clinic for additional care.  Return to the ER if you have worsening symptoms fevers vomiting or any additional concerns.

## 2022-04-12 NOTE — ED Triage Notes (Signed)
Pt states she has a thrombosed hemorrhoid and she was seen on Friday for the same at her doctor, they gave her some cream to use. Pt says the pain has increased in the rectal and left buttocks area. Reports "minor, trace amount of bleeding"

## 2022-04-12 NOTE — ED Provider Notes (Signed)
[facility] EMERGENCY DEPARTMENT Provider Note   CSN: [id number] Arrival date & time: 04/12/22  1907     History  Chief Complaint  Patient presents with   Hemorrhoids    Mary Parker is a 41 y.o. female.  Patient with history of hemorrhoids presents with exacerbation of hemorrhoidal pain ongoing for the past 5 days.  She was seen at outside clinic and given topical steroid no creams and suppositories which she has been using.  Has persistent pain and presents to the ER.  Denies fevers or cough denies vomiting or diarrhea.       Home Medications Prior to Admission medications   Medication Sig Start Date End Date Taking? Authorizing Provider  ALPRAZolam Prudy Feeler) 1 MG tablet Take 1 mg by mouth 2 (two) times daily. 09/21/21   [provider]  budesonide-formoterol (SYMBICORT) 160-4.5 MCG/ACT inhaler Inhale 2 puffs into the lungs 2 (two) times daily as needed (for flares).    [provider]  cyanocobalamin (,VITAMIN B-12,) 1000 MCG/ML injection Inject 1,000 mcg into the skin every 30 (thirty) days.  02/17/18   [provider]  dicyclomine (BENTYL) 10 MG capsule TAKE 1 CAPSULE BY MOUTH UP TO THREE TIMES DAILY AS NEEDED FOR SPASMS OR ABDOMINAL PAIN 07/16/21   Tiffany Kocher, PA-C  escitalopram (LEXAPRO) 10 MG tablet Take 10 mg by mouth at bedtime. 10/31/17   [provider]  indomethacin (INDOCIN) 25 MG capsule Take 25 mg by mouth daily as needed (migraine). 09/10/21   [provider]  ketorolac (TORADOL) 10 MG tablet Take 1 tablet (10 mg total) by mouth every 8 (eight) hours as needed. 12/30/21   Lazaro Arms, MD  MELATONIN PO Take 1 tablet by mouth at bedtime.    [provider]  omeprazole (PRILOSEC) 20 MG capsule Take 20 mg by mouth daily. 12/18/21   [provider]  ondansetron (ZOFRAN-ODT) 4 MG disintegrating tablet DISSOLVE 1 TABLET(4 MG) ON THE TONGUE EVERY 8 HOURS AS NEEDED FOR NAUSEA OR VOMITING 07/21/21   York Spaniel, MD  ondansetron (ZOFRAN-ODT) 8 MG disintegrating tablet Take 1 tablet (8 mg total) by mouth every 8 (eight) hours as needed for nausea or vomiting. 12/30/21   Lazaro Arms, MD  tiZANidine (ZANAFLEX) 2 MG tablet Take 1 tablet (2 mg total) by mouth at bedtime. 05/04/19   Layne Benton, NP  TRUDHESA 0.725 MG/ACT AERS Place 2 sprays into both nostrils as needed (migraine). 09/01/21   [provider]  Ubrogepant (UBRELVY) 100 MG TABS Take 100 mg by mouth as needed (take 1 tablet at onset of headache, may repeat 2 hours after initial dose, max is 200 mg/24 hours). 12/11/20   Glean Salvo, NP  Vitamin D, Ergocalciferol, (DRISDOL) 50000 units CAPS capsule Take 50,000 Units by mouth every Sunday.  02/18/18   [provider]      Allergies    Bupropion, Duloxetine hcl, Gabapentin, Meloxicam, Pregabalin, Amitriptyline, Ciprofloxacin, Desvenlafaxine succinate er, Hydrocodone-acetaminophen, Milnacipran, Nortriptyline, Topiramate, Decadron [dexamethasone], Prednisone, Doxycycline, and Percocet [oxycodone-acetaminophen]    Review of Systems   Review of Systems  Constitutional:  Negative for fever.  HENT:  Negative for ear pain.   Eyes:  Negative for pain.  Respiratory:  Negative for cough.   Cardiovascular:  Negative for chest pain.  Gastrointestinal:  Negative for abdominal pain.  Genitourinary:  Negative for flank pain.  Musculoskeletal:  Negative for back pain.  Skin:  Negative for rash.  Neurological:  Negative for  headaches.    Physical Exam Updated Vital Signs BP (!) 151/86   Pulse 85   Temp 98.3 F (36.8 C) (Oral)   Resp 16   SpO2 99%  Physical Exam Constitutional:      General: She is not in acute distress.    Appearance: Normal appearance.  HENT:     Head: Normocephalic.     Nose: Nose normal.  Eyes:     Extraocular Movements: Extraocular movements intact.  Cardiovascular:     Rate and Rhythm: Normal rate.  Pulmonary:     Effort: Pulmonary effort is  normal.  Genitourinary:    Comments: Rectal hemorrhoid present, however feels soft.  Minimal to moderate tenderness on exam.   Musculoskeletal:        General: Normal range of motion.     Cervical back: Normal range of motion.  Neurological:     General: No focal deficit present.     Mental Status: She is alert. Mental status is at baseline.     ED Results / Procedures / Treatments   Labs (all labs ordered are listed, but only abnormal results are displayed) Labs Reviewed - No data to display  EKG None  Radiology No results found.  Procedures Procedures    Medications Ordered in ED Medications  ibuprofen (ADVIL) tablet 600 mg (has no administration in time range)    ED Course/ Medical Decision Making/ A&P                           Medical Decision Making Risk Prescription drug management.   Review of external records shows office visit April 09, 2022 for thrombosed hemorrhoid.  Cardiac monitoring showing sinus rhythm.  No additional diagnosis studies were done.  Vital signs within normal limits here.  Patient states that the hemorrhoid does feel softer than it initially presented several days ago.  She is concerned that it has not gone away.  Advised her that it may take some time.  Advised her to continue topical steroids as per her gastroenterologist.  Referral provided for surgical clinic.  Advised return if she has fevers or worsening symptoms otherwise follow-up in surgical clinic within the week.  Patient expressed understanding, discharged home in stable condition.        Final Clinical Impression(s) / ED Diagnoses Final diagnoses:  Hemorrhoids, unspecified hemorrhoid type    Rx / DC Orders ED Discharge Orders     None         Cheryll Cockayne, MD 04/12/22 2026

## 2022-04-13 ENCOUNTER — Telehealth: Payer: Self-pay | Admitting: *Deleted

## 2022-04-13 DIAGNOSIS — K649 Unspecified hemorrhoids: Secondary | ICD-10-CM

## 2022-04-13 NOTE — Telephone Encounter (Signed)
Spoke to pt, she informed me that she went to the ED yesterday, because of the pain from the thrombosed hemorrhoid. She states her left butt check has sharp pains and cramping. Would like to know what else she could do? She states she has been using the hemorrhoid cream and it is not helping.

## 2022-04-13 NOTE — Telephone Encounter (Signed)
Spoke to pt, she informed me that the General Surgery had already called her and made an appointment for next week. Informed to continue with supportive measures.

## 2022-04-13 NOTE — Addendum Note (Signed)
Addended by: Armstead Peaks on: 04/13/2022 12:15 PM   Modules accepted: Orders

## 2022-04-13 NOTE — Telephone Encounter (Signed)
Referral sent to [facility] 

## 2022-04-13 NOTE — Telephone Encounter (Signed)
Please refer to General Surgery due to hemorrhoid. I didn't see her in the office, so I can't attest to what it appeared to look like. Continue with supportive measures.

## 2022-04-13 NOTE — Telephone Encounter (Signed)
Spoke to pt, sent message to Lewie Loron, NP

## 2022-04-22 ENCOUNTER — Ambulatory Visit (INDEPENDENT_AMBULATORY_CARE_PROVIDER_SITE_OTHER): Payer: BC Managed Care – PPO | Admitting: General Surgery

## 2022-04-22 ENCOUNTER — Encounter: Payer: Self-pay | Admitting: General Surgery

## 2022-04-22 VITALS — BP 131/87 | HR 73 | Temp 97.7°F | Resp 14 | Ht 62.0 in | Wt 187.0 lb

## 2022-04-22 DIAGNOSIS — K648 Other hemorrhoids: Secondary | ICD-10-CM

## 2022-05-31 ENCOUNTER — Ambulatory Visit: Payer: Self-pay

## 2022-05-31 ENCOUNTER — Ambulatory Visit
Admission: EM | Admit: 2022-05-31 | Discharge: 2022-05-31 | Disposition: A | Discharge status: Home / Self Care | Payer: BC Managed Care – PPO | Attending: Nurse Practitioner | Admitting: Nurse Practitioner

## 2022-05-31 DIAGNOSIS — J4521 Mild intermittent asthma with (acute) exacerbation: Secondary | ICD-10-CM | POA: Diagnosis not present

## 2022-05-31 DIAGNOSIS — J0100 Acute maxillary sinusitis, unspecified: Secondary | ICD-10-CM

## 2022-05-31 DIAGNOSIS — H9203 Otalgia, bilateral: Secondary | ICD-10-CM

## 2022-05-31 MED ORDER — PROMETHAZINE-DM 6.25-15 MG/5ML PO SYRP: 5.0000 mL | ORAL_SOLUTION | Freq: Every evening | ORAL | 0 refills | Status: DC | PRN

## 2022-05-31 MED ORDER — AMOXICILLIN-POT CLAVULANATE 875-125 MG PO TABS: 1.0000 | ORAL_TABLET | Freq: Two times a day (BID) | ORAL | 0 refills | Status: AC

## 2022-05-31 MED ORDER — BENZONATATE 100 MG PO CAPS: 100.0000 mg | ORAL_CAPSULE | Freq: Three times a day (TID) | ORAL | 0 refills | Status: DC | PRN

## 2022-05-31 MED ORDER — PREDNISONE 10 MG (21) PO TBPK
ORAL_TABLET | ORAL | 0 refills | Status: DC
Start: 2022-05-31 — End: 2022-07-27

## 2022-05-31 NOTE — ED Triage Notes (Signed)
Pt reports cough, nasal congestion and dizziness x 1 week; fever x 1 day; ear pain started today. DayQuil, NyQuil and Mucinex gives no relief.

## 2022-05-31 NOTE — ED Provider Notes (Signed)
[facility]    CSN: [id number] Arrival date & time: 05/31/22  1615      History   Chief Complaint Chief Complaint  Patient presents with   Cough   Otalgia         HPI Mary Parker is a 41 y.o. female.   Patient presents with 1 week of body aches, chills, dry cough, shortness of breath and wheezing, chest tightness with coughing, nasal congestion, sinus pressure, headache, ear pain that started today.  She denies abdominal pain, nausea/vomiting, sneezing, postnasal drainage, runny nose, diarrhea.  Reports she has been more tired than normal.  Has been taking Mucinex, DayQuil, and NyQuil without much relief.  Patient reports a history of asthma, has been using albuterol inhaler more frequently that helps for a couple of hours.     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/2021   11/09/21 needs colposcopy   IBS (irritable bowel syndrome)    Knee pain, right    Migraines    Rebound headache 12/19/2014   Right ovarian cyst 04/25/2017   Complex, check CA 125 and repeat US in 6 weeks    Sciatica of left side    Seizures (HCC)    remote past, felt it was related to Chantix    Sleep apnea    Stroke [facility])    migraine induced stroke   Vitamin D deficiency    Wears glasses     Patient Active Problem List   Diagnosis Date Noted   Hemorrhoids, thrombosed 04/09/2022   Prolapsed internal hemorrhoids, grade 2 11/25/2021   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/09/2021   Rectal pain  12/22/2020   Nausea without vomiting 12/22/2020   Early satiety 12/22/2020   Loss of weight 12/22/2020   RUQ abdominal pain 10/15/2020   OSA (obstructive sleep apnea) 09/08/2020   Sleep walking and eating 07/30/2020   Excessive daytime sleepiness 07/30/2020   Class 3 severe obesity due to excess calories without serious comorbidity with body mass index (BMI) of 40.0 to 44.9 in adult (HCC) 07/30/2020   Psychophysiological insomnia 07/30/2020   Loud snoring 07/30/2020   Chronic migraine without aura, with intractable migraine, so stated, with status migrainosus 07/12/2019   Cigarette smoker 05/04/2019   Obesity, Class II, BMI 35-39.9 05/04/2019   Stroke-like episode (HCC) s/p tPA 05/03/2019   Acute gastroenteritis 03/29/2019   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   S/P abdominal supracervical subtotal hysterectomy 06/21/2017   Pelvic adhesions 06/09/2017   History of ovarian cyst 06/09/2017   Right ovarian cyst 04/25/2017   Rectal bleeding 04/02/2017   LLQ pain 04/01/2017   Constipation 04/01/2017   Concussion with loss of consciousness 11/16/2016   Anxiety 10/14/2016   Chronic low back pain 05/29/2015   Complicated migraine 12/19/2014   Fibromyalgia 12/19/2014   Rebound headache 12/19/2014   Depression 12/11/2014   Body aches 12/11/2014    Past Surgical History:  Procedure Laterality  Date   BILATERAL SALPINGECTOMY Bilateral 06/21/2017   Procedure: BILATERAL SALPINGECTOMY;  Surgeon: Tilda Burrow, MD;  Location: [facility];  Service: Gynecology;  Laterality: Bilateral;   CESAREAN SECTION  [phone]   CHOLECYSTECTOMY  2001   COLONOSCOPY WITH PROPOFOL N/A 04/07/2017   Dr. Lovena Neighbours: Normal, abnormal CT likely artifactual   ENDOMETRIAL ABLATION     ESOPHAGOGASTRODUODENOSCOPY  2002   [facility]: normal   KNEE ARTHROSCOPY WITH LATERAL RELEASE Right 04/18/2018   Procedure: Right knee arthroscopy with chondroplasty and lateral release;  Surgeon: Yolonda Kida, MD;   Location: [facility];  Service: Orthopedics;  Laterality: Right;  60 mins   LASER ABLATION CONDOLAMATA N/A 12/30/2021   Procedure: LASER ABLATION of the cervix;  Surgeon: Lazaro Arms, MD;  Location: [facility];  Service: Gynecology;  Laterality: N/A;   SUPRACERVICAL ABDOMINAL HYSTERECTOMY N/A 06/21/2017   Procedure: HYSTERECTOMY SUPRACERVICAL ABDOMINAL;  Surgeon: Tilda Burrow, MD;  Location: [facility];  Service: Gynecology;  Laterality: N/A;   TUBAL LIGATION      OB History     Gravida  3   Para  3   Term  3   Preterm      AB      Living  3      SAB      IAB      Ectopic      Multiple      Live Births  3            Home Medications    Prior to Admission medications   Medication Sig Start Date End Date Taking? Authorizing Provider  benzonatate (TESSALON) 100 MG capsule Take 1 capsule (100 mg total) by mouth 3 (three) times daily as needed for cough. Do not take with alcohol or while driving or operating heavy machinery 05/31/22  Yes Valentino Nose, NP  promethazine-dextromethorphan (PROMETHAZINE-DM) 6.25-15 MG/5ML syrup Take 5 mLs by mouth at bedtime as needed for cough. Do not take with alcohol or while driving or operating heavy machinery 05/31/22  Yes Valentino Nose, NP  ALPRAZolam Prudy Feeler) 1 MG tablet Take 1 mg by mouth 2 (two) times daily. 09/21/21   [provider]  amoxicillin-clavulanate (AUGMENTIN) 875-125 MG tablet Take 1 tablet by mouth 2 (two) times daily for 7 days. 05/31/22 06/07/22 Yes Valentino Nose, NP  budesonide-formoterol (SYMBICORT) 160-4.5 MCG/ACT inhaler Inhale 2 puffs into the lungs 2 (two) times daily as needed (for flares).    [provider]  cyanocobalamin (,VITAMIN B-12,) 1000 MCG/ML injection Inject 1,000 mcg into the skin every 30 (thirty) days.  02/17/18   [provider]  dicyclomine (BENTYL) 10 MG capsule TAKE 1 CAPSULE BY MOUTH UP TO THREE TIMES DAILY AS NEEDED FOR SPASMS OR ABDOMINAL  PAIN 07/16/21   Tiffany Kocher, PA-C  escitalopram (LEXAPRO) 10 MG tablet Take 10 mg by mouth at bedtime. 10/31/17   [provider]  indomethacin (INDOCIN) 25 MG capsule Take 25 mg by mouth daily as needed (migraine). 09/10/21   [provider]  ketorolac (TORADOL) 10 MG tablet Take 1 tablet (10 mg total) by mouth every 8 (eight) hours as needed. 12/30/21   Lazaro Arms, MD  MELATONIN PO Take 1 tablet by mouth at bedtime.    [provider]  omeprazole (PRILOSEC) 20 MG capsule Take 20 mg by mouth daily. 12/18/21   [provider]  ondansetron (ZOFRAN-ODT) 4 MG disintegrating tablet DISSOLVE 1 TABLET(4 MG) ON THE  TONGUE EVERY 8 HOURS AS NEEDED FOR NAUSEA OR VOMITING 07/21/21   York SpanielWillis, Charles K, MD  ondansetron (ZOFRAN-ODT) 8 MG disintegrating tablet Take 1 tablet (8 mg total) by mouth every 8 (eight) hours as needed for nausea or vomiting. 12/30/21   Lazaro ArmsEure, Luther H, MD  predniSONE (STERAPRED UNI-PAK 21 TAB) 10 MG (21) TBPK tablet Take per package instructions 05/31/22  Yes Cathlean MarseillesMartinez, Artina Minella A, NP  tiZANidine (ZANAFLEX) 2 MG tablet Take 1 tablet (2 mg total) by mouth at bedtime. 05/04/19   Layne BentonBiby, Sharon L, NP  TRUDHESA 0.725 MG/ACT AERS Place 2 sprays into both nostrils as needed (migraine). 09/01/21   [provider]  Ubrogepant (UBRELVY) 100 MG TABS Take 100 mg by mouth as needed (take 1 tablet at onset of headache, may repeat 2 hours after initial dose, max is 200 mg/24 hours). 12/11/20   Glean SalvoSlack, Sarah J, NP  Vitamin D, Ergocalciferol, (DRISDOL) 50000 units CAPS capsule Take 50,000 Units by mouth every Sunday.  02/18/18   [provider]    Family History Family History  Problem Relation Age of Onset   Cancer Maternal Grandmother    Osteoporosis Maternal Grandmother    Cancer Maternal Grandfather    Diabetes Father    Hypertension Father    Alcohol abuse Father    Breast cancer Mother    Hypertension Mother    Other Mother        abnormal cells;  had hyst   Obesity Daughter    Diabetes Daughter [id number]       youngest   Obesity Daughter    Obesity Son    Colon cancer Neg Hx     Social History Social History   Tobacco Use   Smoking status: Former    Packs/day: 0.25    Years: 25.00    Total pack years: 6.25    Types: Cigarettes    Quit date: 04/02/2019    Years since quitting: 3.1   Smokeless tobacco: Never  Vaping Use   Vaping Use: Never used  Substance Use Topics   Alcohol use: No   Drug use: No     Allergies   Bupropion, Duloxetine hcl, Gabapentin, Meloxicam, Pregabalin, Amitriptyline, Ciprofloxacin, Desvenlafaxine succinate er, Hydrocodone-acetaminophen, Milnacipran, Nortriptyline, Topiramate, Decadron [dexamethasone], Prednisone, Doxycycline, and Percocet [oxycodone-acetaminophen]   Review of Systems Review of Systems Per HPI  Physical Exam Triage Vital Signs ED Triage Vitals  Enc Vitals Group     BP 05/31/22 1716 115/78     Pulse Rate 05/31/22 1716 65     Resp 05/31/22 1716 18     Temp 05/31/22 1716 98.6 F (37 C)     Temp Source 05/31/22 1715 Oral     SpO2 05/31/22 1716 98 %     Weight --      Height --      Head Circumference --      Peak Flow --      Pain Score 05/31/22 1714 3     Pain Loc --      Pain Edu? --      Excl. in GC? --    No data found.  Updated Vital Signs BP 115/78 (BP Location: Right Arm)   Pulse 65   Temp 98.6 F (37 C) (Oral)   Resp 18   SpO2 98%   Visual Acuity Right Eye Distance:   Left Eye Distance:   Bilateral Distance:    Right Eye Near:   Left Eye Near:    Bilateral Near:  Physical Exam Vitals and nursing note reviewed.  Constitutional:      General: She is not in acute distress.    Appearance: Normal appearance. She is not ill-appearing or toxic-appearing.  HENT:     Head: Normocephalic and atraumatic.     Right Ear: Tympanic membrane, ear canal and external ear normal.     Left Ear: Tympanic membrane, ear canal and external ear normal.     Nose:  Congestion and rhinorrhea present.     Right Sinus: Maxillary sinus tenderness present. No frontal sinus tenderness.     Left Sinus: Maxillary sinus tenderness present. No frontal sinus tenderness.     Mouth/Throat:     Mouth: Mucous membranes are moist.     Pharynx: Oropharynx is clear. No oropharyngeal exudate or posterior oropharyngeal erythema.  Eyes:     General: No scleral icterus.    Extraocular Movements: Extraocular movements intact.  Cardiovascular:     Rate and Rhythm: Normal rate and regular rhythm.  Pulmonary:     Effort: Pulmonary effort is normal. No respiratory distress.     Breath sounds: Wheezing present. No rhonchi or rales.  Musculoskeletal:     Cervical back: Normal range of motion and neck supple.  Skin:    General: Skin is warm and dry.     Capillary Refill: Capillary refill takes less than 2 seconds.     Coloration: Skin is not jaundiced or pale.     Findings: No erythema or rash.  Neurological:     Mental Status: She is alert and oriented to person, place, and time.  Psychiatric:        Behavior: Behavior is cooperative.      UC Treatments / Results  Labs (all labs ordered are listed, but only abnormal results are displayed) Labs Reviewed - No data to display  EKG   Radiology No results found.  Procedures Procedures (including critical care time)  Medications Ordered in UC Medications - No data to display  Initial Impression / Assessment and Plan / UC Course  I have reviewed the triage vital signs and the nursing notes.  Pertinent labs & imaging results that were available during my care of the patient were reviewed by me and considered in my medical decision making (see chart for details).    Patient is a very pleasant, well-appearing 41 year old female presenting for sinus pressure for 1 week.  On examination, vital signs are stable, she is afebrile, and she is oxygenating well above 95% on room air.  Given expiratory wheezing, sinus  tenderness upon percussion, will treat with Augmentin twice daily for 7 days to treat the sinus infection along with prednisone taper pack for the asthma exacerbation.  She has significant allergic reactions to dexamethasone and generic prednisone, so we will avoid these.  Additionally, can use cough suppressants as needed for dry cough.  Encouraged supportive care including continuing Mucinex, plenty of hydration, nasal saline rinses as needed to help with symptoms.  If symptoms persist worsen spite treatment, seek care.  Note given for work.  ER precautions discussed.  Final Clinical Impressions(s) / UC Diagnoses   Final diagnoses:  Acute non-recurrent maxillary sinusitis  Mild intermittent asthma with acute exacerbation  Otalgia of both ears     Discharge Instructions      - Please take the Augmentin twice daily for 7 days to treat the sinus infection -Start the prednisone tomorrow to help treat the asthma exacerbation; continue albuterol as needed and Symbicort twice daily for  wheezing, shortness of breath, chest tightness -You can use cough Perles during the day as needed for cough as well as cough syrup at nighttime -Follow-up with [place] later this week if not feeling any better    ED Prescriptions     Medication Sig Dispense Auth. Provider   amoxicillin-clavulanate (AUGMENTIN) 875-125 MG tablet Take 1 tablet by mouth 2 (two) times daily for 7 days. 14 tablet Cathlean Marseilles A, NP   predniSONE (STERAPRED UNI-PAK 21 TAB) 10 MG (21) TBPK tablet Take per package instructions 21 each Cathlean Marseilles A, NP   benzonatate (TESSALON) 100 MG capsule Take 1 capsule (100 mg total) by mouth 3 (three) times daily as needed for cough. Do not take with alcohol or while driving or operating heavy machinery 21 capsule Cathlean Marseilles A, NP   promethazine-dextromethorphan (PROMETHAZINE-DM) 6.25-15 MG/5ML syrup Take 5 mLs by mouth at bedtime as needed for cough. Do not take with alcohol or while  driving or operating heavy machinery 118 mL Valentino Nose, NP      PDMP not reviewed this encounter.   Valentino Nose, NP 05/31/22 [phone]

## 2022-05-31 NOTE — Discharge Instructions (Signed)
-   Please take the Augmentin twice daily for 7 days to treat the sinus infection -Start the prednisone tomorrow to help treat the asthma exacerbation; continue albuterol as needed and Symbicort twice daily for wheezing, shortness of breath, chest tightness -You can use cough Perles during the day as needed for cough as well as cough syrup at nighttime -Follow-up with [place] later this week if not feeling any better

## 2022-06-02 ENCOUNTER — Telehealth: Payer: Self-pay | Admitting: Emergency Medicine

## 2022-06-02 NOTE — Telephone Encounter (Signed)
Pt called and inquired about work note extension from 8/2 to 8/3. Discussed with pt work note would be extended until 8/3 but would need to be re-seen if unable to return to work on that date. Pt verbalized understanding.

## 2022-06-16 DIAGNOSIS — G43909 Migraine, unspecified, not intractable, without status migrainosus: Secondary | ICD-10-CM | POA: Diagnosis not present

## 2022-06-16 DIAGNOSIS — F329 Major depressive disorder, single episode, unspecified: Secondary | ICD-10-CM | POA: Diagnosis not present

## 2022-06-28 DIAGNOSIS — F411 Generalized anxiety disorder: Secondary | ICD-10-CM | POA: Diagnosis not present

## 2022-07-02 DIAGNOSIS — F411 Generalized anxiety disorder: Secondary | ICD-10-CM | POA: Diagnosis not present

## 2022-07-09 ENCOUNTER — Ambulatory Visit: Payer: BC Managed Care – PPO | Admitting: Obstetrics & Gynecology

## 2022-07-22 DIAGNOSIS — F411 Generalized anxiety disorder: Secondary | ICD-10-CM | POA: Diagnosis not present

## 2022-07-27 ENCOUNTER — Encounter: Payer: Self-pay | Admitting: Obstetrics & Gynecology

## 2022-07-27 ENCOUNTER — Ambulatory Visit (INDEPENDENT_AMBULATORY_CARE_PROVIDER_SITE_OTHER): Payer: BC Managed Care – PPO | Admitting: Obstetrics & Gynecology

## 2022-07-27 ENCOUNTER — Other Ambulatory Visit (HOSPITAL_COMMUNITY)
Admission: RE | Admit: 2022-07-27 | Discharge: 2022-07-27 | Disposition: A | Discharge status: Home / Self Care | Payer: BC Managed Care – PPO | Source: Ambulatory Visit | Attending: Obstetrics & Gynecology | Admitting: Obstetrics & Gynecology

## 2022-07-27 VITALS — BP 114/85 | HR 74 | Ht 62.0 in | Wt 189.0 lb

## 2022-07-27 DIAGNOSIS — Z124 Encounter for screening for malignant neoplasm of cervix: Secondary | ICD-10-CM | POA: Diagnosis not present

## 2022-07-27 DIAGNOSIS — Z9889 Other specified postprocedural states: Secondary | ICD-10-CM | POA: Diagnosis not present

## 2022-07-27 NOTE — Progress Notes (Signed)
Follow up appointment for post laser surveillance: 3/23   Chief Complaint  Patient presents with   Follow-up    Pap only    Blood pressure 114/85, pulse 74, height 5\' 2"  (1.575 m), weight 189 lb (85.7 kg).  HSIL large lesion multifocal   Here for first follow up cytology only(no HPV)  MEDS ordered this encounter: No orders of the defined types were placed in this encounter.   Orders for this encounter: No orders of the defined types were placed in this encounter.   Impression + Management Plan   ICD-10-CM   1. 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 Routine cervical smear  Z12.4 Cytology - PAP( [place])      Follow Up: Return in about 6 months (around 01/25/2023) for 2nd follow up Cytology, with Dr Elonda Husky.     All questions were answered.  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/2021   11/09/21 needs colposcopy   IBS (irritable bowel syndrome)    Knee pain, right    Migraines    Rebound headache 12/19/2014   Right ovarian cyst 04/25/2017   Complex, check CA 125 and repeat US in 6 weeks    Sciatica of left side    Seizures ([place])    remote past, felt it was related to Chantix    Sleep apnea    Stroke [facility])    migraine induced stroke   Vitamin D deficiency    Wears glasses     Past Surgical History:  Procedure Laterality Date   BILATERAL SALPINGECTOMY Bilateral 06/21/2017   Procedure: BILATERAL SALPINGECTOMY;  Surgeon: Jonnie Kind, MD;  Location: [facility];   Service: Gynecology;  Laterality: Bilateral;   CESAREAN SECTION  [phone]   CHOLECYSTECTOMY  2001   COLONOSCOPY WITH PROPOFOL N/A 04/07/2017   Dr. Emerson Monte: Normal, abnormal CT likely artifactual   ENDOMETRIAL ABLATION     ESOPHAGOGASTRODUODENOSCOPY  2002   [place] [facility]: normal   KNEE ARTHROSCOPY WITH LATERAL RELEASE Right 04/18/2018   Procedure: Right knee arthroscopy with chondroplasty and lateral release;  Surgeon: Nicholes Stairs, MD;  Location: [facility];  Service: Orthopedics;  Laterality: Right;  60 mins   LASER ABLATION CONDOLAMATA N/A 12/30/2021   Procedure: LASER ABLATION of the cervix;  Surgeon: Florian Buff, MD;  Location: [facility];  Service: Gynecology;  Laterality: N/A;   SUPRACERVICAL ABDOMINAL HYSTERECTOMY N/A 06/21/2017   Procedure: HYSTERECTOMY SUPRACERVICAL ABDOMINAL;  Surgeon: Jonnie Kind, MD;  Location: [facility];  Service: Gynecology;  Laterality: N/A;   TUBAL LIGATION      OB History     Gravida  3   Para  3   Term  3   Preterm      AB      Living  3      SAB      IAB  Ectopic      Multiple      Live Births  3           Allergies  Allergen Reactions   Bupropion Other (See Comments)    Suicidal ideation    Duloxetine Hcl Hives, Itching and Rash   Gabapentin Itching   Meloxicam Other (See Comments)    Abdominal cramping and mood changes   Pregabalin Other (See Comments)    Caused TREMORS    Amitriptyline Other (See Comments)    Allergy occurred awhile back- reaction not recalled   Ciprofloxacin Hives, Swelling and Other (See Comments)    Sweating and  Dizziness, also   Desvenlafaxine Succinate Er Other (See Comments)    "Made me feel like I couldn't urinate"   Hydrocodone-Acetaminophen Swelling   Milnacipran Nausea Only and Other (See Comments)    Dizziness and leg cramps, also    Nortriptyline Nausea And Vomiting and Other (See Comments)    GERD, also   Topiramate Swelling and Other (See Comments)     Dizziness and slurred speech, also   Decadron [Dexamethasone] Other (See Comments)    Flushing, dizziness, and the patient PASSED OUT   Prednisone     Tablets- caused black/pass out.  Ok when used Blister pack per pt.   Doxycycline Nausea Only   Percocet [Oxycodone-Acetaminophen] Nausea Only    Social History   Socioeconomic History   Marital status: Divorced    Spouse name: Not on file   Number of children: 3   Years of education: HS   Highest education level: Not on file  Occupational History   Occupation: [employer]  Tobacco Use   Smoking status: Former    Packs/day: 0.25    Years: 25.00    Total pack years: 6.25    Types: Cigarettes    Quit date: 04/02/2019    Years since quitting: 3.3   Smokeless tobacco: Never  Vaping Use   Vaping Use: Never used  Substance and Sexual Activity   Alcohol use: No   Drug use: No   Sexual activity: Not Currently    Birth control/protection: Surgical    Comment: supracervical hyst and tubal  Other Topics Concern   Not on file  Social History Narrative   Patient is right handed.   Patient drinks 1-2 cups of caffeine dailyy.   Patient lives mom stepdad, and children.   Social Determinants of Health   Financial Resource Strain: Low Risk  (11/04/2021)   Overall Financial Resource Strain (CARDIA)    Difficulty of Paying Living Expenses: Not very hard  Food Insecurity: Food Insecurity Present (11/04/2021)   Hunger Vital Sign    Worried About Running Out of Food in the Last Year: Sometimes true    Ran Out of Food in the Last Year: Sometimes true  Transportation Needs: No Transportation Needs (11/04/2021)   PRAPARE - Administrator, Civil Service (Medical): No    Lack of Transportation (Non-Medical): No  Physical Activity: Inactive (11/04/2021)   Exercise Vital Sign    Days of Exercise per Week: 0 days    Minutes of Exercise per Session: 0 min  Stress: Stress Concern Present (11/04/2021)   [employer] of Occupational  Health - Occupational Stress Questionnaire    Feeling of Stress : Very much  Social Connections: Socially Isolated (11/04/2021)   Social Connection and Isolation Panel [NHANES]    Frequency of Communication with Friends and Family: Once a week    Frequency  of Social Gatherings with Friends and Family: Once a week    Attends Religious Services: Never    Database administrator or Organizations: No    Attends Engineer, structural: Never    Marital Status: Divorced    Family History  Problem Relation Age of Onset   Cancer Maternal Grandmother    Osteoporosis Maternal Grandmother    Cancer Maternal Grandfather    Diabetes Father    Hypertension Father    Alcohol abuse Father    Breast cancer Mother    Hypertension Mother    Other Mother        abnormal cells; had hyst   Obesity Daughter    Diabetes Daughter 49       youngest   Obesity Daughter    Obesity Son    Colon cancer Neg Hx

## 2022-07-28 DIAGNOSIS — F411 Generalized anxiety disorder: Secondary | ICD-10-CM | POA: Diagnosis not present

## 2022-08-02 LAB — CYTOLOGY - PAP

## 2022-08-06 DIAGNOSIS — F411 Generalized anxiety disorder: Secondary | ICD-10-CM | POA: Diagnosis not present

## 2022-08-11 ENCOUNTER — Other Ambulatory Visit: Payer: Self-pay | Admitting: Gastroenterology

## 2022-08-11 NOTE — Telephone Encounter (Signed)
Please find out if patient is still using dicyclomine prn. Looks like the Rx was discontinued on 9/26.

## 2022-08-13 ENCOUNTER — Other Ambulatory Visit: Payer: Self-pay | Admitting: Gastroenterology

## 2022-08-13 DIAGNOSIS — F411 Generalized anxiety disorder: Secondary | ICD-10-CM | POA: Diagnosis not present

## 2022-08-13 NOTE — Telephone Encounter (Signed)
LMOM for pt to call office  

## 2022-08-13 NOTE — Telephone Encounter (Signed)
Spoke to pt, she informed me that she takes dicyclomine as needed when she has stomach pains.

## 2022-08-13 NOTE — Telephone Encounter (Signed)
Rx sent 

## 2022-08-20 DIAGNOSIS — F411 Generalized anxiety disorder: Secondary | ICD-10-CM | POA: Diagnosis not present

## 2022-08-24 DIAGNOSIS — F411 Generalized anxiety disorder: Secondary | ICD-10-CM | POA: Diagnosis not present

## 2022-08-27 ENCOUNTER — Other Ambulatory Visit: Payer: Self-pay

## 2022-08-27 ENCOUNTER — Encounter: Payer: Self-pay | Admitting: Emergency Medicine

## 2022-08-27 ENCOUNTER — Ambulatory Visit
Admission: EM | Admit: 2022-08-27 | Discharge: 2022-08-27 | Disposition: A | Discharge status: Home / Self Care | Payer: BC Managed Care – PPO | Attending: Nurse Practitioner | Admitting: Nurse Practitioner

## 2022-08-27 DIAGNOSIS — J029 Acute pharyngitis, unspecified: Secondary | ICD-10-CM | POA: Insufficient documentation

## 2022-08-27 DIAGNOSIS — Z1152 Encounter for screening for COVID-19: Secondary | ICD-10-CM | POA: Diagnosis not present

## 2022-08-27 DIAGNOSIS — R6889 Other general symptoms and signs: Secondary | ICD-10-CM | POA: Diagnosis not present

## 2022-08-27 LAB — RESP PANEL BY RT-PCR (FLU A&B, COVID) ARPGX2
Influenza A by PCR: NEGATIVE
Influenza B by PCR: NEGATIVE
SARS Coronavirus 2 by RT PCR: NEGATIVE

## 2022-08-27 LAB — POCT RAPID STREP A (OFFICE): Rapid Strep A Screen: NEGATIVE

## 2022-08-27 NOTE — ED Provider Notes (Signed)
[facility]    CSN: [id number] Arrival date & time: 08/27/22  1653      History   Chief Complaint No chief complaint on file.   HPI Mary Parker is a 41 y.o. female.   The history is provided by the patient.   The patient presents with a one-day history of fatigue, fatigue, fever and headache. Patient also states she has developed a sore throat that started today. Patient denies ear pain, nasal congestion, runny nose, cough, abdominal pain, nausea, vomiting or diarrhea. 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 She reports she has been vaccinated for COVID, but has not been vaccinated for influenza.   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/2021   11/09/21 needs colposcopy   IBS (irritable bowel syndrome)    Knee pain, right    Migraines    Rebound headache 12/19/2014   Right ovarian cyst 04/25/2017   Complex, check CA 125 and repeat US in 6 weeks    Sciatica of left side    Seizures ([facility])    remote past, felt it was related to Chantix    Sleep apnea    Stroke [facility])    migraine induced stroke   Vitamin D deficiency    Wears glasses     Patient Active Problem List   Diagnosis Date Noted   Hemorrhoids, thrombosed 04/09/2022   Prolapsed internal hemorrhoids, grade 2 11/25/2021   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/09/2021   Rectal pain 12/22/2020   Nausea without vomiting 12/22/2020   Early satiety 12/22/2020   Loss of weight 12/22/2020    RUQ abdominal pain 10/15/2020   OSA (obstructive sleep apnea) 09/08/2020   Sleep walking and eating 07/30/2020   Excessive daytime sleepiness 07/30/2020   Class 3 severe obesity due to excess calories without serious comorbidity with body mass index (BMI) of 40.0 to 44.9 in adult (HCC) 07/30/2020   Psychophysiological insomnia 07/30/2020   Loud snoring 07/30/2020   Chronic migraine without aura, with intractable migraine, so stated, with status migrainosus 07/12/2019   Cigarette smoker 05/04/2019   Obesity, Class II, BMI 35-39.9 05/04/2019   Stroke-like episode (HCC) s/p tPA 05/03/2019   Acute gastroenteritis 03/29/2019   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   S/P abdominal supracervical subtotal hysterectomy 06/21/2017   Pelvic adhesions 06/09/2017   History of ovarian cyst 06/09/2017   Right ovarian cyst 04/25/2017   Rectal bleeding 04/02/2017   LLQ pain 04/01/2017   Constipation 04/01/2017   Concussion with loss of consciousness 11/16/2016   Anxiety 10/14/2016   Chronic low back pain 05/29/2015   Complicated migraine 12/19/2014   Fibromyalgia 12/19/2014   Rebound headache 12/19/2014   Depression 12/11/2014   Body aches 12/11/2014    Past Surgical History:  Procedure Laterality Date   BILATERAL SALPINGECTOMY Bilateral 06/21/2017   Procedure: BILATERAL SALPINGECTOMY;  Surgeon: Tilda Burrow, MD;  Location: [facility];  Service: Gynecology;  Laterality: Bilateral;   CESAREAN SECTION  [phone]   CHOLECYSTECTOMY  2001   COLONOSCOPY WITH PROPOFOL N/A 04/07/2017   Dr. Emerson Monte: Normal, abnormal CT likely artifactual   ENDOMETRIAL ABLATION     ESOPHAGOGASTRODUODENOSCOPY  2002   [facility]: normal   KNEE ARTHROSCOPY WITH LATERAL RELEASE Right 04/18/2018   Procedure: Right knee arthroscopy with chondroplasty and lateral release;  Surgeon: Nicholes Stairs, MD;  Location: [facility]  Service: Orthopedics;  Laterality: Right;  60 mins   LASER ABLATION  CONDOLAMATA N/A 12/30/2021   Procedure: LASER ABLATION of the cervix;  Surgeon: Florian Buff, MD;  Location: [facility];  Service: Gynecology;  Laterality: N/A;   SUPRACERVICAL ABDOMINAL HYSTERECTOMY N/A 06/21/2017   Procedure: HYSTERECTOMY SUPRACERVICAL ABDOMINAL;  Surgeon: Jonnie Kind, MD;  Location: [facility];  Service: Gynecology;  Laterality: N/A;   TUBAL LIGATION      OB History     Gravida  3   Para  3   Term  3   Preterm      AB      Living  3      SAB      IAB      Ectopic      Multiple      Live Births  3            Home Medications    Prior to Admission medications   Medication Sig Start Date End Date Taking? Authorizing Provider  ALPRAZolam Duanne Moron) 1 MG tablet Take 1 mg by mouth 2 (two) times daily. 09/21/21   [provider]  budesonide-formoterol (SYMBICORT) 160-4.5 MCG/ACT inhaler Inhale 2 puffs into the lungs 2 (two) times daily as needed (for flares).    [provider]  cyanocobalamin (,VITAMIN B-12,) 1000 MCG/ML injection Inject 1,000 mcg into the skin every 30 (thirty) days.  02/17/18   [provider]  dicyclomine (BENTYL) 10 MG capsule TAKE 1 CAPSULE BY MOUTH UP TO THREE TIMES DAILY AS NEEDED FOR SPASMS OR ABDOMINAL PAIN 08/13/22   Erenest Rasher, PA-C  hydrOXYzine (ATARAX) 25 MG tablet Take 25 mg by mouth at bedtime. 07/19/22   [provider]  indomethacin (INDOCIN) 25 MG capsule Take 25 mg by mouth daily as needed (migraine). Patient not taking: Reported on 07/27/2022 09/10/21   [provider]  omeprazole (PRILOSEC) 20 MG capsule Take 20 mg by mouth daily. 12/18/21   [provider]  ondansetron (ZOFRAN-ODT) 4 MG disintegrating tablet DISSOLVE 1 TABLET(4 MG) ON THE TONGUE EVERY 8 HOURS AS NEEDED FOR NAUSEA OR VOMITING Patient not taking: Reported on 07/27/2022 07/21/21   Kathrynn Ducking, MD  ondansetron (ZOFRAN-ODT) 8 MG disintegrating tablet Take 1 tablet (8 mg total) by mouth every 8  (eight) hours as needed for nausea or vomiting. Patient not taking: Reported on 07/27/2022 12/30/21   Florian Buff, MD  sertraline (ZOLOFT) 50 MG tablet Take 50 mg by mouth daily. 07/10/22   [provider]  tiZANidine (ZANAFLEX) 2 MG tablet Take 1 tablet (2 mg total) by mouth at bedtime. 05/04/19   Donzetta Starch, NP  TRUDHESA 0.725 MG/ACT AERS Place 2 sprays into both nostrils as needed (migraine). 09/01/21   [provider]  Ubrogepant (UBRELVY) 100 MG TABS Take 100 mg by mouth as needed (take 1 tablet at onset of headache, may repeat 2 hours after initial dose, max is 200 mg/24 hours). 12/11/20   Suzzanne Cloud, NP  Vitamin D, Ergocalciferol, (DRISDOL) 50000 units CAPS capsule Take 50,000 Units by mouth every Sunday.  Patient not taking: Reported on 07/27/2022 02/18/18   [provider]    Family History Family History  Problem Relation Age of Onset   Cancer Maternal Grandmother    Osteoporosis Maternal Grandmother    Cancer Maternal Grandfather    Diabetes Father    Hypertension Father    Alcohol abuse Father    Breast cancer Mother    Hypertension Mother    Other Mother        abnormal cells; had hyst   Obesity Daughter    Diabetes Daughter [id number]       youngest   Obesity Daughter    Obesity Son    Colon cancer Neg Hx     Social History Social History   Tobacco Use   Smoking status: Former    Packs/day: 0.25    Years: 25.00    Total pack years: 6.25    Types: Cigarettes    Quit date: 04/02/2019    Years since quitting: 3.4   Smokeless tobacco: Never  Vaping Use   Vaping Use: Never used  Substance Use Topics   Alcohol use: No   Drug use: No     Allergies   Bupropion, Duloxetine hcl, Gabapentin, Meloxicam, Pregabalin, Amitriptyline, Ciprofloxacin, Desvenlafaxine succinate er, Hydrocodone-acetaminophen, Milnacipran, Nortriptyline, Topiramate, Decadron [dexamethasone], Prednisone, Doxycycline, and Percocet [oxycodone-acetaminophen]   Review of  Systems Review of Systems Per HPI  Physical Exam Triage Vital Signs ED Triage Vitals  Enc Vitals Group     BP 08/27/22 1829 125/86     Pulse Rate 08/27/22 1829 64     Resp 08/27/22 1829 20     Temp 08/27/22 1829 97.9 F (36.6 C)     Temp Source 08/27/22 1829 Oral     SpO2 08/27/22 1829 98 %     Weight --      Height --      Head Circumference --      Peak Flow --      Pain Score 08/27/22 1828 8     Pain Loc --      Pain Edu? --      Excl. in GC? --    No data found.  Updated Vital Signs BP 125/86 (BP Location: Right Arm)   Pulse 64   Temp 97.9 F (36.6 C) (Oral)   Resp 20   SpO2 98%   Visual Acuity Right Eye Distance:   Left Eye Distance:   Bilateral Distance:    Right Eye Near:   Left Eye Near:    Bilateral Near:     Physical Exam Vitals and nursing note reviewed.  Constitutional:      Appearance: Normal appearance. She is ill-appearing. She is not toxic-appearing.  HENT:     Head: Normocephalic.     Right Ear: Tympanic membrane, ear canal and external ear normal.     Left Ear: Tympanic membrane, ear canal and external ear normal.     Nose: Nose normal.     Mouth/Throat:     Lips: Pink.     Mouth: Mucous membranes are moist.     Pharynx: Uvula midline. Pharyngeal swelling and posterior oropharyngeal erythema present. No oropharyngeal exudate or uvula swelling.  Eyes:     Extraocular Movements: Extraocular movements intact.     Conjunctiva/sclera: Conjunctivae normal.     Pupils: Pupils are equal, round, and reactive to light.  Cardiovascular:  Rate and Rhythm: Normal rate and regular rhythm.     Pulses: Normal pulses.     Heart sounds: Normal heart sounds.  Pulmonary:     Effort: Pulmonary effort is normal. No respiratory distress.     Breath sounds: Normal breath sounds. No stridor. No wheezing, rhonchi or rales.  Chest:     Chest wall: No tenderness.  Abdominal:     General: Bowel sounds are normal.     Palpations: Abdomen is soft.      Tenderness: There is no abdominal tenderness.  Musculoskeletal:     Cervical back: Normal range of motion.  Lymphadenopathy:     Cervical: No cervical adenopathy.  Skin:    General: Skin is warm and dry.  Neurological:     General: No focal deficit present.     Mental Status: She is alert and oriented to person, place, and time.  Psychiatric:        Mood and Affect: Mood normal.        Behavior: Behavior normal.      UC Treatments / Results  Labs (all labs ordered are listed, but only abnormal results are displayed) Labs Reviewed  RESP PANEL BY RT-PCR (FLU A&B, COVID) [id number]  CULTURE, GROUP A STREP [facility])  POCT RAPID STREP A (OFFICE)    EKG   Radiology No results found.  Procedures Procedures (including critical care time)  Medications Ordered in UC Medications - No data to display  Initial Impression / Assessment and Plan / UC Course  I have reviewed the triage vital signs and the nursing notes.  Pertinent labs & imaging results that were available during my care of the patient were reviewed by me and considered in my medical decision making (see chart for details).  Rapid strep test is negative, throat culture and COVID/flu test are pending at this time.  Patient is well-appearing, she is in no acute distress, vital signs are stable.  Symptoms consistent with a viral illness at this time.  Differential diagnoses include COVID, influenza, and viral illness.  Patient was advised to use over-the-counter analgesics such as Tylenol or ibuprofen for pain, fever, or general discomfort, increase fluids, and allow for plenty of rest.  Discussed viral etiology with the patient.  Patient is a candidate to receive Paxlovid if the COVID test is positive.  Advised patient that symptoms should begin to improve, but could last up to 14 days.  Patient verbalizes understanding.  All questions were answered.  Patient is stable for discharge. Final Clinical Impressions(s) / UC Diagnoses    Final diagnoses:  Encounter for screening for COVID-19  Flu-like symptoms  Sore throat     Discharge Instructions      Rapid strep test is negative, throat culture and COVID/flu testing are pending.  You will be contacted if the pending test results are positive.  As discussed, you are able to receive Paxlovid as an antiviral if the COVID test is positive. Take medication as prescribed. Increase fluids and allow for plenty of rest. Recommend Tylenol or ibuprofen as needed for pain, fever, or general discomfort.  Take medication every 8 hours to help with symptoms. Recommend throat lozenges, Chloraseptic or honey to help with throat pain. Warm salt water gargles 3-4 times daily to help with throat pain or discomfort. Recommend a diet with soft foods to include soups, broths, puddings, yogurt, Jell-O's, or popsicles until symptoms improve. Follow-up with your primary care physician if symptoms do not improve over the next 10 to  14 days.       ED Prescriptions   None    PDMP not reviewed this encounter.   Abran Cantor, NP 08/27/22 1938

## 2022-08-27 NOTE — Discharge Instructions (Addendum)
Rapid strep test is negative, throat culture and COVID/flu testing are pending.  You will be contacted if the pending test results are positive.  As discussed, you are able to receive Paxlovid as an antiviral if the COVID test is positive. Take medication as prescribed. Increase fluids and allow for plenty of rest. Recommend Tylenol or ibuprofen as needed for pain, fever, or general discomfort.  Take medication every 8 hours to help with symptoms. Recommend throat lozenges, Chloraseptic or honey to help with throat pain. Warm salt water gargles 3-4 times daily to help with throat pain or discomfort. Recommend a diet with soft foods to include soups, broths, puddings, yogurt, Jell-O's, or popsicles until symptoms improve. Follow-up with your primary care physician if symptoms do not improve over the next 10 to 14 days.

## 2022-08-27 NOTE — ED Triage Notes (Addendum)
Pt reports generalized weakness, fatigue, fever, headache since yesterday. Pt reports had to leave work last night and today.

## 2022-08-30 DIAGNOSIS — E538 Deficiency of other specified B group vitamins: Secondary | ICD-10-CM | POA: Diagnosis not present

## 2022-08-30 DIAGNOSIS — I10 Essential (primary) hypertension: Secondary | ICD-10-CM | POA: Diagnosis not present

## 2022-08-30 DIAGNOSIS — E559 Vitamin D deficiency, unspecified: Secondary | ICD-10-CM | POA: Diagnosis not present

## 2022-08-30 DIAGNOSIS — E782 Mixed hyperlipidemia: Secondary | ICD-10-CM | POA: Diagnosis not present

## 2022-09-02 DIAGNOSIS — F329 Major depressive disorder, single episode, unspecified: Secondary | ICD-10-CM | POA: Diagnosis not present

## 2022-09-02 DIAGNOSIS — I10 Essential (primary) hypertension: Secondary | ICD-10-CM | POA: Diagnosis not present

## 2022-09-02 DIAGNOSIS — F419 Anxiety disorder, unspecified: Secondary | ICD-10-CM | POA: Diagnosis not present

## 2022-09-02 DIAGNOSIS — E782 Mixed hyperlipidemia: Secondary | ICD-10-CM | POA: Diagnosis not present

## 2022-09-03 DIAGNOSIS — F411 Generalized anxiety disorder: Secondary | ICD-10-CM | POA: Diagnosis not present

## 2022-09-10 ENCOUNTER — Other Ambulatory Visit: Payer: Self-pay | Admitting: Family Medicine

## 2022-09-10 DIAGNOSIS — M5442 Lumbago with sciatica, left side: Secondary | ICD-10-CM

## 2022-09-17 DIAGNOSIS — F411 Generalized anxiety disorder: Secondary | ICD-10-CM | POA: Diagnosis not present

## 2022-09-20 ENCOUNTER — Other Ambulatory Visit: Payer: Self-pay | Admitting: Family Medicine

## 2022-09-20 DIAGNOSIS — M5442 Lumbago with sciatica, left side: Secondary | ICD-10-CM

## 2022-09-20 DIAGNOSIS — F4310 Post-traumatic stress disorder, unspecified: Secondary | ICD-10-CM | POA: Diagnosis not present

## 2022-09-20 DIAGNOSIS — G43909 Migraine, unspecified, not intractable, without status migrainosus: Secondary | ICD-10-CM | POA: Diagnosis not present

## 2022-09-21 DIAGNOSIS — M546 Pain in thoracic spine: Secondary | ICD-10-CM | POA: Diagnosis not present

## 2022-09-21 DIAGNOSIS — M9902 Segmental and somatic dysfunction of thoracic region: Secondary | ICD-10-CM | POA: Diagnosis not present

## 2022-09-21 DIAGNOSIS — M9901 Segmental and somatic dysfunction of cervical region: Secondary | ICD-10-CM | POA: Diagnosis not present

## 2022-09-21 DIAGNOSIS — M542 Cervicalgia: Secondary | ICD-10-CM | POA: Diagnosis not present

## 2022-10-01 DIAGNOSIS — G43909 Migraine, unspecified, not intractable, without status migrainosus: Secondary | ICD-10-CM | POA: Diagnosis not present

## 2022-10-01 DIAGNOSIS — F411 Generalized anxiety disorder: Secondary | ICD-10-CM | POA: Diagnosis not present

## 2022-10-07 ENCOUNTER — Ambulatory Visit
Admission: RE | Admit: 2022-10-07 | Discharge: 2022-10-07 | Disposition: A | Discharge status: Home / Self Care | Payer: BC Managed Care – PPO | Source: Ambulatory Visit | Attending: Family Medicine | Admitting: Family Medicine

## 2022-10-07 DIAGNOSIS — M5442 Lumbago with sciatica, left side: Secondary | ICD-10-CM

## 2022-10-07 MED ORDER — IOPAMIDOL (ISOVUE-M 200) INJECTION 41%
1.0000 mL | Freq: Once | INTRAMUSCULAR | Status: AC
  Administered 2022-10-07: 1 mL via INTRA_ARTICULAR

## 2022-10-07 MED ORDER — METHYLPREDNISOLONE ACETATE 40 MG/ML INJ SUSP (RADIOLOG
80.0000 mg | Freq: Once | INTRAMUSCULAR | Status: AC
  Administered 2022-10-07: 80 mg via INTRA_ARTICULAR

## 2022-10-08 DIAGNOSIS — F411 Generalized anxiety disorder: Secondary | ICD-10-CM | POA: Diagnosis not present

## 2022-10-22 DIAGNOSIS — F411 Generalized anxiety disorder: Secondary | ICD-10-CM | POA: Diagnosis not present

## 2022-11-04 DIAGNOSIS — G43909 Migraine, unspecified, not intractable, without status migrainosus: Secondary | ICD-10-CM | POA: Diagnosis not present

## 2022-11-04 DIAGNOSIS — R109 Unspecified abdominal pain: Secondary | ICD-10-CM | POA: Diagnosis not present

## 2022-11-04 DIAGNOSIS — M542 Cervicalgia: Secondary | ICD-10-CM | POA: Diagnosis not present

## 2022-11-04 DIAGNOSIS — F329 Major depressive disorder, single episode, unspecified: Secondary | ICD-10-CM | POA: Diagnosis not present

## 2022-11-12 DIAGNOSIS — G43909 Migraine, unspecified, not intractable, without status migrainosus: Secondary | ICD-10-CM | POA: Diagnosis not present

## 2022-11-12 DIAGNOSIS — M542 Cervicalgia: Secondary | ICD-10-CM | POA: Diagnosis not present

## 2022-11-12 DIAGNOSIS — F411 Generalized anxiety disorder: Secondary | ICD-10-CM | POA: Diagnosis not present

## 2022-11-19 DIAGNOSIS — F411 Generalized anxiety disorder: Secondary | ICD-10-CM | POA: Diagnosis not present

## 2022-11-26 DIAGNOSIS — F322 Major depressive disorder, single episode, severe without psychotic features: Secondary | ICD-10-CM | POA: Diagnosis not present

## 2022-12-01 DIAGNOSIS — F411 Generalized anxiety disorder: Secondary | ICD-10-CM | POA: Diagnosis not present

## 2022-12-02 DIAGNOSIS — M542 Cervicalgia: Secondary | ICD-10-CM | POA: Diagnosis not present

## 2022-12-02 DIAGNOSIS — G4701 Insomnia due to medical condition: Secondary | ICD-10-CM | POA: Diagnosis not present

## 2022-12-02 DIAGNOSIS — G43909 Migraine, unspecified, not intractable, without status migrainosus: Secondary | ICD-10-CM | POA: Diagnosis not present

## 2022-12-02 DIAGNOSIS — F329 Major depressive disorder, single episode, unspecified: Secondary | ICD-10-CM | POA: Diagnosis not present

## 2022-12-10 DIAGNOSIS — F411 Generalized anxiety disorder: Secondary | ICD-10-CM | POA: Diagnosis not present

## 2022-12-16 DIAGNOSIS — F411 Generalized anxiety disorder: Secondary | ICD-10-CM | POA: Diagnosis not present

## 2022-12-23 DIAGNOSIS — G510 Bell's palsy: Secondary | ICD-10-CM | POA: Diagnosis not present

## 2022-12-23 DIAGNOSIS — G43109 Migraine with aura, not intractable, without status migrainosus: Secondary | ICD-10-CM | POA: Diagnosis not present

## 2022-12-23 DIAGNOSIS — R030 Elevated blood-pressure reading, without diagnosis of hypertension: Secondary | ICD-10-CM | POA: Diagnosis not present

## 2022-12-23 DIAGNOSIS — R201 Hypoesthesia of skin: Secondary | ICD-10-CM | POA: Diagnosis not present

## 2022-12-25 ENCOUNTER — Other Ambulatory Visit: Payer: Self-pay

## 2022-12-25 ENCOUNTER — Emergency Department (HOSPITAL_COMMUNITY): Payer: BC Managed Care – PPO

## 2022-12-25 ENCOUNTER — Encounter (HOSPITAL_COMMUNITY): Payer: Self-pay | Admitting: *Deleted

## 2022-12-25 ENCOUNTER — Emergency Department (HOSPITAL_COMMUNITY)
Admission: EM | Admit: 2022-12-25 | Discharge: 2022-12-25 | Disposition: A | Discharge status: Home / Self Care | Payer: BC Managed Care – PPO | Attending: Emergency Medicine | Admitting: Emergency Medicine

## 2022-12-25 DIAGNOSIS — G43009 Migraine without aura, not intractable, without status migrainosus: Secondary | ICD-10-CM | POA: Diagnosis not present

## 2022-12-25 DIAGNOSIS — G43001 Migraine without aura, not intractable, with status migrainosus: Secondary | ICD-10-CM | POA: Diagnosis not present

## 2022-12-25 DIAGNOSIS — R29818 Other symptoms and signs involving the nervous system: Secondary | ICD-10-CM | POA: Diagnosis not present

## 2022-12-25 DIAGNOSIS — R531 Weakness: Secondary | ICD-10-CM | POA: Insufficient documentation

## 2022-12-25 DIAGNOSIS — G43909 Migraine, unspecified, not intractable, without status migrainosus: Secondary | ICD-10-CM | POA: Diagnosis not present

## 2022-12-25 DIAGNOSIS — R519 Headache, unspecified: Secondary | ICD-10-CM | POA: Diagnosis not present

## 2022-12-25 MED ORDER — KETOROLAC TROMETHAMINE 30 MG/ML IJ SOLN
30.0000 mg | Freq: Once | INTRAMUSCULAR | Status: AC
  Administered 2022-12-25: 30 mg via INTRAVENOUS
  Filled 2022-12-25: qty 1

## 2022-12-25 MED ORDER — DIPHENHYDRAMINE HCL 50 MG/ML IJ SOLN
25.0000 mg | Freq: Once | INTRAMUSCULAR | Status: AC
  Administered 2022-12-25: 25 mg via INTRAVENOUS
  Filled 2022-12-25: qty 1

## 2022-12-25 MED ORDER — METOCLOPRAMIDE HCL 5 MG/ML IJ SOLN
10.0000 mg | Freq: Once | INTRAMUSCULAR | Status: AC
  Administered 2022-12-25: 10 mg via INTRAVENOUS
  Filled 2022-12-25: qty 2

## 2022-12-25 NOTE — ED Triage Notes (Signed)
Pt c/o migraine and right sided weakness x 4 days. Pt reports having right sided weakness in the past with her migraines. LKW Tuesday per pt.

## 2022-12-25 NOTE — ED Provider Notes (Signed)
[facility] Provider Note   CSN: [id number] Arrival date & time: 12/25/22  [id number]     History {Add pertinent medical, surgical, social history, OB history to HPI:1} Chief Complaint  Patient presents with   Migraine    CACIA COVALT is a 42 y.o. female.  Has a history of migraines.  Patient states that she is having a typical migraine but she has some right-sided weakness.  This sometimes occurs with her migraines.   Migraine       Home Medications Prior to Admission medications   Medication Sig Start Date End Date Taking? Authorizing Provider  ALPRAZolam Duanne Moron) 1 MG tablet Take 1 mg by mouth 2 (two) times daily. 09/21/21   [provider]  budesonide-formoterol (SYMBICORT) 160-4.5 MCG/ACT inhaler Inhale 2 puffs into the lungs 2 (two) times daily as needed (for flares).    [provider]  cyanocobalamin (,VITAMIN B-12,) 1000 MCG/ML injection Inject 1,000 mcg into the skin every 30 (thirty) days.  02/17/18   [provider]  dicyclomine (BENTYL) 10 MG capsule TAKE 1 CAPSULE BY MOUTH UP TO THREE TIMES DAILY AS NEEDED FOR SPASMS OR ABDOMINAL PAIN 08/13/22   Erenest Rasher, PA-C  hydrOXYzine (ATARAX) 25 MG tablet Take 25 mg by mouth at bedtime. 07/19/22   [provider]  indomethacin (INDOCIN) 25 MG capsule Take 25 mg by mouth daily as needed (migraine). Patient not taking: Reported on 07/27/2022 09/10/21   [provider]  omeprazole (PRILOSEC) 20 MG capsule Take 20 mg by mouth daily. 12/18/21   [provider]  ondansetron (ZOFRAN-ODT) 4 MG disintegrating tablet DISSOLVE 1 TABLET(4 MG) ON THE TONGUE EVERY 8 HOURS AS NEEDED FOR NAUSEA OR VOMITING Patient not taking: Reported on 07/27/2022 07/21/21   Kathrynn Ducking, MD  ondansetron (ZOFRAN-ODT) 8 MG disintegrating tablet Take 1 tablet (8 mg total) by mouth every 8 (eight) hours as needed for nausea or vomiting. Patient not taking:  Reported on 07/27/2022 12/30/21   Florian Buff, MD  sertraline (ZOLOFT) 50 MG tablet Take 50 mg by mouth daily. 07/10/22   [provider]  tiZANidine (ZANAFLEX) 2 MG tablet Take 1 tablet (2 mg total) by mouth at bedtime. 05/04/19   Donzetta Starch, NP  TRUDHESA 0.725 MG/ACT AERS Place 2 sprays into both nostrils as needed (migraine). 09/01/21   [provider]  Ubrogepant (UBRELVY) 100 MG TABS Take 100 mg by mouth as needed (take 1 tablet at onset of headache, may repeat 2 hours after initial dose, max is 200 mg/24 hours). 12/11/20   Suzzanne Cloud, NP  Vitamin D, Ergocalciferol, (DRISDOL) 50000 units CAPS capsule Take 50,000 Units by mouth every Sunday.  Patient not taking: Reported on 07/27/2022 02/18/18   [provider]      Allergies    Bupropion, Duloxetine hcl, Gabapentin, Meloxicam, Pregabalin, Amitriptyline, Ciprofloxacin, Desvenlafaxine succinate er, Hydrocodone-acetaminophen, Milnacipran, Nortriptyline, Topiramate, Decadron [dexamethasone], Prednisone, Doxycycline, and Percocet [oxycodone-acetaminophen]    Review of Systems   Review of Systems  Physical Exam Updated Vital Signs BP (!) 101/55 (BP Location: Left Arm)   Pulse (!) 54   Temp 97.6 F (36.4 C) (Oral)   Resp 16   Ht '5\' 2"'$  (1.575 m)   Wt 85.7 kg   SpO2 96%   BMI 34.57 kg/m  Physical Exam  ED Results / Procedures / Treatments   Labs (all labs ordered are listed, but only abnormal results are displayed) Labs Reviewed -  No data to display  EKG None  Radiology CT Head Wo Contrast  Result Date: 12/25/2022 CLINICAL DATA:  Neuro deficit, acute, stroke suspected EXAM: CT HEAD WITHOUT CONTRAST TECHNIQUE: Contiguous axial images were obtained from the base of the skull through the vertex without intravenous contrast. RADIATION DOSE REDUCTION: This exam was performed according to the departmental dose-optimization program which includes automated exposure control, adjustment of the mA and/or kV  according to patient size and/or use of iterative reconstruction technique. COMPARISON:  01/12/2021 FINDINGS: Brain: No evidence of acute infarction, hemorrhage, hydrocephalus, extra-axial collection or mass lesion/mass effect. Vascular: No hyperdense vessel or unexpected calcification. Skull: Normal. Negative for fracture or focal lesion. Sinuses/Orbits: No acute finding. Other: Degenerative changes of the bilateral temporomandibular joints. IMPRESSION: No acute intracranial abnormality. Electronically Signed   By: Davina Poke D.O.   On: 12/25/2022 10:27    Procedures Procedures  {Document cardiac monitor, telemetry assessment procedure when appropriate:1}  Medications Ordered in ED Medications  ketorolac (TORADOL) 30 MG/ML injection 30 mg (30 mg Intravenous Given 12/25/22 1102)  metoCLOPramide (REGLAN) injection 10 mg (10 mg Intravenous Given 12/25/22 1102)  diphenhydrAMINE (BENADRYL) injection 25 mg (25 mg Intravenous Given 12/25/22 1102)    ED Course/ Medical Decision Making/ A&P   {   Click here for ABCD2, HEART and other calculatorsREFRESH Note before signing :1}                          Medical Decision Making Amount and/or Complexity of Data Reviewed Radiology: ordered.  Risk Prescription drug management.   Patient improved with migraine cocktail.  CT scan negative.  She will follow-up with her PCP  {Document critical care time when appropriate:1} {Document review of labs and clinical decision tools ie heart score, Chads2Vasc2 etc:1}  {Document your independent review of radiology images, and any outside records:1} {Document your discussion with family members, caretakers, and with consultants:1} {Document social determinants of health affecting pt's care:1} {Document your decision making why or why not admission, treatments were needed:1} Final Clinical Impression(s) / ED Diagnoses Final diagnoses:  None    Rx / DC Orders ED Discharge Orders     None

## 2022-12-25 NOTE — Discharge Instructions (Signed)
Follow up with your family md if any problems

## 2022-12-31 DIAGNOSIS — F411 Generalized anxiety disorder: Secondary | ICD-10-CM | POA: Diagnosis not present

## 2023-01-05 ENCOUNTER — Encounter: Payer: Self-pay | Admitting: Neurology

## 2023-01-05 ENCOUNTER — Ambulatory Visit (INDEPENDENT_AMBULATORY_CARE_PROVIDER_SITE_OTHER): Payer: BC Managed Care – PPO | Admitting: Neurology

## 2023-01-05 VITALS — BP 107/73 | HR 69 | Ht 63.0 in | Wt 182.0 lb

## 2023-01-05 DIAGNOSIS — G43711 Chronic migraine without aura, intractable, with status migrainosus: Secondary | ICD-10-CM | POA: Diagnosis not present

## 2023-01-05 DIAGNOSIS — G43109 Migraine with aura, not intractable, without status migrainosus: Secondary | ICD-10-CM

## 2023-01-05 DIAGNOSIS — F39 Unspecified mood [affective] disorder: Secondary | ICD-10-CM

## 2023-01-05 MED ORDER — TIZANIDINE HCL 2 MG PO TABS: 2.0000 mg | ORAL_TABLET | Freq: Four times a day (QID) | ORAL | 1 refills | Status: DC | PRN

## 2023-01-05 MED ORDER — UBRELVY 100 MG PO TABS: 100.0000 mg | ORAL_TABLET | ORAL | 11 refills | Status: AC | PRN

## 2023-01-05 MED ORDER — AJOVY 225 MG/1.5ML ~~LOC~~ SOAJ: 225.0000 mg | SUBCUTANEOUS | 11 refills | Status: DC
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
## 2023-01-05 NOTE — Patient Instructions (Addendum)
Stop the Nurtec once you get Ajovy, will use Ajovy for migraine prevention  Try Roselyn Meier for acute headache  Will arrange for follow up in 4 months Check MRI of your cervical spine

## 2023-01-06 DIAGNOSIS — F411 Generalized anxiety disorder: Secondary | ICD-10-CM | POA: Diagnosis not present

## 2023-01-12 DIAGNOSIS — R112 Nausea with vomiting, unspecified: Secondary | ICD-10-CM | POA: Diagnosis not present

## 2023-01-12 DIAGNOSIS — R197 Diarrhea, unspecified: Secondary | ICD-10-CM | POA: Diagnosis not present

## 2023-01-13 ENCOUNTER — Telehealth: Payer: Self-pay | Admitting: Neurology

## 2023-01-13 NOTE — Telephone Encounter (Signed)
Dillon Bjork: [id number] exp. 01/13/23-02/11/23, [place] saying not currently in effect. Sent to [facility] [phone]

## 2023-01-18 ENCOUNTER — Encounter: Payer: Self-pay | Admitting: Neurology

## 2023-01-18 ENCOUNTER — Telehealth: Payer: Self-pay

## 2023-01-18 NOTE — Telephone Encounter (Signed)
Patient has sent a mychart requesting updates on the PA requests for Ajovy & Ubrelvy.

## 2023-01-21 DIAGNOSIS — F411 Generalized anxiety disorder: Secondary | ICD-10-CM | POA: Diagnosis not present

## 2023-01-21 NOTE — Telephone Encounter (Signed)
Patient Advocate Encounter   Received notification that prior authorization for Ubrelvy 100MG  tablets is required.   PA submitted on 01/21/2023 [place] [employer] Medicaid Electronic Prior Authorization Form Status is pending       Lyndel Safe, [place] Patient Advocate Specialist [place] Patient Advocate Team Direct Number: [phone]  Fax: [phone]

## 2023-01-25 NOTE — Telephone Encounter (Signed)
Patient Advocate Encounter  Prior Authorization for Mary Parker 100MG  tablets has been approved.    PA# [id number] Insurance [employer] Electronic Prior Authorization Form Effective dates: 01/21/2023 through 01/21/2024      Lyndel Safe, [place] Patient Advocate Specialist [place] Patient Advocate Team Direct Number: [phone]  Fax: [phone]

## 2023-01-25 NOTE — Telephone Encounter (Signed)
Mychart msg sent to notify pt that: Prior Authorization for Ubrelvy 100MG  tablets has been approved

## 2023-01-26 ENCOUNTER — Other Ambulatory Visit (HOSPITAL_COMMUNITY): Payer: Self-pay

## 2023-01-26 NOTE — Telephone Encounter (Signed)
Pharmacy Patient Advocate Encounter   Received notification from [place] that prior authorization for AJOVY (fremanezumab-vfrm) injection 225MG /1.5ML auto-injectors is required/requested.   PA submitted on 01/26/2023 to (ins) [employer] via CoverMyMeds Key or (Medicaid) confirmation # [id number] Status is pending

## 2023-01-31 ENCOUNTER — Other Ambulatory Visit (HOSPITAL_COMMUNITY): Payer: Self-pay

## 2023-01-31 NOTE — Telephone Encounter (Addendum)
Pharmacy Patient Advocate Encounter  Prior Authorization for AJOVY (fremanezumab-vfrm) injection 225MG /1.5ML auto-injectors has been approved by [employer] (ins).    PA # PA Case ID #: [id number] Effective dates: 01/26/2023 through 04/28/2023  Copay is $4.00

## 2023-02-04 DIAGNOSIS — F411 Generalized anxiety disorder: Secondary | ICD-10-CM | POA: Diagnosis not present

## 2023-02-05 ENCOUNTER — Encounter: Payer: Self-pay | Admitting: Internal Medicine

## 2023-02-09 ENCOUNTER — Encounter: Payer: Self-pay | Admitting: Neurology

## 2023-02-10 ENCOUNTER — Ambulatory Visit (HOSPITAL_COMMUNITY)
Admission: RE | Admit: 2023-02-10 | Discharge: 2023-02-10 | Disposition: A | Discharge status: Home / Self Care | Payer: BC Managed Care – PPO | Source: Ambulatory Visit | Attending: Neurology | Admitting: Neurology

## 2023-02-10 DIAGNOSIS — G43711 Chronic migraine without aura, intractable, with status migrainosus: Secondary | ICD-10-CM | POA: Diagnosis not present

## 2023-02-10 DIAGNOSIS — M542 Cervicalgia: Secondary | ICD-10-CM | POA: Diagnosis not present

## 2023-02-15 ENCOUNTER — Other Ambulatory Visit: Payer: Self-pay | Admitting: Internal Medicine

## 2023-02-15 DIAGNOSIS — M5440 Lumbago with sciatica, unspecified side: Secondary | ICD-10-CM

## 2023-02-21 DIAGNOSIS — I10 Essential (primary) hypertension: Secondary | ICD-10-CM | POA: Diagnosis not present

## 2023-02-21 DIAGNOSIS — G43909 Migraine, unspecified, not intractable, without status migrainosus: Secondary | ICD-10-CM | POA: Diagnosis not present

## 2023-02-21 DIAGNOSIS — R519 Headache, unspecified: Secondary | ICD-10-CM | POA: Diagnosis not present

## 2023-02-21 DIAGNOSIS — M542 Cervicalgia: Secondary | ICD-10-CM | POA: Diagnosis not present

## 2023-02-21 DIAGNOSIS — L989 Disorder of the skin and subcutaneous tissue, unspecified: Secondary | ICD-10-CM | POA: Diagnosis not present

## 2023-02-21 NOTE — Telephone Encounter (Signed)
UHC mediciad, her secondary insurance, denied this MRI.

## 2023-02-25 DIAGNOSIS — F411 Generalized anxiety disorder: Secondary | ICD-10-CM | POA: Diagnosis not present

## 2023-03-03 DIAGNOSIS — M542 Cervicalgia: Secondary | ICD-10-CM | POA: Diagnosis not present

## 2023-03-07 DIAGNOSIS — Z0001 Encounter for general adult medical examination with abnormal findings: Secondary | ICD-10-CM | POA: Diagnosis not present

## 2023-03-07 DIAGNOSIS — F419 Anxiety disorder, unspecified: Secondary | ICD-10-CM | POA: Diagnosis not present

## 2023-03-07 DIAGNOSIS — I10 Essential (primary) hypertension: Secondary | ICD-10-CM | POA: Diagnosis not present

## 2023-03-07 DIAGNOSIS — M542 Cervicalgia: Secondary | ICD-10-CM | POA: Diagnosis not present

## 2023-03-07 DIAGNOSIS — G43909 Migraine, unspecified, not intractable, without status migrainosus: Secondary | ICD-10-CM | POA: Diagnosis not present

## 2023-03-07 DIAGNOSIS — R519 Headache, unspecified: Secondary | ICD-10-CM | POA: Diagnosis not present

## 2023-03-07 DIAGNOSIS — E782 Mixed hyperlipidemia: Secondary | ICD-10-CM | POA: Diagnosis not present

## 2023-03-18 DIAGNOSIS — F4312 Post-traumatic stress disorder, chronic: Secondary | ICD-10-CM | POA: Diagnosis not present

## 2023-03-18 DIAGNOSIS — M545 Low back pain, unspecified: Secondary | ICD-10-CM | POA: Diagnosis not present

## 2023-03-18 DIAGNOSIS — F411 Generalized anxiety disorder: Secondary | ICD-10-CM | POA: Diagnosis not present

## 2023-03-25 ENCOUNTER — Other Ambulatory Visit: Payer: Self-pay | Admitting: Orthopedic Surgery

## 2023-03-25 DIAGNOSIS — M5440 Lumbago with sciatica, unspecified side: Secondary | ICD-10-CM

## 2023-04-06 ENCOUNTER — Other Ambulatory Visit: Payer: Self-pay | Admitting: Orthopedic Surgery

## 2023-04-06 DIAGNOSIS — M5440 Lumbago with sciatica, unspecified side: Secondary | ICD-10-CM

## 2023-04-08 DIAGNOSIS — F4312 Post-traumatic stress disorder, chronic: Secondary | ICD-10-CM | POA: Diagnosis not present

## 2023-04-12 DIAGNOSIS — M545 Low back pain, unspecified: Secondary | ICD-10-CM | POA: Diagnosis not present

## 2023-04-15 DIAGNOSIS — F4312 Post-traumatic stress disorder, chronic: Secondary | ICD-10-CM | POA: Diagnosis not present

## 2023-04-21 DIAGNOSIS — F4312 Post-traumatic stress disorder, chronic: Secondary | ICD-10-CM | POA: Diagnosis not present

## 2023-04-22 ENCOUNTER — Ambulatory Visit
Admission: RE | Admit: 2023-04-22 | Discharge: 2023-04-22 | Disposition: A | Discharge status: Home / Self Care | Payer: BC Managed Care – PPO | Source: Ambulatory Visit | Attending: Orthopedic Surgery | Admitting: Orthopedic Surgery

## 2023-04-22 DIAGNOSIS — M5440 Lumbago with sciatica, unspecified side: Secondary | ICD-10-CM

## 2023-04-22 DIAGNOSIS — M533 Sacrococcygeal disorders, not elsewhere classified: Secondary | ICD-10-CM | POA: Diagnosis not present

## 2023-04-22 MED ORDER — METHYLPREDNISOLONE ACETATE 80 MG/ML IJ SUSP: 80.0000 mg | Freq: Once | INTRAMUSCULAR | Status: DC

## 2023-04-25 DIAGNOSIS — F4312 Post-traumatic stress disorder, chronic: Secondary | ICD-10-CM | POA: Diagnosis not present

## 2023-05-13 ENCOUNTER — Ambulatory Visit
Admission: EM | Admit: 2023-05-13 | Discharge: 2023-05-13 | Disposition: A | Discharge status: Home / Self Care | Payer: BC Managed Care – PPO | Attending: Family Medicine | Admitting: Family Medicine

## 2023-05-13 DIAGNOSIS — J069 Acute upper respiratory infection, unspecified: Secondary | ICD-10-CM | POA: Diagnosis not present

## 2023-05-13 DIAGNOSIS — Z1152 Encounter for screening for COVID-19: Secondary | ICD-10-CM | POA: Diagnosis not present

## 2023-05-13 NOTE — ED Provider Notes (Signed)
[facility]    CSN: [id number] Arrival date & time: 05/13/23  1420      History   Chief Complaint No chief complaint on file.   HPI Mary Parker is a 42 y.o. female.   Patient presenting today with 1 day history of fatigue, headache, sore throat.  She denies known fever, chills, cough, chest pain, shortness of breath, abdominal pain, nausea vomiting or diarrhea.  States she had oral surgery done 2 days ago, the symptoms started yesterday and she called her oral surgeon who feels they are unrelated and that she may have COVID to recommended she get tested.  So far taking the ketorolac and Augmentin she was given for surgery, nothing else for symptoms.    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/2021   11/09/21 needs colposcopy   IBS (irritable bowel syndrome)    Knee pain, right    Migraines    Rebound headache 12/19/2014   Right ovarian cyst 04/25/2017   Complex, check CA 125 and repeat US in 6 weeks    Sciatica of left side    Seizures (HCC)    remote past, felt it was related to Chantix    Sleep apnea    Stroke (HCC)    migraine induced stroke   Vitamin D deficiency    Wears glasses     Patient Active Problem List   Diagnosis Date Noted   Mood disorder (HCC) 01/05/2023   Hemorrhoids, thrombosed 04/09/2022   Prolapsed internal hemorrhoids, grade 2 11/25/2021   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/09/2021   Rectal pain 12/22/2020   Nausea without vomiting 12/22/2020    Early satiety 12/22/2020   Loss of weight 12/22/2020   RUQ abdominal pain 10/15/2020   OSA (obstructive sleep apnea) 09/08/2020   Sleep walking and eating 07/30/2020   Excessive daytime sleepiness 07/30/2020   Class 3 severe obesity due to excess calories without serious comorbidity with body mass index (BMI) of 40.0 to 44.9 in adult (HCC) 07/30/2020   Psychophysiological insomnia 07/30/2020   Loud snoring 07/30/2020   Chronic migraine without aura, with intractable migraine, so stated, with status migrainosus 07/12/2019   Cigarette smoker 05/04/2019   Obesity, Class II, BMI 35-39.9 05/04/2019   Stroke-like episode (HCC) s/p tPA 05/03/2019   Acute gastroenteritis 03/29/2019   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   S/P abdominal supracervical subtotal hysterectomy 06/21/2017   Pelvic adhesions 06/09/2017   History of ovarian cyst 06/09/2017   Right ovarian cyst 04/25/2017   Rectal bleeding 04/02/2017   LLQ pain 04/01/2017   Constipation 04/01/2017   Concussion with loss of consciousness 11/16/2016   Anxiety 10/14/2016   Chronic low back pain 05/29/2015   Complicated migraine 12/19/2014   Fibromyalgia 12/19/2014   Rebound headache 12/19/2014   Depression 12/11/2014   Body aches 12/11/2014    Past Surgical History:  Procedure Laterality Date   BILATERAL SALPINGECTOMY Bilateral 06/21/2017  Procedure: BILATERAL SALPINGECTOMY;  Surgeon: Tilda Burrow, MD;  Location: [facility];  Service: Gynecology;  Laterality: Bilateral;   CESAREAN SECTION  [phone]   CHOLECYSTECTOMY  2001   COLONOSCOPY WITH PROPOFOL N/A 04/07/2017   Dr. Lovena Neighbours: Normal, abnormal CT likely artifactual   ENDOMETRIAL ABLATION     ESOPHAGOGASTRODUODENOSCOPY  2002   [facility]: normal   KNEE ARTHROSCOPY WITH LATERAL RELEASE Right 04/18/2018   Procedure: Right knee arthroscopy with chondroplasty and lateral release;  Surgeon: Yolonda Kida, MD;  Location: [facility];  Service:  Orthopedics;  Laterality: Right;  60 mins   LASER ABLATION CONDOLAMATA N/A 12/30/2021   Procedure: LASER ABLATION of the cervix;  Surgeon: Lazaro Arms, MD;  Location: [facility];  Service: Gynecology;  Laterality: N/A;   MOUTH BIOPSY     MOUTH SURGERY     SUPRACERVICAL ABDOMINAL HYSTERECTOMY N/A 06/21/2017   Procedure: HYSTERECTOMY SUPRACERVICAL ABDOMINAL;  Surgeon: Tilda Burrow, MD;  Location: [facility];  Service: Gynecology;  Laterality: N/A;   TUBAL LIGATION      OB History     Gravida  3   Para  3   Term  3   Preterm      AB      Living  3      SAB      IAB      Ectopic      Multiple      Live Births  3            Home Medications    Prior to Admission medications   Medication Sig Start Date End Date Taking? Authorizing Provider  amoxicillin (AMOXIL) 500 MG capsule Take 500 mg by mouth 3 (three) times daily. 03/24/23  Yes [provider]  ALPRAZolam Prudy Feeler) 1 MG tablet Take 1 mg by mouth 2 (two) times daily. 09/21/21   [provider]  budesonide-formoterol (SYMBICORT) 160-4.5 MCG/ACT inhaler Inhale 2 puffs into the lungs 2 (two) times daily as needed (for flares).    [provider]  cyanocobalamin (,VITAMIN B-12,) 1000 MCG/ML injection Inject 1,000 mcg into the skin every 30 (thirty) days.  02/17/18   [provider]  dicyclomine (BENTYL) 10 MG capsule TAKE 1 CAPSULE BY MOUTH UP TO THREE TIMES DAILY AS NEEDED FOR SPASMS OR ABDOMINAL PAIN Patient taking differently: as needed. TAKE 1 CAPSULE BY MOUTH UP TO THREE TIMES DAILY AS NEEDED FOR SPASMS OR ABDOMINAL PAIN 08/13/22   Letta Median, PA-C  Fremanezumab-vfrm (AJOVY) 225 MG/1.5ML SOAJ Inject 225 mg into the skin every 30 (thirty) days. 01/05/23   Glean Salvo, NP  hydrOXYzine (ATARAX) 50 MG tablet Take 50 mg by mouth at bedtime. 07/19/22   [provider]  methocarbamol (ROBAXIN) 750 MG tablet Take 750 mg by mouth at bedtime.    [provider]   omeprazole (PRILOSEC) 20 MG capsule Take 20 mg by mouth daily. 12/18/21   [provider]  ondansetron (ZOFRAN-ODT) 4 MG disintegrating tablet DISSOLVE 1 TABLET(4 MG) ON THE TONGUE EVERY 8 HOURS AS NEEDED FOR NAUSEA OR VOMITING 07/21/21   York Spaniel, MD  SERTRALINE HCL PO Take 75 mg by mouth daily. 07/10/22   [provider]  tiZANidine (ZANAFLEX) 2 MG tablet Take 1 tablet (2 mg total) by mouth every 6 (six) hours as needed for muscle spasms (as needed for migraine cocktial). 01/05/23   Glean Salvo, NP  Ubrogepant (UBRELVY) 100 MG TABS Take 1 tablet (100  mg total) by mouth as needed (take 1 tablet at onset of headache, may repeat 2 hours after initial dose, max is 200 mg/24 hours). 01/05/23   Glean Salvo, NP  Vitamin D, Ergocalciferol, (DRISDOL) 50000 units CAPS capsule Take 50,000 Units by mouth every Sunday. 02/18/18   [provider]    Family History Family History  Problem Relation Age of Onset   Cancer Maternal Grandmother    Osteoporosis Maternal Grandmother    Cancer Maternal Grandfather    Diabetes Father    Hypertension Father    Alcohol abuse Father    Breast cancer Mother    Hypertension Mother    Other Mother        abnormal cells; had hyst   Obesity Daughter    Diabetes Daughter 61       youngest   Obesity Daughter    Obesity Son    Colon cancer Neg Hx     Social History Social History   Tobacco Use   Smoking status: Former    Current packs/day: 0.00    Average packs/day: 0.3 packs/day for 25.0 years (6.3 ttl pk-yrs)    Types: Cigarettes    Start date: 04/01/1994    Quit date: 04/02/2019    Years since quitting: 4.1   Smokeless tobacco: Never  Vaping Use   Vaping status: Never Used  Substance Use Topics   Alcohol use: No   Drug use: Yes    Types: Marijuana     Allergies   Bupropion, Duloxetine hcl, Gabapentin, Meloxicam, Pregabalin, Amitriptyline, Ciprofloxacin, Desvenlafaxine succinate er, Hydrocodone-acetaminophen,  Milnacipran, Nortriptyline, Topiramate, Decadron [dexamethasone], Triptans, Doxycycline, Percocet [oxycodone-acetaminophen], and Sumatriptan   Review of Systems Review of Systems Per HPI  Physical Exam Triage Vital Signs ED Triage Vitals  Encounter Vitals Group     BP 05/13/23 1543 118/86     Systolic BP Percentile --      Diastolic BP Percentile --      Pulse Rate 05/13/23 1543 65     Resp 05/13/23 1543 13     Temp 05/13/23 1543 98.4 F (36.9 C)     Temp Source 05/13/23 1543 Oral     SpO2 05/13/23 1543 98 %     Weight --      Height --      Head Circumference --      Peak Flow --      Pain Score 05/13/23 1545 7     Pain Loc --      Pain Education --      Exclude from Growth Chart --    No data found.  Updated Vital Signs BP 118/86 (BP Location: Right Arm)   Pulse 65   Temp 98.4 F (36.9 C) (Oral)   Resp 13   SpO2 98%   Visual Acuity Right Eye Distance:   Left Eye Distance:   Bilateral Distance:    Right Eye Near:   Left Eye Near:    Bilateral Near:     Physical Exam Vitals and nursing note reviewed.  Constitutional:      Appearance: Normal appearance. She is not ill-appearing.  HENT:     Head: Atraumatic.     Right Ear: Tympanic membrane normal.     Left Ear: Tympanic membrane normal.     Nose: Nose normal.     Mouth/Throat:     Mouth: Mucous membranes are moist.     Pharynx: Oropharynx is clear. Posterior oropharyngeal erythema present.  Eyes:  Extraocular Movements: Extraocular movements intact.     Conjunctiva/sclera: Conjunctivae normal.  Cardiovascular:     Rate and Rhythm: Normal rate and regular rhythm.     Heart sounds: Normal heart sounds.  Pulmonary:     Effort: Pulmonary effort is normal.     Breath sounds: Normal breath sounds.  Musculoskeletal:        General: Normal range of motion.     Cervical back: Normal range of motion and neck supple.  Skin:    General: Skin is warm and dry.  Neurological:     Mental Status: She is  alert and oriented to person, place, and time.  Psychiatric:        Mood and Affect: Mood normal.        Thought Content: Thought content normal.        Judgment: Judgment normal.      UC Treatments / Results  Labs (all labs ordered are listed, but only abnormal results are displayed) Labs Reviewed  SARS CORONAVIRUS 2 (TAT 6-24 HRS)    EKG   Radiology No results found.  Procedures Procedures (including critical care time)  Medications Ordered in UC Medications - No data to display  Initial Impression / Assessment and Plan / UC Course  I have reviewed the triage vital signs and the nursing notes.  Pertinent labs & imaging results that were available during my care of the patient were reviewed by me and considered in my medical decision making (see chart for details).     Vitals and exam overall reassuring, suspect viral respiratory infection.  COVID testing pending, good candidate for antiviral therapy if positive.  Discussed supportive over-the-counter medications and home care additionally.  Return for worsening symptoms.  Final Clinical Impressions(s) / UC Diagnoses   Final diagnoses:  Viral URI     Discharge Instructions      We have tested you for COVID today.  These results should be available tomorrow and if positive, somebody should reach out to discuss antiviral therapy.  Continue supportive over-the-counter medications and your anti-inflammatory pain medication.    ED Prescriptions   None    PDMP not reviewed this encounter.   Particia Nearing, [place] 05/13/23 1611

## 2023-05-13 NOTE — ED Triage Notes (Signed)
Pt c/o pt states she she was instructed by her oral surgeon that she may have COVID and she needs to be tested. Sx's headache, sore throat fatigue.

## 2023-05-13 NOTE — Discharge Instructions (Signed)
We have tested you for COVID today.  These results should be available tomorrow and if positive, somebody should reach out to discuss antiviral therapy.  Continue supportive over-the-counter medications and your anti-inflammatory pain medication.

## 2023-05-14 LAB — SARS CORONAVIRUS 2 (TAT 6-24 HRS): SARS Coronavirus 2: NEGATIVE

## 2023-05-18 ENCOUNTER — Encounter: Payer: Self-pay | Admitting: Neurology

## 2023-05-18 ENCOUNTER — Ambulatory Visit (INDEPENDENT_AMBULATORY_CARE_PROVIDER_SITE_OTHER): Payer: BC Managed Care – PPO | Admitting: Neurology

## 2023-05-18 VITALS — BP 122/84 | HR 60 | Ht 62.0 in | Wt 178.0 lb

## 2023-05-18 DIAGNOSIS — G43109 Migraine with aura, not intractable, without status migrainosus: Secondary | ICD-10-CM

## 2023-05-18 DIAGNOSIS — R634 Abnormal weight loss: Secondary | ICD-10-CM

## 2023-05-18 DIAGNOSIS — G4733 Obstructive sleep apnea (adult) (pediatric): Secondary | ICD-10-CM | POA: Diagnosis not present
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
## 2023-05-20 DIAGNOSIS — F4312 Post-traumatic stress disorder, chronic: Secondary | ICD-10-CM | POA: Diagnosis not present

## 2023-06-10 DIAGNOSIS — F4312 Post-traumatic stress disorder, chronic: Secondary | ICD-10-CM | POA: Diagnosis not present

## 2023-06-13 DIAGNOSIS — G43719 Chronic migraine without aura, intractable, without status migrainosus: Secondary | ICD-10-CM | POA: Diagnosis not present

## 2023-06-13 DIAGNOSIS — Z049 Encounter for examination and observation for unspecified reason: Secondary | ICD-10-CM | POA: Diagnosis not present

## 2023-06-13 DIAGNOSIS — Z79899 Other long term (current) drug therapy: Secondary | ICD-10-CM | POA: Diagnosis not present

## 2023-06-14 DIAGNOSIS — G43909 Migraine, unspecified, not intractable, without status migrainosus: Secondary | ICD-10-CM | POA: Diagnosis not present

## 2023-06-15 ENCOUNTER — Ambulatory Visit: Payer: BC Managed Care – PPO | Admitting: Neurology

## 2023-06-15 DIAGNOSIS — R634 Abnormal weight loss: Secondary | ICD-10-CM

## 2023-06-15 DIAGNOSIS — G4733 Obstructive sleep apnea (adult) (pediatric): Secondary | ICD-10-CM

## 2023-06-15 DIAGNOSIS — G43109 Migraine with aura, not intractable, without status migrainosus: Secondary | ICD-10-CM

## 2023-06-17 ENCOUNTER — Encounter: Payer: Self-pay | Admitting: Neurology

## 2023-06-17 DIAGNOSIS — F4312 Post-traumatic stress disorder, chronic: Secondary | ICD-10-CM | POA: Diagnosis not present

## 2023-06-20 DIAGNOSIS — G43719 Chronic migraine without aura, intractable, without status migrainosus: Secondary | ICD-10-CM | POA: Diagnosis not present

## 2023-06-20 DIAGNOSIS — M791 Myalgia, unspecified site: Secondary | ICD-10-CM | POA: Diagnosis not present

## 2023-06-20 DIAGNOSIS — M542 Cervicalgia: Secondary | ICD-10-CM | POA: Diagnosis not present

## 2023-06-20 DIAGNOSIS — G518 Other disorders of facial nerve: Secondary | ICD-10-CM | POA: Diagnosis not present

## 2023-06-22 ENCOUNTER — Encounter: Payer: Self-pay | Admitting: Neurology

## 2023-06-23 NOTE — Progress Notes (Signed)
[facility] Mary Parker 42 year old female 12-26-1980   HOME SLEEP TEST REPORT ( MAIL OUT by Watch PAT)   STUDY DATA:  06-19-2023   ORDERING CLINICIAN:  Melvyn Novas, MD  REFERRING CLINICIAN: Loni Dolly. Primary Neurologist not assigned.  PCP: Nita Sells, MD    CLINICAL INFORMATION/HISTORY: 05-18-2023:  Presenting patient with migraine, cervicalgia and difficulties with BiPAP use.  Patient lost a significant amount of weight, retesting if apnea is still present.  Mary Parker is a 42 y.o. female patient who is here for prolonged revisit 05/18/2023 for  OSA treatment and retesting.  She has extremely small nostrils - a very tiny nose and she couldn't fit any nasal pillows. She had undergone a sleep study- .08-06-2020, diagnosed with OSA. 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  She works late shifts and  did all the night time care.  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  She would like to re-start CPAP.      Epworth sleepiness score: 12/24.   BMI: 33 kg/m   Neck Circumference:  14"   FINDINGS:   Sleep Summary:   Total Recording Time (hours, min):  8 h 31 m      Total Sleep Time (hours, min):   6 h 20 m              Percent REM (%):  21.2%                                      Respiratory Indices:   Calculated pAHI (per hour):   3.3/h                          REM pAHI: 5.3/h                                                NREM pAHI:    2.8/h                           Positional  AHI:   supine AHI was 4.5/h and non supine was 0/h.                                                 Oxygen Saturation Statistics:    O2 Saturation Range (%): Mean saturation was 96%,  between a nadir of 92% AND MAX SATURATION OF 100%.                                      O2 Saturation (minutes) <89%:  0 minutes         Pulse Rate Statistics:   Pulse Mean (bpm):  56 bpm               Pulse Range:     between 44 and 92 bpm.              IMPRESSION:  This HST confirms that sleep  apnea is no longer present.    RECOMMENDATION: No CPAP treatment is needed.     INTERPRETING PHYSICIAN:   Melvyn Novas, MD

## 2023-06-24 ENCOUNTER — Encounter: Payer: Self-pay | Admitting: Neurology

## 2023-06-24 DIAGNOSIS — G43109 Migraine with aura, not intractable, without status migrainosus: Secondary | ICD-10-CM

## 2023-06-24 DIAGNOSIS — G43711 Chronic migraine without aura, intractable, with status migrainosus: Secondary | ICD-10-CM

## 2023-06-24 DIAGNOSIS — F4312 Post-traumatic stress disorder, chronic: Secondary | ICD-10-CM | POA: Diagnosis not present

## 2023-06-27 ENCOUNTER — Other Ambulatory Visit (HOSPITAL_COMMUNITY): Payer: Self-pay | Admitting: Obstetrics & Gynecology

## 2023-06-27 DIAGNOSIS — R634 Abnormal weight loss: Secondary | ICD-10-CM | POA: Insufficient documentation

## 2023-06-27 DIAGNOSIS — Z1231 Encounter for screening mammogram for malignant neoplasm of breast: Secondary | ICD-10-CM

## 2023-06-27 NOTE — Procedures (Signed)
[facility] JAYNE CENTENO 42 year old female 12-26-1980   HOME SLEEP TEST REPORT ( MAIL OUT by Watch PAT)   STUDY DATA:  06-19-2023   ORDERING CLINICIAN:  Melvyn Novas, MD  REFERRING CLINICIAN: Loni Dolly. Primary Neurologist not assigned.  PCP: Nita Sells, MD    CLINICAL INFORMATION/HISTORY: 05-18-2023:  Presenting patient with migraine, cervicalgia and difficulties with BiPAP use.  Patient lost a significant amount of weight, retesting if apnea is still present.  LAQUISHA MASTRANDREA is a 42 y.o. female patient who is here for prolonged revisit 05/18/2023 for  OSA treatment and retesting.  She has extremely small nostrils - a very tiny nose and she couldn't fit any nasal pillows. She had undergone a sleep study- .08-06-2020, diagnosed with OSA. 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  She works late shifts and  did all the night time care.  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  She would like to re-start CPAP.      Epworth sleepiness score: 12/24.   BMI: 33 kg/m   Neck Circumference:  14"   FINDINGS:   Sleep Summary:   Total Recording Time (hours, min):  8 h 31 m      Total Sleep Time (hours, min):   6 h 20 m              Percent REM (%):  21.2%                                      Respiratory Indices:   Calculated pAHI (per hour):   3.3/h                          REM pAHI: 5.3/h                                                NREM pAHI:    2.8/h                           Positional  AHI:   supine AHI was 4.5/h and non supine was 0/h.                                                 Oxygen Saturation Statistics:    O2 Saturation Range (%): Mean saturation was 96%,  between a nadir of 92% AND MAX SATURATION OF 100%.                                      O2 Saturation (minutes) <89%:  0 minutes         Pulse Rate Statistics:   Pulse Mean (bpm):  56 bpm               Pulse Range:     between 44 and 92 bpm.              IMPRESSION:  This HST confirms that sleep  apnea is no longer present.    RECOMMENDATION: No CPAP treatment is needed.     INTERPRETING PHYSICIAN:   Melvyn Novas, MD

## 2023-06-27 NOTE — Addendum Note (Signed)
Addended by: Glean Salvo on: 06/27/2023 03:41 PM   Modules accepted: Orders

## 2023-06-28 ENCOUNTER — Telehealth: Payer: Self-pay | Admitting: Neurology

## 2023-06-28 NOTE — Telephone Encounter (Signed)
Referral for ophthalmology fax to [facility] as request by NP. Phone: [phone], Fax: [phone]

## 2023-06-29 ENCOUNTER — Ambulatory Visit (HOSPITAL_COMMUNITY)
Admission: RE | Admit: 2023-06-29 | Discharge: 2023-06-29 | Disposition: A | Discharge status: Home / Self Care | Payer: BC Managed Care – PPO | Source: Ambulatory Visit | Attending: Obstetrics & Gynecology | Admitting: Obstetrics & Gynecology

## 2023-06-29 DIAGNOSIS — Z1231 Encounter for screening mammogram for malignant neoplasm of breast: Secondary | ICD-10-CM | POA: Insufficient documentation

## 2023-07-06 DIAGNOSIS — M542 Cervicalgia: Secondary | ICD-10-CM | POA: Diagnosis not present

## 2023-07-06 DIAGNOSIS — G518 Other disorders of facial nerve: Secondary | ICD-10-CM | POA: Diagnosis not present

## 2023-07-06 DIAGNOSIS — G43719 Chronic migraine without aura, intractable, without status migrainosus: Secondary | ICD-10-CM | POA: Diagnosis not present

## 2023-07-06 DIAGNOSIS — M791 Myalgia, unspecified site: Secondary | ICD-10-CM | POA: Diagnosis not present

## 2023-07-08 DIAGNOSIS — F4312 Post-traumatic stress disorder, chronic: Secondary | ICD-10-CM | POA: Diagnosis not present

## 2023-07-12 DIAGNOSIS — G629 Polyneuropathy, unspecified: Secondary | ICD-10-CM | POA: Diagnosis not present

## 2023-07-13 DIAGNOSIS — G5603 Carpal tunnel syndrome, bilateral upper limbs: Secondary | ICD-10-CM | POA: Diagnosis not present

## 2023-07-13 DIAGNOSIS — F4312 Post-traumatic stress disorder, chronic: Secondary | ICD-10-CM | POA: Diagnosis not present

## 2023-07-21 DIAGNOSIS — G518 Other disorders of facial nerve: Secondary | ICD-10-CM | POA: Diagnosis not present

## 2023-07-21 DIAGNOSIS — G43719 Chronic migraine without aura, intractable, without status migrainosus: Secondary | ICD-10-CM | POA: Diagnosis not present

## 2023-07-21 DIAGNOSIS — M542 Cervicalgia: Secondary | ICD-10-CM | POA: Diagnosis not present

## 2023-07-21 DIAGNOSIS — M791 Myalgia, unspecified site: Secondary | ICD-10-CM | POA: Diagnosis not present

## 2023-07-22 DIAGNOSIS — F4312 Post-traumatic stress disorder, chronic: Secondary | ICD-10-CM | POA: Diagnosis not present

## 2023-07-25 DIAGNOSIS — H5340 Unspecified visual field defects: Secondary | ICD-10-CM | POA: Diagnosis not present

## 2023-08-01 DIAGNOSIS — M533 Sacrococcygeal disorders, not elsewhere classified: Secondary | ICD-10-CM | POA: Diagnosis not present

## 2023-08-04 ENCOUNTER — Other Ambulatory Visit: Payer: Self-pay | Admitting: Orthopedic Surgery

## 2023-08-04 DIAGNOSIS — G518 Other disorders of facial nerve: Secondary | ICD-10-CM | POA: Diagnosis not present

## 2023-08-04 DIAGNOSIS — M545 Low back pain, unspecified: Secondary | ICD-10-CM

## 2023-08-04 DIAGNOSIS — M791 Myalgia, unspecified site: Secondary | ICD-10-CM | POA: Diagnosis not present

## 2023-08-04 DIAGNOSIS — M542 Cervicalgia: Secondary | ICD-10-CM | POA: Diagnosis not present

## 2023-08-04 DIAGNOSIS — G43719 Chronic migraine without aura, intractable, without status migrainosus: Secondary | ICD-10-CM | POA: Diagnosis not present

## 2023-08-05 ENCOUNTER — Encounter: Payer: Self-pay | Admitting: Orthopedic Surgery

## 2023-08-05 DIAGNOSIS — F4311 Post-traumatic stress disorder, acute: Secondary | ICD-10-CM | POA: Diagnosis not present

## 2023-08-10 DIAGNOSIS — F4312 Post-traumatic stress disorder, chronic: Secondary | ICD-10-CM | POA: Diagnosis not present

## 2023-08-12 ENCOUNTER — Ambulatory Visit
Admission: RE | Admit: 2023-08-12 | Discharge: 2023-08-12 | Disposition: A | Discharge status: Home / Self Care | Payer: BC Managed Care – PPO | Source: Ambulatory Visit | Attending: Orthopedic Surgery | Admitting: Orthopedic Surgery

## 2023-08-12 DIAGNOSIS — M545 Low back pain, unspecified: Secondary | ICD-10-CM

## 2023-08-12 DIAGNOSIS — M533 Sacrococcygeal disorders, not elsewhere classified: Secondary | ICD-10-CM | POA: Diagnosis not present

## 2023-08-17 ENCOUNTER — Other Ambulatory Visit: Payer: Self-pay

## 2023-08-17 ENCOUNTER — Ambulatory Visit
Admission: EM | Admit: 2023-08-17 | Discharge: 2023-08-17 | Disposition: A | Discharge status: Home / Self Care | Payer: BC Managed Care – PPO | Attending: Family Medicine | Admitting: Family Medicine

## 2023-08-17 ENCOUNTER — Encounter: Payer: Self-pay | Admitting: Emergency Medicine

## 2023-08-17 DIAGNOSIS — G43819 Other migraine, intractable, without status migrainosus: Secondary | ICD-10-CM | POA: Diagnosis not present

## 2023-08-17 DIAGNOSIS — F4312 Post-traumatic stress disorder, chronic: Secondary | ICD-10-CM | POA: Diagnosis not present

## 2023-08-17 MED ORDER — PROMETHAZINE HCL 25 MG PO TABS: 25.0000 mg | ORAL_TABLET | Freq: Three times a day (TID) | ORAL | 0 refills | Status: DC | PRN

## 2023-08-17 MED ORDER — KETOROLAC TROMETHAMINE 30 MG/ML IJ SOLN
30.0000 mg | Freq: Once | INTRAMUSCULAR | Status: AC
  Administered 2023-08-17: 30 mg via INTRAMUSCULAR

## 2023-08-17 NOTE — ED Provider Notes (Signed)
[facility]    CSN: [id number] Arrival date & time: 08/17/23  1856      History   Chief Complaint Chief Complaint  Patient presents with   Headache    HPI Mary Parker is a 42 y.o. female.   Patient presenting today with a significant migraine type headache for the past 3 days now.  She states she has some light sensitivity, sound sensitivity and nausea associated.  Denies head injury, vomiting, mental status changes, dizziness, fevers.  Trying her migraine medications with no relief.  She states she takes [place] and has Zofran for this.    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/2021   11/09/21 needs colposcopy   IBS (irritable bowel syndrome)    Knee pain, right    Migraines    Rebound headache 12/19/2014   Right ovarian cyst 04/25/2017   Complex, check CA 125 and repeat US in 6 weeks    Sciatica of left side    Seizures (HCC)    remote past, felt it was related to Chantix    Sleep apnea    Stroke [facility])    migraine induced stroke   Vitamin D deficiency    Wears glasses     Patient Active Problem List   Diagnosis Date Noted   Weight loss of more than 10% body weight 06/27/2023   Mood disorder (HCC) 01/05/2023   Hemorrhoids, thrombosed 04/09/2022   Prolapsed internal hemorrhoids, grade 2 11/25/2021   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/09/2021   Rectal pain 12/22/2020   Nausea without vomiting 12/22/2020   Early satiety 12/22/2020   Loss of weight 12/22/2020    RUQ abdominal pain 10/15/2020   OSA (obstructive sleep apnea) 09/08/2020   Sleep walking and eating 07/30/2020   Excessive daytime sleepiness 07/30/2020   Class 3 severe obesity due to excess calories without serious comorbidity with body mass index (BMI) of 40.0 to 44.9 in adult (HCC) 07/30/2020   Psychophysiological insomnia 07/30/2020   Loud snoring 07/30/2020   Chronic migraine without aura, with intractable migraine, so stated, with status migrainosus 07/12/2019   Cigarette smoker 05/04/2019   Obesity, Class II, BMI 35-39.9 05/04/2019   Stroke-like episode (HCC) s/p tPA 05/03/2019   Acute gastroenteritis 03/29/2019   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   S/P abdominal supracervical subtotal hysterectomy 06/21/2017   Pelvic adhesions 06/09/2017   History of ovarian cyst 06/09/2017   Right ovarian cyst 04/25/2017   Rectal bleeding 04/02/2017   LLQ pain 04/01/2017   Constipation 04/01/2017   Concussion with loss of consciousness 11/16/2016   Anxiety 10/14/2016   Chronic low back pain 05/29/2015   Complicated migraine 12/19/2014   Fibromyalgia 12/19/2014   Rebound headache 12/19/2014   Depression 12/11/2014   Body aches 12/11/2014    Past Surgical History:  Procedure Laterality Date   BILATERAL SALPINGECTOMY Bilateral 06/21/2017   Procedure: BILATERAL SALPINGECTOMY;  Surgeon: Tilda Burrow, MD;  Location: [facility];  Service: Gynecology;  Laterality: Bilateral;   CESAREAN SECTION  [phone]   CHOLECYSTECTOMY  2001   COLONOSCOPY WITH PROPOFOL N/A 04/07/2017   Dr. Lovena Neighbours: Normal, abnormal CT likely artifactual   ENDOMETRIAL ABLATION     ESOPHAGOGASTRODUODENOSCOPY  2002   [facility]: normal   KNEE ARTHROSCOPY WITH LATERAL RELEASE Right 04/18/2018   Procedure: Right knee arthroscopy with chondroplasty and lateral release;  Surgeon: Yolonda Kida, MD;  Location: [facility];  Service: Orthopedics;  Laterality: Right;  60 mins   LASER ABLATION  CONDOLAMATA N/A 12/30/2021   Procedure: LASER ABLATION of the cervix;  Surgeon: Lazaro Arms, MD;  Location: [facility];  Service: Gynecology;  Laterality: N/A;   MOUTH BIOPSY     MOUTH SURGERY     MULTIPLE TOOTH EXTRACTIONS     03/24/2023   SUPRACERVICAL ABDOMINAL HYSTERECTOMY N/A 06/21/2017   Procedure: HYSTERECTOMY SUPRACERVICAL ABDOMINAL;  Surgeon: Tilda Burrow, MD;  Location: [facility];  Service: Gynecology;  Laterality: N/A;   TUBAL LIGATION      OB History     Gravida  3   Para  3   Term  3   Preterm      AB      Living  3      SAB      IAB      Ectopic      Multiple      Live Births  3            Home Medications    Prior to Admission medications   Medication Sig Start Date End Date Taking? Authorizing Provider  promethazine (PHENERGAN) 25 MG tablet Take 1 tablet (25 mg total) by mouth every 8 (eight) hours as needed for nausea or vomiting. May cause drowsiness 08/17/23  Yes Particia Nearing, PA-C  ALPRAZolam Prudy Feeler) 1 MG tablet Take 1 mg by mouth 2 (two) times daily. 09/21/21   [provider]  amoxicillin (AMOXIL) 500 MG capsule Take 500 mg by mouth 3 (three) times daily. 03/24/23   [provider]  budesonide-formoterol (SYMBICORT) 160-4.5 MCG/ACT inhaler Inhale 2 puffs into the lungs 2 (two) times daily as needed (for flares).    [provider]  cyanocobalamin (,VITAMIN B-12,) 1000 MCG/ML injection Inject 1,000 mcg into the skin every 30 (thirty) days.  02/17/18   [provider]  dicyclomine (BENTYL) 10 MG capsule TAKE 1 CAPSULE BY MOUTH UP TO THREE TIMES DAILY AS NEEDED FOR SPASMS OR ABDOMINAL PAIN Patient taking differently: as needed. TAKE 1 CAPSULE BY MOUTH UP TO THREE TIMES DAILY AS NEEDED FOR SPASMS OR ABDOMINAL PAIN 08/13/22   Letta Median, PA-C  methocarbamol (ROBAXIN) 750 MG tablet Take 750 mg by mouth at bedtime.    [provider]  omeprazole (PRILOSEC) 20 MG capsule Take 20 mg by mouth  daily. 12/18/21   [provider]  ondansetron (ZOFRAN-ODT) 4 MG disintegrating tablet DISSOLVE 1 TABLET(4 MG) ON THE TONGUE EVERY 8 HOURS AS NEEDED FOR NAUSEA OR VOMITING 07/21/21   York Spaniel, MD  SERTRALINE HCL PO Take 75 mg by mouth daily. 07/10/22   [provider]  tiZANidine (ZANAFLEX) 2 MG tablet Take 1 tablet (2 mg total) by mouth every 6 (six) hours as needed for muscle spasms (as needed for migraine cocktial). Patient not taking: Reported on 05/18/2023 01/05/23   Glean Salvo, NP  Ubrogepant (UBRELVY) 100 MG TABS Take 1 tablet (100 mg total)  by mouth as needed (take 1 tablet at onset of headache, may repeat 2 hours after initial dose, max is 200 mg/24 hours). 01/05/23   Glean Salvo, NP  Vitamin D, Ergocalciferol, (DRISDOL) 50000 units CAPS capsule Take 50,000 Units by mouth every Sunday. 02/18/18   [provider]    Family History Family History  Problem Relation Age of Onset   Cancer Maternal Grandmother    Osteoporosis Maternal Grandmother    Cancer Maternal Grandfather    Diabetes Father    Hypertension Father    Alcohol abuse Father    Breast cancer Mother    Hypertension Mother    Other Mother        abnormal cells; had hyst   Obesity Daughter    Diabetes Daughter 49       youngest   Obesity Daughter    Obesity Son    Colon cancer Neg Hx     Social History Social History   Tobacco Use   Smoking status: Former    Current packs/day: 0.00    Average packs/day: 0.3 packs/day for 25.0 years (6.3 ttl pk-yrs)    Types: Cigarettes    Start date: 04/01/1994    Quit date: 04/02/2019    Years since quitting: 4.3   Smokeless tobacco: Never  Vaping Use   Vaping status: Never Used  Substance Use Topics   Alcohol use: No   Drug use: Yes    Types: Marijuana     Allergies   Bupropion, Duloxetine hcl, Gabapentin, Meloxicam, Pregabalin, Amitriptyline, Ciprofloxacin, Desvenlafaxine succinate er, Hydrocodone-acetaminophen, Milnacipran,  Nortriptyline, Topiramate, Decadron [dexamethasone], Triptans, Doxycycline, Percocet [oxycodone-acetaminophen], and Sumatriptan   Review of Systems Review of Systems Per HPI  Physical Exam Triage Vital Signs ED Triage Vitals  Encounter Vitals Group     BP 08/17/23 1902 116/77     Systolic BP Percentile --      Diastolic BP Percentile --      Pulse Rate 08/17/23 1902 77     Resp 08/17/23 1902 18     Temp 08/17/23 1902 (!) 97.5 F (36.4 C)     Temp Source 08/17/23 1902 Oral     SpO2 08/17/23 1902 97 %     Weight --      Height --      Head Circumference --      Peak Flow --      Pain Score 08/17/23 1911 10     Pain Loc --      Pain Education --      Exclude from Growth Chart --    No data found.  Updated Vital Signs BP 116/77 (BP Location: Right Arm)   Pulse 77   Temp (!) 97.5 F (36.4 C) (Oral)   Resp 18   SpO2 97%   Visual Acuity Right Eye Distance:   Left Eye Distance:   Bilateral Distance:    Right Eye Near:   Left Eye Near:    Bilateral Near:     Physical Exam Vitals and nursing note reviewed.  Constitutional:      Appearance: Normal appearance. She is not ill-appearing.  HENT:     Head: Atraumatic.     Mouth/Throat:     Mouth: Mucous membranes are moist.     Pharynx: Oropharynx is clear.  Eyes:     Extraocular Movements: Extraocular movements intact.     Conjunctiva/sclera: Conjunctivae normal.     Pupils: Pupils are equal, round, and reactive to light.  Cardiovascular:  Rate and Rhythm: Normal rate and regular rhythm.     Heart sounds: Normal heart sounds.  Pulmonary:     Effort: Pulmonary effort is normal.     Breath sounds: Normal breath sounds.  Abdominal:     General: Bowel sounds are normal. There is no distension.     Palpations: Abdomen is soft.     Tenderness: There is no abdominal tenderness. There is no guarding.  Musculoskeletal:        General: Normal range of motion.     Cervical back: Normal range of motion and neck  supple.  Skin:    General: Skin is warm and dry.  Neurological:     General: No focal deficit present.     Mental Status: She is alert and oriented to person, place, and time.     Cranial Nerves: No cranial nerve deficit.     Motor: No weakness.     Gait: Gait normal.  Psychiatric:        Mood and Affect: Mood normal.        Thought Content: Thought content normal.        Judgment: Judgment normal.      UC Treatments / Results  Labs (all labs ordered are listed, but only abnormal results are displayed) Labs Reviewed - No data to display  EKG   Radiology No results found.  Procedures Procedures (including critical care time)  Medications Ordered in UC Medications  ketorolac (TORADOL) 30 MG/ML injection 30 mg (30 mg Intramuscular Given 08/17/23 1938)    Initial Impression / Assessment and Plan / UC Course  I have reviewed the triage vital signs and the nursing notes.  Pertinent labs & imaging results that were available during my care of the patient were reviewed by me and considered in my medical decision making (see chart for details).     Treat with IM Toradol in addition to her Bernita Raisin, and add Phenergan as the Zofran is not helping with her nausea.  Discussed supportive home care and return precautions.  No red flag findings today.  Final Clinical Impressions(s) / UC Diagnoses   Final diagnoses:  Other migraine without status migrainosus, intractable   Discharge Instructions   None    ED Prescriptions     Medication Sig Dispense Auth. Provider   promethazine (PHENERGAN) 25 MG tablet Take 1 tablet (25 mg total) by mouth every 8 (eight) hours as needed for nausea or vomiting. May cause drowsiness 21 tablet Particia Nearing, [place]      PDMP not reviewed this encounter.   Particia Nearing, [place] 08/20/23 1418

## 2023-08-17 NOTE — ED Triage Notes (Signed)
Pt reports migraine since Monday. Pt reports has taken px medication and reports is not helping symptoms. Reports pain, light and sound sensitivity, and nausea persist.

## 2023-08-18 DIAGNOSIS — G518 Other disorders of facial nerve: Secondary | ICD-10-CM | POA: Diagnosis not present

## 2023-08-18 DIAGNOSIS — M791 Myalgia, unspecified site: Secondary | ICD-10-CM | POA: Diagnosis not present

## 2023-08-18 DIAGNOSIS — G43719 Chronic migraine without aura, intractable, without status migrainosus: Secondary | ICD-10-CM | POA: Diagnosis not present

## 2023-08-18 DIAGNOSIS — M542 Cervicalgia: Secondary | ICD-10-CM | POA: Diagnosis not present

## 2023-08-19 DIAGNOSIS — F4312 Post-traumatic stress disorder, chronic: Secondary | ICD-10-CM | POA: Diagnosis not present

## 2023-08-26 DIAGNOSIS — F4312 Post-traumatic stress disorder, chronic: Secondary | ICD-10-CM | POA: Diagnosis not present

## 2023-08-30 DIAGNOSIS — G43719 Chronic migraine without aura, intractable, without status migrainosus: Secondary | ICD-10-CM | POA: Diagnosis not present

## 2023-08-30 DIAGNOSIS — M791 Myalgia, unspecified site: Secondary | ICD-10-CM | POA: Diagnosis not present

## 2023-08-30 DIAGNOSIS — G518 Other disorders of facial nerve: Secondary | ICD-10-CM | POA: Diagnosis not present

## 2023-08-30 DIAGNOSIS — M542 Cervicalgia: Secondary | ICD-10-CM | POA: Diagnosis not present

## 2023-09-02 DIAGNOSIS — F4312 Post-traumatic stress disorder, chronic: Secondary | ICD-10-CM | POA: Diagnosis not present

## 2023-09-09 DIAGNOSIS — F4312 Post-traumatic stress disorder, chronic: Secondary | ICD-10-CM | POA: Diagnosis not present

## 2023-09-14 DIAGNOSIS — B3731 Acute candidiasis of vulva and vagina: Secondary | ICD-10-CM | POA: Diagnosis not present

## 2023-09-14 DIAGNOSIS — G43909 Migraine, unspecified, not intractable, without status migrainosus: Secondary | ICD-10-CM | POA: Diagnosis not present

## 2023-09-14 DIAGNOSIS — F329 Major depressive disorder, single episode, unspecified: Secondary | ICD-10-CM | POA: Diagnosis not present

## 2023-09-14 DIAGNOSIS — R6889 Other general symptoms and signs: Secondary | ICD-10-CM | POA: Diagnosis not present

## 2023-09-14 DIAGNOSIS — H547 Unspecified visual loss: Secondary | ICD-10-CM | POA: Diagnosis not present

## 2023-09-15 DIAGNOSIS — M791 Myalgia, unspecified site: Secondary | ICD-10-CM | POA: Diagnosis not present

## 2023-09-15 DIAGNOSIS — M542 Cervicalgia: Secondary | ICD-10-CM | POA: Diagnosis not present

## 2023-09-15 DIAGNOSIS — G43719 Chronic migraine without aura, intractable, without status migrainosus: Secondary | ICD-10-CM | POA: Diagnosis not present

## 2023-09-15 DIAGNOSIS — G518 Other disorders of facial nerve: Secondary | ICD-10-CM | POA: Diagnosis not present

## 2023-09-16 DIAGNOSIS — F4312 Post-traumatic stress disorder, chronic: Secondary | ICD-10-CM | POA: Diagnosis not present

## 2023-10-03 DIAGNOSIS — F4312 Post-traumatic stress disorder, chronic: Secondary | ICD-10-CM | POA: Diagnosis not present

## 2023-10-07 DIAGNOSIS — F4312 Post-traumatic stress disorder, chronic: Secondary | ICD-10-CM | POA: Diagnosis not present

## 2023-10-14 DIAGNOSIS — F4312 Post-traumatic stress disorder, chronic: Secondary | ICD-10-CM | POA: Diagnosis not present

## 2023-10-18 DIAGNOSIS — M542 Cervicalgia: Secondary | ICD-10-CM | POA: Diagnosis not present

## 2023-10-18 DIAGNOSIS — M791 Myalgia, unspecified site: Secondary | ICD-10-CM | POA: Diagnosis not present

## 2023-10-18 DIAGNOSIS — G518 Other disorders of facial nerve: Secondary | ICD-10-CM | POA: Diagnosis not present

## 2023-10-18 DIAGNOSIS — G43719 Chronic migraine without aura, intractable, without status migrainosus: Secondary | ICD-10-CM | POA: Diagnosis not present

## 2023-10-21 DIAGNOSIS — F4312 Post-traumatic stress disorder, chronic: Secondary | ICD-10-CM | POA: Diagnosis not present

## 2023-11-04 DIAGNOSIS — F4312 Post-traumatic stress disorder, chronic: Secondary | ICD-10-CM | POA: Diagnosis not present

## 2023-11-08 DIAGNOSIS — F4312 Post-traumatic stress disorder, chronic: Secondary | ICD-10-CM | POA: Diagnosis not present

## 2023-11-11 DIAGNOSIS — F4312 Post-traumatic stress disorder, chronic: Secondary | ICD-10-CM | POA: Diagnosis not present

## 2023-11-15 DIAGNOSIS — G518 Other disorders of facial nerve: Secondary | ICD-10-CM | POA: Diagnosis not present

## 2023-11-15 DIAGNOSIS — M542 Cervicalgia: Secondary | ICD-10-CM | POA: Diagnosis not present

## 2023-11-15 DIAGNOSIS — G43719 Chronic migraine without aura, intractable, without status migrainosus: Secondary | ICD-10-CM | POA: Diagnosis not present

## 2023-11-15 DIAGNOSIS — M791 Myalgia, unspecified site: Secondary | ICD-10-CM | POA: Diagnosis not present

## 2023-11-17 ENCOUNTER — Ambulatory Visit: Payer: BC Managed Care – PPO | Admitting: Adult Health

## 2023-11-17 ENCOUNTER — Encounter: Payer: Self-pay | Admitting: Adult Health

## 2023-11-17 VITALS — BP 116/76 | HR 76 | Ht 64.0 in | Wt 183.0 lb

## 2023-11-17 DIAGNOSIS — N6342 Unspecified lump in left breast, subareolar: Secondary | ICD-10-CM | POA: Diagnosis not present

## 2023-11-17 DIAGNOSIS — N644 Mastodynia: Secondary | ICD-10-CM

## 2023-11-17 DIAGNOSIS — Z9889 Other specified postprocedural states: Secondary | ICD-10-CM | POA: Diagnosis not present

## 2023-11-17 DIAGNOSIS — Z803 Family history of malignant neoplasm of breast: Secondary | ICD-10-CM | POA: Diagnosis not present

## 2023-11-17 MED ORDER — FLUCONAZOLE 150 MG PO TABS: ORAL_TABLET | ORAL | 1 refills | Status: DC

## 2023-11-17 MED ORDER — AMOXICILLIN-POT CLAVULANATE 875-125 MG PO TABS: 1.0000 | ORAL_TABLET | Freq: Two times a day (BID) | ORAL | 0 refills | Status: DC

## 2023-11-17 NOTE — Progress Notes (Signed)
Subjective:     Patient ID: Mary Parker, female   DOB: 1981/10/28, 43 y.o.   MRN: [id number]  HPI [name] is a 43 year old white female, divorced, sp [facility], in complaining of left breast lump and pinching sensation on left nipple for a few days.     Component Value Date/Time   DIAGPAP - Low grade squamous intraepithelial lesion (LSIL) (A) 07/27/2022 0944   DIAGPAP (A) 11/04/2021 1022    - High grade squamous intraepithelial lesion (HSIL)   DIAGPAP - See comment (A) 11/04/2021 1022   HPVHIGH Negative 11/04/2021 1022   ADEQPAP  07/27/2022 0944    Satisfactory for evaluation; transformation zone component PRESENT.   ADEQPAP  11/04/2021 1022    Satisfactory for evaluation; transformation zone component ABSENT.   ADEQPAP  04/19/2017 0000    Satisfactory for evaluation  endocervical/transformation zone component PRESENT.   PCP is Dr Margo Aye.  Review of Systems Has lump in left breast and pinching sensation in nipple for last few days Reviewed past medical,surgical, social and family history. Reviewed medications and allergies.     Objective:   Physical Exam BP 116/76 (BP Location: Left Arm, Patient Position: Sitting, Cuff Size: Normal)   Pulse 76   Ht 5\' 4"  (1.626 m)   Wt 183 lb (83 kg)   BMI 31.41 kg/m      Skin warm and dry,  Breasts:no dominate palpable mass, retraction or nipple discharge on the right, on the left, no retraction or nipple discharge, has 1 cm mass at 6 o'clock in areola, the areola has some mild redness and skin texture changes, and it is tender as is nipple. Fall risk is low  Upstream - 11/17/23 1202       Pregnancy Intention Screening   Does the patient want to become pregnant in the next year? N/A    Does the patient's partner want to become pregnant in the next year? N/A    Would the patient like to discuss contraceptive options today? N/A      Contraception Wrap Up   Current Method Female Sterilization   [facility]   End Method Female Sterilization   [facility]    Contraception Counseling Provided No             Assessment:     1. Subareolar mass of left breast (Primary) Has 1 cm mass at 6 0' clock in areola left breast Left diagnostic mammogram and [place] scheduled for 01/03/24 at [facility] at 10:20 am they will call if gets another day - MM 3D DIAGNOSTIC MAMMOGRAM UNILATERAL LEFT BREAST; Future - [place] LIMITED ULTRASOUND INCLUDING AXILLA LEFT BREAST ; Future  2. Breast pain, left Has pinching sensation left nipple with tender mass, and skin changes Will rx Augmentin 875-125 mg 1 daily for 10 days, can use ice on and off Take tylenol and advil if needed  Scheduled diagnostic mammogram and [place] for 01/03/24 at [facility]  Meds ordered this encounter  Medications   amoxicillin-clavulanate (AUGMENTIN) 875-125 MG tablet    Sig: Take 1 tablet by mouth 2 (two) times daily.    Dispense:  20 tablet    Refill:  0    Supervising Provider:   Duane Lope H [[id number]]   fluconazole (DIFLUCAN) 150 MG tablet    Sig: Take 1 now and 1 in 3 days if needed    Dispense:  2 tablet    Refill:  1    Supervising Provider:   Duane Lope H [[id number]]    -  MM 3D DIAGNOSTIC MAMMOGRAM UNILATERAL LEFT BREAST; Future - [place] LIMITED ULTRASOUND INCLUDING AXILLA LEFT BREAST ; Future Will get Dr Despina Hidden to recheck in 11 days when he sees her   3. S/P laser ablation of the cervix for HSIL Has laser ablation 12/30/21 Needs follow pap with Dr Despina Hidden, missed follow up    4. Family history of breast cancer in her mom  Plan:     Return 11/28/23 to see Dr Despina Hidden for pap and recheck breast

## 2023-11-18 ENCOUNTER — Other Ambulatory Visit: Payer: Self-pay | Admitting: Adult Health

## 2023-11-18 DIAGNOSIS — F4312 Post-traumatic stress disorder, chronic: Secondary | ICD-10-CM | POA: Diagnosis not present

## 2023-11-18 MED ORDER — KETOROLAC TROMETHAMINE 10 MG PO TABS: 10.0000 mg | ORAL_TABLET | Freq: Four times a day (QID) | ORAL | 0 refills | Status: DC | PRN

## 2023-11-18 NOTE — Progress Notes (Signed)
Will rx Toradol for pain in breast

## 2023-11-22 ENCOUNTER — Ambulatory Visit: Payer: BC Managed Care – PPO | Admitting: Adult Health

## 2023-11-25 DIAGNOSIS — F4312 Post-traumatic stress disorder, chronic: Secondary | ICD-10-CM | POA: Diagnosis not present

## 2023-11-28 ENCOUNTER — Other Ambulatory Visit (HOSPITAL_COMMUNITY)
Admission: RE | Admit: 2023-11-28 | Discharge: 2023-11-28 | Disposition: A | Discharge status: Home / Self Care | Payer: BC Managed Care – PPO | Source: Ambulatory Visit | Attending: Obstetrics & Gynecology | Admitting: Obstetrics & Gynecology

## 2023-11-28 ENCOUNTER — Ambulatory Visit: Payer: BC Managed Care – PPO | Admitting: Obstetrics & Gynecology

## 2023-11-28 ENCOUNTER — Encounter: Payer: Self-pay | Admitting: Obstetrics & Gynecology

## 2023-11-28 VITALS — BP 105/72 | HR 80 | Ht 62.0 in | Wt 185.0 lb

## 2023-11-28 DIAGNOSIS — Z87410 Personal history of cervical dysplasia: Secondary | ICD-10-CM

## 2023-11-28 DIAGNOSIS — Z1151 Encounter for screening for human papillomavirus (HPV): Secondary | ICD-10-CM | POA: Diagnosis not present

## 2023-11-28 DIAGNOSIS — Z01419 Encounter for gynecological examination (general) (routine) without abnormal findings: Secondary | ICD-10-CM

## 2023-11-28 DIAGNOSIS — N6342 Unspecified lump in left breast, subareolar: Secondary | ICD-10-CM

## 2023-11-28 NOTE — Progress Notes (Signed)
Subjective:     Mary Parker is a 43 y.o. female here for a routine exam.  No LMP recorded. Patient has had a hysterectomy. [id number] Birth Control Method:  hysterectomy, supra cervical Menstrual Calendar(currently): amenorrhea  Current complaints: none.   Current acute medical issues:  none   Recent Gynecologic History No LMP recorded. Patient has had a hysterectomy. Last Pap: 9/23,  normal Last mammogram: 8/24,  normal  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/2021   11/09/21 needs colposcopy   IBS (irritable bowel syndrome)    Knee pain, right    Migraines    Rebound headache 12/19/2014   Right ovarian cyst 04/25/2017   Complex, check CA 125 and repeat US in 6 weeks    Sciatica of left side    Seizures (HCC)    remote past, felt it was related to Chantix    Sleep apnea    Stroke [facility])    migraine induced stroke   Vitamin D deficiency    Wears glasses     Past Surgical History:  Procedure Laterality Date   BILATERAL SALPINGECTOMY Bilateral 06/21/2017   Procedure: BILATERAL SALPINGECTOMY;  Surgeon: Tilda Burrow, MD;  Location: [facility];  Service: Gynecology;  Laterality: Bilateral;   CESAREAN SECTION  [phone]   CHOLECYSTECTOMY  2001   COLONOSCOPY WITH PROPOFOL N/A 04/07/2017   Dr. Lovena Neighbours: Normal, abnormal CT likely artifactual   ENDOMETRIAL ABLATION     ESOPHAGOGASTRODUODENOSCOPY  2002   [facility]: normal   KNEE ARTHROSCOPY WITH LATERAL RELEASE Right 04/18/2018   Procedure: Right knee arthroscopy with  chondroplasty and lateral release;  Surgeon: Yolonda Kida, MD;  Location: [facility];  Service: Orthopedics;  Laterality: Right;  60 mins   LASER ABLATION CONDOLAMATA N/A 12/30/2021   Procedure: LASER ABLATION of the cervix;  Surgeon: Lazaro Arms, MD;  Location: [facility];  Service: Gynecology;  Laterality: N/A;   MOUTH BIOPSY     MOUTH SURGERY     MULTIPLE TOOTH EXTRACTIONS     03/24/2023   SUPRACERVICAL ABDOMINAL HYSTERECTOMY N/A 06/21/2017   Procedure: HYSTERECTOMY SUPRACERVICAL ABDOMINAL;  Surgeon: Tilda Burrow, MD;  Location: [facility];  Service: Gynecology;  Laterality: N/A;   TUBAL LIGATION      OB History     Gravida  3   Para  3   Term  3   Preterm      AB      Living  3      SAB      IAB      Ectopic      Multiple      Live Births  3           Social History   Socioeconomic History   Marital status: Divorced    Spouse name: Not on file   Number of children: 3   Years of education: HS   Highest education level: Not on file  Occupational  History   Occupation: [employer]  Tobacco Use   Smoking status: Former    Current packs/day: 0.00    Average packs/day: 0.3 packs/day for 25.0 years (6.3 ttl pk-yrs)    Types: Cigarettes    Start date: 04/01/1994    Quit date: 04/02/2019    Years since quitting: 4.6   Smokeless tobacco: Never  Vaping Use   Vaping status: Never Used  Substance and Sexual Activity   Alcohol use: No   Drug use: Yes    Types: Marijuana   Sexual activity: Not Currently    Birth control/protection: Surgical    Comment: supracervical hyst and tubal  Other Topics Concern   Not on file  Social History Narrative   Patient is right handed.   Patient drinks 1-2 cups of caffeine dailyy.   Patient lives mom stepdad, and children.   Social Drivers of Corporate investment banker Strain: Low Risk  (11/04/2021)   Overall Financial Resource Strain (CARDIA)    Difficulty of Paying Living Expenses: Not very  hard  Food Insecurity: Food Insecurity Present (11/04/2021)   Hunger Vital Sign    Worried About Running Out of Food in the Last Year: Sometimes true    Ran Out of Food in the Last Year: Sometimes true  Transportation Needs: No Transportation Needs (11/04/2021)   PRAPARE - Administrator, Civil Service (Medical): No    Lack of Transportation (Non-Medical): No  Physical Activity: Inactive (11/04/2021)   Exercise Vital Sign    Days of Exercise per Week: 0 days    Minutes of Exercise per Session: 0 min  Stress: Stress Concern Present (11/04/2021)   [employer] of Occupational Health - Occupational Stress Questionnaire    Feeling of Stress : Very much  Social Connections: Unknown (02/28/2022)   [facility]   Social Network    Social Network: Not on file    Family History  Problem Relation Age of Onset   Cancer Maternal Grandmother    Osteoporosis Maternal Grandmother    Cancer Maternal Grandfather    Diabetes Father    Hypertension Father    Alcohol abuse Father    Breast cancer Mother    Hypertension Mother    Other Mother        abnormal cells; had hyst   Obesity Daughter    Diabetes Daughter 41       youngest   Obesity Daughter    Obesity Son    Colon cancer Neg Hx      Current Outpatient Medications:    ALPRAZolam (XANAX) 1 MG tablet, Take 1 mg by mouth 2 (two) times daily., Disp: , Rfl:    amoxicillin-clavulanate (AUGMENTIN) 875-125 MG tablet, Take 1 tablet by mouth 2 (two) times daily., Disp: 20 tablet, Rfl: 0   budesonide-formoterol (SYMBICORT) 160-4.5 MCG/ACT inhaler, Inhale 2 puffs into the lungs 2 (two) times daily as needed (for flares)., Disp: , Rfl:    cyanocobalamin (,VITAMIN B-12,) 1000 MCG/ML injection, Inject 1,000 mcg into the skin every 30 (thirty) days. , Disp: , Rfl: 0   dicyclomine (BENTYL) 10 MG capsule, TAKE 1 CAPSULE BY MOUTH UP TO THREE TIMES DAILY AS NEEDED FOR SPASMS OR ABDOMINAL PAIN (Patient taking  differently: as needed. TAKE 1 CAPSULE BY MOUTH UP TO THREE TIMES DAILY AS NEEDED FOR SPASMS OR ABDOMINAL PAIN), Disp: 120 capsule, Rfl: 1   diphenhydrAMINE HCl (BENADRYL PO), Take by mouth., Disp: , Rfl:  fluconazole (DIFLUCAN) 150 MG tablet, Take 1 now and 1 in 3 days if needed, Disp: 2 tablet, Rfl: 1   ketorolac (TORADOL) 10 MG tablet, Take 1 tablet (10 mg total) by mouth every 6 (six) hours as needed., Disp: 20 tablet, Rfl: 0   methocarbamol (ROBAXIN) 750 MG tablet, Take 750 mg by mouth at bedtime., Disp: , Rfl:    omeprazole (PRILOSEC) 20 MG capsule, Take 20 mg by mouth daily., Disp: , Rfl:    ondansetron (ZOFRAN-ODT) 4 MG disintegrating tablet, DISSOLVE 1 TABLET(4 MG) ON THE TONGUE EVERY 8 HOURS AS NEEDED FOR NAUSEA OR VOMITING, Disp: 30 tablet, Rfl: 6   promethazine (PHENERGAN) 25 MG tablet, Take 1 tablet (25 mg total) by mouth every 8 (eight) hours as needed for nausea or vomiting. May cause drowsiness, Disp: 21 tablet, Rfl: 0   SERTRALINE HCL PO, Take 50 mg by mouth daily., Disp: , Rfl:    Ubrogepant (UBRELVY) 100 MG TABS, Take 1 tablet (100 mg total) by mouth as needed (take 1 tablet at onset of headache, may repeat 2 hours after initial dose, max is 200 mg/24 hours)., Disp: 12 tablet, Rfl: 11   VITAMIN D PO, Take by mouth., Disp: , Rfl:    zonisamide (ZONEGRAN) 100 MG capsule, Take 100 mg by mouth daily., Disp: , Rfl:   Review of Systems  Review of Systems  Constitutional: Negative for fever, chills, weight loss, malaise/fatigue and diaphoresis.  HENT: Negative for hearing loss, ear pain, nosebleeds, congestion, sore throat, neck pain, tinnitus and ear discharge.   Eyes: Negative for blurred vision, double vision, photophobia, pain, discharge and redness.  Respiratory: Negative for cough, hemoptysis, sputum production, shortness of breath, wheezing and stridor.   Cardiovascular: Negative for chest pain, palpitations, orthopnea, claudication, leg swelling and PND.  Gastrointestinal:  negative for abdominal pain. Negative for heartburn, nausea, vomiting, diarrhea, constipation, blood in stool and melena.  Genitourinary: Negative for dysuria, urgency, frequency, hematuria and flank pain.  Musculoskeletal: Negative for myalgias, back pain, joint pain and falls.  Skin: Negative for itching and rash.  Neurological: Negative for dizziness, tingling, tremors, sensory change, speech change, focal weakness, seizures, loss of consciousness, weakness and headaches.  Endo/Heme/Allergies: Negative for environmental allergies and polydipsia. Does not bruise/bleed easily.  Psychiatric/Behavioral: Negative for depression, suicidal ideas, hallucinations, memory loss and substance abuse. The patient is not nervous/anxious and does not have insomnia.        Objective:  Blood pressure 105/72, pulse 80, height 5\' 2"  (1.575 m), weight 185 lb (83.9 kg).   Physical Exam  Vitals reviewed. Constitutional: She is oriented to person, place, and time. She appears well-developed and well-nourished.  HENT:  Head: Normocephalic and atraumatic.        Right Ear: External ear normal.  Left Ear: External ear normal.  Nose: Nose normal.  Mouth/Throat: Oropharynx is clear and moist.  Eyes: Conjunctivae and EOM are normal. Pupils are equal, round, and reactive to light. Right eye exhibits no discharge. Left eye exhibits no discharge. No scleral icterus.  Neck: Normal range of motion. Neck supple. No tracheal deviation present. No thyromegaly present.  Cardiovascular: Normal rate, regular rhythm, normal heart sounds and intact distal pulses.  Exam reveals no gallop and no friction rub.   No murmur heard. Respiratory: Effort normal and breath sounds normal. No respiratory distress. She has no wheezes. She has no rales. She exhibits no tenderness.  GI: Soft. Bowel sounds are normal. She exhibits no distension and no mass. There is no  tenderness. There is no rebound and no guarding.  Genitourinary:  Breasts  no masses skin changes or nipple changes bilaterally      Vulva is normal without lesions Vagina is pink moist without discharge Cervix normal in appearance and pap is done Uterus is normal size shape and contour Adnexa is negative with normal sized ovaries   Musculoskeletal: Normal range of motion. She exhibits no edema and no tenderness.  Neurological: She is alert and oriented to person, place, and time. She has normal reflexes. She displays normal reflexes. No cranial nerve deficit. She exhibits normal muscle tone. Coordination normal.  Skin: Skin is warm and dry. No rash noted. No erythema. No pallor.  Psychiatric: She has a normal mood and affect. Her behavior is normal. Judgment and thought content normal.       Medications Ordered at today's visit: No orders of the defined types were placed in this encounter.   Other orders placed at today's visit: No orders of the defined types were placed in this encounter.    ASSESSMENT + PLAN:    ICD-10-CM   1. Well woman exam with routine gynecological exam  Z01.419     2. History of high grade cervical dysplasia:12/30/21  Z87.410     3. Subareolar mass of left breast: mastits it appears, treated with augmentin and resolved  N63.42           No follow-ups on file.

## 2023-12-01 DIAGNOSIS — F4312 Post-traumatic stress disorder, chronic: Secondary | ICD-10-CM | POA: Diagnosis not present

## 2023-12-01 LAB — CYTOLOGY - PAP

## 2023-12-05 ENCOUNTER — Encounter: Payer: Self-pay | Admitting: Obstetrics & Gynecology

## 2023-12-09 DIAGNOSIS — F4312 Post-traumatic stress disorder, chronic: Secondary | ICD-10-CM | POA: Diagnosis not present

## 2023-12-12 DIAGNOSIS — M542 Cervicalgia: Secondary | ICD-10-CM | POA: Diagnosis not present

## 2023-12-12 DIAGNOSIS — G43719 Chronic migraine without aura, intractable, without status migrainosus: Secondary | ICD-10-CM | POA: Diagnosis not present

## 2023-12-12 DIAGNOSIS — G518 Other disorders of facial nerve: Secondary | ICD-10-CM | POA: Diagnosis not present

## 2023-12-12 DIAGNOSIS — M791 Myalgia, unspecified site: Secondary | ICD-10-CM | POA: Diagnosis not present

## 2023-12-15 ENCOUNTER — Telehealth: Payer: BC Managed Care – PPO | Admitting: Physician Assistant

## 2023-12-15 DIAGNOSIS — A084 Viral intestinal infection, unspecified: Secondary | ICD-10-CM

## 2023-12-15 MED ORDER — ONDANSETRON 4 MG PO TBDP: 4.0000 mg | ORAL_TABLET | Freq: Three times a day (TID) | ORAL | 0 refills | Status: AC | PRN

## 2023-12-15 NOTE — Patient Instructions (Signed)
[name], thank you for joining Piedad Climes, PA-C for today's virtual visit.  While this provider is not your primary care provider (PCP), if your PCP is located in our provider database this encounter information will be shared with them immediately following your visit.   A [facility] MyChart account gives you access to today's visit and all your visits, tests, and labs performed at [facility] " click here if you don't have a [facility] MyChart account or go to [email or web address]  Consent: (Patient) Mary Parker provided verbal consent for this virtual visit at the beginning of the encounter.  Current Medications:  Current Outpatient Medications:    ALPRAZolam (XANAX) 1 MG tablet, Take 1 mg by mouth 2 (two) times daily., Disp: , Rfl:    amoxicillin-clavulanate (AUGMENTIN) 875-125 MG tablet, Take 1 tablet by mouth 2 (two) times daily., Disp: 20 tablet, Rfl: 0   budesonide-formoterol (SYMBICORT) 160-4.5 MCG/ACT inhaler, Inhale 2 puffs into the lungs 2 (two) times daily as needed (for flares)., Disp: , Rfl:    cyanocobalamin (,VITAMIN B-12,) 1000 MCG/ML injection, Inject 1,000 mcg into the skin every 30 (thirty) days. , Disp: , Rfl: 0   dicyclomine (BENTYL) 10 MG capsule, TAKE 1 CAPSULE BY MOUTH UP TO THREE TIMES DAILY AS NEEDED FOR SPASMS OR ABDOMINAL PAIN (Patient taking differently: as needed. TAKE 1 CAPSULE BY MOUTH UP TO THREE TIMES DAILY AS NEEDED FOR SPASMS OR ABDOMINAL PAIN), Disp: 120 capsule, Rfl: 1   diphenhydrAMINE HCl (BENADRYL PO), Take by mouth., Disp: , Rfl:    fluconazole (DIFLUCAN) 150 MG tablet, Take 1 now and 1 in 3 days if needed, Disp: 2 tablet, Rfl: 1   ketorolac (TORADOL) 10 MG tablet, Take 1 tablet (10 mg total) by mouth every 6 (six) hours as needed., Disp: 20 tablet, Rfl: 0   methocarbamol (ROBAXIN) 750 MG tablet, Take 750 mg by mouth at bedtime., Disp: , Rfl:    omeprazole (PRILOSEC) 20 MG capsule, Take 20 mg by mouth daily., Disp: ,  Rfl:    ondansetron (ZOFRAN-ODT) 4 MG disintegrating tablet, DISSOLVE 1 TABLET(4 MG) ON THE TONGUE EVERY 8 HOURS AS NEEDED FOR NAUSEA OR VOMITING, Disp: 30 tablet, Rfl: 6   promethazine (PHENERGAN) 25 MG tablet, Take 1 tablet (25 mg total) by mouth every 8 (eight) hours as needed for nausea or vomiting. May cause drowsiness, Disp: 21 tablet, Rfl: 0   SERTRALINE HCL PO, Take 50 mg by mouth daily., Disp: , Rfl:    Ubrogepant (UBRELVY) 100 MG TABS, Take 1 tablet (100 mg total) by mouth as needed (take 1 tablet at onset of headache, may repeat 2 hours after initial dose, max is 200 mg/24 hours)., Disp: 12 tablet, Rfl: 11   VITAMIN D PO, Take by mouth., Disp: , Rfl:    zonisamide (ZONEGRAN) 100 MG capsule, Take 100 mg by mouth daily., Disp: , Rfl:    Medications ordered in this encounter:  No orders of the defined types were placed in this encounter.    *If you need refills on other medications prior to your next appointment, please contact your pharmacy*  Follow-Up: Call back or seek an in-person evaluation if the symptoms worsen or if the condition fails to improve as anticipated.  [facility] [phone]  Other Instructions Food Choices to Help Relieve Diarrhea, Adult Diarrhea can make you feel weak and cause you to become dehydrated. Dehydration is a condition in which there is not enough water or other  fluids in the body. It is important to choose the right foods and drinks to: Relieve diarrhea. Replace lost fluids and nutrients. Prevent dehydration. What are tips for following this plan? Relieving diarrhea Avoid foods that make your diarrhea worse. These may include: Foods and drinks that are sweetened with high-fructose corn syrup, honey, or sweeteners such as xylitol, sorbitol, and mannitol. Check food labels for these ingredients. Fried, greasy, or spicy foods. Raw fruits and vegetables. Eat foods that are rich in probiotics. These include foods such as yogurt and  fermented milk products. Probiotics can help increase healthy bacteria in your stomach and intestines (gastrointestinal or GI tract). This may help digestion and stop diarrhea. If you have lactose intolerance, avoid dairy products. These may make your diarrhea worse. Take medicine to help stop diarrhea only as told by your health care provider. Replacing nutrients  Eat bland, easy-to-digest foods in small amounts as you are able, until your diarrhea starts to get better. These foods include bananas, applesauce, rice, toast, and crackers. Over time, add nutrient-rich foods as your body tolerates them or as told by your health care provider. These include: Well-cooked protein foods, such as eggs, lean meats like fish or chicken without skin, and tofu. Peeled, seeded, and soft-cooked fruits and vegetables. Low-fat dairy products. Whole grains. Take vitamin and mineral supplements as told by your health care provider. Preventing dehydration  Start by sipping water or a solution to prevent dehydration (oral rehydration solution, or ORS). This is a drink that helps replace fluids and minerals your body has lost. You can buy an ORS at pharmacies and retail stores. Try to drink at least 8-10 cups (2,000-2,500 mL) of fluid each day to help replace lost fluids. If your urine is pale yellow, you are getting enough fluids. You may drink other liquids in addition to water, such as fruit juice that you have added water to (diluted fruit juice) or low-calorie sports drinks, as tolerated or as told by your health care provider. Avoid drinks with caffeine, such as coffee, tea, or soft drinks. Avoid alcohol. This information is not intended to replace advice given to you by your health care provider. Make sure you discuss any questions you have with your health care provider. Document Revised: 04/06/2022 Document Reviewed: 04/06/2022 Elsevier Patient Education  2024 Elsevier Inc.   If you have been instructed  to have an in-person evaluation today at a local Urgent Care facility, please use the link below. It will take you to a list of all of our available [facility], including address, phone number and hours of operation. Please do not delay care.  [facility]  If you or a family member do not have a primary care provider, use the link below to schedule a visit and establish care. When you choose a [facility] primary care physician or advanced practice provider, you gain a long-term partner in health. Find a Primary Care Provider  Learn more about [facility]'s in-office and virtual care options: [facility] - Get Care Now

## 2023-12-15 NOTE — Progress Notes (Signed)
Virtual Visit Consent   Mary Parker, you are scheduled for a virtual visit with a [facility] provider today. Just as with appointments in the office, your consent must be obtained to participate. Your consent will be active for this visit and any virtual visit you may have with one of our providers in the next 365 days. If you have a MyChart account, a copy of this consent can be sent to you electronically.  As this is a virtual visit, video technology does not allow for your provider to perform a traditional examination. This may limit your provider's ability to fully assess your condition. If your provider identifies any concerns that need to be evaluated in person or the need to arrange testing (such as labs, EKG, etc.), we will make arrangements to do so. Although advances in technology are sophisticated, we cannot ensure that it will always work on either your end or our end. If the connection with a video visit is poor, the visit may have to be switched to a telephone visit. With either a video or telephone visit, we are not always able to ensure that we have a secure connection.  By engaging in this virtual visit, you consent to the provision of healthcare and authorize for your insurance to be billed (if applicable) for the services provided during this visit. Depending on your insurance coverage, you may receive a charge related to this service.  I need to obtain your verbal consent now. Are you willing to proceed with your visit today? Mary Parker has provided verbal consent on 12/15/2023 for a virtual visit (video or telephone). Piedad Climes, [place]  Date: 12/15/2023 12:48 PM   Virtual Visit via Video Note   I, Piedad Climes, connected with  Mary Parker  ([id number], [birth year]) on 12/15/23 at 12:45 PM EST by a video-enabled telemedicine application and verified that I am speaking with the correct person using two identifiers.  Location: Patient: Virtual Visit Location  Patient: Home Provider: Virtual Visit Location Provider: Home Office   I discussed the limitations of evaluation and management by telemedicine and the availability of in person appointments. The patient expressed understanding and agreed to proceed.    History of Present Illness: Mary Parker is a 43 y.o. who identifies as a female who was assigned female at birth, and is being seen today for 2 days of frequent diarrhea with nausea. Last night started with non-bloody emesis and increased nausea. Notes sweats and chills with this. Unsure of actual fever.  Mother with similar symptoms.  OTC -- Nothing  HPI: HPI  Problems:  Patient Active Problem List   Diagnosis Date Noted   S/P laser ablation of the cervix for HSIL 11/17/2023   Breast pain, left 11/17/2023   Subareolar mass of left breast 11/17/2023   Weight loss of more than 10% body weight 06/27/2023   Mood disorder (HCC) 01/05/2023   Hemorrhoids, thrombosed 04/09/2022   Prolapsed internal hemorrhoids, grade 2 11/25/2021   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/09/2021   Rectal pain 12/22/2020   Nausea without vomiting 12/22/2020   Early satiety 12/22/2020   Loss of weight 12/22/2020   RUQ abdominal pain 10/15/2020   OSA (obstructive sleep apnea) 09/08/2020   Sleep walking and eating 07/30/2020   Excessive daytime sleepiness 07/30/2020   Class 3 severe obesity due to excess calories without serious comorbidity with body mass index (BMI) of 40.0 to 44.9 in adult [facility])  07/30/2020   Psychophysiological insomnia 07/30/2020   Loud snoring 07/30/2020   Chronic migraine without aura, with intractable migraine, so stated, with status migrainosus 07/12/2019   Cigarette smoker 05/04/2019   Obesity, Class II, BMI 35-39.9 05/04/2019   Stroke-like episode (HCC) s/p tPA 05/03/2019   Acute gastroenteritis 03/29/2019   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   S/P abdominal supracervical subtotal  hysterectomy 06/21/2017   Pelvic adhesions 06/09/2017   History of ovarian cyst 06/09/2017   Right ovarian cyst 04/25/2017   Rectal bleeding 04/02/2017   LLQ pain 04/01/2017   Constipation 04/01/2017   Concussion with loss of consciousness 11/16/2016   Anxiety 10/14/2016   Chronic low back pain 05/29/2015   Complicated migraine 12/19/2014   Fibromyalgia 12/19/2014   Rebound headache 12/19/2014   Depression 12/11/2014   Body aches 12/11/2014    Allergies:  Allergies  Allergen Reactions   Bupropion Other (See Comments)    Suicidal ideation    Duloxetine Hcl Hives, Itching and Rash   Gabapentin Itching   Meloxicam Other (See Comments)    Abdominal cramping and mood changes   Pregabalin Other (See Comments)    Caused TREMORS    Amitriptyline Other (See Comments)    Allergy occurred awhile back- reaction not recalled   Ciprofloxacin Hives, Swelling and Other (See Comments)    Sweating and  Dizziness, also   Desvenlafaxine Succinate Er Other (See Comments)    "Made me feel like I couldn't urinate"   Hydrocodone-Acetaminophen Rash   Milnacipran Nausea Only and Other (See Comments)    Dizziness and leg cramps, also    Nortriptyline Nausea And Vomiting and Other (See Comments)    GERD, also   Topiramate Swelling and Other (See Comments)    Dizziness and slurred speech, also   Decadron [Dexamethasone] Other (See Comments)    Flushing, dizziness, and the patient PASSED OUT   Triptans Other (See Comments)    Hemiplegic migraines    Doxycycline Nausea Only   Percocet [Oxycodone-Acetaminophen] Nausea Only   Sumatriptan Rash   Medications:  Current Outpatient Medications:    ondansetron (ZOFRAN-ODT) 4 MG disintegrating tablet, Take 1 tablet (4 mg total) by mouth every 8 (eight) hours as needed for nausea or vomiting., Disp: 20 tablet, Rfl: 0   ALPRAZolam (XANAX) 1 MG tablet, Take 1 mg by mouth 2 (two) times daily., Disp: , Rfl:    budesonide-formoterol (SYMBICORT) 160-4.5  MCG/ACT inhaler, Inhale 2 puffs into the lungs 2 (two) times daily as needed (for flares)., Disp: , Rfl:    cyanocobalamin (,VITAMIN B-12,) 1000 MCG/ML injection, Inject 1,000 mcg into the skin every 30 (thirty) days. , Disp: , Rfl: 0   diphenhydrAMINE HCl (BENADRYL PO), Take by mouth., Disp: , Rfl:    methocarbamol (ROBAXIN) 750 MG tablet, Take 750 mg by mouth at bedtime., Disp: , Rfl:    omeprazole (PRILOSEC) 20 MG capsule, Take 20 mg by mouth daily., Disp: , Rfl:    promethazine (PHENERGAN) 25 MG tablet, Take 1 tablet (25 mg total) by mouth every 8 (eight) hours as needed for nausea or vomiting. May cause drowsiness, Disp: 21 tablet, Rfl: 0   SERTRALINE HCL PO, Take 50 mg by mouth daily., Disp: , Rfl:    Ubrogepant (UBRELVY) 100 MG TABS, Take 1 tablet (100 mg total) by mouth as needed (take 1 tablet at onset of headache, may repeat 2 hours after initial dose, max is 200 mg/24 hours)., Disp: 12 tablet, Rfl: 11   zonisamide (ZONEGRAN) 100  MG capsule, Take 100 mg by mouth daily., Disp: , Rfl:   Observations/Objective: Patient is well-developed, well-nourished in no acute distress.  Resting comfortably at home.  Head is normocephalic, atraumatic.  No labored breathing. Speech is clear and coherent with logical content.  Patient is alert and oriented at baseline.   Assessment and Plan: 1. Viral gastroenteritis (Primary) - ondansetron (ZOFRAN-ODT) 4 MG disintegrating tablet; Take 1 tablet (4 mg total) by mouth every 8 (eight) hours as needed for nausea or vomiting.  Dispense: 20 tablet; Refill: 0  Supportive measures and OTC medications reviewed. Start Imodium OTC. Brat diet reviewed. Handout sent. Zofran per orders. Work note provided. Strict ER precautions reviewed with patient.   Follow Up Instructions: I discussed the assessment and treatment plan with the patient. The patient was provided an opportunity to ask questions and all were answered. The patient agreed with the plan and  demonstrated an understanding of the instructions.  A copy of instructions were sent to the patient via MyChart unless otherwise noted below.   The patient was advised to call back or seek an in-person evaluation if the symptoms worsen or if the condition fails to improve as anticipated.    Piedad Climes, PA-C

## 2023-12-23 ENCOUNTER — Telehealth: Payer: Self-pay | Admitting: Pharmacist

## 2023-12-23 DIAGNOSIS — F4312 Post-traumatic stress disorder, chronic: Secondary | ICD-10-CM | POA: Diagnosis not present

## 2023-12-23 NOTE — Telephone Encounter (Signed)
Pharmacy Patient Advocate Encounter   Received notification from Patient Pharmacy that prior authorization for Ubrelvy 100mg  tablets is required/requested.   Insurance verification completed.   The patient is insured through [facility] .

## 2023-12-25 DIAGNOSIS — I10 Essential (primary) hypertension: Secondary | ICD-10-CM | POA: Diagnosis not present

## 2023-12-25 DIAGNOSIS — R000 Tachycardia, unspecified: Secondary | ICD-10-CM | POA: Diagnosis not present

## 2023-12-26 DIAGNOSIS — F4312 Post-traumatic stress disorder, chronic: Secondary | ICD-10-CM | POA: Diagnosis not present

## 2023-12-28 DIAGNOSIS — S4992XA Unspecified injury of left shoulder and upper arm, initial encounter: Secondary | ICD-10-CM | POA: Diagnosis not present

## 2023-12-28 DIAGNOSIS — M542 Cervicalgia: Secondary | ICD-10-CM | POA: Diagnosis not present

## 2023-12-28 DIAGNOSIS — T1490XA Injury, unspecified, initial encounter: Secondary | ICD-10-CM | POA: Diagnosis not present

## 2023-12-28 DIAGNOSIS — M25512 Pain in left shoulder: Secondary | ICD-10-CM | POA: Diagnosis not present

## 2023-12-29 DIAGNOSIS — S0990XD Unspecified injury of head, subsequent encounter: Secondary | ICD-10-CM | POA: Diagnosis not present

## 2023-12-29 DIAGNOSIS — G43109 Migraine with aura, not intractable, without status migrainosus: Secondary | ICD-10-CM | POA: Diagnosis not present

## 2023-12-30 DIAGNOSIS — F4312 Post-traumatic stress disorder, chronic: Secondary | ICD-10-CM | POA: Diagnosis not present

## 2024-01-03 ENCOUNTER — Encounter (HOSPITAL_COMMUNITY): Payer: Self-pay

## 2024-01-03 ENCOUNTER — Ambulatory Visit (HOSPITAL_COMMUNITY)
Admission: RE | Admit: 2024-01-03 | Discharge: 2024-01-03 | Disposition: A | Discharge status: Home / Self Care | Payer: BC Managed Care – PPO | Source: Ambulatory Visit | Attending: Adult Health | Admitting: Adult Health

## 2024-01-03 DIAGNOSIS — Z6831 Body mass index (BMI) 31.0-31.9, adult: Secondary | ICD-10-CM | POA: Diagnosis not present

## 2024-01-03 DIAGNOSIS — N6342 Unspecified lump in left breast, subareolar: Secondary | ICD-10-CM

## 2024-01-03 DIAGNOSIS — L309 Dermatitis, unspecified: Secondary | ICD-10-CM | POA: Diagnosis not present

## 2024-01-03 DIAGNOSIS — N644 Mastodynia: Secondary | ICD-10-CM

## 2024-01-03 DIAGNOSIS — E669 Obesity, unspecified: Secondary | ICD-10-CM | POA: Diagnosis not present

## 2024-01-03 DIAGNOSIS — R92322 Mammographic fibroglandular density, left breast: Secondary | ICD-10-CM | POA: Diagnosis not present

## 2024-01-03 DIAGNOSIS — N6320 Unspecified lump in the left breast, unspecified quadrant: Secondary | ICD-10-CM | POA: Diagnosis not present

## 2024-01-06 DIAGNOSIS — F4312 Post-traumatic stress disorder, chronic: Secondary | ICD-10-CM | POA: Diagnosis not present

## 2024-01-09 DIAGNOSIS — M791 Myalgia, unspecified site: Secondary | ICD-10-CM | POA: Diagnosis not present

## 2024-01-09 DIAGNOSIS — M542 Cervicalgia: Secondary | ICD-10-CM | POA: Diagnosis not present

## 2024-01-09 DIAGNOSIS — G518 Other disorders of facial nerve: Secondary | ICD-10-CM | POA: Diagnosis not present

## 2024-01-09 DIAGNOSIS — G43719 Chronic migraine without aura, intractable, without status migrainosus: Secondary | ICD-10-CM | POA: Diagnosis not present

## 2024-01-10 DIAGNOSIS — F4312 Post-traumatic stress disorder, chronic: Secondary | ICD-10-CM | POA: Diagnosis not present

## 2024-01-16 DIAGNOSIS — M533 Sacrococcygeal disorders, not elsewhere classified: Secondary | ICD-10-CM | POA: Diagnosis not present

## 2024-01-17 DIAGNOSIS — G43109 Migraine with aura, not intractable, without status migrainosus: Secondary | ICD-10-CM | POA: Diagnosis not present

## 2024-01-26 ENCOUNTER — Encounter: Payer: Self-pay | Admitting: Orthopedic Surgery

## 2024-01-27 DIAGNOSIS — F4312 Post-traumatic stress disorder, chronic: Secondary | ICD-10-CM | POA: Diagnosis not present

## 2024-02-02 ENCOUNTER — Telehealth: Admitting: Family Medicine

## 2024-02-02 DIAGNOSIS — J069 Acute upper respiratory infection, unspecified: Secondary | ICD-10-CM | POA: Diagnosis not present

## 2024-02-02 MED ORDER — CETIRIZINE HCL 10 MG PO TABS: 10.0000 mg | ORAL_TABLET | Freq: Every day | ORAL | 0 refills | Status: DC

## 2024-02-02 MED ORDER — IPRATROPIUM BROMIDE 0.03 % NA SOLN: 2.0000 | Freq: Two times a day (BID) | NASAL | 0 refills | Status: DC

## 2024-02-02 MED ORDER — PSEUDOEPH-BROMPHEN-DM 30-2-10 MG/5ML PO SYRP: 5.0000 mL | ORAL_SOLUTION | Freq: Four times a day (QID) | ORAL | 0 refills | Status: DC | PRN

## 2024-02-02 NOTE — Progress Notes (Signed)
 Virtual Visit Consent   Mary Parker, you are scheduled for a virtual visit with a [facility] provider today. Just as with appointments in the office, your consent must be obtained to participate. Your consent will be active for this visit and any virtual visit you may have with one of our providers in the next 365 days. If you have a MyChart account, a copy of this consent can be sent to you electronically.  As this is a virtual visit, video technology does not allow for your provider to perform a traditional examination. This may limit your provider's ability to fully assess your condition. If your provider identifies any concerns that need to be evaluated in person or the need to arrange testing (such as labs, EKG, etc.), we will make arrangements to do so. Although advances in technology are sophisticated, we cannot ensure that it will always work on either your end or our end. If the connection with a video visit is poor, the visit may have to be switched to a telephone visit. With either a video or telephone visit, we are not always able to ensure that we have a secure connection.  By engaging in this virtual visit, you consent to the provision of healthcare and authorize for your insurance to be billed (if applicable) for the services provided during this visit. Depending on your insurance coverage, you may receive a charge related to this service.  I need to obtain your verbal consent now. Are you willing to proceed with your visit today? Mary Parker has provided verbal consent on 02/02/2024 for a virtual visit (video or telephone). Freddy Finner, NP  Date: 02/02/2024 2:17 PM   Virtual Visit via Video Note   I, Freddy Finner, connected with  Mary Parker  ([id number], Apr 05, 1981) on 02/02/24 at  2:15 PM EDT by a video-enabled telemedicine application and verified that I am speaking with the correct person using two identifiers.  Location: Patient: Virtual Visit Location Patient:  Home Provider: Virtual Visit Location Provider: Home Office   I discussed the limitations of evaluation and management by telemedicine and the availability of in person appointments. The patient expressed understanding and agreed to proceed.    History of Present Illness: Mary Parker is a 43 y.o. who identifies as a female who was assigned female at birth, and is being seen today for congestion and sore throat  Onset was  two days ago- sore throat Associated symptoms are "feels like I have been hit by a truck" headache, congestion, bodyaches  Modifying factors are taking amoxicillin for tooth abscess  Denies chest pain, shortness of breath, fevers, chills  Exposure to sick contacts- unknown COVID/Flu test: has not taken   Problems:  Patient Active Problem List   Diagnosis Date Noted   S/P laser ablation of the cervix for HSIL 11/17/2023   Breast pain, left 11/17/2023   Subareolar mass of left breast 11/17/2023   Weight loss of more than 10% body weight 06/27/2023   Mood disorder (HCC) 01/05/2023   Hemorrhoids, thrombosed 04/09/2022   Prolapsed internal hemorrhoids, grade 2 11/25/2021   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/09/2021   Rectal pain 12/22/2020   Nausea without vomiting 12/22/2020   Early satiety 12/22/2020   Loss of weight 12/22/2020   RUQ abdominal pain 10/15/2020   OSA (obstructive sleep apnea) 09/08/2020   Sleep walking and eating 07/30/2020   Excessive daytime sleepiness 07/30/2020   Class 3  severe obesity due to excess calories without serious comorbidity with body mass index (BMI) of 40.0 to 44.9 in adult (HCC) 07/30/2020   Psychophysiological insomnia 07/30/2020   Loud snoring 07/30/2020   Chronic migraine without aura, with intractable migraine, so stated, with status migrainosus 07/12/2019   Cigarette smoker 05/04/2019   Obesity, Class II, BMI 35-39.9 05/04/2019   Stroke-like episode (HCC) s/p tPA 05/03/2019    Acute gastroenteritis 03/29/2019   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   S/P abdominal supracervical subtotal hysterectomy 06/21/2017   Pelvic adhesions 06/09/2017   History of ovarian cyst 06/09/2017   Right ovarian cyst 04/25/2017   Rectal bleeding 04/02/2017   LLQ pain 04/01/2017   Constipation 04/01/2017   Concussion with loss of consciousness 11/16/2016   Anxiety 10/14/2016   Chronic low back pain 05/29/2015   Complicated migraine 12/19/2014   Fibromyalgia 12/19/2014   Rebound headache 12/19/2014   Depression 12/11/2014   Body aches 12/11/2014    Allergies:  Allergies  Allergen Reactions   Bupropion Other (See Comments)    Suicidal ideation    Duloxetine Hcl Hives, Itching and Rash   Gabapentin Itching   Meloxicam Other (See Comments)    Abdominal cramping and mood changes   Pregabalin Other (See Comments)    Caused TREMORS    Amitriptyline Other (See Comments)    Allergy occurred awhile back- reaction not recalled   Ciprofloxacin Hives, Swelling and Other (See Comments)    Sweating and  Dizziness, also   Desvenlafaxine Succinate Er Other (See Comments)    "Made me feel like I couldn't urinate"   Hydrocodone-Acetaminophen Rash   Milnacipran Nausea Only and Other (See Comments)    Dizziness and leg cramps, also    Nortriptyline Nausea And Vomiting and Other (See Comments)    GERD, also   Topiramate Swelling and Other (See Comments)    Dizziness and slurred speech, also   Decadron [Dexamethasone] Other (See Comments)    Flushing, dizziness, and the patient PASSED OUT   Triptans Other (See Comments)    Hemiplegic migraines    Doxycycline Nausea Only   Percocet [Oxycodone-Acetaminophen] Nausea Only   Sumatriptan Rash   Medications:  Current Outpatient Medications:    ALPRAZolam (XANAX) 1 MG tablet, Take 1 mg by mouth 2 (two) times daily., Disp: , Rfl:    budesonide-formoterol (SYMBICORT) 160-4.5 MCG/ACT inhaler, Inhale 2 puffs into the lungs 2 (two) times  daily as needed (for flares)., Disp: , Rfl:    cyanocobalamin (,VITAMIN B-12,) 1000 MCG/ML injection, Inject 1,000 mcg into the skin every 30 (thirty) days. , Disp: , Rfl: 0   diphenhydrAMINE HCl (BENADRYL PO), Take by mouth., Disp: , Rfl:    methocarbamol (ROBAXIN) 750 MG tablet, Take 750 mg by mouth at bedtime., Disp: , Rfl:    omeprazole (PRILOSEC) 20 MG capsule, Take 20 mg by mouth daily., Disp: , Rfl:    ondansetron (ZOFRAN-ODT) 4 MG disintegrating tablet, Take 1 tablet (4 mg total) by mouth every 8 (eight) hours as needed for nausea or vomiting., Disp: 20 tablet, Rfl: 0   promethazine (PHENERGAN) 25 MG tablet, Take 1 tablet (25 mg total) by mouth every 8 (eight) hours as needed for nausea or vomiting. May cause drowsiness, Disp: 21 tablet, Rfl: 0   SERTRALINE HCL PO, Take 50 mg by mouth daily., Disp: , Rfl:    Ubrogepant (UBRELVY) 100 MG TABS, Take 1 tablet (100 mg total) by mouth as needed (take 1 tablet at onset of headache, may  repeat 2 hours after initial dose, max is 200 mg/24 hours)., Disp: 12 tablet, Rfl: 11   zonisamide (ZONEGRAN) 100 MG capsule, Take 100 mg by mouth daily., Disp: , Rfl:   Observations/Objective: Patient is well-developed, well-nourished in no acute distress.  Resting comfortably  at home.  Head is normocephalic, atraumatic.  No labored breathing.  Speech is clear and coherent with logical content.  Patient is alert and oriented at baseline.  Congestion noted   Assessment and Plan:  1. Viral URI (Primary)  - ipratropium (ATROVENT) 0.03 % nasal spray; Place 2 sprays into both nostrils every 12 (twelve) hours.  Dispense: 30 mL; Refill: 0 - cetirizine (ZYRTEC ALLERGY) 10 MG tablet; Take 1 tablet (10 mg total) by mouth daily.  Dispense: 30 tablet; Refill: 0 - brompheniramine-pseudoephedrine-DM 30-2-10 MG/5ML syrup; Take 5 mLs by mouth 4 (four) times daily as needed.  Dispense: 120 mL; Refill: 0   -test for covid and flu (message back if +)  URI  recommendations: - Increased rest - Increasing Fluids - Acetaminophen / ibuprofen as needed for fever/pain.  - Salt water gargling, chloraseptic spray and throat lozenges - Mucinex if mucus is present and increasing.  - Saline nasal spray if congestion or if nasal passages feel dry. - Humidifying the air.  -start about medications as directed    Reviewed side effects, risks and benefits of medication.    Patient acknowledged agreement and understanding of the plan.   Past Medical, Surgical, Social History, Allergies, and Medications have been Reviewed.      Follow Up Instructions: I discussed the assessment and treatment plan with the patient. The patient was provided an opportunity to ask questions and all were answered. The patient agreed with the plan and demonstrated an understanding of the instructions.  A copy of instructions were sent to the patient via MyChart unless otherwise noted below.     The patient was advised to call back or seek an in-person evaluation if the symptoms worsen or if the condition fails to improve as anticipated.    Freddy Finner, NP

## 2024-02-02 NOTE — Patient Instructions (Signed)
 [name], thank you for joining Freddy Finner, NP for today's virtual visit.  While this provider is not your primary care provider (PCP), if your PCP is located in our provider database this encounter information will be shared with them immediately following your visit.   A [facility] MyChart account gives you access to today's visit and all your visits, tests, and labs performed at [facility] " click here if you don't have a  MyChart account or go to [email or web address]  Consent: (Patient) Mary Parker provided verbal consent for this virtual visit at the beginning of the encounter.  Current Medications:  Current Outpatient Medications:    brompheniramine-pseudoephedrine-DM 30-2-10 MG/5ML syrup, Take 5 mLs by mouth 4 (four) times daily as needed., Disp: 120 mL, Rfl: 0   cetirizine (ZYRTEC ALLERGY) 10 MG tablet, Take 1 tablet (10 mg total) by mouth daily., Disp: 30 tablet, Rfl: 0   ipratropium (ATROVENT) 0.03 % nasal spray, Place 2 sprays into both nostrils every 12 (twelve) hours., Disp: 30 mL, Rfl: 0   ALPRAZolam (XANAX) 1 MG tablet, Take 1 mg by mouth 2 (two) times daily., Disp: , Rfl:    budesonide-formoterol (SYMBICORT) 160-4.5 MCG/ACT inhaler, Inhale 2 puffs into the lungs 2 (two) times daily as needed (for flares)., Disp: , Rfl:    cyanocobalamin (,VITAMIN B-12,) 1000 MCG/ML injection, Inject 1,000 mcg into the skin every 30 (thirty) days. , Disp: , Rfl: 0   diphenhydrAMINE HCl (BENADRYL PO), Take by mouth., Disp: , Rfl:    methocarbamol (ROBAXIN) 750 MG tablet, Take 750 mg by mouth at bedtime., Disp: , Rfl:    omeprazole (PRILOSEC) 20 MG capsule, Take 20 mg by mouth daily., Disp: , Rfl:    ondansetron (ZOFRAN-ODT) 4 MG disintegrating tablet, Take 1 tablet (4 mg total) by mouth every 8 (eight) hours as needed for nausea or vomiting., Disp: 20 tablet, Rfl: 0   promethazine (PHENERGAN) 25 MG tablet, Take 1 tablet (25 mg total) by mouth every 8  (eight) hours as needed for nausea or vomiting. May cause drowsiness, Disp: 21 tablet, Rfl: 0   SERTRALINE HCL PO, Take 50 mg by mouth daily., Disp: , Rfl:    Ubrogepant (UBRELVY) 100 MG TABS, Take 1 tablet (100 mg total) by mouth as needed (take 1 tablet at onset of headache, may repeat 2 hours after initial dose, max is 200 mg/24 hours)., Disp: 12 tablet, Rfl: 11   zonisamide (ZONEGRAN) 100 MG capsule, Take 100 mg by mouth daily., Disp: , Rfl:    Medications ordered in this encounter:  Meds ordered this encounter  Medications   ipratropium (ATROVENT) 0.03 % nasal spray    Sig: Place 2 sprays into both nostrils every 12 (twelve) hours.    Dispense:  30 mL    Refill:  0    Supervising Provider:   Merrilee Jansky [[id number]]   cetirizine (ZYRTEC ALLERGY) 10 MG tablet    Sig: Take 1 tablet (10 mg total) by mouth daily.    Dispense:  30 tablet    Refill:  0    Supervising Provider:   Merrilee Jansky [[id number]]   brompheniramine-pseudoephedrine-DM 30-2-10 MG/5ML syrup    Sig: Take 5 mLs by mouth 4 (four) times daily as needed.    Dispense:  120 mL    Refill:  0    Supervising Provider:   Merrilee Jansky [id number]     *If you need refills on other medications prior to your next  appointment, please contact your pharmacy*  Follow-Up: Call back or seek an in-person evaluation if the symptoms worsen or if the condition fails to improve as anticipated.  [facility] [phone]  Other Instructions  URI recommendations: - Increased rest - Increasing Fluids - Acetaminophen / ibuprofen as needed for fever/pain.  - Salt water gargling, chloraseptic spray and throat lozenges - Mucinex if mucus is present and increasing.  - Saline nasal spray if congestion or if nasal passages feel dry. - Humidifying the air.      If you have been instructed to have an in-person evaluation today at a local Urgent Care facility, please use the link below. It will take you to a list of  all of our available [facility], including address, phone number and hours of operation. Please do not delay care.  [facility]  If you or a family member do not have a primary care provider, use the link below to schedule a visit and establish care. When you choose a [facility] primary care physician or advanced practice provider, you gain a long-term partner in health. Find a Primary Care Provider  Learn more about [facility]'s in-office and virtual care options: [facility] - Get Care Now

## 2024-02-10 ENCOUNTER — Other Ambulatory Visit: Payer: Self-pay | Admitting: Orthopedic Surgery

## 2024-02-10 DIAGNOSIS — M533 Sacrococcygeal disorders, not elsewhere classified: Secondary | ICD-10-CM

## 2024-02-14 DIAGNOSIS — G43719 Chronic migraine without aura, intractable, without status migrainosus: Secondary | ICD-10-CM | POA: Diagnosis not present

## 2024-02-14 DIAGNOSIS — M791 Myalgia, unspecified site: Secondary | ICD-10-CM | POA: Diagnosis not present

## 2024-02-14 DIAGNOSIS — G518 Other disorders of facial nerve: Secondary | ICD-10-CM | POA: Diagnosis not present

## 2024-02-14 DIAGNOSIS — M542 Cervicalgia: Secondary | ICD-10-CM | POA: Diagnosis not present

## 2024-02-15 DIAGNOSIS — F329 Major depressive disorder, single episode, unspecified: Secondary | ICD-10-CM | POA: Diagnosis not present

## 2024-02-15 DIAGNOSIS — Z79899 Other long term (current) drug therapy: Secondary | ICD-10-CM | POA: Diagnosis not present

## 2024-02-15 DIAGNOSIS — J302 Other seasonal allergic rhinitis: Secondary | ICD-10-CM | POA: Diagnosis not present

## 2024-02-17 DIAGNOSIS — F411 Generalized anxiety disorder: Secondary | ICD-10-CM | POA: Diagnosis not present

## 2024-02-17 DIAGNOSIS — F320 Major depressive disorder, single episode, mild: Secondary | ICD-10-CM | POA: Diagnosis not present

## 2024-02-24 ENCOUNTER — Ambulatory Visit
Admission: RE | Admit: 2024-02-24 | Discharge: 2024-02-24 | Disposition: A | Discharge status: Home / Self Care | Source: Ambulatory Visit | Attending: Orthopedic Surgery | Admitting: Orthopedic Surgery

## 2024-02-24 DIAGNOSIS — M461 Sacroiliitis, not elsewhere classified: Secondary | ICD-10-CM | POA: Diagnosis not present

## 2024-02-24 DIAGNOSIS — M533 Sacrococcygeal disorders, not elsewhere classified: Secondary | ICD-10-CM

## 2024-02-24 MED ORDER — METHYLPREDNISOLONE ACETATE 80 MG/ML IJ SUSP: 80.0000 mg | Freq: Once | INTRAMUSCULAR | Status: DC

## 2024-03-02 DIAGNOSIS — F4312 Post-traumatic stress disorder, chronic: Secondary | ICD-10-CM | POA: Diagnosis not present

## 2024-03-09 DIAGNOSIS — I10 Essential (primary) hypertension: Secondary | ICD-10-CM | POA: Diagnosis not present

## 2024-03-09 DIAGNOSIS — F4312 Post-traumatic stress disorder, chronic: Secondary | ICD-10-CM | POA: Diagnosis not present

## 2024-03-12 DIAGNOSIS — M542 Cervicalgia: Secondary | ICD-10-CM | POA: Diagnosis not present

## 2024-03-12 DIAGNOSIS — Z0001 Encounter for general adult medical examination with abnormal findings: Secondary | ICD-10-CM | POA: Diagnosis not present

## 2024-03-12 DIAGNOSIS — J302 Other seasonal allergic rhinitis: Secondary | ICD-10-CM | POA: Diagnosis not present

## 2024-03-12 DIAGNOSIS — F5105 Insomnia due to other mental disorder: Secondary | ICD-10-CM | POA: Diagnosis not present

## 2024-03-12 DIAGNOSIS — F411 Generalized anxiety disorder: Secondary | ICD-10-CM | POA: Diagnosis not present

## 2024-03-12 DIAGNOSIS — Z0000 Encounter for general adult medical examination without abnormal findings: Secondary | ICD-10-CM | POA: Diagnosis not present

## 2024-03-12 DIAGNOSIS — F329 Major depressive disorder, single episode, unspecified: Secondary | ICD-10-CM | POA: Diagnosis not present

## 2024-03-13 DIAGNOSIS — M542 Cervicalgia: Secondary | ICD-10-CM | POA: Diagnosis not present

## 2024-03-13 DIAGNOSIS — G518 Other disorders of facial nerve: Secondary | ICD-10-CM | POA: Diagnosis not present

## 2024-03-13 DIAGNOSIS — M791 Myalgia, unspecified site: Secondary | ICD-10-CM | POA: Diagnosis not present

## 2024-03-13 DIAGNOSIS — G43719 Chronic migraine without aura, intractable, without status migrainosus: Secondary | ICD-10-CM | POA: Diagnosis not present

## 2024-03-16 DIAGNOSIS — F4312 Post-traumatic stress disorder, chronic: Secondary | ICD-10-CM | POA: Diagnosis not present

## 2024-03-23 DIAGNOSIS — F4312 Post-traumatic stress disorder, chronic: Secondary | ICD-10-CM | POA: Diagnosis not present

## 2024-03-30 DIAGNOSIS — F4312 Post-traumatic stress disorder, chronic: Secondary | ICD-10-CM | POA: Diagnosis not present

## 2024-04-06 DIAGNOSIS — F4312 Post-traumatic stress disorder, chronic: Secondary | ICD-10-CM | POA: Diagnosis not present

## 2024-04-13 DIAGNOSIS — F4312 Post-traumatic stress disorder, chronic: Secondary | ICD-10-CM | POA: Diagnosis not present

## 2024-04-20 DIAGNOSIS — F4312 Post-traumatic stress disorder, chronic: Secondary | ICD-10-CM | POA: Diagnosis not present

## 2024-04-27 DIAGNOSIS — F4312 Post-traumatic stress disorder, chronic: Secondary | ICD-10-CM | POA: Diagnosis not present

## 2024-04-27 DIAGNOSIS — F603 Borderline personality disorder: Secondary | ICD-10-CM | POA: Diagnosis not present

## 2024-04-27 DIAGNOSIS — F331 Major depressive disorder, recurrent, moderate: Secondary | ICD-10-CM | POA: Diagnosis not present

## 2024-05-03 DIAGNOSIS — G43109 Migraine with aura, not intractable, without status migrainosus: Secondary | ICD-10-CM | POA: Diagnosis not present

## 2024-05-03 DIAGNOSIS — M797 Fibromyalgia: Secondary | ICD-10-CM | POA: Diagnosis not present

## 2024-05-08 DIAGNOSIS — G518 Other disorders of facial nerve: Secondary | ICD-10-CM | POA: Diagnosis not present

## 2024-05-08 DIAGNOSIS — M791 Myalgia, unspecified site: Secondary | ICD-10-CM | POA: Diagnosis not present

## 2024-05-08 DIAGNOSIS — G43719 Chronic migraine without aura, intractable, without status migrainosus: Secondary | ICD-10-CM | POA: Diagnosis not present

## 2024-05-08 DIAGNOSIS — M542 Cervicalgia: Secondary | ICD-10-CM | POA: Diagnosis not present

## 2024-05-11 DIAGNOSIS — F4312 Post-traumatic stress disorder, chronic: Secondary | ICD-10-CM | POA: Diagnosis not present

## 2024-05-25 DIAGNOSIS — F331 Major depressive disorder, recurrent, moderate: Secondary | ICD-10-CM | POA: Diagnosis not present

## 2024-05-25 DIAGNOSIS — F4312 Post-traumatic stress disorder, chronic: Secondary | ICD-10-CM | POA: Diagnosis not present

## 2024-05-25 DIAGNOSIS — F603 Borderline personality disorder: Secondary | ICD-10-CM | POA: Diagnosis not present

## 2024-05-31 DIAGNOSIS — F331 Major depressive disorder, recurrent, moderate: Secondary | ICD-10-CM | POA: Diagnosis not present

## 2024-05-31 DIAGNOSIS — Z79899 Other long term (current) drug therapy: Secondary | ICD-10-CM | POA: Diagnosis not present

## 2024-05-31 DIAGNOSIS — F603 Borderline personality disorder: Secondary | ICD-10-CM | POA: Diagnosis not present

## 2024-05-31 DIAGNOSIS — Z5181 Encounter for therapeutic drug level monitoring: Secondary | ICD-10-CM | POA: Diagnosis not present

## 2024-05-31 DIAGNOSIS — F4312 Post-traumatic stress disorder, chronic: Secondary | ICD-10-CM | POA: Diagnosis not present

## 2024-06-01 DIAGNOSIS — F603 Borderline personality disorder: Secondary | ICD-10-CM | POA: Diagnosis not present

## 2024-06-01 DIAGNOSIS — F331 Major depressive disorder, recurrent, moderate: Secondary | ICD-10-CM | POA: Diagnosis not present

## 2024-06-01 DIAGNOSIS — F4312 Post-traumatic stress disorder, chronic: Secondary | ICD-10-CM | POA: Diagnosis not present

## 2024-06-05 DIAGNOSIS — G43719 Chronic migraine without aura, intractable, without status migrainosus: Secondary | ICD-10-CM | POA: Diagnosis not present

## 2024-06-05 DIAGNOSIS — G518 Other disorders of facial nerve: Secondary | ICD-10-CM | POA: Diagnosis not present

## 2024-06-05 DIAGNOSIS — M791 Myalgia, unspecified site: Secondary | ICD-10-CM | POA: Diagnosis not present

## 2024-06-05 DIAGNOSIS — M542 Cervicalgia: Secondary | ICD-10-CM | POA: Diagnosis not present

## 2024-06-08 DIAGNOSIS — F4312 Post-traumatic stress disorder, chronic: Secondary | ICD-10-CM | POA: Diagnosis not present

## 2024-06-08 DIAGNOSIS — F331 Major depressive disorder, recurrent, moderate: Secondary | ICD-10-CM | POA: Diagnosis not present

## 2024-06-08 DIAGNOSIS — F603 Borderline personality disorder: Secondary | ICD-10-CM | POA: Diagnosis not present

## 2024-06-18 DIAGNOSIS — M5416 Radiculopathy, lumbar region: Secondary | ICD-10-CM | POA: Diagnosis not present

## 2024-06-18 DIAGNOSIS — M7918 Myalgia, other site: Secondary | ICD-10-CM | POA: Diagnosis not present

## 2024-06-18 DIAGNOSIS — M533 Sacrococcygeal disorders, not elsewhere classified: Secondary | ICD-10-CM | POA: Diagnosis not present

## 2024-06-18 DIAGNOSIS — M47812 Spondylosis without myelopathy or radiculopathy, cervical region: Secondary | ICD-10-CM | POA: Diagnosis not present

## 2024-06-22 DIAGNOSIS — F603 Borderline personality disorder: Secondary | ICD-10-CM | POA: Diagnosis not present

## 2024-06-22 DIAGNOSIS — F331 Major depressive disorder, recurrent, moderate: Secondary | ICD-10-CM | POA: Diagnosis not present

## 2024-06-22 DIAGNOSIS — F4312 Post-traumatic stress disorder, chronic: Secondary | ICD-10-CM | POA: Diagnosis not present

## 2024-06-22 DIAGNOSIS — M7918 Myalgia, other site: Secondary | ICD-10-CM | POA: Diagnosis not present

## 2024-06-27 DIAGNOSIS — G518 Other disorders of facial nerve: Secondary | ICD-10-CM | POA: Diagnosis not present

## 2024-06-27 DIAGNOSIS — M791 Myalgia, unspecified site: Secondary | ICD-10-CM | POA: Diagnosis not present

## 2024-06-27 DIAGNOSIS — M542 Cervicalgia: Secondary | ICD-10-CM | POA: Diagnosis not present

## 2024-06-27 DIAGNOSIS — G43719 Chronic migraine without aura, intractable, without status migrainosus: Secondary | ICD-10-CM | POA: Diagnosis not present

## 2024-06-29 DIAGNOSIS — F4312 Post-traumatic stress disorder, chronic: Secondary | ICD-10-CM | POA: Diagnosis not present

## 2024-06-29 DIAGNOSIS — F331 Major depressive disorder, recurrent, moderate: Secondary | ICD-10-CM | POA: Diagnosis not present

## 2024-06-29 DIAGNOSIS — F603 Borderline personality disorder: Secondary | ICD-10-CM | POA: Diagnosis not present

## 2024-07-03 ENCOUNTER — Other Ambulatory Visit (HOSPITAL_COMMUNITY): Payer: Self-pay | Admitting: Adult Health

## 2024-07-03 DIAGNOSIS — Z1231 Encounter for screening mammogram for malignant neoplasm of breast: Secondary | ICD-10-CM

## 2024-07-04 DIAGNOSIS — M5416 Radiculopathy, lumbar region: Secondary | ICD-10-CM | POA: Diagnosis not present

## 2024-07-05 DIAGNOSIS — F4312 Post-traumatic stress disorder, chronic: Secondary | ICD-10-CM | POA: Diagnosis not present

## 2024-07-05 DIAGNOSIS — F331 Major depressive disorder, recurrent, moderate: Secondary | ICD-10-CM | POA: Diagnosis not present

## 2024-07-06 DIAGNOSIS — F331 Major depressive disorder, recurrent, moderate: Secondary | ICD-10-CM | POA: Diagnosis not present

## 2024-07-06 DIAGNOSIS — F603 Borderline personality disorder: Secondary | ICD-10-CM | POA: Diagnosis not present

## 2024-07-06 DIAGNOSIS — F4312 Post-traumatic stress disorder, chronic: Secondary | ICD-10-CM | POA: Diagnosis not present

## 2024-07-11 ENCOUNTER — Ambulatory Visit (HOSPITAL_COMMUNITY)
Admission: RE | Admit: 2024-07-11 | Discharge: 2024-07-11 | Disposition: A | Discharge status: Home / Self Care | Source: Ambulatory Visit | Attending: Adult Health | Admitting: Adult Health

## 2024-07-11 DIAGNOSIS — Z1231 Encounter for screening mammogram for malignant neoplasm of breast: Secondary | ICD-10-CM | POA: Diagnosis not present

## 2024-07-12 DIAGNOSIS — F603 Borderline personality disorder: Secondary | ICD-10-CM | POA: Diagnosis not present

## 2024-07-12 DIAGNOSIS — F331 Major depressive disorder, recurrent, moderate: Secondary | ICD-10-CM | POA: Diagnosis not present

## 2024-07-12 DIAGNOSIS — F4312 Post-traumatic stress disorder, chronic: Secondary | ICD-10-CM | POA: Diagnosis not present

## 2024-07-16 ENCOUNTER — Ambulatory Visit: Payer: Self-pay | Admitting: Adult Health

## 2024-07-20 DIAGNOSIS — F4312 Post-traumatic stress disorder, chronic: Secondary | ICD-10-CM | POA: Diagnosis not present

## 2024-07-20 DIAGNOSIS — F331 Major depressive disorder, recurrent, moderate: Secondary | ICD-10-CM | POA: Diagnosis not present

## 2024-07-20 DIAGNOSIS — F603 Borderline personality disorder: Secondary | ICD-10-CM | POA: Diagnosis not present

## 2024-07-23 DIAGNOSIS — M791 Myalgia, unspecified site: Secondary | ICD-10-CM | POA: Diagnosis not present

## 2024-07-23 DIAGNOSIS — G518 Other disorders of facial nerve: Secondary | ICD-10-CM | POA: Diagnosis not present

## 2024-07-23 DIAGNOSIS — G43719 Chronic migraine without aura, intractable, without status migrainosus: Secondary | ICD-10-CM | POA: Diagnosis not present

## 2024-07-23 DIAGNOSIS — M542 Cervicalgia: Secondary | ICD-10-CM | POA: Diagnosis not present

## 2024-07-25 DIAGNOSIS — H6691 Otitis media, unspecified, right ear: Secondary | ICD-10-CM | POA: Diagnosis not present

## 2024-07-25 DIAGNOSIS — R051 Acute cough: Secondary | ICD-10-CM | POA: Diagnosis not present

## 2024-07-25 DIAGNOSIS — L239 Allergic contact dermatitis, unspecified cause: Secondary | ICD-10-CM | POA: Diagnosis not present

## 2024-07-25 DIAGNOSIS — H7291 Unspecified perforation of tympanic membrane, right ear: Secondary | ICD-10-CM | POA: Diagnosis not present

## 2024-07-27 DIAGNOSIS — F331 Major depressive disorder, recurrent, moderate: Secondary | ICD-10-CM | POA: Diagnosis not present

## 2024-07-27 DIAGNOSIS — F4312 Post-traumatic stress disorder, chronic: Secondary | ICD-10-CM | POA: Diagnosis not present

## 2024-07-27 DIAGNOSIS — F603 Borderline personality disorder: Secondary | ICD-10-CM | POA: Diagnosis not present

## 2024-07-30 DIAGNOSIS — J019 Acute sinusitis, unspecified: Secondary | ICD-10-CM | POA: Diagnosis not present

## 2024-08-03 DIAGNOSIS — F603 Borderline personality disorder: Secondary | ICD-10-CM | POA: Diagnosis not present

## 2024-08-03 DIAGNOSIS — F4312 Post-traumatic stress disorder, chronic: Secondary | ICD-10-CM | POA: Diagnosis not present

## 2024-08-10 DIAGNOSIS — F4312 Post-traumatic stress disorder, chronic: Secondary | ICD-10-CM | POA: Diagnosis not present

## 2024-08-10 DIAGNOSIS — F331 Major depressive disorder, recurrent, moderate: Secondary | ICD-10-CM | POA: Diagnosis not present

## 2024-08-10 DIAGNOSIS — F603 Borderline personality disorder: Secondary | ICD-10-CM | POA: Diagnosis not present

## 2024-08-13 DIAGNOSIS — M5416 Radiculopathy, lumbar region: Secondary | ICD-10-CM | POA: Diagnosis not present

## 2024-08-13 DIAGNOSIS — M7918 Myalgia, other site: Secondary | ICD-10-CM | POA: Diagnosis not present

## 2024-08-14 DIAGNOSIS — M542 Cervicalgia: Secondary | ICD-10-CM | POA: Diagnosis not present

## 2024-08-14 DIAGNOSIS — G43719 Chronic migraine without aura, intractable, without status migrainosus: Secondary | ICD-10-CM | POA: Diagnosis not present

## 2024-08-14 DIAGNOSIS — G518 Other disorders of facial nerve: Secondary | ICD-10-CM | POA: Diagnosis not present

## 2024-08-14 DIAGNOSIS — M791 Myalgia, unspecified site: Secondary | ICD-10-CM | POA: Diagnosis not present

## 2024-08-21 ENCOUNTER — Encounter (INDEPENDENT_AMBULATORY_CARE_PROVIDER_SITE_OTHER): Payer: Self-pay

## 2024-08-28 DIAGNOSIS — F331 Major depressive disorder, recurrent, moderate: Secondary | ICD-10-CM | POA: Diagnosis not present

## 2024-08-28 DIAGNOSIS — F603 Borderline personality disorder: Secondary | ICD-10-CM | POA: Diagnosis not present

## 2024-08-28 DIAGNOSIS — F1290 Cannabis use, unspecified, uncomplicated: Secondary | ICD-10-CM | POA: Diagnosis not present

## 2024-08-28 DIAGNOSIS — F4312 Post-traumatic stress disorder, chronic: Secondary | ICD-10-CM | POA: Diagnosis not present

## 2024-08-29 DIAGNOSIS — F4312 Post-traumatic stress disorder, chronic: Secondary | ICD-10-CM | POA: Diagnosis not present

## 2024-08-29 DIAGNOSIS — F603 Borderline personality disorder: Secondary | ICD-10-CM | POA: Diagnosis not present

## 2024-08-29 DIAGNOSIS — F1290 Cannabis use, unspecified, uncomplicated: Secondary | ICD-10-CM | POA: Diagnosis not present

## 2024-08-30 ENCOUNTER — Ambulatory Visit: Admitting: Gastroenterology

## 2024-08-30 ENCOUNTER — Encounter: Payer: Self-pay | Admitting: *Deleted

## 2024-08-30 ENCOUNTER — Telehealth: Payer: Self-pay | Admitting: *Deleted

## 2024-08-30 ENCOUNTER — Other Ambulatory Visit: Payer: Self-pay | Admitting: *Deleted

## 2024-08-30 ENCOUNTER — Encounter: Payer: Self-pay | Admitting: Gastroenterology

## 2024-08-30 VITALS — BP 110/77 | HR 71 | Temp 97.9°F | Ht 62.0 in | Wt 171.8 lb

## 2024-08-30 DIAGNOSIS — Z1211 Encounter for screening for malignant neoplasm of colon: Secondary | ICD-10-CM | POA: Diagnosis not present

## 2024-08-30 DIAGNOSIS — K582 Mixed irritable bowel syndrome: Secondary | ICD-10-CM

## 2024-08-30 DIAGNOSIS — K589 Irritable bowel syndrome without diarrhea: Secondary | ICD-10-CM | POA: Insufficient documentation

## 2024-08-30 MED ORDER — PEG 3350-KCL-NA BICARB-NACL 420 G PO SOLR: 4000.0000 mL | Freq: Once | ORAL | 0 refills | Status: AC

## 2024-08-30 MED ORDER — DICYCLOMINE HCL 10 MG PO CAPS: 10.0000 mg | ORAL_CAPSULE | Freq: Three times a day (TID) | ORAL | 1 refills | Status: AC

## 2024-08-30 NOTE — Patient Instructions (Signed)
 I have sent in dicyclomine  to take up to 4 times a day for abdominal cramping with loose stool. Use this sparingly, because it can lead to constipation, dry mouth, dizziness.  On any given day no bowel movement, you can take 1 capful of Miralax.   We are arranging a colonoscopy in the near future!  I will call in the [facility] cream for you!  We can see you in 6 months   I enjoyed seeing you again today! I value our relationship and want to provide genuine, compassionate, and quality care. You may receive a survey regarding your visit with me, and I welcome your feedback! Thanks so much for taking the time to complete this. I look forward to seeing you again.      Therisa MICAEL Stager, PhD, ANP-BC [facility]

## 2024-08-30 NOTE — Progress Notes (Signed)
 Gastroenterology Office Note     Primary Care Physician:  Shona Norleen PEDLAR, MD  Primary Gastroenterologist: Dr. Shaaron    Chief Complaint   Chief Complaint  Patient presents with   Colonoscopy    Was having some lower abd pain, has since resolved. Is due for screening colonoscopy and is requesting refills on [facility] hemorrhoid cream.     History of Present Illness   Mary Parker is a 43 y.o. female presenting today with a history of alternating constipation and diarrhea, hemorrhoids s/p banding several years ago, chronic LLQ abdominal pain,  and GERD, presenting for annual follow-up and to arrange routine screening colonoscopy.   Colonoscopy in 2018 was performed due to rectal bleeding and found to have internal hemorrhoids. Here for screening at age 7.   Was having LLQ abdominal pain and difficulty with bowel habits about a month ago. This has now resolved. Has BMs all over the [facility] chart. Never knows how it will be. Can have all of it in one day. Will start off with rocks and end with looser stool.   Skips days between BMs. Next time goes may be runny, may be harder. Will have days of just loose stools. Celiac serologies negative. Feels like she did well with dicyclomine  in the past. Declining any further evaluation for bowel habits such as inflammatory markers, alpha gal, etc.   Needs refill on [facility] cream. Had rectal bleeding last night and states had a hemorrhoid explosion.   No FH IBD No FH colon cancer or polyps that she is aware   Colonoscopy 2018: normal colon, internal hemorrhoids,   EGD 2002: normal at [facility]   Past Medical History:  Diagnosis Date   Anxiety    Arthritis    both knees   Bell's palsy    migraine induced   Body aches 12/11/2014   Chronic insomnia    Chronic low back pain 05/29/2015   Common migraine 12/19/2014   migraine induced stroke   Depression 12/11/2014   Fibrocystic disease of both breasts     Fibromyalgia 12/19/2014   GERD (gastroesophageal reflux disease)    History of hiatal hernia    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/09/2021   11/09/21 needs colposcopy   IBS (irritable bowel syndrome)    Knee pain, right    Migraines    Rebound headache 12/19/2014   Right ovarian cyst 04/25/2017   Complex, check CA 125 and repeat US  in 6 weeks    Sciatica of left side    Seizures (HCC)    remote past, felt it was related to Chantix     Sleep apnea    Stroke [facility])    migraine induced stroke   Vitamin D deficiency    Wears glasses     Past Surgical History:  Procedure Laterality Date   BILATERAL SALPINGECTOMY Bilateral 06/21/2017   Procedure: BILATERAL SALPINGECTOMY;  Surgeon: Edsel Norleen GAILS, MD;  Location: [facility];  Service: Gynecology;  Laterality: Bilateral;   CESAREAN SECTION  [phone]   CHOLECYSTECTOMY  2001   COLONOSCOPY WITH PROPOFOL  N/A 04/07/2017   Dr. Thersia: Normal, abnormal CT likely artifactual   ENDOMETRIAL ABLATION     ESOPHAGOGASTRODUODENOSCOPY  2002   [facility]: normal   KNEE ARTHROSCOPY WITH LATERAL RELEASE Right 04/18/2018   Procedure: Right knee arthroscopy with chondroplasty and lateral release;  Surgeon: Sharl Selinda Dover, MD;  Location: [facility];  Service:  Orthopedics;  Laterality: Right;  60 mins   LASER ABLATION CONDOLAMATA N/A 12/30/2021   Procedure: LASER ABLATION of the cervix;  Surgeon: Jayne Vonn DEL, MD;  Location: [facility];  Service: Gynecology;  Laterality: N/A;   MOUTH BIOPSY     MOUTH SURGERY     MULTIPLE TOOTH EXTRACTIONS     03/24/2023   SUPRACERVICAL ABDOMINAL HYSTERECTOMY N/A 06/21/2017   Procedure: HYSTERECTOMY SUPRACERVICAL ABDOMINAL;  Surgeon: Edsel Norleen GAILS, MD;  Location: [facility];  Service: Gynecology;  Laterality: N/A;   TUBAL LIGATION      Current Outpatient Medications  Medication Sig Dispense Refill   ALPRAZolam  (XANAX ) 1 MG tablet Take 1 mg by mouth 2 (two)  times daily.     budesonide-formoterol (SYMBICORT) 160-4.5 MCG/ACT inhaler Inhale 2 puffs into the lungs 2 (two) times daily as needed (for flares).     cyanocobalamin  (,VITAMIN B-12,) 1000 MCG/ML injection Inject 1,000 mcg into the skin every 30 (thirty) days.   0   dicyclomine  (BENTYL ) 10 MG capsule Take 1 capsule (10 mg total) by mouth 4 (four) times daily -  before meals and at bedtime. For looser stool and cramping. Stop if constipation, as this can cause worsening constipation. 120 capsule 1   hydrOXYzine (ATARAX) 50 MG tablet Take 50 mg by mouth at bedtime.     methocarbamol  (ROBAXIN ) 750 MG tablet Take 750 mg by mouth at bedtime.     omeprazole (PRILOSEC) 20 MG capsule Take 20 mg by mouth daily.     ondansetron  (ZOFRAN -ODT) 4 MG disintegrating tablet Take 1 tablet (4 mg total) by mouth every 8 (eight) hours as needed for nausea or vomiting. 20 tablet 0   sertraline  (ZOLOFT ) 100 MG tablet Take 100 mg by mouth daily.     Ubrogepant  (UBRELVY ) 100 MG TABS Take 1 tablet (100 mg total) by mouth as needed (take 1 tablet at onset of headache, may repeat 2 hours after initial dose, max is 200 mg/24 hours). 12 tablet 11   No current facility-administered medications for this visit.    Allergies as of 08/30/2024 - Review Complete 08/30/2024  Allergen Reaction Noted   Bupropion Other (See Comments) 05/07/2015   Duloxetine  hcl Hives, Itching, and Rash 04/04/2017   Gabapentin  Itching 03/03/2017   Meloxicam  Other (See Comments) 05/28/2016   Pregabalin  Other (See Comments) 04/04/2017   Amitriptyline Other (See Comments) 10/23/2018   Ciprofloxacin Hives, Swelling, and Other (See Comments) 12/11/2014   Desvenlafaxine succinate er Other (See Comments) 11/09/2017   Hydrocodone -acetaminophen  Rash 04/18/2018   Milnacipran  Nausea Only and Other (See Comments) 06/15/2017   Nortriptyline  Nausea And Vomiting and Other (See Comments) 04/01/2015   Oxycodone  Other (See Comments) 01/16/2021   Topiramate  Swelling and Other (See Comments) 04/01/2015   Decadron  [dexamethasone ] Other (See Comments) 03/05/2019   Hydrocodone   01/16/2021   Triptans Other (See Comments) 05/13/2023   Doxycycline  Nausea Only 06/29/2017   Percocet [oxycodone -acetaminophen ] Nausea Only 06/29/2017   Sumatriptan Rash 05/13/2023    Family History  Problem Relation Age of Onset   Breast cancer Mother    Hypertension Mother    Other Mother        abnormal cells; had hyst   Diabetes Father    Hypertension Father    Alcohol abuse Father    Obesity Daughter    Diabetes Daughter 70       youngest   Obesity Daughter    Cancer Maternal Grandmother    Osteoporosis Maternal Grandmother    Cancer Maternal Grandfather  Obesity Son    Colon cancer Neg Hx     Social History   Socioeconomic History   Marital status: Divorced    Spouse name: Not on file   Number of children: 3   Years of education: HS   Highest education level: Not on file  Occupational History   Occupation: [employer]  Tobacco Use   Smoking status: Former    Current packs/day: 0.00    Average packs/day: 0.3 packs/day for 25.0 years (6.3 ttl pk-yrs)    Types: Cigarettes    Start date: 04/01/1994    Quit date: 04/02/2019    Years since quitting: 5.4   Smokeless tobacco: Never  Vaping Use   Vaping status: Never Used  Substance and Sexual Activity   Alcohol use: No   Drug use: Yes    Types: Marijuana   Sexual activity: Not Currently    Birth control/protection: Surgical    Comment: supracervical hyst and tubal  Other Topics Concern   Not on file  Social History Narrative   Patient is right handed.   Patient drinks 1-2 cups of caffeine  dailyy.   Patient lives mom stepdad, and children.   Social Drivers of Health   Financial Resource Strain: Medium Risk (05/03/2024)   Received from [facility]   Overall Financial Resource Strain (CARDIA)    Difficulty of Paying Living Expenses: Somewhat hard  Food Insecurity: Food Insecurity  Present (05/03/2024)   Received from [facility]   Hunger Vital Sign    Within the past 12 months, you worried that your food would run out before you got the money to buy more.: Never true    Within the past 12 months, the food you bought just didn't last and you didn't have money to get more.: Sometimes true  Transportation Needs: No Transportation Needs (05/03/2024)   Received from [facility] - Transportation    Lack of Transportation (Medical): No    Lack of Transportation (Non-Medical): No  Physical Activity: Insufficiently Active (05/03/2024)   Received from [facility]   Exercise Vital Sign    On average, how many days per week do you engage in moderate to strenuous exercise (like a brisk walk)?: 4 days    On average, how many minutes do you engage in exercise at this level?: 20 min  Stress: Stress Concern Present (05/03/2024)   Received from [facility] - Occupational Stress Questionnaire    Feeling of Stress : Very much  Social Connections: Somewhat Isolated (05/03/2024)   Received from [facility]   Social Network    How would you rate your social network (family, work, friends)?: Restricted participation with some degree of social isolation  Intimate Partner Violence: Patient Declined (05/03/2024)   Received from [facility]   HITS    Over the last 12 months how often did your partner physically hurt you?: Patient declined    Over the last 12 months how often did your partner insult you or talk down to you?: Patient declined    Over the last 12 months how often did your partner threaten you with physical harm?: Patient declined    Over the last 12 months how often did your partner scream or curse at you?: Patient declined     Review of Systems   Gen: Denies any fever, chills, fatigue, weight loss, lack of appetite.  CV: Denies chest pain, heart palpitations, peripheral edema, syncope.  Resp: Denies  shortness of  breath at rest or with exertion. Denies wheezing or cough.  GI: Denies dysphagia or odynophagia. Denies jaundice, hematemesis, fecal incontinence. GU : Denies urinary burning, urinary frequency, urinary hesitancy MS: Denies joint pain, muscle weakness, cramps, or limitation of movement.  Derm: Denies rash, itching, dry skin Psych: Denies depression, anxiety, memory loss, and confusion Heme: Denies bruising, bleeding, and enlarged lymph nodes.   Physical Exam   BP 110/77 (BP Location: Right Arm, Patient Position: Sitting, Cuff Size: Normal)   Pulse 71   Temp 97.9 F (36.6 C) (Oral)   Ht 5' 2 (1.575 m)   Wt 171 lb 12.8 oz (77.9 kg)   SpO2 97%   BMI 31.42 kg/m  General:   Alert and oriented. Pleasant and cooperative. Well-nourished and well-developed.  Head:  Normocephalic and atraumatic. Eyes:  Without icterus Abdomen:  +BS, soft, non-tender and non-distended. No HSM noted. No guarding or rebound. No masses appreciated.  Rectal:  no mass on DRE, small external, non-thrombosed hemorrhoids Msk:  Symmetrical without gross deformities. Normal posture. Extremities:  Without edema. Neurologic:  Alert and  oriented x4;  grossly normal neurologically. Skin:  Intact without significant lesions or rashes. Psych:  Alert and cooperative. Normal mood and affect.   Assessment   Mary Parker is a 43 y.o. female presenting today with a history of  alternating constipation and diarrhea, hemorrhoids s/p banding several years ago, chronic LLQ abdominal pain intermittently,  and GERD, presenting for annual follow-up and to arrange routine screening colonoscopy.   Colonoscopy in 2018 was performed due to rectal bleeding and found to have internal hemorrhoids. Here for screening at age 48.   Alternating bowel habits with what appears to be a mixed pattern IBS; she has difficulty determining what is predominant but notes dicyclomine  was helpful with evening out her bowel habits in the past, so we  will resume this but monitor for any constipation.   Recent rectal bleeding likely due to known internal hemorrhoids. Prior banding several years ago. Will refill apothecary cream.    PLAN    Called in [facility] hemorrhoid cream, disp# 1 tube with 1 refill, use BID Proceed with screening colonoscopy by Dr. Shaaron in near future: the risks, benefits, and alternatives have been discussed with the patient in detail. The patient states understanding and desires to proceed. Dicyclomine  prn, if any given day with constipation, take Miralax prn 6 month follow-up   Therisa MICAEL Stager, PhD, ANP-BC [facility]

## 2024-08-30 NOTE — Telephone Encounter (Signed)
 [employer] PA for TCS: The prior authorization/notification reference number is: [id number]. In review

## 2024-09-03 NOTE — Telephone Encounter (Signed)
 [employer] PA: Approved, ref# [id number] DOS: 09/13/24-12/12/24

## 2024-09-09 ENCOUNTER — Other Ambulatory Visit: Payer: Self-pay | Admitting: Medical Genetics

## 2024-09-10 ENCOUNTER — Other Ambulatory Visit: Payer: Self-pay | Admitting: Medical Genetics

## 2024-09-13 ENCOUNTER — Ambulatory Visit (HOSPITAL_COMMUNITY): Admitting: Anesthesiology

## 2024-09-13 ENCOUNTER — Other Ambulatory Visit: Payer: Self-pay

## 2024-09-13 ENCOUNTER — Ambulatory Visit (HOSPITAL_COMMUNITY)
Admission: RE | Admit: 2024-09-13 | Discharge: 2024-09-13 | Disposition: A | Source: Ambulatory Visit | Attending: Internal Medicine | Admitting: Internal Medicine

## 2024-09-13 ENCOUNTER — Ambulatory Visit (HOSPITAL_COMMUNITY)
Admission: RE | Admit: 2024-09-13 | Discharge: 2024-09-13 | Disposition: A | Discharge status: Home / Self Care | Attending: Internal Medicine | Admitting: Internal Medicine

## 2024-09-13 ENCOUNTER — Encounter (HOSPITAL_COMMUNITY)
Admission: RE | Disposition: A | Discharge status: Home / Self Care | Payer: Self-pay | Source: Home / Self Care | Attending: Internal Medicine

## 2024-09-13 ENCOUNTER — Encounter (HOSPITAL_COMMUNITY): Payer: Self-pay | Admitting: Internal Medicine

## 2024-09-13 DIAGNOSIS — F419 Anxiety disorder, unspecified: Secondary | ICD-10-CM | POA: Diagnosis not present

## 2024-09-13 DIAGNOSIS — K219 Gastro-esophageal reflux disease without esophagitis: Secondary | ICD-10-CM | POA: Insufficient documentation

## 2024-09-13 DIAGNOSIS — F32A Depression, unspecified: Secondary | ICD-10-CM | POA: Diagnosis not present

## 2024-09-13 DIAGNOSIS — Z1211 Encounter for screening for malignant neoplasm of colon: Secondary | ICD-10-CM | POA: Insufficient documentation

## 2024-09-13 DIAGNOSIS — K641 Second degree hemorrhoids: Secondary | ICD-10-CM | POA: Insufficient documentation

## 2024-09-13 DIAGNOSIS — Z7951 Long term (current) use of inhaled steroids: Secondary | ICD-10-CM | POA: Insufficient documentation

## 2024-09-13 DIAGNOSIS — K449 Diaphragmatic hernia without obstruction or gangrene: Secondary | ICD-10-CM | POA: Diagnosis not present

## 2024-09-13 DIAGNOSIS — K5730 Diverticulosis of large intestine without perforation or abscess without bleeding: Secondary | ICD-10-CM | POA: Diagnosis not present

## 2024-09-13 DIAGNOSIS — Z87891 Personal history of nicotine dependence: Secondary | ICD-10-CM | POA: Diagnosis not present

## 2024-09-13 DIAGNOSIS — R519 Headache, unspecified: Secondary | ICD-10-CM | POA: Diagnosis not present

## 2024-09-13 DIAGNOSIS — G4730 Sleep apnea, unspecified: Secondary | ICD-10-CM | POA: Diagnosis not present

## 2024-09-13 DIAGNOSIS — I10 Essential (primary) hypertension: Secondary | ICD-10-CM | POA: Insufficient documentation

## 2024-09-13 DIAGNOSIS — Z79899 Other long term (current) drug therapy: Secondary | ICD-10-CM | POA: Diagnosis not present

## 2024-09-13 DIAGNOSIS — R569 Unspecified convulsions: Secondary | ICD-10-CM | POA: Insufficient documentation

## 2024-09-13 HISTORY — PX: COLONOSCOPY: SHX5424

## 2024-09-13 SURGERY — COLONOSCOPY
Anesthesia: Monitor Anesthesia Care

## 2024-09-13 MED ORDER — LACTATED RINGERS IV SOLN: INTRAVENOUS | Status: DC | PRN

## 2024-09-13 MED ORDER — PROPOFOL 500 MG/50ML IV EMUL
INTRAVENOUS | Status: DC | PRN
  Administered 2024-09-13: 150 ug/kg/min via INTRAVENOUS
  Administered 2024-09-13: 170 mg via INTRAVENOUS

## 2024-09-13 MED ORDER — STERILE WATER FOR IRRIGATION IR SOLN
Status: DC | PRN
  Administered 2024-09-13: 60 mL

## 2024-09-13 NOTE — Transfer of Care (Signed)
 Immediate Anesthesia Transfer of Care Note  Patient: Mary Parker  Procedure(s) Performed: COLONOSCOPY  Patient Location: Endoscopy Unit  Anesthesia Type:MAC  Level of Consciousness: awake and patient cooperative  Airway & Oxygen  Therapy: Patient Spontanous Breathing  Post-op Assessment: Report given to RN and Post -op Vital signs reviewed and stable  Post vital signs: Reviewed and stable  Last Vitals:  Vitals Value Taken Time  BP 88/57 09/13/24 08:44  Temp 36.5 C 09/13/24 08:44  Pulse 54 09/13/24 08:44  Resp 11 09/13/24 08:44  SpO2 99 % 09/13/24 08:44    Last Pain:  Vitals:   09/13/24 0844  TempSrc: Oral  PainSc: 0-No pain      Patients Stated Pain Goal: 6 (09/13/24 0714)  Complications: No notable events documented.

## 2024-09-13 NOTE — Anesthesia Procedure Notes (Signed)
 Date/Time: 09/13/2024 8:19 AM  Performed by: Barbarann Verneita RAMAN, CRNAPre-anesthesia Checklist: Patient identified, Emergency Drugs available, Suction available, Timeout performed and Patient being monitored Patient Re-evaluated:Patient Re-evaluated prior to induction Oxygen  Delivery Method: Nasal Cannula

## 2024-09-13 NOTE — Discharge Instructions (Signed)
  Colonoscopy Discharge Instructions  Read the instructions outlined below and refer to this sheet in the next few weeks. These discharge instructions provide you with general information on caring for yourself after you leave the hospital. Your doctor may also give you specific instructions. While your treatment has been planned according to the most current medical practices available, unavoidable complications occasionally occur. If you have any problems or questions after discharge, call Dr. Shaaron at [phone]. ACTIVITY You may resume your regular activity, but move at a slower pace for the next 24 hours.  Take frequent rest periods for the next 24 hours.  Walking will help get rid of the air and reduce the bloated feeling in your belly (abdomen).  No driving for 24 hours (because of the medicine (anesthesia) used during the test).   Do not sign any important legal documents or operate any machinery for 24 hours (because of the anesthesia used during the test).  NUTRITION Drink plenty of fluids.  You may resume your normal diet as instructed by your doctor.  Begin with a light meal and progress to your normal diet. Heavy or fried foods are harder to digest and may make you feel sick to your stomach (nauseated).  Avoid alcoholic beverages for 24 hours or as instructed.  MEDICATIONS You may resume your normal medications unless your doctor tells you otherwise.  WHAT YOU CAN EXPECT TODAY Some feelings of bloating in the abdomen.  Passage of more gas than usual.  Spotting of blood in your stool or on the toilet paper.  IF YOU HAD POLYPS REMOVED DURING THE COLONOSCOPY: No aspirin products for 7 days or as instructed.  No alcohol for 7 days or as instructed.  Eat a soft diet for the next 24 hours.  FINDING OUT THE RESULTS OF YOUR TEST Not all test results are available during your visit. If your test results are not back during the visit, make an appointment with your caregiver to find out the  results. Do not assume everything is normal if you have not heard from your caregiver or the medical facility. It is important for you to follow up on all of your test results.  SEEK IMMEDIATE MEDICAL ATTENTION IF: You have more than a spotting of blood in your stool.  Your belly is swollen (abdominal distention).  You are nauseated or vomiting.  You have a temperature over 101.  You have abdominal pain or discomfort that is severe or gets worse throughout the day.    Minimal diverticulosis  You do have persisting hemorrhoids  No polyps found today  It is recommended you return for repeat colonoscopy in 10 years  Pamphlet on hemorrhoid banding provided  Office visit with Therisa Stager for possible hemorrhoid banding in 8 weeks.

## 2024-09-13 NOTE — Op Note (Signed)
 [facility] Patient Name: Mary Parker Procedure Date: 09/13/2024 8:03 AM MRN: [id number] Date of Birth: 06/30/1981 Attending MD: Lamar Ozell Hollingshead , MD, [id number] CSN: [id number] Age: 43 Admit Type: Outpatient Procedure:                Colonoscopy Indications:              Screening for colorectal malignant neoplasm Providers:                Lamar Ozell Hollingshead, MD, Crystal Page, Madelin Hunter, RN Referring MD:              Medicines:                Propofol  per Anesthesia Complications:            No immediate complications. Estimated Blood Loss:     Estimated blood loss: none. Procedure:                Pre-Anesthesia Assessment:                           - Prior to the procedure, a History and Physical                            was performed, and patient medications and                            allergies were reviewed. The patient's tolerance of                            previous anesthesia was also reviewed. The risks                            and benefits of the procedure and the sedation                            options and risks were discussed with the patient.                            All questions were answered, and informed consent                            was obtained. Prior Anticoagulants: The patient has                            taken no anticoagulant or antiplatelet agents. ASA                            Grade Assessment: III - A patient with severe                            systemic disease. After reviewing the risks and  benefits, the patient was deemed in satisfactory                            condition to undergo the procedure.                           After obtaining informed consent, the colonoscope                            was passed under direct vision. Throughout the                            procedure, the patient's blood pressure, pulse, and                            oxygen   saturations were monitored continuously. The                            [id number] ([id number]) Colon was introduced through                            the anus and advanced to the the cecum, identified                            by appendiceal orifice and ileocecal valve. The                            colonoscopy was performed without difficulty. The                            patient tolerated the procedure well. The quality                            of the bowel preparation was adequate. The                            ileocecal valve, appendiceal orifice, and rectum                            were photographed. Scope In: 8:26:35 AM Scope Out: 8:39:46 AM Scope Withdrawal Time: 0 hours 7 minutes 41 seconds  Total Procedure Duration: 0 hours 13 minutes 11 seconds  Findings:      The perianal and digital rectal examinations were normal.      Non-bleeding internal hemorrhoids were found during retroflexion. The       hemorrhoids were moderate, medium-sized and Grade II (internal       hemorrhoids that prolapse but reduce spontaneously).      A few small-mouthed diverticula were found in the entire colon.      The exam was otherwise without abnormality on direct and retroflexion       views. Impression:               - Non-bleeding internal hemorrhoids.                           -  Diverticulosis in the entire examined colon.                           - The examination was otherwise normal on direct                            and retroflexion views.                           - No specimens collected. Moderate Sedation:      Moderate (conscious) sedation was personally administered by an       anesthesia professional. The following parameters were monitored: oxygen        saturation, heart rate, blood pressure, respiratory rate, EKG, adequacy       of pulmonary ventilation, and response to care. Recommendation:           - Patient has a contact number available for                             emergencies. The signs and symptoms of potential                            delayed complications were discussed with the                            patient. Return to normal activities tomorrow.                            Written discharge instructions were provided to the                            patient.                           - Advance diet as tolerated. Hemorrhoid banding                            pamphlet provided.                           - Repeat colonoscopy in 10 years for screening                            purposes.                           - Return to GI office in 2 months. Small hemorrhoid                            banding at that time. Procedure Code(s):        --- Professional ---                           [phone], Colonoscopy, flexible; diagnostic, including  collection of specimen(s) by brushing or washing,                            when performed (separate procedure) Diagnosis Code(s):        --- Professional ---                           Z12.11, Encounter for screening for malignant                            neoplasm of colon                           K64.1, Second degree hemorrhoids                           K57.30, Diverticulosis of large intestine without                            perforation or abscess without bleeding CPT copyright 2022 American [employer] Association. All rights reserved. The codes documented in this report are preliminary and upon coder review may  be revised to meet current compliance requirements. Lamar HERO. Gaylan Fauver, MD Lamar Ozell Hollingshead, MD 09/13/2024 8:55:21 AM This report has been signed electronically. Number of Addenda: 0

## 2024-09-13 NOTE — H&P (Signed)
 @LOGO @   Gastroenterology Progress Note    Primary Care Physician:  Shona Norleen PEDLAR, MD Primary Gastroenterologist:  Dr. Shaaron  Pre-Procedure History & Physical: HPI:  Mary Parker is a 43 y.o. female here for average risk screening colonoscopy.  Prior colonoscopy for rectal bleeding  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/2021   11/09/21 needs colposcopy   IBS (irritable bowel syndrome)    Knee pain, right    Migraines    Rebound headache 12/19/2014   Right ovarian cyst 04/25/2017   Complex, check CA 125 and repeat US  in 6 weeks    Sciatica of left side    Seizures (HCC)    remote past, felt it was related to Chantix     Sleep apnea    Stroke [facility])    migraine induced stroke   Vitamin D deficiency    Wears glasses     Past Surgical History:  Procedure Laterality Date   BILATERAL SALPINGECTOMY Bilateral 06/21/2017   Procedure: BILATERAL SALPINGECTOMY;  Surgeon: Edsel Norleen GAILS, MD;  Location: [facility];  Service: Gynecology;  Laterality: Bilateral;   CESAREAN SECTION  [phone]   CHOLECYSTECTOMY  2001   COLONOSCOPY WITH PROPOFOL  N/A 04/07/2017   Dr. Thersia: Normal, abnormal CT likely artifactual   ENDOMETRIAL ABLATION     ESOPHAGOGASTRODUODENOSCOPY  2002   [facility]: normal   KNEE ARTHROSCOPY WITH LATERAL RELEASE Right 04/18/2018   Procedure: Right knee arthroscopy with chondroplasty and lateral release;  Surgeon: Sharl Selinda Dover, MD;  Location: [facility];  Service: Orthopedics;  Laterality: Right;   60 mins   LASER ABLATION CONDOLAMATA N/A 12/30/2021   Procedure: LASER ABLATION of the cervix;  Surgeon: Jayne Vonn DEL, MD;  Location: [facility];  Service: Gynecology;  Laterality: N/A;   MOUTH BIOPSY     MOUTH SURGERY     MULTIPLE TOOTH EXTRACTIONS     03/24/2023   SUPRACERVICAL ABDOMINAL HYSTERECTOMY N/A 06/21/2017   Procedure: HYSTERECTOMY SUPRACERVICAL ABDOMINAL;  Surgeon: Edsel Norleen GAILS, MD;  Location: [facility];  Service: Gynecology;  Laterality: N/A;   TUBAL LIGATION      Prior to Admission medications   Medication Sig Start Date End Date Taking? Authorizing Provider  ALPRAZolam  (XANAX ) 1 MG tablet Take 1 mg by mouth 2 (two) times daily. 09/21/21  Yes [provider]  budesonide-formoterol (SYMBICORT) 160-4.5 MCG/ACT inhaler Inhale 2 puffs into the lungs 2 (two) times daily as needed (for flares).   Yes [provider]  cyanocobalamin  (,VITAMIN B-12,) 1000 MCG/ML injection Inject 1,000 mcg into the skin every 30 (thirty) days.  02/17/18  Yes [provider]  dicyclomine  (BENTYL ) 10 MG capsule Take 1 capsule (10 mg total) by mouth 4 (four) times daily -  before meals and at bedtime. For looser stool and cramping. Stop if constipation, as this can cause worsening constipation. 08/30/24  Yes Shirlean Therisa ORN, NP  hydrOXYzine (ATARAX) 50 MG tablet Take 50 mg by mouth at  bedtime.   Yes [provider]  methocarbamol  (ROBAXIN ) 750 MG tablet Take 750 mg by mouth at bedtime.   Yes [provider]  omeprazole (PRILOSEC) 20 MG capsule Take 20 mg by mouth daily. 12/18/21  Yes [provider]  ondansetron  (ZOFRAN -ODT) 4 MG disintegrating tablet Take 1 tablet (4 mg total) by mouth every 8 (eight) hours as needed for nausea or vomiting. 12/15/23  Yes Gladis Elsie BROCKS, PA-C  sertraline  (ZOLOFT ) 100 MG tablet Take 100 mg by mouth daily.   Yes [provider]  Ubrogepant  (UBRELVY ) 100 MG TABS Take 1 tablet (100 mg total) by mouth as needed (take 1  tablet at onset of headache, may repeat 2 hours after initial dose, max is 200 mg/24 hours). 01/05/23  Yes Gayland Lauraine PARAS, NP    Allergies as of 08/30/2024 - Review Complete 08/30/2024  Allergen Reaction Noted   Bupropion Other (See Comments) 05/07/2015   Duloxetine  hcl Hives, Itching, and Rash 04/04/2017   Gabapentin  Itching 03/03/2017   Meloxicam  Other (See Comments) 05/28/2016   Pregabalin  Other (See Comments) 04/04/2017   Amitriptyline Other (See Comments) 10/23/2018   Ciprofloxacin Hives, Swelling, and Other (See Comments) 12/11/2014   Desvenlafaxine succinate er Other (See Comments) 11/09/2017   Hydrocodone -acetaminophen  Rash 04/18/2018   Milnacipran  Nausea Only and Other (See Comments) 06/15/2017   Nortriptyline  Nausea And Vomiting and Other (See Comments) 04/01/2015   Oxycodone  Other (See Comments) 01/16/2021   Topiramate Swelling and Other (See Comments) 04/01/2015   Decadron  [dexamethasone ] Other (See Comments) 03/05/2019   Hydrocodone   01/16/2021   Triptans Other (See Comments) 05/13/2023   Doxycycline  Nausea Only 06/29/2017   Percocet [oxycodone -acetaminophen ] Nausea Only 06/29/2017   Sumatriptan Rash 05/13/2023    Family History  Problem Relation Age of Onset   Breast cancer Mother    Hypertension Mother    Other Mother        abnormal cells; had hyst   Diabetes Father    Hypertension Father    Alcohol abuse Father    Obesity Daughter    Diabetes Daughter 38       youngest   Obesity Daughter    Cancer Maternal Grandmother    Osteoporosis Maternal Grandmother    Cancer Maternal Grandfather    Obesity Son    Colon cancer Neg Hx     Social History   Socioeconomic History   Marital status: Divorced    Spouse name: Not on file   Number of children: 3   Years of education: HS   Highest education level: Not on file  Occupational History   Occupation: [employer]  Tobacco Use   Smoking status: Former    Current packs/day: 0.00    Average packs/day: 0.3  packs/day for 25.0 years (6.3 ttl pk-yrs)    Types: Cigarettes    Start date: 04/01/1994    Quit date: 04/02/2019    Years since quitting: 5.4   Smokeless tobacco: Never  Vaping Use   Vaping status: Every Day  Substance and Sexual Activity   Alcohol use: No   Drug use: Yes    Types: Marijuana    Comment: last weekend   Sexual activity: Not Currently    Birth control/protection: Surgical    Comment: supracervical hyst and tubal  Other Topics Concern   Not on file  Social History Narrative   Patient is right handed.   Patient drinks 1-2 cups of caffeine  dailyy.   Patient lives mom stepdad, and children.   Social  Drivers of Health   Financial Resource Strain: Medium Risk (05/03/2024)   [facility]   Overall Financial Resource Strain (CARDIA)    Difficulty of Paying Living Expenses: Somewhat hard  Food Insecurity: Food Insecurity Present (05/03/2024)   [facility]   Hunger Vital Sign    Within the past 12 months, you worried that your food would run out before you got the money to buy more.: Never true    Within the past 12 months, the food you bought just didn't last and you didn't have money to get more.: Sometimes true  Transportation Needs: No Transportation Needs (05/03/2024)   Received from [facility] - Transportation    Lack of Transportation (Medical): No    Lack of Transportation (Non-Medical): No  Physical Activity: Insufficiently Active (05/03/2024)   [facility]   Exercise Vital Sign    On average, how many days per week do you engage in moderate to strenuous exercise (like a brisk walk)?: 4 days    On average, how many minutes do you engage in exercise at this level?: 20 min  Stress: Stress Concern Present (05/03/2024)   Received from [facility] - Occupational Stress Questionnaire    Feeling of Stress : Very much  Social Connections: Somewhat Isolated (05/03/2024)    Received from [facility]   Social Network    How would you rate your social network (family, work, friends)?: Restricted participation with some degree of social isolation  Intimate Partner Violence: Patient Declined (05/03/2024)   Received from [facility]   HITS    Over the last 12 months how often did your partner physically hurt you?: Patient declined    Over the last 12 months how often did your partner insult you or talk down to you?: Patient declined    Over the last 12 months how often did your partner threaten you with physical harm?: Patient declined    Over the last 12 months how often did your partner scream or curse at you?: Patient declined    Review of Systems   See HPI, otherwise negative ROS  Physical Exam: BP 110/75   Pulse 63   Temp 98.1 F (36.7 C) (Oral)   Resp 16   SpO2 98%  General:   Alert,  Well-developed, well-nourished, pleasant and cooperative in NAD Mouth:  No deformity or lesions. Neck:  Supple; no masses or thyromegaly. No significant cervical adenopathy. Lungs:  Clear throughout to auscultation.   No wheezes, crackles, or rhonchi. No acute distress. Heart:  Regular rate and rhythm; no murmurs, clicks, rubs,  or gallops. Abdomen: Non-distended, normal bowel sounds.  Soft and nontender without appreciable mass or hepatosplenomegaly.   Impression/Plan:   43 year old lady here for an average risk screening colonoscopy  The risks, benefits, limitations, alternatives and imponderables have been reviewed with the patient. Questions have been answered. All parties are agreeable.      Notice: This dictation was prepared with Dragon dictation along with smaller phrase technology. Any transcriptional errors that result from this process are unintentional and may not be corrected upon review.

## 2024-09-13 NOTE — Anesthesia Postprocedure Evaluation (Signed)
 Anesthesia Post Note  Patient: Mary Parker  Procedure(s) Performed: COLONOSCOPY  Patient location during evaluation: Phase II Anesthesia Type: MAC Level of consciousness: awake Pain management: pain level controlled Vital Signs Assessment: post-procedure vital signs reviewed and stable Respiratory status: spontaneous breathing and respiratory function stable Cardiovascular status: blood pressure returned to baseline and stable Postop Assessment: no headache and no apparent nausea or vomiting Anesthetic complications: no Comments: Late entry   No notable events documented.   Last Vitals:  Vitals:   09/13/24 0844 09/13/24 0849  BP: (!) 88/57 94/62  Pulse: (!) 54 (!) 49  Resp: 11 14  Temp: 36.5 C   SpO2: 99% 99%    Last Pain:  Vitals:   09/13/24 0849  TempSrc:   PainSc: 0-No pain                 Yvonna JINNY Bosworth

## 2024-09-13 NOTE — Anesthesia Preprocedure Evaluation (Signed)
 Anesthesia Evaluation  Patient identified by MRN, date of birth, ID band Patient awake    Reviewed: Allergy & Precautions, H&P , NPO status , Patient's Chart, lab work & pertinent test results, reviewed documented beta blocker date and time   Airway Mallampati: II  TM Distance: >3 FB Neck ROM: full    Dental no notable dental hx.    Pulmonary sleep apnea , former smoker   Pulmonary exam normal breath sounds clear to auscultation       Cardiovascular Exercise Tolerance: Good hypertension, negative cardio ROS  Rhythm:regular Rate:Normal     Neuro/Psych  Headaches, Seizures -,  PSYCHIATRIC DISORDERS Anxiety Depression     Neuromuscular disease CVA    GI/Hepatic Neg liver ROS, hiatal hernia,GERD  ,,  Endo/Other  negative endocrine ROS    Renal/GU negative Renal ROS  negative genitourinary   Musculoskeletal   Abdominal   Peds  Hematology negative hematology ROS (+)   Anesthesia Other Findings   Reproductive/Obstetrics negative OB ROS                              Anesthesia Physical Anesthesia Plan  ASA: 3  Anesthesia Plan: MAC   Post-op Pain Management:    Induction:   PONV Risk Score and Plan: Propofol  infusion  Airway Management Planned:   Additional Equipment:   Intra-op Plan:   Post-operative Plan:   Informed Consent: I have reviewed the patients History and Physical, chart, labs and discussed the procedure including the risks, benefits and alternatives for the proposed anesthesia with the patient or authorized representative who has indicated his/her understanding and acceptance.     Dental Advisory Given  Plan Discussed with: CRNA  Anesthesia Plan Comments:         Anesthesia Quick Evaluation

## 2024-09-14 DIAGNOSIS — F411 Generalized anxiety disorder: Secondary | ICD-10-CM | POA: Diagnosis not present

## 2024-09-14 DIAGNOSIS — F603 Borderline personality disorder: Secondary | ICD-10-CM | POA: Diagnosis not present

## 2024-09-14 DIAGNOSIS — R0989 Other specified symptoms and signs involving the circulatory and respiratory systems: Secondary | ICD-10-CM | POA: Diagnosis not present

## 2024-09-14 DIAGNOSIS — F1290 Cannabis use, unspecified, uncomplicated: Secondary | ICD-10-CM | POA: Diagnosis not present

## 2024-09-14 DIAGNOSIS — F4312 Post-traumatic stress disorder, chronic: Secondary | ICD-10-CM | POA: Diagnosis not present

## 2024-09-14 DIAGNOSIS — F331 Major depressive disorder, recurrent, moderate: Secondary | ICD-10-CM | POA: Diagnosis not present

## 2024-09-17 ENCOUNTER — Encounter (HOSPITAL_COMMUNITY): Payer: Self-pay | Admitting: Internal Medicine

## 2024-09-19 ENCOUNTER — Ambulatory Visit (INDEPENDENT_AMBULATORY_CARE_PROVIDER_SITE_OTHER)

## 2024-09-19 VITALS — BP 115/81 | HR 69 | Temp 98.0°F | Ht 63.0 in | Wt 171.0 lb

## 2024-09-19 DIAGNOSIS — H9191 Unspecified hearing loss, right ear: Secondary | ICD-10-CM | POA: Diagnosis not present

## 2024-09-19 DIAGNOSIS — H6991 Unspecified Eustachian tube disorder, right ear: Secondary | ICD-10-CM | POA: Diagnosis not present

## 2024-09-19 MED ORDER — MOMETASONE FUROATE 50 MCG/ACT NA SUSP: 2.0000 | Freq: Every day | NASAL | 12 refills | Status: AC

## 2024-09-19 NOTE — Progress Notes (Signed)
 HPI:  Discussed the use of AI scribe software for clinical note transcription with the patient, who gave verbal consent to proceed.  History of Present Illness Mary Parker is a 43 year old female with a history of ear infections as a child who presents with right ear discomfort and drainage.  She began experiencing symptoms around the end of August to September, describing a 'big squealing' and 'itchy like something's moving' sensation in her right ear. Persistent drainage has been present since the onset. Despite two rounds of antibiotics and steroids, the symptoms persist. She is currently using DayQuil and NyQuil for symptom management.  She has a history of ear infections and ear issues since childhood. She denies any drainage from the ear during this episode of discomfort.  She has not used nasal sprays as she states she is unable to tolerate the taste when it goes down her throat describing a gag reflex and nausea when attempting to use them.   She reports a decrease in hearing, requiring her to increase the volume on devices. The hearing loss is overall but more pronounced in the right ear. She recalls a specific incident in September where blowing her nose caused a 'pig squeal and pain' in her right ear, prompting a visit to urgent care.  She was tested for strep, flu, and COVID-19 at the onset of her symptoms, all of which were negative. She describes a sensation of plugged ears and hearing loss during this period.      PMH/Meds/All/SocHx/FamHx/ROS: Past Medical History:  Diagnosis Date   Anxiety    Arthritis    both knees   Bell's palsy    migraine induced   Body aches 12/11/2014   Chronic insomnia    Chronic low back pain 05/29/2015   Common migraine 12/19/2014   migraine induced stroke   Depression 12/11/2014   Fibrocystic disease of both breasts    Fibromyalgia 12/19/2014   GERD (gastroesophageal reflux disease)    History of hiatal hernia    HSIL (high grade  squamous intraepithelial lesion) on Pap smear of cervix 11/09/2021   11/09/21 needs colposcopy   IBS (irritable bowel syndrome)    Knee pain, right    Migraines    Rebound headache 12/19/2014   Right ovarian cyst 04/25/2017   Complex, check CA 125 and repeat US  in 6 weeks    Sciatica of left side    Seizures (HCC)    remote past, felt it was related to Chantix     Sleep apnea    Stroke [facility])    migraine induced stroke   Vitamin D deficiency    Wears glasses    Past Surgical History:  Procedure Laterality Date   BILATERAL SALPINGECTOMY Bilateral 06/21/2017   Procedure: BILATERAL SALPINGECTOMY;  Surgeon: Edsel Norleen GAILS, MD;  Location: [facility];  Service: Gynecology;  Laterality: Bilateral;   CESAREAN SECTION  1999,2000,2004   CHOLECYSTECTOMY  2001   COLONOSCOPY N/A 09/13/2024   Procedure: COLONOSCOPY;  Surgeon: Shaaron Lamar HERO, MD;  Location: [facility];  Service: Endoscopy;  Laterality: N/A;  8:15 AM, ASA 2   COLONOSCOPY WITH PROPOFOL  N/A 04/07/2017   Dr. Thersia: Normal, abnormal CT likely artifactual   ENDOMETRIAL ABLATION     ESOPHAGOGASTRODUODENOSCOPY  2002   [facility]: normal   KNEE ARTHROSCOPY WITH LATERAL RELEASE Right 04/18/2018   Procedure: Right knee arthroscopy with chondroplasty and lateral release;  Surgeon: Sharl Selinda Dover, MD;  Location[facility];  Service: Orthopedics;  Laterality: Right;  60 mins   LASER ABLATION CONDOLAMATA N/A 12/30/2021   Procedure: LASER ABLATION of the cervix;  Surgeon: Jayne Vonn DEL, MD;  Location: [facility];  Service: Gynecology;  Laterality: N/A;   MOUTH BIOPSY     MOUTH SURGERY     MULTIPLE TOOTH EXTRACTIONS     03/24/2023   SUPRACERVICAL ABDOMINAL HYSTERECTOMY N/A 06/21/2017   Procedure: HYSTERECTOMY SUPRACERVICAL ABDOMINAL;  Surgeon: Edsel Norleen GAILS, MD;  Location: [facility];  Service: Gynecology;  Laterality: N/A;   TUBAL LIGATION     No family history of bleeding disorders, wound healing problems or difficulty  with anesthesia.  Social Connections: Somewhat Isolated (05/03/2024)   Received from [facility]   Social Network    How would you rate your social network (family, work, friends)?: Restricted participation with some degree of social isolation    Current Outpatient Medications:    ALPRAZolam  (XANAX ) 1 MG tablet, Take 1 mg by mouth 2 (two) times daily., Disp: , Rfl:    budesonide-formoterol (SYMBICORT) 160-4.5 MCG/ACT inhaler, Inhale 2 puffs into the lungs 2 (two) times daily as needed (for flares)., Disp: , Rfl:    cyanocobalamin  (,VITAMIN B-12,) 1000 MCG/ML injection, Inject 1,000 mcg into the skin every 30 (thirty) days. , Disp: , Rfl: 0   dicyclomine  (BENTYL ) 10 MG capsule, Take 1 capsule (10 mg total) by mouth 4 (four) times daily -  before meals and at bedtime. For looser stool and cramping. Stop if constipation, as this can cause worsening constipation., Disp: 120 capsule, Rfl: 1   hydrOXYzine (ATARAX) 50 MG tablet, Take 50 mg by mouth at bedtime., Disp: , Rfl:    methocarbamol  (ROBAXIN ) 750 MG tablet, Take 750 mg by mouth at bedtime., Disp: , Rfl:    omeprazole (PRILOSEC) 20 MG capsule, Take 20 mg by mouth daily., Disp: , Rfl:    ondansetron  (ZOFRAN -ODT) 4 MG disintegrating tablet, Take 1 tablet (4 mg total) by mouth every 8 (eight) hours as needed for nausea or vomiting., Disp: 20 tablet, Rfl: 0   sertraline  (ZOLOFT ) 100 MG tablet, Take 100 mg by mouth daily., Disp: , Rfl:    Ubrogepant  (UBRELVY ) 100 MG TABS, Take 1 tablet (100 mg total) by mouth as needed (take 1 tablet at onset of headache, may repeat 2 hours after initial dose, max is 200 mg/24 hours)., Disp: 12 tablet, Rfl: 11 A complete ROS was performed with pertinent positives/negatives noted in the HPI. The remainder of the ROS are negative.   Physical Exam:  BP 115/81 (BP Location: Right Arm, Patient Position: Sitting, Cuff Size: Normal)   Pulse 69   Temp 98 F (36.7 C)   Ht 5' 3 (1.6 m)   Wt 171 lb (77.6 kg)   SpO2 97%    BMI 30.29 kg/m  General: Well developed, well nourished. No acute distress.  Head/Face: Normocephalic. No sinus tenderness. Facial nerve intact and equal bilaterally. No facial lacerations. Eyes: PERRL, no scleral icterus or conjunctival hemorrhage. EOMI. Ears: No gross deformity. Normal external canal. Tympanic membrane in tact bilaterally Hearing: Normal speech reception.  Nose: No gross deformity or lesions. Mucoid discharge bilaterally. Mild turbinate hypertrophy. Mouth/Oropharynx: Lips without any lesions. No mucosal lesions within the oropharynx. No tonsillar enlargement, exudate, or lesions. Pharyngeal walls symmetrical. Uvula midline. Tongue midline without lesions. Larynx: See TFL if applicable Nasopharynx: See TFL if applicable Neck: Trachea midline. No masses. No thyromegaly or nodules palpated. No crepitus. Lymphatic: No lymphadenopathy in the neck. Respiratory: No stridor or distress.  Room air. Cardiovascular: Regular rate and rhythm. Extremities: No edema or cyanosis. Warm and well-perfused. Skin: No scars or lesions on face or neck. Neurologic: CN II-XII grossly intact. Moving all extremities without gross abnormality. Other:  Independent Review of Additional Tests or Records: None Procedures: Diagnostic Nasal Endoscopy  Pre-procedure diagnosis: Concern for eustachian tube dysfunction Post-procedure diagnosis: same Indication: See pre-procedure diagnosis and physical exam above Complications: None apparent Anesthesia: Lidocaine  4% and topical decongestant was topically sprayed in each nasal cavity  Description of Procedure:  Patient was identified. A flexible endoscope was utilized to evaluate the sinonasal cavities, mucosa, sinus ostia and turbinates and septum.  Overall, signs of mucosal inflammation are noted. No mucopurulence, polyps, or masses noted.   Right eustachian tube orifice - swelling noted at torus tubarius without any ulceration or mass Left eustachian  tube orifice - patent with mild swelling  CPT CODE -- 31231 - Mod 25    Impression & Plans: Assessment & Plan Right ear symptoms with hearing loss and drainage due to suspected Eustachian tube dysfunction - Audiogram ordered - Performed nasal endoscopy to evaluate Eustachian tube - Start Nasonex  daily - Discussed potential ear tube placement if symptoms persist  Follow-up in 4-6 weeks after audiogram  Adah Malkin, DO [facility]

## 2024-09-21 DIAGNOSIS — F603 Borderline personality disorder: Secondary | ICD-10-CM | POA: Diagnosis not present

## 2024-09-21 DIAGNOSIS — F4312 Post-traumatic stress disorder, chronic: Secondary | ICD-10-CM | POA: Diagnosis not present

## 2024-09-21 DIAGNOSIS — F331 Major depressive disorder, recurrent, moderate: Secondary | ICD-10-CM | POA: Diagnosis not present

## 2024-09-21 DIAGNOSIS — F1290 Cannabis use, unspecified, uncomplicated: Secondary | ICD-10-CM | POA: Diagnosis not present

## 2024-09-24 LAB — GENECONNECT MOLECULAR SCREEN: Genetic Analysis Overall Interpretation: NEGATIVE

## 2024-09-25 DIAGNOSIS — F1290 Cannabis use, unspecified, uncomplicated: Secondary | ICD-10-CM | POA: Diagnosis not present

## 2024-09-25 DIAGNOSIS — F4312 Post-traumatic stress disorder, chronic: Secondary | ICD-10-CM | POA: Diagnosis not present

## 2024-09-25 DIAGNOSIS — F603 Borderline personality disorder: Secondary | ICD-10-CM | POA: Diagnosis not present

## 2024-09-25 DIAGNOSIS — F331 Major depressive disorder, recurrent, moderate: Secondary | ICD-10-CM | POA: Diagnosis not present

## 2024-10-05 DIAGNOSIS — F4312 Post-traumatic stress disorder, chronic: Secondary | ICD-10-CM | POA: Diagnosis not present

## 2024-10-05 DIAGNOSIS — F603 Borderline personality disorder: Secondary | ICD-10-CM | POA: Diagnosis not present

## 2024-10-05 DIAGNOSIS — F331 Major depressive disorder, recurrent, moderate: Secondary | ICD-10-CM | POA: Diagnosis not present

## 2024-10-05 DIAGNOSIS — F1290 Cannabis use, unspecified, uncomplicated: Secondary | ICD-10-CM | POA: Diagnosis not present

## 2024-10-09 DIAGNOSIS — M7918 Myalgia, other site: Secondary | ICD-10-CM | POA: Diagnosis not present

## 2024-10-19 DIAGNOSIS — F331 Major depressive disorder, recurrent, moderate: Secondary | ICD-10-CM | POA: Diagnosis not present

## 2024-10-19 DIAGNOSIS — F1290 Cannabis use, unspecified, uncomplicated: Secondary | ICD-10-CM | POA: Diagnosis not present

## 2024-10-19 DIAGNOSIS — F603 Borderline personality disorder: Secondary | ICD-10-CM | POA: Diagnosis not present

## 2024-10-19 DIAGNOSIS — F4312 Post-traumatic stress disorder, chronic: Secondary | ICD-10-CM | POA: Diagnosis not present

## 2024-10-23 ENCOUNTER — Ambulatory Visit (INDEPENDENT_AMBULATORY_CARE_PROVIDER_SITE_OTHER): Admitting: Audiology

## 2024-10-23 ENCOUNTER — Ambulatory Visit (INDEPENDENT_AMBULATORY_CARE_PROVIDER_SITE_OTHER)

## 2024-11-08 ENCOUNTER — Encounter (HOSPITAL_COMMUNITY): Payer: Self-pay

## 2024-11-08 ENCOUNTER — Emergency Department (HOSPITAL_COMMUNITY)
Admission: EM | Admit: 2024-11-08 | Discharge: 2024-11-09 | Discharge status: Against Medical Advice | Attending: Emergency Medicine | Admitting: Emergency Medicine

## 2024-11-08 ENCOUNTER — Other Ambulatory Visit: Payer: Self-pay

## 2024-11-08 ENCOUNTER — Encounter: Admitting: Gastroenterology

## 2024-11-08 DIAGNOSIS — Z5321 Procedure and treatment not carried out due to patient leaving prior to being seen by health care provider: Secondary | ICD-10-CM | POA: Diagnosis not present

## 2024-11-08 DIAGNOSIS — G43909 Migraine, unspecified, not intractable, without status migrainosus: Secondary | ICD-10-CM | POA: Insufficient documentation

## 2024-11-08 DIAGNOSIS — R350 Frequency of micturition: Secondary | ICD-10-CM | POA: Diagnosis present

## 2024-11-08 NOTE — ED Triage Notes (Addendum)
 Pt reports a migraine since New Year's Eve. No relief with ubrelvy .  Pt also reports urinary frequency.

## 2024-12-03 ENCOUNTER — Telehealth: Payer: Self-pay

## 2024-12-03 NOTE — Telephone Encounter (Signed)
 Pt was called due to weather 12-04-24, pt had 2 apts with audio and f/u with provider, pt was r/s to diff day and both sched same day

## 2024-12-04 ENCOUNTER — Ambulatory Visit (INDEPENDENT_AMBULATORY_CARE_PROVIDER_SITE_OTHER)

## 2024-12-04 ENCOUNTER — Ambulatory Visit (INDEPENDENT_AMBULATORY_CARE_PROVIDER_SITE_OTHER): Admitting: Audiology

## 2024-12-20 ENCOUNTER — Encounter: Admitting: Gastroenterology

## 2025-01-07 ENCOUNTER — Ambulatory Visit (INDEPENDENT_AMBULATORY_CARE_PROVIDER_SITE_OTHER): Admitting: Audiology

## 2025-01-07 ENCOUNTER — Ambulatory Visit (INDEPENDENT_AMBULATORY_CARE_PROVIDER_SITE_OTHER)
